# Patient Record
Sex: Male | Born: 1945 | Race: White | Hispanic: No | Marital: Married | Smoking: Current every day smoker
Health system: Southern US, Community
[De-identification: ages and names within clinical notes are randomized; demographics above are authoritative.]

## PROBLEM LIST (undated history)

## (undated) DIAGNOSIS — I10 Essential (primary) hypertension: Secondary | ICD-10-CM

## (undated) DIAGNOSIS — E1142 Type 2 diabetes mellitus with diabetic polyneuropathy: Secondary | ICD-10-CM

## (undated) DIAGNOSIS — N529 Male erectile dysfunction, unspecified: Secondary | ICD-10-CM

## (undated) DIAGNOSIS — I509 Heart failure, unspecified: Secondary | ICD-10-CM

## (undated) DIAGNOSIS — I517 Cardiomegaly: Secondary | ICD-10-CM

## (undated) DIAGNOSIS — J189 Pneumonia, unspecified organism: Secondary | ICD-10-CM

## (undated) DIAGNOSIS — J310 Chronic rhinitis: Secondary | ICD-10-CM

## (undated) DIAGNOSIS — E119 Type 2 diabetes mellitus without complications: Secondary | ICD-10-CM

## (undated) DIAGNOSIS — R7989 Other specified abnormal findings of blood chemistry: Secondary | ICD-10-CM

## (undated) DIAGNOSIS — E785 Hyperlipidemia, unspecified: Secondary | ICD-10-CM

## (undated) DIAGNOSIS — I420 Dilated cardiomyopathy: Secondary | ICD-10-CM

## (undated) DIAGNOSIS — R002 Palpitations: Secondary | ICD-10-CM

## (undated) DIAGNOSIS — K5900 Constipation, unspecified: Secondary | ICD-10-CM

## (undated) DIAGNOSIS — I2510 Atherosclerotic heart disease of native coronary artery without angina pectoris: Secondary | ICD-10-CM

## (undated) HISTORY — DX: Heart failure, unspecified: I50.9

## (undated) HISTORY — DX: Hyperlipidemia, unspecified: E78.5

## (undated) HISTORY — DX: Type 2 diabetes mellitus without complications: E11.9

## (undated) HISTORY — DX: Essential (primary) hypertension: I10

## (undated) HISTORY — PX: PILONIDAL CYST EXCISION: SHX744

## (undated) HISTORY — PX: APPENDECTOMY: SHX54

## (undated) HISTORY — DX: Chronic rhinitis: J31.0

## (undated) HISTORY — DX: Constipation, unspecified: K59.00

## (undated) HISTORY — PX: TONSILLECTOMY: SUR1361

---

## 1993-10-12 HISTORY — PX: URETHRA SURGERY: SHX824

## 1996-10-12 HISTORY — PX: HERNIA REPAIR: SHX51

## 2008-08-17 ENCOUNTER — Ambulatory Visit: Payer: Self-pay

## 2011-01-20 ENCOUNTER — Ambulatory Visit: Payer: Self-pay | Admitting: Internal Medicine

## 2011-02-10 ENCOUNTER — Ambulatory Visit: Payer: Self-pay | Admitting: Internal Medicine

## 2014-06-08 DIAGNOSIS — I10 Essential (primary) hypertension: Secondary | ICD-10-CM | POA: Insufficient documentation

## 2014-06-08 DIAGNOSIS — E119 Type 2 diabetes mellitus without complications: Secondary | ICD-10-CM | POA: Insufficient documentation

## 2014-06-08 DIAGNOSIS — I152 Hypertension secondary to endocrine disorders: Secondary | ICD-10-CM | POA: Insufficient documentation

## 2014-06-08 DIAGNOSIS — E785 Hyperlipidemia, unspecified: Secondary | ICD-10-CM | POA: Insufficient documentation

## 2014-06-08 DIAGNOSIS — E669 Obesity, unspecified: Secondary | ICD-10-CM | POA: Insufficient documentation

## 2014-06-08 DIAGNOSIS — E1169 Type 2 diabetes mellitus with other specified complication: Secondary | ICD-10-CM

## 2014-06-08 DIAGNOSIS — E1159 Type 2 diabetes mellitus with other circulatory complications: Secondary | ICD-10-CM

## 2014-06-08 HISTORY — DX: Essential (primary) hypertension: I10

## 2015-07-10 ENCOUNTER — Ambulatory Visit (INDEPENDENT_AMBULATORY_CARE_PROVIDER_SITE_OTHER): Payer: Medicare Other | Admitting: Urology

## 2015-07-10 ENCOUNTER — Encounter: Payer: Self-pay | Admitting: Urology

## 2015-07-10 VITALS — BP 159/98 | HR 92 | Ht 71.0 in | Wt 232.6 lb

## 2015-07-10 DIAGNOSIS — R3129 Other microscopic hematuria: Secondary | ICD-10-CM | POA: Insufficient documentation

## 2015-07-10 DIAGNOSIS — R972 Elevated prostate specific antigen [PSA]: Secondary | ICD-10-CM | POA: Insufficient documentation

## 2015-07-10 DIAGNOSIS — R312 Other microscopic hematuria: Secondary | ICD-10-CM

## 2015-07-10 NOTE — Progress Notes (Signed)
07/10/2015 4:19 PM   Casey Peters Apr 05, 1946 [id number]  Referring provider: No referring provider defined for this encounter.  Chief Complaint  Patient presents with  . Elevated PSA    referred by Yates Decamp MD    HPI: Patient is a 69 year old white male who is referred by his PCP, Dr. Dan Humphreys for a rising PSA.  Patient is also hypogonadal and is currently on Testim.  His PSA was 1.06 ng/mL was 06/08/2014 and now it has risen to 3.17 ng/mL on 06/27/2015.   Patient has filled out and I PSS score sheet and has scored a 28/4. When I asked him how long he's been having these urinary symptoms, he stated that ever since his last urethral reconstruction surgery 22 years ago in [place].    He complains of frequent urination, urgency nocturia, leakage, hesitancy and a weak urinary stream.   He has not had any gross hematuria, but he states he's been told he's had blood in his urine under the microscope. When I reviewed the PCP's records, he did have 4-10 RBC's per high-power field on his visit on 07/02/2015.  He is a former smoker. He does not have a history of kidney stones, UTI's or GU malignancies.  He denies any family history of prostate cancer, bladder cancer or kidney cancer.  He is currently not experiencing dysuria or suprapubic pain. He also denies any fevers, chills, nausea or vomiting. He has not had any bone pain, weight loss or appetite loss.      IPSS      07/10/15 1400       International Prostate Symptom Score   How often have you had the sensation of not emptying your bladder? Almost always     How often have you had to urinate less than every two hours? Almost always     How often have you found you stopped and started again several times when you urinated? Almost always     How often have you found it difficult to postpone urination? About half the time     How often have you had a weak urinary stream? Almost always     How often have you had to strain to  start urination? Not at All     How many times did you typically get up at night to urinate? 5 Times     Total IPSS Score 28     Quality of Life due to urinary symptoms   If you were to spend the rest of your life with your urinary condition just the way it is now how would you feel about that? Mostly Disatisfied        Score:  1-7 Mild 8-19 Moderate 20-35 Severe  PMH: Past Medical History  Diagnosis Date  . Diabetes   . HLD (hyperlipidemia)   . HTN (hypertension)   . Constipation   . Rhinitis     Surgical History: Past Surgical History  Procedure Laterality Date  . Urethra surgery  1995    Home Medications:    Medication List       This list is accurate as of: 07/10/15  4:19 PM.  Always use your most recent med list.               amLODipine 5 MG tablet  Commonly known as:  NORVASC  Take by mouth.     benazepril-hydrochlorthiazide 20-12.5 MG tablet  Commonly known as:  LOTENSIN HCT  Take 2 tablets  by mouth daily.     ciprofloxacin 250 MG tablet  Commonly known as:  CIPRO  Take by mouth.     CRESTOR 5 MG tablet  Generic drug:  rosuvastatin  TAKE 1 TABLET BY MOUTH ONCE A DAY     doxycycline 100 MG capsule  Commonly known as:  VIBRAMYCIN  TAKE 1 CAPSULE (100 MG TOTAL) BY MOUTH 2 (TWO) TIMES DAILY.     fenofibrate 160 MG tablet  TAKE 1 TABLET BY MOUTH ONCE A DAY     metFORMIN 500 MG tablet  Commonly known as:  GLUCOPHAGE  TAKE 1 TABLET BY MOUTH TWICE A DAY     metoprolol succinate 50 MG 24 hr tablet  Commonly known as:  TOPROL-XL  1 tab po daily for tremor     testosterone 50 MG/5GM (1%) Gel  Commonly known as:  ANDROGEL  Apply topically.        Allergies: No Known Allergies  Family History: Family History  Problem Relation Age of Onset  . Hematuria Father   . Kidney disease Neg Hx   . Prostate cancer Neg Hx     Social History:  reports that he has quit smoking. He does not have any smokeless tobacco history on file. He reports that  he does not drink alcohol or use illicit drugs.  ROS: UROLOGY Frequent Urination?: Yes Hard to postpone urination?: Yes Burning/pain with urination?: No Get up at night to urinate?: Yes Leakage of urine?: Yes Urine stream starts and stops?: Yes Trouble starting stream?: No Do you have to strain to urinate?: No Blood in urine?: No Urinary tract infection?: No Sexually transmitted disease?: No Injury to kidneys or bladder?: No Painful intercourse?: No Weak stream?: Yes Erection problems?: No Penile pain?: No  Gastrointestinal Nausea?: No Vomiting?: No Indigestion/heartburn?: No Diarrhea?: No Constipation?: Yes  Constitutional Fever: No Night sweats?: No Weight loss?: No Fatigue?: No  Skin Skin rash/lesions?: No Itching?: No  Eyes Blurred vision?: No Double vision?: No  Ears/Nose/Throat Sore throat?: No Sinus problems?: Yes  Hematologic/Lymphatic Swollen glands?: No Easy bruising?: No  Cardiovascular Leg swelling?: No Chest pain?: No  Respiratory Cough?: No Shortness of breath?: No  Endocrine Excessive thirst?: No  Musculoskeletal Back pain?: No Joint pain?: No  Neurological Headaches?: No Dizziness?: No  Psychologic Depression?: No Anxiety?: No  Physical Exam: BP 159/98 mmHg  Pulse 92  Ht  (1.803 m)  Wt 232 lb 9.6 oz (105.507 kg)  BMI 32.46 kg/m2  Constitutional:  Alert and oriented, No acute distress. HEENT: [place] AT, moist mucus membranes.  Trachea midline, no masses. Cardiovascular: No clubbing, cyanosis, or edema. Respiratory: Normal respiratory effort, no increased work of breathing. GI: Abdomen is soft, nontender, nondistended, no abdominal masses GU: No CVA tenderness. Patient with circumcised phallus.   Patient has had multiple urethral reconstructions.  No penile discharge. No penile lesions or rashes. Scrotum without lesions, cysts, rashes and/or edema.  Testicles are located scrotally bilaterally. No masses are  appreciated in the testicles. Left and right epididymis are normal. Rectal: Patient with  normal sphincter tone. Perineum without scarring or rashes. No rectal masses are appreciated. Prostate is approximately 45 grams, no nodules are appreciated. Seminal vesicles are normal. Skin: No rashes, bruises or suspicious lesions. Lymph: No cervical or inguinal adenopathy. Neurologic: Grossly intact, no focal deficits, moving all 4 extremities. Psychiatric: Normal mood and affect.  Laboratory Data:  Serum creatinine:   1.0 on 07/02/2015   Assessment & Plan:    1. Rising  PSA:   Patient's PSA has risen from 1.06 ng/mL on 06/08/2014 to 3.17 ng/mL on 06/27/2015.  We will repeat the PSA today.  If it remains elevated, we will pursue a biopsy.  This is a precipitous rise in a short period of time.  Patient is also on testosterone therapy and I have asked him to discontinue the Testim for now.  I did discuss the risks involved with a prostate biopsy, such as blood in urine, blood in stool, blood in semen, infection, urinary retention, and on rare occasions sepsis and death.    - PSA  2. Microscopic hematuria:   Patient had 4-10 RBC's/hpf at his PCP's office on 07/02/2015.  Explained to patient the causes of blood in the urine are as follows: stones, BPH, UTI's, damage to the urinary tract and/or cancer.   It is explained to the patient that they will be scheduled for a CT Urogram with contrast material and that in rare instances, an allergic reaction can be serious and even life threatening with the injection of contrast material.   The patient denies any allergies to contrast, iodine and/or seafood and is taking metformin.  Return for pending labs.  Michiel Cowboy, PA-C  [facility] [address] [place], [place] [address] [phone]

## 2015-07-11 ENCOUNTER — Telehealth: Payer: Self-pay

## 2015-07-11 LAB — PSA: Prostate Specific Ag, Serum: 2.1 ng/mL (ref 0.0–4.0)

## 2015-07-11 NOTE — Telephone Encounter (Signed)
-----   Message from Harle Battiest, PA-C sent at 07/11/2015  8:36 AM EDT ----- Patient's PSA has decreased, but it is still greater than a .75 rise within a year.  He can either have a biopsy at this time, especially if he wants to continue testosterone therapy, or repeat the PSA in three months and not apply the testosterone cream during that time.  He still needs the CT Urogram for the hematuria.

## 2015-07-11 NOTE — Telephone Encounter (Signed)
LMOM

## 2015-07-12 NOTE — Telephone Encounter (Signed)
-----   Message from Shannon A McGowan, PA-C sent at 07/11/2015  8:36 AM EDT ----- Patient's PSA has decreased, but it is still greater than a .75 rise within a year.  He can either have a biopsy at this time, especially if he wants to continue testosterone therapy, or repeat the PSA in three months and not apply the testosterone cream during that time.  He still needs the CT Urogram for the hematuria. 

## 2015-07-12 NOTE — Telephone Encounter (Signed)
LMOM

## 2015-07-15 ENCOUNTER — Telehealth: Payer: Self-pay | Admitting: *Deleted

## 2015-07-15 NOTE — Telephone Encounter (Signed)
Patient notified of results and has agreed to go ahead with prostate biopsy. He is going on a cruise and will be scheduled when he gets back. Patient was instructed not to restart testosterone at this time and would need to await biopsy results. He was given prostate biopsy instructions and verbalized understanding.  He will keep his CTscan apt for Friday

## 2015-07-15 NOTE — Telephone Encounter (Signed)
-----   Message from Shannon A McGowan, PA-C sent at 07/11/2015  8:36 AM EDT ----- Patient's PSA has decreased, but it is still greater than a .75 rise within a year.  He can either have a biopsy at this time, especially if he wants to continue testosterone therapy, or repeat the PSA in three months and not apply the testosterone cream during that time.  He still needs the CT Urogram for the hematuria. 

## 2015-07-15 NOTE — Telephone Encounter (Signed)
Pt returned Chelsea's call re: PSA results. Please return his call at [phone].

## 2015-07-19 ENCOUNTER — Ambulatory Visit
Admission: RE | Admit: 2015-07-19 | Discharge: 2015-07-19 | Disposition: A | Discharge status: Home / Self Care | Payer: Medicare Other | Source: Ambulatory Visit | Attending: Urology | Admitting: Urology

## 2015-07-19 DIAGNOSIS — R3129 Other microscopic hematuria: Secondary | ICD-10-CM | POA: Diagnosis present

## 2015-07-19 DIAGNOSIS — K760 Fatty (change of) liver, not elsewhere classified: Secondary | ICD-10-CM | POA: Insufficient documentation

## 2015-07-19 MED ORDER — IOHEXOL 300 MG/ML  SOLN
150.0000 mL | Freq: Once | INTRAMUSCULAR | Status: AC | PRN
  Administered 2015-07-19: 125 mL via INTRAVENOUS

## 2015-07-31 ENCOUNTER — Encounter: Payer: Self-pay | Admitting: Urology

## 2015-07-31 ENCOUNTER — Ambulatory Visit: Payer: Medicare Other | Admitting: Urology

## 2015-08-06 ENCOUNTER — Telehealth: Payer: Self-pay

## 2015-08-06 NOTE — Telephone Encounter (Signed)
Spoke with pt in reference to CT urogram and prostate bx. Pt stated he had his CT urogram a couple of weeks ago and then he has his prostate bx scheduled for Nov 1. -- Pt requested you give him a call.

## 2015-08-06 NOTE — Telephone Encounter (Signed)
-----   Message from Harle BattiestShannon A McGowan, PA-C sent at 08/06/2015 10:14 AM EDT ----- Patient has not had his CT urogram report nor has he scheduled his prostate biopsy.  Please make sure he does so.

## 2015-08-13 ENCOUNTER — Encounter: Payer: Self-pay | Admitting: Urology

## 2015-08-13 ENCOUNTER — Ambulatory Visit (INDEPENDENT_AMBULATORY_CARE_PROVIDER_SITE_OTHER): Payer: Medicare Other | Admitting: Urology

## 2015-08-13 ENCOUNTER — Other Ambulatory Visit: Payer: Self-pay | Admitting: Urology

## 2015-08-13 VITALS — BP 147/93 | HR 98 | Ht 71.0 in | Wt 229.9 lb

## 2015-08-13 DIAGNOSIS — R972 Elevated prostate specific antigen [PSA]: Secondary | ICD-10-CM | POA: Diagnosis not present

## 2015-08-13 MED ORDER — LEVOFLOXACIN 500 MG PO TABS
500.0000 mg | ORAL_TABLET | Freq: Once | ORAL | Status: AC
  Administered 2015-08-13: 500 mg via ORAL

## 2015-08-13 MED ORDER — GENTAMICIN SULFATE 40 MG/ML IJ SOLN
80.0000 mg | Freq: Once | INTRAMUSCULAR | Status: AC
  Administered 2015-08-13: 80 mg via INTRAMUSCULAR

## 2015-08-13 NOTE — Progress Notes (Signed)
Prostate Biopsy Procedure   Informed consent was obtained after discussing risks/benefits of the procedure.  A time out was performed to ensure correct patient identity. She's Pre-Procedure: - Last PSA Level: 2.1  - Gentamicin given prophylactically - Levaquin 500 mg administered PO -Transrectal Ultrasound performed revealing a 42 gm prostate -No significant hypoechoic or median lobe noted  Procedure: - Prostate block performed using 10 cc 1% lidocaine and biopsies taken from sextant areas, a total of 12 under ultrasound guidance.  Post-Procedure: - Patient tolerated the procedure well - He was 2 to seek immediate medical attention if experiences any severe pain, significant bleeding, or fevers - Return in one week to discuss biopsy results

## 2015-08-19 LAB — PATHOLOGY REPORT

## 2015-08-20 NOTE — Telephone Encounter (Signed)
-----   Message from Harle BattiestShannon A McGowan, PA-C sent at 08/20/2015 11:51 AM EST ----- Patient has not yet received his CT urogram report and he still needs to have cystoscopy to complete his hematuria workup.  He is scheduled to come in on the 10 th for his biopsy result.

## 2015-08-21 ENCOUNTER — Ambulatory Visit: Payer: Medicare Other

## 2015-08-22 ENCOUNTER — Ambulatory Visit (INDEPENDENT_AMBULATORY_CARE_PROVIDER_SITE_OTHER): Payer: Medicare Other | Admitting: Urology

## 2015-08-22 ENCOUNTER — Other Ambulatory Visit: Payer: Self-pay | Admitting: Urology

## 2015-08-22 ENCOUNTER — Encounter: Payer: Self-pay | Admitting: Urology

## 2015-08-22 VITALS — BP 137/83 | HR 91 | Temp 97.7°F | Ht 71.0 in | Wt 230.2 lb

## 2015-08-22 DIAGNOSIS — R3129 Other microscopic hematuria: Secondary | ICD-10-CM | POA: Diagnosis not present

## 2015-08-22 DIAGNOSIS — R3915 Urgency of urination: Secondary | ICD-10-CM | POA: Diagnosis not present

## 2015-08-22 LAB — URINALYSIS, COMPLETE
Bilirubin, UA: NEGATIVE
Glucose, UA: NEGATIVE
Ketones, UA: NEGATIVE
Nitrite, UA: NEGATIVE
Specific Gravity, UA: 1.02 (ref 1.005–1.030)
Urobilinogen, Ur: 0.2 mg/dL (ref 0.2–1.0)
pH, UA: 7 (ref 5.0–7.5)

## 2015-08-22 LAB — MICROSCOPIC EXAMINATION
RBC, UA: NONE SEEN /hpf (ref 0–?)
WBC, UA: >30 /hpf — ABNORMAL HIGH (ref 0–?)

## 2015-08-22 LAB — BLADDER SCAN AMB NON-IMAGING: Scan Result: 56

## 2015-08-22 MED ORDER — LIDOCAINE HCL 2 % EX GEL: 1.0000 "application " | Freq: Once | CUTANEOUS | Status: DC

## 2015-08-22 MED ORDER — CIPROFLOXACIN HCL 500 MG PO TABS: 500.0000 mg | ORAL_TABLET | Freq: Two times a day (BID) | ORAL | Status: DC

## 2015-08-22 MED ORDER — CIPROFLOXACIN HCL 500 MG PO TABS: 500.0000 mg | ORAL_TABLET | Freq: Once | ORAL | Status: DC

## 2015-08-22 NOTE — Progress Notes (Signed)
08/22/2015 12:03 PM   Casey Peters [birth year] [id number]  Referring provider: Rafael Bihari, MD [phone] [facility] [address] [facility] [place], [place] [address]  Chief Complaint  Patient presents with  . Cysto    microscopic hematuria, discuss biopsy results    HPI: Patient is a 69 year old white male who is referred by his PCP, Dr. Dan Humphreys for a rising PSA. Patient is also hypogonadal and is currently on Testim. His PSA was 1.06 ng/mL was 06/08/2014 and now it has risen to 3.17 ng/mL on 06/27/2015. Patient has filled out and I PSS score sheet and has scored a 28/4. When I asked him how long he's been having these urinary symptoms, he stated that ever since his last urethral reconstruction surgery 22 years ago in [place].   He complains of frequent urination, urgency nocturia, leakage, hesitancy and a weak urinary stream. He has not had any gross hematuria, but he states he's been told he's had blood in his urine under the microscope. When I reviewed the PCP's records, he did have 4-10 RBC's per high-power field on his visit on 07/02/2015. He is a former smoker. He does not have a history of kidney stones, UTI's or GU malignancies.  He denies any family history of prostate cancer, bladder cancer or kidney cancer. He is currently not experiencing dysuria or suprapubic pain. He also denies any fevers, chills, nausea or vomiting. He has not had any bone pain, weight loss or appetite loss.  Interval history: Patient presents today for review of his prostate biopsy results which were normal. He had a CT urogram which was unremarkable. He presents today for a cystoscopy to finish his microhematuria workup. However, he complains of new onset dysuria and frequency. His urinalysis has multiple bacteria. The patient states that he gets urinary tract infections every month. Also on further questioning of his previous urethral reconstruction, it sounds like the patient had  distal urethral reconstruction decades ago. 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    PMH: Past Medical History  Diagnosis Date  . Diabetes (HCC)   . HLD (hyperlipidemia)   . HTN (hypertension)   . Constipation   . Rhinitis   . BP (high blood pressure) 06/08/2014    Surgical History: Past Surgical History  Procedure Laterality Date  . Urethra surgery  1995    Home Medications:    Medication List       This list is accurate as of: 08/22/15 12:03 PM.  Always use your most recent med list.               amLODipine 5 MG tablet  Commonly known as:  NORVASC  Take by mouth.     benazepril-hydrochlorthiazide 20-12.5 MG tablet  Commonly known as:  LOTENSIN HCT  Take 2 tablets by mouth daily.     CRESTOR 5 MG tablet  Generic drug:  rosuvastatin  TAKE 1 TABLET BY MOUTH ONCE A DAY     doxycycline 100 MG capsule  Commonly known as:  VIBRAMYCIN  TAKE 1 CAPSULE (100 MG TOTAL) BY MOUTH 2 (TWO) TIMES DAILY.     fenofibrate 160 MG tablet  TAKE 1 TABLET BY MOUTH ONCE A DAY     metFORMIN 500 MG tablet  Commonly known as:  GLUCOPHAGE  TAKE 1 TABLET BY MOUTH TWICE A DAY     metoprolol succinate 50 MG 24 hr tablet  Commonly known  as:  TOPROL-XL  1 tab po daily for tremor     testosterone 50 MG/5GM (1%) Gel  Commonly known as:  ANDROGEL  Apply topically.        Allergies: No Known Allergies  Family History: Family History  Problem Relation Age of Onset  . Hematuria Father   . Kidney disease Neg Hx   . Prostate cancer Neg Hx     Social History:  reports that he has been smoking Cigars.  He does not have any smokeless tobacco history on file. He reports that he does not drink alcohol or use illicit drugs.  ROS: UROLOGY Frequent Urination?: Yes Hard to postpone urination?: Yes Burning/pain with urination?: Yes Get up at night to urinate?: Yes Leakage of urine?: No Urine stream starts and stops?:  Yes Trouble starting stream?: No Do you have to strain to urinate?: No Blood in urine?: No Urinary tract infection?: Yes Sexually transmitted disease?: No Injury to kidneys or bladder?: No Painful intercourse?: No Weak stream?: Yes Erection problems?: No Penile pain?: No  Gastrointestinal Nausea?: No Vomiting?: No Indigestion/heartburn?: No Diarrhea?: No Constipation?: Yes  Constitutional Fever: No Night sweats?: No Weight loss?: No Fatigue?: No  Skin Skin rash/lesions?: No Itching?: No  Eyes Blurred vision?: No Double vision?: No  Ears/Nose/Throat Sore throat?: No Sinus problems?: No  Hematologic/Lymphatic Swollen glands?: No Easy bruising?: No  Cardiovascular Leg swelling?: No Chest pain?: No  Respiratory Cough?: No Shortness of breath?: No  Endocrine Excessive thirst?: No  Musculoskeletal Back pain?: No Joint pain?: No  Neurological Headaches?: No Dizziness?: No  Psychologic Depression?: No Anxiety?: No  Physical Exam: BP 137/83 mmHg  Pulse 91  Temp(Src) 97.7 F (36.5 C) (Oral)  Ht  (1.803 m)  Wt 230 lb 3.2 oz (104.418 kg)  BMI 32.12 kg/m2  Constitutional:  Alert and oriented, No acute distress. HEENT: [place] AT, moist mucus membranes.  Trachea midline, no masses. Cardiovascular: No clubbing, cyanosis, or edema. Respiratory: Normal respiratory effort, no increased work of breathing. GI: Abdomen is soft, nontender, nondistended, no abdominal masses GU: No CVA tenderness.  Skin: No rashes, bruises or suspicious lesions. Lymph: No cervical or inguinal adenopathy. Neurologic: Grossly intact, no focal deficits, moving all 4 extremities. Psychiatric: Normal mood and affect.  Laboratory Data: No results found for: WBC, HGB, HCT, MCV, PLT  No results found for: CREATININE  Lab Results  Component Value Date   PSA 2.1 07/10/2015    No results found for: TESTOSTERONE  No results found for: HGBA1C  Urinalysis No results found  for: COLORURINE, APPEARANCEUR, LABSPEC, PHURINE, GLUCOSEU, HGBUR, BILIRUBINUR, KETONESUR, PROTEINUR, UROBILINOGEN, NITRITE, LEUKOCYTESUR  Pertinent Imaging: CLINICAL DATA: Microscopic hematuria intermittently for 1 year.  EXAM: CT ABDOMEN AND PELVIS WITHOUT AND WITH CONTRAST  TECHNIQUE: Multidetector CT imaging of the abdomen and pelvis was performed following the standard protocol before and following the bolus administration of intravenous contrast.  CONTRAST: OMNIPAQUE IOHEXOL 300 MG/ML SOLN  COMPARISON: None.  FINDINGS: Lower chest: The lung bases are clear of acute process. There is left basilar scarring and possible subpleural atelectasis. No worrisome pulmonary lesions or pleural effusion. The heart is normal in size. No pericardial effusion. The distal esophagus is grossly normal.  Hepatobiliary: Mild diffuse fatty infiltration of the liver but no focal hepatic lesions or intrahepatic biliary dilatation. The gallbladder is normal. No common bile duct dilatation.  Pancreas: No mass, inflammation or ductal dilatation.  Spleen: Normal size. No focal lesions.  Adrenals/Urinary Tract: The adrenal glands are normal.  No renal, ureteral or bladder calculi. Both kidneys demonstrate normal enhancement/ perfusion. No worrisome renal lesions. The delayed images do not demonstrate any significant collecting system abnormalities. Both ureters are normal. The bladder is normal. No bladder mass or wall thickening.  Scattered prostate calcifications. Mild median lobe hypertrophy impressing on the base of the bladder. The seminal vesicles are normal.  Stomach/Bowel: The stomach, duodenum, small bowel and colon are unremarkable. No inflammatory changes, mass lesions or obstructive findings.  Vascular/Lymphatic: No mesenteric or retroperitoneal mass or adenopathy. The aorta and branch vessels are patent. The major venous structures are patent.  Other:  No pelvic mass or adenopathy. Small scattered pelvic lymph nodes are noted. No free pelvic fluid collections. No inguinal mass or adenopathy.  Musculoskeletal: No significant bony findings. A few small scattered sclerotic lesions are likely benign bone islands.  IMPRESSION: 1. No CT findings to account for the patient's microhematuria. No renal, ureteral or bladder calculi or mass. 2. Mild median lobe hypertrophy of the prostate gland. 3. No acute abdominal/pelvic findings, mass lesions or lymphadenopathy. 4. Mild diffuse fatty infiltration of the liver.  PVR: 56 cc  Assessment & Plan:    1. Elevated PSA (rising whille on Testim) The patient had a normal biopsy of his prostate. He will follow-up in 6 months for PSA.  2. Microscopic hematuria  The patient has a negative CT urogram. However, today he is complaining of dysuria and has a large amount of bacteria in his urine. We will send a urine culture and have him follow-up for cystoscopy after confirming negative urine culture.  3.  Recurrent urinary tract infection We'll prophylactically start the patient on ciprofloxacin 500 mg twice a day for 1 week. If his culture grows no bacteria or gross bacteria resistant to Cipro, we will instruct the patient on changing or stopping the medication based on urine culture results  4. Hypogonadism The patient continue his treatment with Testim via his primary care provider.   Return in about 2 weeks (around 09/05/2015) for cystoscopy.  Hildred LaserBrian James Analie Katzman, MD  [facility] [address], [address] [place], [place] [id number] [phone]

## 2015-08-22 NOTE — Progress Notes (Signed)
Bladder Scan Patient void: 56 ml Performed By: Theotis BurrowK.Russell,CMA

## 2015-08-25 LAB — CULTURE, URINE COMPREHENSIVE

## 2015-08-28 ENCOUNTER — Other Ambulatory Visit: Payer: Medicare Other | Admitting: Urology

## 2015-08-29 ENCOUNTER — Telehealth: Payer: Self-pay

## 2015-08-29 DIAGNOSIS — N390 Urinary tract infection, site not specified: Secondary | ICD-10-CM

## 2015-08-29 MED ORDER — SULFAMETHOXAZOLE-TRIMETHOPRIM 800-160 MG PO TABS: 1.0000 | ORAL_TABLET | Freq: Two times a day (BID) | ORAL | Status: AC

## 2015-08-29 NOTE — Telephone Encounter (Signed)
Please start Casey Peters on Bactrim DS BID for a week and let him know his culture was resistant to cipro. Bb   LMOM- medication sent pharmacy

## 2015-08-29 NOTE — Telephone Encounter (Signed)
Pt notified to stop taking Cipro & start Bactrim. Prescription sent to pharmacy. Pt voices understanding.

## 2015-09-11 ENCOUNTER — Other Ambulatory Visit: Payer: Medicare Other

## 2015-09-11 ENCOUNTER — Ambulatory Visit (INDEPENDENT_AMBULATORY_CARE_PROVIDER_SITE_OTHER): Payer: Medicare Other | Admitting: Urology

## 2015-09-11 VITALS — BP 158/95 | HR 99 | Ht 71.0 in | Wt 232.0 lb

## 2015-09-11 DIAGNOSIS — R972 Elevated prostate specific antigen [PSA]: Secondary | ICD-10-CM

## 2015-09-11 DIAGNOSIS — R3129 Other microscopic hematuria: Secondary | ICD-10-CM | POA: Diagnosis not present

## 2015-09-11 DIAGNOSIS — N390 Urinary tract infection, site not specified: Secondary | ICD-10-CM | POA: Diagnosis not present

## 2015-09-11 LAB — URINALYSIS, COMPLETE
Bilirubin, UA: NEGATIVE
Ketones, UA: NEGATIVE
Nitrite, UA: NEGATIVE
Protein, UA: NEGATIVE
Specific Gravity, UA: 1.015 (ref 1.005–1.030)
Urobilinogen, Ur: 1 mg/dL (ref 0.2–1.0)
pH, UA: 7 (ref 5.0–7.5)

## 2015-09-11 LAB — MICROSCOPIC EXAMINATION

## 2015-09-11 MED ORDER — CIPROFLOXACIN HCL 500 MG PO TABS
500.0000 mg | ORAL_TABLET | Freq: Once | ORAL | Status: AC
  Administered 2015-09-11: 500 mg via ORAL

## 2015-09-11 MED ORDER — LIDOCAINE HCL 2 % EX GEL
1.0000 "application " | Freq: Once | CUTANEOUS | Status: AC
  Administered 2015-09-11: 1 via URETHRAL

## 2015-09-11 NOTE — Progress Notes (Signed)
09/11/2015 2:59 PM   Casey Peters 02/27/1946 [id number]  Referring provider: Rafael Bihari, MD [phone] [facility] [address] [facility] [place], [place] [address]  Chief Complaint  Patient presents with  . Cysto    HPI: Patient is a 69 year old white male who is referred by his PCP, Dr. Dan Humphreys for a rising PSA. Patient is also hypogonadal and is currently on Testim. His PSA was 1.06 ng/mL was 06/08/2014 and now it has risen to 3.17 ng/mL on 06/27/2015. Patient has filled out and I PSS score sheet and has scored a 28/4. When I asked him how long he's been having these urinary symptoms, he stated that ever since his last urethral reconstruction surgery 22 years ago in [place].   He complains of frequent urination, urgency nocturia, leakage, hesitancy and a weak urinary stream. He has not had any gross hematuria, but he states he's been told he's had blood in his urine under the microscope. When I reviewed the PCP's records, he did have 4-10 RBC's per high-power field on his visit on 07/02/2015. He is a former smoker. He does not have a history of kidney stones, UTI's or GU malignancies.  He denies any family history of prostate cancer, bladder cancer or kidney cancer. He is currently not experiencing dysuria or suprapubic pain. He also denies any fevers, chills, nausea or vomiting. He has not had any bone pain, weight loss or appetite loss.  Interval history: Patient presents today for completion of his microhematuria work up. He had a CT urogram which was unremarkable.   He recently had an Escherichia coli UTI resistant to Cipro was properly treated with ciprofloxacin.  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   PMH: Past Medical History  Diagnosis Date  . Diabetes (HCC)   . HLD (hyperlipidemia)   . HTN (hypertension)   . Constipation   . Rhinitis   . BP (high blood pressure) 06/08/2014    Surgical History: Past  Surgical History  Procedure Laterality Date  . Urethra surgery  1995    Home Medications:    Medication List       This list is accurate as of: 09/11/15  2:59 PM.  Always use your most recent med list.               amLODipine 5 MG tablet  Commonly known as:  NORVASC  Take by mouth.     benazepril-hydrochlorthiazide 20-12.5 MG tablet  Commonly known as:  LOTENSIN HCT  Take 2 tablets by mouth daily.     CRESTOR 5 MG tablet  Generic drug:  rosuvastatin  TAKE 1 TABLET BY MOUTH ONCE A DAY     doxycycline 100 MG capsule  Commonly known as:  VIBRAMYCIN  TAKE 1 CAPSULE (100 MG TOTAL) BY MOUTH 2 (TWO) TIMES DAILY.     fenofibrate 160 MG tablet  TAKE 1 TABLET BY MOUTH ONCE A DAY     metFORMIN 500 MG tablet  Commonly known as:  GLUCOPHAGE  TAKE 1 TABLET BY MOUTH TWICE A DAY     metoprolol succinate 50 MG 24 hr tablet  Commonly known as:  TOPROL-XL  1 tab po daily for tremor     testosterone 50 MG/5GM (1%) Gel  Commonly known as:  ANDROGEL  Apply topically.        Allergies: No Known Allergies  Family History: Family History  Problem Relation Age of Onset  .  Hematuria Father   . Kidney disease Neg Hx   . Prostate cancer Neg Hx     Social History:  reports that he has been smoking Cigars.  He does not have any smokeless tobacco history on file. He reports that he does not drink alcohol or use illicit drugs.  ROS:                                        Physical Exam: BP 158/95 mmHg  Pulse 99  Ht  (1.803 m)  Wt 232 lb (105.235 kg)  BMI 32.37 kg/m2  Constitutional:  Alert and oriented, No acute distress. HEENT: [place] AT, moist mucus membranes.  Trachea midline, no masses. Cardiovascular: No clubbing, cyanosis, or edema. Respiratory: Normal respiratory effort, no increased work of breathing. GI: Abdomen is soft, nontender, nondistended, no abdominal masses GU: No CVA tenderness.  Skin: No rashes, bruises or suspicious  lesions. Lymph: No cervical or inguinal adenopathy. Neurologic: Grossly intact, no focal deficits, moving all 4 extremities. Psychiatric: Normal mood and affect.  Laboratory Data: No results found for: WBC, HGB, HCT, MCV, PLT  No results found for: CREATININE  Lab Results  Component Value Date   PSA 2.1 07/10/2015    No results found for: TESTOSTERONE  No results found for: HGBA1C  Urinalysis    Component Value Date/Time   GLUCOSEU Negative 08/22/2015 1144   BILIRUBINUR Negative 08/22/2015 1144   NITRITE Negative 08/22/2015 1144   LEUKOCYTESUR 3+* 08/22/2015 1144    Pertinent Imaging: IMPRESSION: 1. No CT findings to account for the patient's microhematuria. No renal, ureteral or bladder calculi or mass. 2. Mild median lobe hypertrophy of the prostate gland. 3. No acute abdominal/pelvic findings, mass lesions or lymphadenopathy. 4. Mild diffuse fatty infiltration of the liver.     Cystoscopy Procedure Note  Patient identification was confirmed, informed consent was obtained, and patient was prepped using Betadine solution.  Lidocaine jelly was administered per urethral meatus.    Preoperative abx where received prior to procedure.     Pre-Procedure: - Inspection reveals a normal caliber ureteral meatus.  Procedure: The flexible cystoscope was introduced without difficulty - The patient has a sore urethra stricture that wide caliber that the cystoscope was able to be passed through. - Normal prostate  - Normal bladder neck - Bilateral ureteral orifices identified - Bladder mucosa  reveals no ulcers, tumors, or lesions - No bladder stones - No trabeculation  Retroflexion shows no intravesical lobe   Post-Procedure: - Patient tolerated the procedure well   Assessment & Plan:    1. Elevated PSA (rising whille on Testim) The patient had a normal biopsy of his prostate. He will follow-up in 6 months for PSA.  2. Microscopic hematuria The patient Had  a negative hematuria workup. He will need a repeat urinalysis in one year.  3. Recurrent urinary tract infection The patient was instructed to contact the office if he has another symptomatically UTI. He continues symptomatic UTIs, we may need to consider antibiotic prophylaxis.  4. Hypogonadism The patient continue his treatment with Testim via his primary care provider.  5. 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  6.  History of BXO The patient does not have symptoms or signs of active disease at this time. We will continue to monitor  his symptoms.   Return in about 6 months (around 03/10/2016) for with PSA prior.  Hildred LaserBrian James Aritzel Krusemark, MD  [facility] [address], [address] [place], [place] [address] [phone]

## 2016-03-03 ENCOUNTER — Other Ambulatory Visit: Payer: Medicare Other

## 2016-03-03 DIAGNOSIS — R972 Elevated prostate specific antigen [PSA]: Secondary | ICD-10-CM

## 2016-03-04 LAB — PSA: Prostate Specific Ag, Serum: 1.4 ng/mL (ref 0.0–4.0)

## 2016-03-10 ENCOUNTER — Ambulatory Visit (INDEPENDENT_AMBULATORY_CARE_PROVIDER_SITE_OTHER): Payer: Medicare Other | Admitting: Urology

## 2016-03-10 VITALS — BP 134/84 | HR 78 | Ht 72.0 in | Wt 226.7 lb

## 2016-03-10 DIAGNOSIS — R3129 Other microscopic hematuria: Secondary | ICD-10-CM | POA: Diagnosis not present

## 2016-03-10 DIAGNOSIS — R972 Elevated prostate specific antigen [PSA]: Secondary | ICD-10-CM

## 2016-03-10 DIAGNOSIS — N390 Urinary tract infection, site not specified: Secondary | ICD-10-CM | POA: Diagnosis not present

## 2016-03-10 LAB — URINALYSIS, COMPLETE
Bilirubin, UA: NEGATIVE
Glucose, UA: NEGATIVE
Ketones, UA: NEGATIVE
Nitrite, UA: NEGATIVE
Protein, UA: NEGATIVE
Specific Gravity, UA: 1.02 (ref 1.005–1.030)
Urobilinogen, Ur: 1 mg/dL (ref 0.2–1.0)
pH, UA: 6.5 (ref 5.0–7.5)

## 2016-03-10 LAB — MICROSCOPIC EXAMINATION: Bacteria, UA: NONE SEEN

## 2016-03-10 NOTE — Progress Notes (Signed)
03/10/2016 9:37 AM   Casey Peters Nov 18, 1945 [id number]  Referring provider: Madelyn Brunner, MD [place] [facility] [place], [place] [address]  Chief Complaint  Patient presents with  . Follow-up    elevated PSA, recurrent UTI    HPI: Casey Peters is a 70yo seen in followup for elevated PSA and microhematuria.  He had gross hematuria after the cystoscopy but his LUTS have improved. He has moderate LUTS including nocturia 2-3x  PSA 1.4 today. 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  UA today shows 3-10 RBCs/hpf     PMH: Past Medical History  Diagnosis Date  . Diabetes ([place])   . HLD (hyperlipidemia)   . HTN (hypertension)   . Constipation   . Rhinitis   . BP (high blood pressure) 06/08/2014    Surgical History: Past Surgical History  Procedure Laterality Date  . Urethra surgery  1995    Home Medications:    Medication List       This list is accurate as of: 03/10/16  9:37 AM.  Always use your most recent med list.               amLODipine 5 MG tablet  Commonly known as:  NORVASC  Take by mouth.     benazepril-hydrochlorthiazide 20-12.5 MG tablet  Commonly known as:  LOTENSIN HCT  Take 2 tablets by mouth daily.     CRESTOR 5 MG tablet  Generic drug:  rosuvastatin  TAKE 1 TABLET BY MOUTH ONCE A DAY     fenofibrate 160 MG tablet  TAKE 1 TABLET BY MOUTH ONCE A DAY     FIFTY50 GLUCOSE METER 2.0 w/Device Kit     metFORMIN 500 MG tablet  Commonly known as:  GLUCOPHAGE  TAKE 1 TABLET BY MOUTH TWICE A DAY     metoprolol succinate 50 MG 24 hr tablet  Commonly known as:  TOPROL-XL  1 tab po daily for tremor     testosterone 50 MG/5GM (1%) Gel  Commonly known as:  ANDROGEL  Apply topically.        Allergies: No Known Allergies  Family History: Family History  Problem Relation Age of Onset  . Hematuria Father   . Kidney disease Neg Hx   . Prostate cancer Neg Hx     Social History:  reports that he has been smoking  Cigars.  He does not have any smokeless tobacco history on file. He reports that he does not drink alcohol or use illicit drugs.  ROS: UROLOGY Frequent Urination?: Yes Hard to postpone urination?: Yes Burning/pain with urination?: No Get up at night to urinate?: Yes Leakage of urine?: No Urine stream starts and stops?: Yes Trouble starting stream?: No Do you have to strain to urinate?: No Blood in urine?: No Urinary tract infection?: No Sexually transmitted disease?: No Injury to kidneys or bladder?: No Painful intercourse?: No Weak stream?: No Erection problems?: No Penile pain?: No  Gastrointestinal Nausea?: No Vomiting?: No Indigestion/heartburn?: No Diarrhea?: No Constipation?: No  Constitutional Fever: No Night sweats?: No Weight loss?: No Fatigue?: No  Skin Skin rash/lesions?: No Itching?: No  Eyes Blurred vision?: No Double vision?: No  Ears/Nose/Throat Sore throat?: No Sinus problems?: No  Hematologic/Lymphatic Swollen glands?: No Easy bruising?: No  Cardiovascular Leg swelling?: No Chest pain?: No  Respiratory Cough?: No Shortness of breath?: No  Endocrine Excessive thirst?: No  Musculoskeletal Back pain?: Yes Joint pain?: No  Neurological Headaches?: No Dizziness?: No  Psychologic Depression?:  No Anxiety?: No  Physical Exam: BP 134/84 mmHg  Pulse 78  Ht 6' (1.829 m)  Wt 102.83 kg (226 lb 11.2 oz)  BMI 30.74 kg/m2  Constitutional:  Alert and oriented, No acute distress. HEENT: [place] AT, moist mucus membranes.  Trachea midline, no masses. Cardiovascular: No clubbing, cyanosis, or edema. Respiratory: Normal respiratory effort, no increased work of breathing. GI: Abdomen is soft, nontender, nondistended, no abdominal masses GU: No CVA tenderness. Prostate 40g smooth, no induration, no nodules. Normal rectal tone Skin: No rashes, bruises or suspicious lesions. Lymph: No cervical or inguinal adenopathy. Neurologic: Grossly  intact, no focal deficits, moving all 4 extremities. Psychiatric: Normal mood and affect.  Laboratory Data: No results found for: WBC, HGB, HCT, MCV, PLT  No results found for: CREATININE  No results found for: PSA  No results found for: TESTOSTERONE  No results found for: HGBA1C  Urinalysis    Component Value Date/Time   APPEARANCEUR Clear 09/11/2015 1428   GLUCOSEU 1+* 09/11/2015 1428   BILIRUBINUR Negative 09/11/2015 1428   PROTEINUR Negative 09/11/2015 1428   NITRITE Negative 09/11/2015 1428   LEUKOCYTESUR 1+* 09/11/2015 1428    Pertinent Imaging: none  Assessment & Plan:   1. Rising PSA level -recheck PSA in 6 months  2. Microscopic hematuria -recheck UA in 6 months - Urinalysis, Complete     No Follow-up on file.  Nicolette Bang, MD  [facility] [address] [place], [place] [address] [phone]

## 2016-07-03 ENCOUNTER — Other Ambulatory Visit: Payer: Self-pay

## 2016-09-07 ENCOUNTER — Other Ambulatory Visit: Payer: Medicare Other

## 2016-09-10 ENCOUNTER — Ambulatory Visit: Payer: Medicare Other

## 2016-10-09 ENCOUNTER — Ambulatory Visit: Payer: Medicare Other

## 2016-10-28 ENCOUNTER — Ambulatory Visit: Payer: Medicare Other

## 2016-11-17 ENCOUNTER — Encounter: Payer: Self-pay | Admitting: Urology

## 2016-11-17 ENCOUNTER — Ambulatory Visit (INDEPENDENT_AMBULATORY_CARE_PROVIDER_SITE_OTHER): Payer: Medicare Other | Admitting: Urology

## 2016-11-17 VITALS — BP 148/84 | HR 83 | Ht 72.0 in | Wt 226.1 lb

## 2016-11-17 DIAGNOSIS — R350 Frequency of micturition: Secondary | ICD-10-CM

## 2016-11-17 DIAGNOSIS — R3129 Other microscopic hematuria: Secondary | ICD-10-CM | POA: Diagnosis not present

## 2016-11-17 DIAGNOSIS — R972 Elevated prostate specific antigen [PSA]: Secondary | ICD-10-CM

## 2016-11-17 LAB — URINALYSIS, COMPLETE
Bilirubin, UA: NEGATIVE
Glucose, UA: NEGATIVE
Ketones, UA: NEGATIVE
Nitrite, UA: NEGATIVE
Specific Gravity, UA: 1.02 (ref 1.005–1.030)
Urobilinogen, Ur: 0.2 mg/dL (ref 0.2–1.0)
pH, UA: 7 (ref 5.0–7.5)

## 2016-11-17 LAB — MICROSCOPIC EXAMINATION: Bacteria, UA: NONE SEEN

## 2016-11-17 NOTE — Progress Notes (Signed)
11/17/2016 9:00 AM   Casey Peters 1946/07/04 [id number]  Referring provider: Madelyn Brunner, MD [facility] [facility] [place], [place] [address]  No chief complaint on file.   HPI: Casey Peters is a 71yo seen in followup for elevated PSA and microhematuria.  He had gross hematuria after the cystoscopy but his LUTS have improved done for UTI/MH work-up. CT Oct 2016 and cysto Nov 2016.   H/o BPH with moderate LUTS including nocturia 2-3x.   His PSA rose to 3.14 Jun 2015 and there was concern. His PSA was 1.13 Feb 2016 with a normal DRE and 2.1 in 2016. At PCP/[facility] his PSA was 2.12 Jun 2016. No FH of PCa.   He has hypogonadism on testim with Dr. Gilford Rile.   He's well today. No bothersome LUTS. No gross hematuria. UA today shows 3-10 rbc's. No flank pain.     PMH: Past Medical History:  Diagnosis Date  . BP (high blood pressure) 06/08/2014  . Constipation   . Diabetes ([place])   . HLD (hyperlipidemia)   . HTN (hypertension)   . Rhinitis     Surgical History: Past Surgical History:  Procedure Laterality Date  . URETHRA SURGERY  1995    Home Medications:  Allergies as of 11/17/2016   No Known Allergies     Medication List       Accurate as of 11/17/16  9:00 AM. Always use your most recent med list.          amLODipine 5 MG tablet Commonly known as:  NORVASC Take by mouth.   benazepril-hydrochlorthiazide 20-12.5 MG tablet Commonly known as:  LOTENSIN HCT Take 2 tablets by mouth daily.   CRESTOR 5 MG tablet Generic drug:  rosuvastatin TAKE 1 TABLET BY MOUTH ONCE A DAY   fenofibrate 160 MG tablet TAKE 1 TABLET BY MOUTH ONCE A DAY   FIFTY50 GLUCOSE METER 2.0 w/Device Kit   metFORMIN 500 MG tablet Commonly known as:  GLUCOPHAGE TAKE 1 TABLET BY MOUTH TWICE A DAY   metoprolol succinate 50 MG 24 hr tablet Commonly known as:  TOPROL-XL 1 tab po daily for tremor   testosterone 50 MG/5GM (1%) Gel Commonly known as:  ANDROGEL Apply  topically.       Allergies: No Known Allergies  Family History: Family History  Problem Relation Age of Onset  . Hematuria Father   . Kidney disease Neg Hx   . Prostate cancer Neg Hx     Social History:  reports that he has been smoking Cigars.  He does not have any smokeless tobacco history on file. He reports that he does not drink alcohol or use drugs.  ROS:                                        Physical Exam: There were no vitals taken for this visit.  Constitutional:  Alert and oriented, No acute distress. HEENT: [place] AT, moist mucus membranes.  Trachea midline, no masses. Cardiovascular: No clubbing, cyanosis, or edema. Respiratory: Normal respiratory effort, no increased work of breathing. GI: Abdomen is soft, nontender, nondistended, no abdominal masses Skin: No rashes, bruises or suspicious lesions. Neurologic: Grossly intact, no focal deficits, moving all 4 extremities. Psychiatric: Normal mood and affect.  Laboratory Data: No results found for: WBC, HGB, HCT, MCV, PLT  No results found for: CREATININE  No results found  for: PSA  No results found for: TESTOSTERONE  No results found for: HGBA1C  Urinalysis    Component Value Date/Time   APPEARANCEUR Clear 03/10/2016 0925   GLUCOSEU Negative 03/10/2016 0925   BILIRUBINUR Negative 03/10/2016 0925   PROTEINUR Negative 03/10/2016 0925   NITRITE Negative 03/10/2016 0925   LEUKOCYTESUR 1+ (A) 03/10/2016 0925     Assessment & Plan:    1) MH - negative evaluation. No flank pain or gross hematuria - will follow.   2) BPH/PCa screening - PSa stable. DRE was normal earlier this year.   See in 1 year for U/A, DRE and review PSA's.   There are no diagnoses linked to this encounter.  No Follow-up on file.  Festus Aloe, [facility] [address], [place], [place] [address] [phone]

## 2017-03-17 ENCOUNTER — Encounter: Payer: Self-pay | Admitting: Urology

## 2017-03-17 ENCOUNTER — Ambulatory Visit (INDEPENDENT_AMBULATORY_CARE_PROVIDER_SITE_OTHER): Payer: Medicare Other | Admitting: Urology

## 2017-03-17 VITALS — BP 140/86 | HR 94 | Ht 72.0 in | Wt 225.8 lb

## 2017-03-17 DIAGNOSIS — R300 Dysuria: Secondary | ICD-10-CM | POA: Diagnosis not present

## 2017-03-17 DIAGNOSIS — N401 Enlarged prostate with lower urinary tract symptoms: Secondary | ICD-10-CM | POA: Diagnosis not present

## 2017-03-17 DIAGNOSIS — R351 Nocturia: Secondary | ICD-10-CM | POA: Diagnosis not present

## 2017-03-17 DIAGNOSIS — N138 Other obstructive and reflux uropathy: Secondary | ICD-10-CM | POA: Diagnosis not present

## 2017-03-17 LAB — URINALYSIS, COMPLETE
Bilirubin, UA: NEGATIVE
Glucose, UA: NEGATIVE
Ketones, UA: NEGATIVE
Nitrite, UA: NEGATIVE
Specific Gravity, UA: 1.025 (ref 1.005–1.030)
Urobilinogen, Ur: 0.2 mg/dL (ref 0.2–1.0)
pH, UA: 6 (ref 5.0–7.5)

## 2017-03-17 LAB — MICROSCOPIC EXAMINATION

## 2017-03-17 LAB — BLADDER SCAN AMB NON-IMAGING: Scan Result: 0

## 2017-03-17 MED ORDER — TAMSULOSIN HCL 0.4 MG PO CAPS: 0.4000 mg | ORAL_CAPSULE | Freq: Every day | ORAL | 11 refills | Status: DC

## 2017-03-17 MED ORDER — CIPROFLOXACIN HCL 500 MG PO TABS: 500.0000 mg | ORAL_TABLET | Freq: Two times a day (BID) | ORAL | 0 refills | Status: AC

## 2017-03-17 NOTE — Progress Notes (Signed)
03/17/2017 10:28 AM   Casey Peters 10/05/1946 [id number]  Referring provider: Rafael Bihari, MD [phone] [facility] [address] [facility] [place], [place] [address]  Chief Complaint  Patient presents with  . Dysuria    x2 weeks    HPI:  Patient with h/o microhematuria, LUTS and low T. He had gross hematuria after the cystoscopy Nov 2016 but his LUTS improved. 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 CT was done Oct 2016. Prostate was about 65 grams. He has moderate LUTS including nocturia 2-3x  PSA 1.13 Feb 2016 (was up to 3.1). Prostate biopsy Nov 2016 benign with 42 g prostate.  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  UA today shows 3-10 RBCs/hpf     Today, patient follows up today with worsening urinary symptoms. He complains of urinary frequency, urgency, dish area, nocturia, intermittent flow and a weak stream. He feels like he as a "UTI". Nocturia chronic and bothersome.   UA today shows few bacteria, LE 2+, N -.  PVR = 0. No rbc's.   PMH: Past Medical History:  Diagnosis Date  . BP (high blood pressure) 06/08/2014  . Constipation   . Diabetes (HCC)   . HLD (hyperlipidemia)   . HTN (hypertension)   . Rhinitis     Surgical History: Past Surgical History:  Procedure Laterality Date  . URETHRA SURGERY  1995    Home Medications:  Allergies as of 03/17/2017   No Known Allergies     Medication List       Accurate as of 03/17/17 10:28 AM. Always use your most recent med list.          amLODipine 5 MG tablet Commonly known as:  NORVASC Take by mouth.   aspirin EC 81 MG tablet Take 81 mg by mouth daily.   benazepril-hydrochlorthiazide 20-12.5 MG tablet Commonly known as:  LOTENSIN HCT Take 2 tablets by mouth daily.   metFORMIN 1000 MG tablet Commonly known as:  GLUCOPHAGE Take 1,000 mg by mouth 2 (two) times daily with a meal.   metoprolol succinate 50 MG 24 hr tablet Commonly known as:   TOPROL-XL 1 tab po daily for tremor   rosuvastatin 20 MG tablet Commonly known as:  CRESTOR Take 20 mg by mouth daily.   testosterone 50 MG/5GM (1%) Gel Commonly known as:  ANDROGEL Apply topically.       Allergies: No Known Allergies  Family History: Family History  Problem Relation Age of Onset  . Hematuria Father   . Kidney disease Neg Hx   . Prostate cancer Neg Hx     Social History:  reports that he has been smoking Cigars.  He has never used smokeless tobacco. He reports that he does not drink alcohol or use drugs.  ROS: UROLOGY Frequent Urination?: Yes Hard to postpone urination?: Yes Burning/pain with urination?: Yes Get up at night to urinate?: Yes Leakage of urine?: Yes Urine stream starts and stops?: No Trouble starting stream?: Yes Do you have to strain to urinate?: No Blood in urine?: No Urinary tract infection?: No Sexually transmitted disease?: No Injury to kidneys or bladder?: No Painful intercourse?: No Weak stream?: Yes Erection problems?: No Penile pain?: No  Gastrointestinal Nausea?: No Vomiting?: No Indigestion/heartburn?: No Diarrhea?: No Constipation?: No  Constitutional Fever: No Night sweats?: No Weight loss?: No Fatigue?: No  Skin Skin rash/lesions?: No Itching?: No  Eyes Blurred vision?: No Double vision?: No  Ears/Nose/Throat Sore throat?: No Sinus problems?: Yes  Hematologic/Lymphatic Swollen glands?: No Easy bruising?: No  Cardiovascular Leg swelling?: No Chest pain?: No  Respiratory Cough?: No Shortness of breath?: No  Endocrine Excessive thirst?: No  Musculoskeletal Back pain?: No Joint pain?: No  Neurological Headaches?: No Dizziness?: No  Psychologic Depression?: No Anxiety?: No  Physical Exam: BP 140/86 (BP Location: Left Arm, Patient Position: Sitting, Cuff Size: Large)   Pulse 94   Ht 6' (1.829 m)   Wt 102.4 kg (225 lb 12.8 oz)   BMI 30.62 kg/m   Constitutional:  Alert and  oriented, No acute distress. HEENT: [place] AT, moist mucus membranes.  Trachea midline, no masses. Cardiovascular: No clubbing, cyanosis, or edema. Respiratory: Normal respiratory effort, no increased work of breathing. GI: Abdomen is soft, nontender, nondistended, no abdominal masses GU: No CVA tenderness. Penis - nl meatus. Testicles descended and palpably normal.  DRE: prostate about 30 grams. Subtle nodule left prostate.  Skin: No rashes, bruises or suspicious lesions. Lymph: No cervical or inguinal adenopathy. Neurologic: Grossly intact, no focal deficits, moving all 4 extremities. Psychiatric: Normal mood and affect.  Laboratory Data: No results found for: WBC, HGB, HCT, MCV, PLT  No results found for: CREATININE  No results found for: PSA  No results found for: TESTOSTERONE  No results found for: HGBA1C  Urinalysis    Component Value Date/Time   APPEARANCEUR Hazy (A) 11/17/2016 0905   GLUCOSEU Negative 11/17/2016 0905   BILIRUBINUR Negative 11/17/2016 0905   PROTEINUR 1+ (A) 11/17/2016 0905   NITRITE Negative 11/17/2016 0905   LEUKOCYTESUR Trace (A) 11/17/2016 0905    Pertinent Imaging:  Assessment & Plan:    1. Dysuria Urine sent for cx. Start cehpalexin, but pt requests Cipro. We discussed recent FDA warnings and again he elects Cipro.  - Urinalysis, Complete - Bladder Scan (Post Void Residual) in office  2. BPH, LUTS  - discussed nature, r/b/a to tamsulosin and he elected to start.   3. Prostate nodule - subtle. Prior bx negative. F/u 3 months with PSA prior for repeat DRE.    No Follow-up on file.  Jerilee FieldESKRIDGE, Toshiye Kever, MD  [facility] [address], [address] [place], [place] [address] [phone]

## 2017-03-23 LAB — CULTURE, URINE COMPREHENSIVE

## 2017-04-29 ENCOUNTER — Other Ambulatory Visit: Payer: Medicare Other

## 2017-04-29 DIAGNOSIS — N401 Enlarged prostate with lower urinary tract symptoms: Secondary | ICD-10-CM

## 2017-04-29 DIAGNOSIS — N138 Other obstructive and reflux uropathy: Secondary | ICD-10-CM

## 2017-04-30 LAB — PSA TOTAL (REFLEX TO FREE): Prostate Specific Ag, Serum: 1 ng/mL (ref 0.0–4.0)

## 2017-05-04 ENCOUNTER — Telehealth: Payer: Self-pay | Admitting: Family Medicine

## 2017-05-04 NOTE — Telephone Encounter (Signed)
Patient notified

## 2017-05-04 NOTE — Telephone Encounter (Signed)
-----   Message from Jerilee FieldMatthew Eskridge, MD sent at 05/03/2017  9:04 PM EDT ----- PSA remains low and stable/normal - f/u for repeat exam   ----- Message ----- From: Mervin KungWilliams, Ramona K, CMA Sent: 04/30/2017   3:54 PM To: Jerilee FieldMatthew Eskridge, MD    ----- Message ----- From: Interface, [facility] Lab Results In Sent: 04/30/2017   2:34 PM To: Jennette KettleBua Clinical

## 2017-06-22 ENCOUNTER — Ambulatory Visit (INDEPENDENT_AMBULATORY_CARE_PROVIDER_SITE_OTHER): Payer: Medicare Other | Admitting: Urology

## 2017-06-22 ENCOUNTER — Encounter: Payer: Self-pay | Admitting: Urology

## 2017-06-22 VITALS — BP 124/70 | HR 80 | Ht 72.0 in | Wt 225.0 lb

## 2017-06-22 DIAGNOSIS — R300 Dysuria: Secondary | ICD-10-CM

## 2017-06-22 DIAGNOSIS — N402 Nodular prostate without lower urinary tract symptoms: Secondary | ICD-10-CM | POA: Diagnosis not present

## 2017-06-22 LAB — URINALYSIS, COMPLETE
Bilirubin, UA: NEGATIVE
Glucose, UA: NEGATIVE
Ketones, UA: NEGATIVE
Nitrite, UA: POSITIVE — AB
Specific Gravity, UA: >1.03 — ABNORMAL HIGH (ref 1.005–1.030)
Urobilinogen, Ur: 0.2 mg/dL (ref 0.2–1.0)
pH, UA: 6 (ref 5.0–7.5)

## 2017-06-22 LAB — MICROSCOPIC EXAMINATION

## 2017-06-22 MED ORDER — CIPROFLOXACIN HCL 500 MG PO TABS: 500.0000 mg | ORAL_TABLET | Freq: Two times a day (BID) | ORAL | 3 refills | Status: AC

## 2017-06-22 MED ORDER — DIAZEPAM 10 MG PO TABS: 10.0000 mg | ORAL_TABLET | Freq: Once | ORAL | 0 refills | Status: AC

## 2017-06-22 NOTE — Progress Notes (Signed)
06/22/2017 10:50 AM   Casey CorningSamuel A Peters 02-14-1946 [id number]  Referring provider: Rafael BihariWalker, John B III, MD [phone] [facility] [place], [place] [address]  No chief complaint on file.   HPI:   Patient with h/o microhematuria, LUTS and low T. He had gross hematuria after the cystoscopy Nov 2016 but his LUTS improved. 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 CT was done Oct 2016. Prostate was about 65 grams. He has moderate LUTS including nocturia 2-3x  PSA 1.13 Feb 2016 (was up to 3.1). Prostate biopsy Nov 2016 benign with 42 g prostate.  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  UA showed 3-10 RBCs/hpf    He was seen Jun 2018 with worsening LUTS and started tamsulosin. PVR was 0 ml. Urine cx mixed. He also had a subtle prostate nodule on left. His July 2018 PSA was 1.   He returns for repeat exam and to review PSA and UA. He tried tamsulosin but noted no change in voiding. He has a weak stream. He has frequency and urgency. He has several cruises coming up this winter and asked for a rx of Cipro to have on hand if he develops dysuria.    PMH: Past Medical History:  Diagnosis Date  . BP (high blood pressure) 06/08/2014  . Constipation   . Diabetes (HCC)   . HLD (hyperlipidemia)   . HTN (hypertension)   . Rhinitis     Surgical History: Past Surgical History:  Procedure Laterality Date  . URETHRA SURGERY  1995    Home Medications:  Allergies as of 06/22/2017   No Known Allergies     Medication List       Accurate as of 06/22/17 10:50 AM. Always use your most recent med list.          amLODipine 5 MG tablet Commonly known as:  NORVASC Take by mouth.   aspirin EC 81 MG tablet Take 81 mg by mouth daily.   benazepril-hydrochlorthiazide 20-12.5 MG tablet Commonly known as:  LOTENSIN HCT Take 2 tablets by mouth daily.   metFORMIN 1000 MG tablet Commonly known as:  GLUCOPHAGE Take 1,000  mg by mouth 2 (two) times daily with a meal.   metoprolol succinate 50 MG 24 hr tablet Commonly known as:  TOPROL-XL 1 tab po daily for tremor   rosuvastatin 20 MG tablet Commonly known as:  CRESTOR Take 20 mg by mouth daily.   tamsulosin 0.4 MG Caps capsule Commonly known as:  FLOMAX Take 1 capsule (0.4 mg total) by mouth daily after supper.   testosterone 50 MG/5GM (1%) Gel Commonly known as:  ANDROGEL Apply topically.       Allergies: No Known Allergies  Family History: Family History  Problem Relation Age of Onset  . Hematuria Father   . Kidney disease Neg Hx   . Prostate cancer Neg Hx     Social History:  reports that he has been smoking Cigars.  He has never used smokeless tobacco. He reports that he does not drink alcohol or use drugs.  ROS:                                        Physical Exam: There were no vitals taken for this visit.  Constitutional:  Alert and oriented, No acute distress. HEENT: [place] AT, moist  mucus membranes.  Trachea midline, no masses. Cardiovascular: No clubbing, cyanosis, or edema. Respiratory: Normal respiratory effort, no increased work of breathing. GI: Abdomen is soft, nontender, nondistended, no abdominal masses GU: No CVA tenderness. DRE: small nodule, left prostate, normal landmarks  Skin: No rashes, bruises or suspicious lesions. Lymph: No cervical or inguinal adenopathy. Neurologic: Grossly intact, no focal deficits, moving all 4 extremities. Psychiatric: Normal mood and affect.  Laboratory Data: No results found for: WBC, HGB, HCT, MCV, PLT  No results found for: CREATININE  No results found for: PSA  No results found for: TESTOSTERONE  No results found for: HGBA1C  Urinalysis    Component Value Date/Time   APPEARANCEUR Clear 03/17/2017 1030   GLUCOSEU Negative 03/17/2017 1030   BILIRUBINUR Negative 03/17/2017 1030   PROTEINUR 1+ (A) 03/17/2017 1030   NITRITE Negative 03/17/2017 1030     LEUKOCYTESUR 2+ (A) 03/17/2017 1030    Pertinent Imaging:   Assessment & Plan:    1) prostate nodule - discussed concern with more prominent nodule today on exam even with low PSA. I recommended an MRI of the postate. Discussed concern for HG PCa.   2) LUTS - stable. Discussed FDA warnings on Cipro and sent Rx for his travels.   We'll plan to see him in 6 months unless MRI concerning.   There are no diagnoses linked to this encounter.  No Follow-up on file.  Jerilee Field, MD  [facility] [address], [address] [place], [place] [address] [phone]

## 2017-06-25 LAB — CULTURE, URINE COMPREHENSIVE

## 2017-06-28 ENCOUNTER — Telehealth: Payer: Self-pay

## 2017-06-28 DIAGNOSIS — N390 Urinary tract infection, site not specified: Secondary | ICD-10-CM

## 2017-06-28 MED ORDER — CEPHALEXIN 500 MG PO CAPS: 500.0000 mg | ORAL_CAPSULE | Freq: Two times a day (BID) | ORAL | 0 refills | Status: DC

## 2017-06-28 NOTE — Telephone Encounter (Signed)
Jerilee Field, MD  Skeet Latch, LPN        Notify patient his urine culture was positive-send to his pharmacy cephalexin 500 mg, one by mouth twice a day, #14, no refills. Thank you.    Spoke with pt in reference to +ucx. Made aware abx were sent to pharmacy. Pt voiced understanding.

## 2017-07-26 ENCOUNTER — Telehealth: Payer: Self-pay

## 2017-07-26 NOTE — Telephone Encounter (Signed)
Pt called stating he was seen on 9/11 and was supposed to have a prostate MRI. Pt stated he has not heard from anyone to have the MRI scheduled. I see where the order was placed and a note made about needing MRI now. Pt has a f/u appt 12/2017. Can you help me?

## 2017-07-26 NOTE — Telephone Encounter (Signed)
Casey Peters called the patient and left a message for him to cb to schedule. The patient needs to call scheduling at [phone].  Casey Peters

## 2017-08-06 ENCOUNTER — Ambulatory Visit (HOSPITAL_COMMUNITY)
Admission: RE | Admit: 2017-08-06 | Discharge: 2017-08-06 | Disposition: A | Discharge status: Home / Self Care | Payer: Medicare Other | Source: Ambulatory Visit | Attending: Urology | Admitting: Urology

## 2017-08-06 DIAGNOSIS — N138 Other obstructive and reflux uropathy: Secondary | ICD-10-CM | POA: Diagnosis not present

## 2017-08-06 DIAGNOSIS — N402 Nodular prostate without lower urinary tract symptoms: Secondary | ICD-10-CM

## 2017-08-06 DIAGNOSIS — N401 Enlarged prostate with lower urinary tract symptoms: Secondary | ICD-10-CM | POA: Diagnosis not present

## 2017-08-06 LAB — POCT I-STAT CREATININE: Creatinine, Ser: 0.7 mg/dL (ref 0.61–1.24)

## 2017-08-06 MED ORDER — LIDOCAINE HCL 2 % EX GEL: 1.0000 "application " | Freq: Once | CUTANEOUS | Status: DC

## 2017-08-06 MED ORDER — GADOBENATE DIMEGLUMINE 529 MG/ML IV SOLN
20.0000 mL | Freq: Once | INTRAVENOUS | Status: AC | PRN
  Administered 2017-08-06: 20 mL via INTRAVENOUS

## 2017-08-09 ENCOUNTER — Other Ambulatory Visit: Payer: Self-pay

## 2017-08-09 ENCOUNTER — Telehealth: Payer: Self-pay | Admitting: Urology

## 2017-08-09 NOTE — Telephone Encounter (Signed)
Please advise 

## 2017-08-09 NOTE — Telephone Encounter (Signed)
Pt called office asking for his Prostate Bx Results.  Pt states [facility]. His follow up appt is not until next March 2019. Please advise. Thanks

## 2017-08-09 NOTE — Telephone Encounter (Signed)
Jerilee FieldEskridge, Matthew, MD  Skeet LatchWatkins, Chelsea C, LPN        Notify patient his prostate MRI was normal (no sign of any prostate cancer), so we'll see him back in 6 months as planned.    Spoke with pt in reference to MRI results. Pt stated that he does not want to wait the 49mo. Pt requested to RTC in 26mo. Appt was made.

## 2017-09-17 ENCOUNTER — Ambulatory Visit: Payer: Medicare Other

## 2017-12-03 ENCOUNTER — Ambulatory Visit (INDEPENDENT_AMBULATORY_CARE_PROVIDER_SITE_OTHER): Payer: Medicare Other | Admitting: Urology

## 2017-12-03 VITALS — BP 149/86 | HR 98 | Ht 71.0 in | Wt 211.0 lb

## 2017-12-03 DIAGNOSIS — G8929 Other chronic pain: Secondary | ICD-10-CM | POA: Diagnosis not present

## 2017-12-03 DIAGNOSIS — N390 Urinary tract infection, site not specified: Secondary | ICD-10-CM | POA: Diagnosis not present

## 2017-12-03 DIAGNOSIS — R8281 Pyuria: Secondary | ICD-10-CM

## 2017-12-03 DIAGNOSIS — M546 Pain in thoracic spine: Secondary | ICD-10-CM

## 2017-12-03 LAB — URINALYSIS, COMPLETE
Bilirubin, UA: NEGATIVE
Glucose, UA: NEGATIVE
Ketones, UA: NEGATIVE
Nitrite, UA: POSITIVE — AB
RBC, UA: NEGATIVE
Specific Gravity, UA: 1.02 (ref 1.005–1.030)
Urobilinogen, Ur: 0.2 mg/dL (ref 0.2–1.0)
pH, UA: 7 (ref 5.0–7.5)

## 2017-12-03 LAB — MICROSCOPIC EXAMINATION

## 2017-12-03 NOTE — Progress Notes (Signed)
12/03/2017 8:55 AM   Casey Peters 17-Feb-1946 [id number]  Referring provider: No referring provider defined for this encounter.  Chief Complaint  Patient presents with  . Follow-up    HPI:  F/u -  1) urethral stricture -  He had gross hematuria after the cystoscopy Nov 2016but his LUTS improved. 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 CT was done Oct 2016.   2) BPH, LUTS - Prostate was about 65 grams on CT. He has moderate LUTS including nocturia 2-3x. Tried tamsulosin Jun 2018 for weak stream, frequency and urgency without much change.   3) prostate nodule - PSA 1.13 Feb 2016. Prostate biopsy Nov 2016 benign with 42 g prostate (for rising PSA up to 3.1).  His July 2018 PSA was 1. A small nodule was noted on the left prostate Sep 2018.   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   Patient returns in continued management of the above. He had low back pain and he took abx. He wanted to "get his kidneys checked out". No h/o stones. He had no gross hematuria or dysuria. F/u prostate MRI 08/06/2017 was done was benign. Prostate measured 61 g. I reviewed the images. He feels like he's voiding with a good stream. No dysuria.     PMH: Past Medical History:  Diagnosis Date  . BP (high blood pressure) 06/08/2014  . Constipation   . Diabetes (HCC)   . HLD (hyperlipidemia)   . HTN (hypertension)   . Rhinitis     Surgical History: Past Surgical History:  Procedure Laterality Date  . URETHRA SURGERY  1995    Home Medications:  Allergies as of 12/03/2017   No Known Allergies     Medication List        Accurate as of 12/03/17  8:55 AM. Always use your most recent med list.          amLODipine 5 MG tablet Commonly known as:  NORVASC Take by mouth.   aspirin EC 81 MG tablet Take 81 mg by mouth daily.   benazepril-hydrochlorthiazide 20-12.5 MG tablet Commonly known as:  LOTENSIN HCT Take 2 tablets by mouth  daily.   cephALEXin 500 MG capsule Commonly known as:  KEFLEX Take 1 capsule (500 mg total) by mouth 2 (two) times daily.   metFORMIN 1000 MG tablet Commonly known as:  GLUCOPHAGE Take 1,000 mg by mouth 2 (two) times daily with a meal.   metoprolol succinate 50 MG 24 hr tablet Commonly known as:  TOPROL-XL 1 tab po daily for tremor   rosuvastatin 20 MG tablet Commonly known as:  CRESTOR Take 20 mg by mouth daily.   tamsulosin 0.4 MG Caps capsule Commonly known as:  FLOMAX Take 1 capsule (0.4 mg total) by mouth daily after supper.   testosterone 50 MG/5GM (1%) Gel Commonly known as:  ANDROGEL Apply topically.       Allergies: No Known Allergies  Family History: Family History  Problem Relation Age of Onset  . Hematuria Father   . Kidney disease Neg Hx   . Prostate cancer Neg Hx     Social History:  reports that he has been smoking cigars.  he has never used smokeless tobacco. He reports that he does not drink alcohol or use drugs.  ROS: UROLOGY Frequent Urination?: Yes Hard to postpone urination?: No Burning/pain with urination?: No Get up at night to urinate?: Yes Leakage of urine?: No Urine stream  starts and stops?: No Trouble starting stream?: No Do you have to strain to urinate?: No Blood in urine?: No Urinary tract infection?: No Sexually transmitted disease?: No Injury to kidneys or bladder?: No Painful intercourse?: No Weak stream?: Yes Erection problems?: No Penile pain?: No  Gastrointestinal Nausea?: No Vomiting?: No Indigestion/heartburn?: No Diarrhea?: No Constipation?: No  Constitutional Fever: No Night sweats?: No Weight loss?: No Fatigue?: No  Skin Skin rash/lesions?: No Itching?: No  Eyes Double vision?: No  Ears/Nose/Throat Sore throat?: No Sinus problems?: No  Hematologic/Lymphatic Swollen glands?: No Easy bruising?: No  Cardiovascular Leg swelling?: No Chest pain?: No  Respiratory Cough?: No Shortness of  breath?: No  Endocrine Excessive thirst?: No  Musculoskeletal Back pain?: Yes Joint pain?: No  Neurological Headaches?: No Dizziness?: No  Psychologic Depression?: No Anxiety?: No  Physical Exam: BP (!) 149/86   Pulse 98   Ht 5\' 11"  (1.803 m)   Wt 95.7 kg (211 lb)   BMI 29.43 kg/m   Constitutional:  Alert and oriented, No acute distress. HEENT: [place] AT, moist mucus membranes.  Trachea midline, no masses. Cardiovascular: No clubbing, cyanosis, or edema. Respiratory: Normal respiratory effort, no increased work of breathing. GI: Abdomen is soft, nontender, nondistended, no abdominal masses GU: No CVA tenderness.  Skin: No rashes, bruises or suspicious lesions. Neurologic: Grossly intact, no focal deficits, moving all 4 extremities. Psychiatric: Normal mood and affect.  Laboratory Data: No results found for: WBC, HGB, HCT, MCV, PLT  Lab Results  Component Value Date   CREATININE 0.70 08/06/2017    Lab Results  Component Value Date   PSA1 1.0 04/29/2017   PSA1 1.4 03/03/2016   PSA1 2.1 07/10/2015    No results found for: TESTOSTERONE  No results found for: HGBA1C  Urinalysis Lab Results  Component Value Date   SPECGRAV >1.030 (H) 06/22/2017   PHUR 6.0 06/22/2017   COLORU Yellow 06/22/2017   APPEARANCEUR Cloudy (A) 06/22/2017   LEUKOCYTESUR Trace (A) 06/22/2017   PROTEINUR 1+ (A) 06/22/2017   GLUCOSEU Negative 06/22/2017   KETONESU Negative 06/22/2017   RBCU 1+ (A) 06/22/2017   BILIRUBINUR Negative 06/22/2017   UUROB 0.2 06/22/2017   NITRITE Positive (A) 06/22/2017    Lab Results  Component Value Date   LABMICR See below: 06/22/2017   WBCUA 0-5 06/22/2017   RBCUA 11-30 (A) 06/22/2017   LABEPIT 0-10 06/22/2017   MUCUS Present (A) 03/17/2017   BACTERIA Many (A) 06/22/2017    Pertinent Imaging: MRI No results found for this or any previous visit. No results found for this or any previous visit. No results found for this or any previous  visit. No results found for this or any previous visit. No results found for this or any previous visit. No results found for this or any previous visit. No results found for this or any previous visit. No results found for this or any previous visit.  Assessment & Plan:    1. Back pain, pyuria - screen kidneys with renal/ bladder US. UA today with a few wbc's. Many bacteria. It was sent for Cx.   - Urinalysis, Complete   No Follow-up on file.  Jerilee FieldMatthew Arnetra Terris, MD  [facility] [address], [address] [place], [place] [address] [phone]

## 2017-12-07 LAB — CULTURE, URINE COMPREHENSIVE

## 2017-12-08 ENCOUNTER — Telehealth: Payer: Self-pay

## 2017-12-08 DIAGNOSIS — R8281 Pyuria: Secondary | ICD-10-CM

## 2017-12-08 MED ORDER — CEFPODOXIME PROXETIL 100 MG PO TABS: 100.0000 mg | ORAL_TABLET | Freq: Two times a day (BID) | ORAL | 0 refills | Status: DC

## 2017-12-08 NOTE — Telephone Encounter (Signed)
Casey Peters, Matthew, MD  Skeet LatchWatkins, Carlita Whitcomb C, LPN        Notify patient his urine culture was positive and an abx has been sent to her pharmacy -- please send in cefpodoxime 100 mg, one po bid, #14, no refills -- thanks    Spoke with pt in reference to +ucx and abx. Abx sent to pharmacy. Pt voiced understanding.

## 2017-12-20 ENCOUNTER — Ambulatory Visit: Payer: Medicare Other

## 2017-12-22 ENCOUNTER — Ambulatory Visit
Admission: RE | Admit: 2017-12-22 | Discharge: 2017-12-22 | Disposition: A | Discharge status: Home / Self Care | Payer: Medicare Other | Source: Ambulatory Visit | Attending: Urology | Admitting: Urology

## 2017-12-22 DIAGNOSIS — N390 Urinary tract infection, site not specified: Secondary | ICD-10-CM | POA: Insufficient documentation

## 2017-12-22 DIAGNOSIS — N281 Cyst of kidney, acquired: Secondary | ICD-10-CM | POA: Diagnosis not present

## 2017-12-22 DIAGNOSIS — R8281 Pyuria: Secondary | ICD-10-CM

## 2017-12-27 ENCOUNTER — Telehealth: Payer: Self-pay

## 2017-12-27 NOTE — Telephone Encounter (Signed)
Casey Peters, Matthew, MD  Skeet LatchWatkins, Chelsea C, LPN        Notify patient -- his kidneys and bladder appeared normal on the US.   Thanks.    Letter sent.

## 2018-01-13 ENCOUNTER — Ambulatory Visit
Admission: RE | Admit: 2018-01-13 | Discharge: 2018-01-13 | Disposition: A | Discharge status: Home / Self Care | Payer: Medicare Other | Source: Ambulatory Visit | Attending: Physician Assistant | Admitting: Physician Assistant

## 2018-01-13 ENCOUNTER — Other Ambulatory Visit: Payer: Self-pay | Admitting: Physician Assistant

## 2018-01-13 ENCOUNTER — Other Ambulatory Visit
Admission: RE | Admit: 2018-01-13 | Discharge: 2018-01-13 | Disposition: A | Discharge status: Home / Self Care | Payer: Medicare Other | Source: Ambulatory Visit | Attending: Physician Assistant | Admitting: Physician Assistant

## 2018-01-13 DIAGNOSIS — J90 Pleural effusion, not elsewhere classified: Secondary | ICD-10-CM | POA: Insufficient documentation

## 2018-01-13 DIAGNOSIS — R0602 Shortness of breath: Secondary | ICD-10-CM | POA: Diagnosis not present

## 2018-01-13 DIAGNOSIS — J9811 Atelectasis: Secondary | ICD-10-CM | POA: Insufficient documentation

## 2018-01-13 DIAGNOSIS — I499 Cardiac arrhythmia, unspecified: Secondary | ICD-10-CM | POA: Insufficient documentation

## 2018-01-13 DIAGNOSIS — R918 Other nonspecific abnormal finding of lung field: Secondary | ICD-10-CM | POA: Insufficient documentation

## 2018-01-13 LAB — FIBRIN DERIVATIVES D-DIMER (ARMC ONLY): Fibrin derivatives D-dimer (ARMC): 563.22 ng/mL (FEU) — ABNORMAL HIGH (ref 0.00–499.00)

## 2018-01-13 LAB — POCT I-STAT CREATININE: Creatinine, Ser: 1.3 mg/dL — ABNORMAL HIGH (ref 0.61–1.24)

## 2018-01-13 MED ORDER — IOHEXOL 350 MG/ML SOLN
75.0000 mL | Freq: Once | INTRAVENOUS | Status: AC | PRN
  Administered 2018-01-13: 75 mL via INTRAVENOUS

## 2018-02-08 ENCOUNTER — Other Ambulatory Visit: Payer: Self-pay | Admitting: Cardiology

## 2018-02-08 DIAGNOSIS — I420 Dilated cardiomyopathy: Secondary | ICD-10-CM

## 2018-02-16 ENCOUNTER — Ambulatory Visit (HOSPITAL_COMMUNITY)
Admission: RE | Admit: 2018-02-16 | Discharge: 2018-02-16 | Disposition: A | Discharge status: Home / Self Care | Payer: Medicare Other | Source: Ambulatory Visit | Attending: Cardiology | Admitting: Cardiology

## 2018-02-16 DIAGNOSIS — I420 Dilated cardiomyopathy: Secondary | ICD-10-CM | POA: Diagnosis present

## 2018-02-16 DIAGNOSIS — I429 Cardiomyopathy, unspecified: Secondary | ICD-10-CM | POA: Diagnosis not present

## 2018-02-16 DIAGNOSIS — I340 Nonrheumatic mitral (valve) insufficiency: Secondary | ICD-10-CM | POA: Insufficient documentation

## 2018-02-16 MED ORDER — GADOBENATE DIMEGLUMINE 529 MG/ML IV SOLN
30.0000 mL | Freq: Once | INTRAVENOUS | Status: AC
  Administered 2018-02-16: 30 mL via INTRAVENOUS

## 2018-03-03 ENCOUNTER — Ambulatory Visit: Payer: Medicare Other

## 2018-10-18 ENCOUNTER — Ambulatory Visit: Payer: Medicare Other | Admitting: Family

## 2018-10-25 ENCOUNTER — Ambulatory Visit: Payer: Medicare Other | Attending: Family | Admitting: Family

## 2018-10-25 ENCOUNTER — Encounter: Payer: Self-pay | Admitting: Family

## 2018-10-25 VITALS — BP 109/50 | HR 94 | Resp 18 | Ht 72.0 in | Wt 208.5 lb

## 2018-10-25 DIAGNOSIS — I110 Hypertensive heart disease with heart failure: Secondary | ICD-10-CM | POA: Diagnosis present

## 2018-10-25 DIAGNOSIS — E785 Hyperlipidemia, unspecified: Secondary | ICD-10-CM | POA: Diagnosis not present

## 2018-10-25 DIAGNOSIS — E119 Type 2 diabetes mellitus without complications: Secondary | ICD-10-CM | POA: Diagnosis not present

## 2018-10-25 DIAGNOSIS — I5022 Chronic systolic (congestive) heart failure: Secondary | ICD-10-CM

## 2018-10-25 DIAGNOSIS — I10 Essential (primary) hypertension: Secondary | ICD-10-CM

## 2018-10-25 DIAGNOSIS — Z7984 Long term (current) use of oral hypoglycemic drugs: Secondary | ICD-10-CM | POA: Insufficient documentation

## 2018-10-25 DIAGNOSIS — Z7982 Long term (current) use of aspirin: Secondary | ICD-10-CM | POA: Diagnosis not present

## 2018-10-25 DIAGNOSIS — Z79899 Other long term (current) drug therapy: Secondary | ICD-10-CM | POA: Diagnosis not present

## 2018-10-25 DIAGNOSIS — F17290 Nicotine dependence, other tobacco product, uncomplicated: Secondary | ICD-10-CM | POA: Diagnosis not present

## 2018-10-25 DIAGNOSIS — Z720 Tobacco use: Secondary | ICD-10-CM

## 2018-10-25 NOTE — Progress Notes (Signed)
Patient ID: Casey Peters, male    DOB: 1946/07/12, 73 y.o.   MRN: [id number]  HPI  [name] is a 73 y/o male with a history of DM, hyperlipidemia, HTN, current tobacco use and chronic HF.   Echo report from 08/27/18 reviewed and showed an EF of 25% along with mild MR and moderate TR. Echo report from 02/01/18 reviewed and showed an EF of 20% along with mild MR and moderate TR.   Has not been admitted or been in the ED in the last 6 months.  He presents today for his initial visit with a chief complaint of minimal fatigue upon moderate exertion. He describes this as chronic in nature having been present for several months. He has associated rhinorrhea along with this. He denies any difficulty sleeping, abdominal distention, palpitations, pedal edema, chest pain, shortness of breath, dizziness or weight gain. He can walk 1 1/3 times around his block before he gets overly tired. He thought today's visit was about cardiac rehab.   Past Medical History:  Diagnosis Date  . BP (high blood pressure) 06/08/2014  . CHF (congestive heart failure) (HCC)   . Constipation   . Diabetes (HCC)   . HLD (hyperlipidemia)   . HTN (hypertension)   . Rhinitis    Past Surgical History:  Procedure Laterality Date  . HERNIA REPAIR  1998  . PILONIDAL CYST EXCISION    . TONSILLECTOMY    . URETHRA SURGERY  1995   Family History  Problem Relation Age of Onset  . Hematuria Father   . Kidney disease Neg Hx   . Prostate cancer Neg Hx    Social History   Tobacco Use  . Smoking status: Current Every Day Smoker    Types: Cigars  . Smokeless tobacco: Never Used  . Tobacco comment: quit 6 months  Substance Use Topics  . Alcohol use: No    Alcohol/week: 0.0 standard drinks   No Known Allergies Prior to Admission medications   Medication Sig Start Date End Date Taking? Authorizing Provider  aspirin EC 81 MG tablet Take 81 mg by mouth daily.   Yes [provider]  carvedilol (COREG)  3.125 MG tablet Take 3.125 mg by mouth 2 (two) times daily. 05/19/18  Yes [provider]  furosemide (LASIX) 40 MG tablet Take 40 mg by mouth 2 (two) times daily. 06/07/18  Yes [provider]  metFORMIN (GLUCOPHAGE) 1000 MG tablet Take 500 mg by mouth daily with breakfast.    Yes [provider]  spironolactone (ALDACTONE) 25 MG tablet Take 25 mg by mouth daily. 02/21/18 02/21/19 Yes [provider]  testosterone (ANDROGEL) 50 MG/5GM (1%) GEL Apply 5 g topically daily. 04/26/18  Yes [provider]     Review of Systems  Constitutional: Positive for fatigue. Negative for appetite change.  HENT: Positive for rhinorrhea. Negative for congestion and sore throat.   Eyes: Negative.   Respiratory: Negative for chest tightness and shortness of breath.   Cardiovascular: Negative for chest pain, palpitations and leg swelling.  Gastrointestinal: Negative for abdominal distention and abdominal pain.  Endocrine: Negative.   Genitourinary: Negative.   Musculoskeletal: Negative for back pain and neck pain.  Skin: Negative.   Allergic/Immunologic: Negative.   Neurological: Negative for dizziness and light-headedness.  Hematological: Negative for adenopathy. Does not bruise/bleed easily.  Psychiatric/Behavioral: Negative for dysphoric mood and sleep disturbance (sleeping on 1 pillow). The patient is not nervous/anxious.    Vitals:   10/25/18 1217  BP: (!) 109/50  Pulse: 94  Resp: 18  SpO2: 98%  Weight: 208 lb 8 oz (94.6 kg)  Height: 6' (1.829 m)   Wt Readings from Last 3 Encounters:  10/25/18 208 lb 8 oz (94.6 kg)  12/03/17 211 lb (95.7 kg)  06/22/17 225 lb (102.1 kg)   Lab Results  Component Value Date   CREATININE 1.30 (H) 01/13/2018   CREATININE 0.70 08/06/2017    Physical Exam Vitals signs and nursing note reviewed.  Constitutional:      Appearance: Normal appearance.  HENT:     Head: Normocephalic and atraumatic.  Neck:      Musculoskeletal: Normal range of motion and neck supple.  Cardiovascular:     Rate and Rhythm: Normal rate and regular rhythm.  Pulmonary:     Effort: Pulmonary effort is normal.     Breath sounds: No wheezing or rales.  Abdominal:     Palpations: Abdomen is soft.     Tenderness: There is no abdominal tenderness.  Musculoskeletal:        General: No swelling or tenderness.     Right lower leg: No edema.     Left lower leg: No edema.  Skin:    General: Skin is warm and dry.  Neurological:     General: No focal deficit present.     Mental Status: He is alert and oriented to person, place, and time.  Psychiatric:        Mood and Affect: Mood normal.        Behavior: Behavior normal.    Assessment & Plan:  1:Chronic heart failure with reduced ejection fraction- - NYHA class II - euvolemic today - weighing daily and he was reminded to call for an overnight weight gain of >2 pounds or a weekly weight gain of >5 pounds - not adding salt and is currently following a keto diet. Has been reading food labels to keep daily sodium intake to 2000mg  / day - walks 1 1/3 times around the block without difficulty; more than that causes fatigue the following day - saw cardiology (Fath) 08/16/18 - BP limits initiation of entresto or possible carvedilol titration; wife says that he was on benazepril in the past and it caused hypotension - does not take the flu vaccine; good handwashing encouraged - referral placed for cardiac rehab; explained that the order would go to Dr. Lady GaryFath for his approval  2: HTN- - BP looks good today although on the low side - saw PCP Letitia Libra([place]) 05/23/18 - BMP from 09/19/18 reviewed and showed sodium 143, potassium 4.0, creatinine 1.1 and GFR 66  3: DM- - glucose at home was 131 - A1c 09/19/18 was 6.4%  4: Tobacco use- - smokes cigars daily  Patient did not bring his medications nor a list. Each medication was verbally reviewed with the patient and he was encouraged  to bring the bottles to every visit to confirm accuracy of list.  Patient opts to not make a return appointment at this time. Advised him that he could call back at anytime to make another appointment.

## 2018-10-25 NOTE — Patient Instructions (Signed)
Continue weighing daily and call for an overnight weight gain of > 2 pounds or a weekly weight gain of >5 pounds. 

## 2018-11-10 ENCOUNTER — Encounter: Payer: Medicare Other | Attending: Cardiology

## 2018-11-10 VITALS — Ht 71.0 in | Wt 209.0 lb

## 2018-11-10 DIAGNOSIS — I5022 Chronic systolic (congestive) heart failure: Secondary | ICD-10-CM | POA: Diagnosis not present

## 2018-11-10 NOTE — Progress Notes (Signed)
Cardiac Individual Treatment Plan  Patient Details  Name: Casey Peters MRN: [id number] Date of Birth: 02-Jun-1946 Referring Provider:     Cardiac Rehab from 11/10/2018 in [facility] Cardiac and Pulmonary Rehab  Referring Provider  Fath      Initial Encounter Date:    Cardiac Rehab from 11/10/2018 in [facility] Cardiac and Pulmonary Rehab  Date  11/10/18      Visit Diagnosis: Heart failure, chronic systolic ([place])  Patient's Home Medications on Admission:  Current Outpatient Medications:  .  aspirin EC 81 MG tablet, Take 81 mg by mouth daily., Disp: , Rfl:  .  carvedilol (COREG) 3.125 MG tablet, Take 3.125 mg by mouth 2 (two) times daily., Disp: , Rfl:  .  furosemide (LASIX) 40 MG tablet, Take 40 mg by mouth 2 (two) times daily., Disp: , Rfl:  .  metFORMIN (GLUCOPHAGE) 1000 MG tablet, Take 500 mg by mouth daily with breakfast. , Disp: , Rfl:  .  spironolactone (ALDACTONE) 25 MG tablet, Take 25 mg by mouth daily., Disp: , Rfl:  .  testosterone (ANDROGEL) 50 MG/5GM (1%) GEL, Apply 5 g topically daily., Disp: , Rfl:   Past Medical History: Past Medical History:  Diagnosis Date  . BP (high blood pressure) 06/08/2014  . CHF (congestive heart failure) ([place])   . Constipation   . Diabetes ([place])   . HLD (hyperlipidemia)   . HTN (hypertension)   . Rhinitis     Tobacco Use: Social History   Tobacco Use  Smoking Status Current Every Day Smoker  . Types: Cigars  Smokeless Tobacco Never Used  Tobacco Comment   2-3 cigars per day    Labs: Recent Review Flowsheet Data    There is no flowsheet data to display.       Exercise Target Goals: Exercise Program Goal: Individual exercise prescription set using results from initial 6 min walk test and THRR while considering  patient's activity barriers and safety.   Exercise Prescription Goal: Initial exercise prescription builds to 30-45 minutes a day of aerobic activity, 2-3 days per week.  Home exercise guidelines will be given to  patient during program as part of exercise prescription that the participant will acknowledge.  Activity Barriers & Risk Stratification: Activity Barriers & Cardiac Risk Stratification - 11/10/18 1506      Activity Barriers & Cardiac Risk Stratification   Activity Barriers  None    Cardiac Risk Stratification  High       6 Minute Walk: 6 Minute Walk    Row Name 11/10/18 1457         6 Minute Walk   Phase  Initial     Distance  1270 feet     Walk Time  6 minutes     # of Rest Breaks  0     MPH  2.4     METS  2.7     RPE  9     VO2 Peak  9.5     Symptoms  No     Resting HR  96 bpm     Resting BP  98/68     Resting Oxygen Saturation   96 %     Exercise Oxygen Saturation  during 6 min walk  98 %     Max Ex. HR  111 bpm     Max Ex. BP  114/68     2 Minute Post BP  124/64        Oxygen Initial Assessment:   Oxygen  Re-Evaluation:   Oxygen Discharge (Final Oxygen Re-Evaluation):   Initial Exercise Prescription: Initial Exercise Prescription - 11/10/18 1500      Date of Initial Exercise RX and Referring Provider   Date  11/10/18    Referring Provider  Fath      Treadmill   MPH  2    Grade  0.5    Minutes  15    METs  2.5      Recumbant Bike   Level  3    RPM  60    Watts  22    Minutes  15    METs  2.5      NuStep   Level  2    SPM  80    Minutes  15    METs  2.5      Prescription Details   Frequency (times per week)  3    Duration  Progress to 45 minutes of aerobic exercise without signs/symptoms of physical distress      Intensity   THRR 40-80% of Max Heartrate  117-138    Ratings of Perceived Exertion  11-15    Perceived Dyspnea  0-4      Resistance Training   Training Prescription  Yes    Weight  3 lb    Reps  10-15       Perform Capillary Blood Glucose checks as needed.  Exercise Prescription Changes: Exercise Prescription Changes    Row Name 11/10/18 1500             Response to Exercise   Blood Pressure (Admit)  98/68        Blood Pressure (Exercise)  114/68       Blood Pressure (Exit)  124/64       Heart Rate (Admit)  101 bpm       Heart Rate (Exercise)  111 bpm       Heart Rate (Exit)  100 bpm       Oxygen Saturation (Admit)  96 %       Oxygen Saturation (Exit)  98 %       Rating of Perceived Exertion (Exercise)  9          Exercise Comments:   Exercise Goals and Review: Exercise Goals    Row Name 11/10/18 1457             Exercise Goals   Increase Physical Activity  Yes       Intervention  Provide advice, education, support and counseling about physical activity/exercise needs.;Develop an individualized exercise prescription for aerobic and resistive training based on initial evaluation findings, risk stratification, comorbidities and participant's personal goals.       Expected Outcomes  Short Term: Attend rehab on a regular basis to increase amount of physical activity.;Long Term: Add in home exercise to make exercise part of routine and to increase amount of physical activity.;Long Term: Exercising regularly at least 3-5 days a week.       Increase Strength and Stamina  Yes       Intervention  Provide advice, education, support and counseling about physical activity/exercise needs.;Develop an individualized exercise prescription for aerobic and resistive training based on initial evaluation findings, risk stratification, comorbidities and participant's personal goals.       Expected Outcomes  Short Term: Increase workloads from initial exercise prescription for resistance, speed, and METs.;Short Term: Perform resistance training exercises routinely during rehab and add in resistance training at home;Long Term: Improve cardiorespiratory fitness,  muscular endurance and strength as measured by increased METs and functional capacity (6MWT)       Able to understand and use rate of perceived exertion (RPE) scale  Yes       Intervention  Provide education and explanation on how to use RPE scale        Expected Outcomes  Short Term: Able to use RPE daily in rehab to express subjective intensity level;Long Term:  Able to use RPE to guide intensity level when exercising independently       Able to understand and use Dyspnea scale  Yes       Intervention  Provide education and explanation on how to use Dyspnea scale       Expected Outcomes  Short Term: Able to use Dyspnea scale daily in rehab to express subjective sense of shortness of breath during exertion;Long Term: Able to use Dyspnea scale to guide intensity level when exercising independently       Knowledge and understanding of Target Heart Rate Range (THRR)  Yes       Intervention  Provide education and explanation of THRR including how the numbers were predicted and where they are located for reference       Expected Outcomes  Short Term: Able to state/look up THRR;Short Term: Able to use daily as guideline for intensity in rehab;Long Term: Able to use THRR to govern intensity when exercising independently       Able to check pulse independently  Yes       Intervention  Provide education and demonstration on how to check pulse in carotid and radial arteries.;Review the importance of being able to check your own pulse for safety during independent exercise       Expected Outcomes  Short Term: Able to explain why pulse checking is important during independent exercise;Long Term: Able to check pulse independently and accurately       Understanding of Exercise Prescription  Yes       Intervention  Provide education, explanation, and written materials on patient's individual exercise prescription       Expected Outcomes  Short Term: Able to explain program exercise prescription;Long Term: Able to explain home exercise prescription to exercise independently          Exercise Goals Re-Evaluation :   Discharge Exercise Prescription (Final Exercise Prescription Changes): Exercise Prescription Changes - 11/10/18 1500      Response to Exercise    Blood Pressure (Admit)  98/68    Blood Pressure (Exercise)  114/68    Blood Pressure (Exit)  124/64    Heart Rate (Admit)  101 bpm    Heart Rate (Exercise)  111 bpm    Heart Rate (Exit)  100 bpm    Oxygen Saturation (Admit)  96 %    Oxygen Saturation (Exit)  98 %    Rating of Perceived Exertion (Exercise)  9       Nutrition:  Target Goals: Understanding of nutrition guidelines, daily intake of sodium <1574m, cholesterol <2066m calories 30% from fat and 7% or less from saturated fats, daily to have 5 or more servings of fruits and vegetables.  Biometrics: Pre Biometrics - 11/10/18 1456      Pre Biometrics   Height  '5\' 11"'  (1.803 m)    Weight  209 lb (94.8 kg)    Waist Circumference  42.25 inches    Hip Circumference  38 inches    Waist to Hip Ratio  1.11 %  BMI (Calculated)  29.16    Single Leg Stand  6.5 seconds        Nutrition Therapy Plan and Nutrition Goals: Nutrition Therapy & Goals - 11/10/18 1519      Nutrition Therapy   Diet  low sodium keto diet with fluid restriction      Intervention Plan   Intervention  Prescribe, educate and counsel regarding individualized specific dietary modifications aiming towards targeted core components such as weight, hypertension, lipid management, diabetes, heart failure and other comorbidities.    Expected Outcomes  Short Term Goal: Understand basic principles of dietary content, such as calories, fat, sodium, cholesterol and nutrients.;Short Term Goal: A plan has been developed with personal nutrition goals set during dietitian appointment.;Long Term Goal: Adherence to prescribed nutrition plan.       Nutrition Assessments: Nutrition Assessments - 11/10/18 1520      MEDFICTS Scores   Pre Score  42       Nutrition Goals Re-Evaluation:   Nutrition Goals Discharge (Final Nutrition Goals Re-Evaluation):   Psychosocial: Target Goals: Acknowledge presence or absence of significant depression and/or stress, maximize  coping skills, provide positive support system. Participant is able to verbalize types and ability to use techniques and skills needed for reducing stress and depression.   Initial Review & Psychosocial Screening: Initial Psych Review & Screening - 11/10/18 1511      Initial Review   Current issues with  None Identified      Family Dynamics   Good Support System?  Yes      Barriers   Psychosocial barriers to participate in program  The patient should benefit from training in stress management and relaxation.      Screening Interventions   Interventions  Encouraged to exercise;Program counselor consult;To provide support and resources with identified psychosocial needs;Provide feedback about the scores to participant    Expected Outcomes  Short Term goal: Utilizing psychosocial counselor, staff and physician to assist with identification of specific Stressors or current issues interfering with healing process. Setting desired goal for each stressor or current issue identified.;Long Term Goal: Stressors or current issues are controlled or eliminated.;Short Term goal: Identification and review with participant of any Quality of Life or Depression concerns found by scoring the questionnaire.;Long Term goal: The participant improves quality of Life and PHQ9 Scores as seen by post scores and/or verbalization of changes       Quality of Life Scores:  Quality of Life - 11/10/18 1511      Quality of Life   Select  Quality of Life      Quality of Life Scores   Health/Function Pre  18.83 %    Socioeconomic Pre  28 %    Psych/Spiritual Pre  23.79 %    Family Pre  25.5 %    GLOBAL Pre  22.56 %      Scores of 19 and below usually indicate a poorer quality of life in these areas.  A difference of  2-3 points is a clinically meaningful difference.  A difference of 2-3 points in the total score of the Quality of Life Index has been associated with significant improvement in overall quality of life,  self-image, physical symptoms, and general health in studies assessing change in quality of life.  PHQ-9: Recent Review Flowsheet Data    Depression screen [facility] 2/9 11/10/2018   Decreased Interest 0   Down, Depressed, Hopeless 0   PHQ - 2 Score 0   Altered sleeping 0  Tired, decreased energy 1   Change in appetite 0   Feeling bad or failure about yourself  0   Trouble concentrating 2   Moving slowly or fidgety/restless 0   Suicidal thoughts 0   PHQ-9 Score 3   Difficult doing work/chores Not difficult at all     Interpretation of Total Score  Total Score Depression Severity:  1-4 = Minimal depression, 5-9 = Mild depression, 10-14 = Moderate depression, 15-19 = Moderately severe depression, 20-27 = Severe depression   Psychosocial Evaluation and Intervention:   Psychosocial Re-Evaluation:   Psychosocial Discharge (Final Psychosocial Re-Evaluation):   Vocational Rehabilitation: Provide vocational rehab assistance to qualifying candidates.   Vocational Rehab Evaluation & Intervention: Vocational Rehab - 11/10/18 1525      Initial Vocational Rehab Evaluation & Intervention   Assessment shows need for Vocational Rehabilitation  No       Education: Education Goals: Education classes will be provided on a variety of topics geared toward better understanding of heart health and risk factor modification. Participant will state understanding/return demonstration of topics presented as noted by education test scores.  Learning Barriers/Preferences: Learning Barriers/Preferences - 11/10/18 1522      Learning Barriers/Preferences   Learning Barriers  None    Learning Preferences  Individual Instruction       Education Topics:  AED/CPR: - Group verbal and written instruction with the use of models to demonstrate the basic use of the AED with the basic ABC's of resuscitation.   General Nutrition Guidelines/Fats and Fiber: -Group instruction provided by verbal, written  material, models and posters to present the general guidelines for heart healthy nutrition. Gives an explanation and review of dietary fats and fiber.   Controlling Sodium/Reading Food Labels: -Group verbal and written material supporting the discussion of sodium use in heart healthy nutrition. Review and explanation with models, verbal and written materials for utilization of the food label.   Exercise Physiology & General Exercise Guidelines: - Group verbal and written instruction with models to review the exercise physiology of the cardiovascular system and associated critical values. Provides general exercise guidelines with specific guidelines to those with heart or lung disease.    Aerobic Exercise & Resistance Training: - Gives group verbal and written instruction on the various components of exercise. Focuses on aerobic and resistive training programs and the benefits of this training and how to safely progress through these programs..   Flexibility, Balance, Mind/Body Relaxation: Provides group verbal/written instruction on the benefits of flexibility and balance training, including mind/body exercise modes such as yoga, pilates and tai chi.  Demonstration and skill practice provided.   Stress and Anxiety: - Provides group verbal and written instruction about the health risks of elevated stress and causes of high stress.  Discuss the correlation between heart/lung disease and anxiety and treatment options. Review healthy ways to manage with stress and anxiety.   Depression: - Provides group verbal and written instruction on the correlation between heart/lung disease and depressed mood, treatment options, and the stigmas associated with seeking treatment.   Anatomy & Physiology of the Heart: - Group verbal and written instruction and models provide basic cardiac anatomy and physiology, with the coronary electrical and arterial systems. Review of Valvular disease and Heart  Failure   Cardiac Procedures: - Group verbal and written instruction to review commonly prescribed medications for heart disease. Reviews the medication, class of the drug, and side effects. Includes the steps to properly store meds and maintain the prescription regimen. (beta blockers  and nitrates)   Cardiac Medications I: - Group verbal and written instruction to review commonly prescribed medications for heart disease. Reviews the medication, class of the drug, and side effects. Includes the steps to properly store meds and maintain the prescription regimen.   Cardiac Medications II: -Group verbal and written instruction to review commonly prescribed medications for heart disease. Reviews the medication, class of the drug, and side effects. (all other drug classes)    Go Sex-Intimacy & Heart Disease, Get SMART - Goal Setting: - Group verbal and written instruction through game format to discuss heart disease and the return to sexual intimacy. Provides group verbal and written material to discuss and apply goal setting through the application of the S.M.A.R.T. Method.   Other Matters of the Heart: - Provides group verbal, written materials and models to describe Stable Angina and Peripheral Artery. Includes description of the disease process and treatment options available to the cardiac patient.   Exercise & Equipment Safety: - Individual verbal instruction and demonstration of equipment use and safety with use of the equipment.   Cardiac Rehab from 11/10/2018 in [facility] Cardiac and Pulmonary Rehab  Date  11/10/18  Educator  [facility]  Instruction Review Code  1- Verbalizes Understanding      Infection Prevention: - Provides verbal and written material to individual with discussion of infection control including proper hand washing and proper equipment cleaning during exercise session.   Cardiac Rehab from 11/10/2018 in [facility] Cardiac and Pulmonary Rehab  Date  11/10/18  Educator  [facility]   Instruction Review Code  1- Verbalizes Understanding      Falls Prevention: - Provides verbal and written material to individual with discussion of falls prevention and safety.   Cardiac Rehab from 11/10/2018 in [facility] Cardiac and Pulmonary Rehab  Date  11/10/18  Educator  [facility]  Instruction Review Code  1- Verbalizes Understanding      Diabetes: - Individual verbal and written instruction to review signs/symptoms of diabetes, desired ranges of glucose level fasting, after meals and with exercise. Acknowledge that pre and post exercise glucose checks will be done for 3 sessions at entry of program.   Cardiac Rehab from 11/10/2018 in [facility] Cardiac and Pulmonary Rehab  Date  11/10/18  Educator  [facility]  Instruction Review Code  1- Verbalizes Understanding      Know Your Numbers and Risk Factors: -Group verbal and written instruction about important numbers in your health.  Discussion of what are risk factors and how they play a role in the disease process.  Review of Cholesterol, Blood Pressure, Diabetes, and BMI and the role they play in your overall health.   Sleep Hygiene: -Provides group verbal and written instruction about how sleep can affect your health.  Define sleep hygiene, discuss sleep cycles and impact of sleep habits. Review good sleep hygiene tips.    Other: -Provides group and verbal instruction on various topics (see comments)   Knowledge Questionnaire Score: Knowledge Questionnaire Score - 11/10/18 1522      Knowledge Questionnaire Score   Pre Score  20/26   Correct answers reviewed with pt, focused on depression/stress, nutrition, and tabacco      Core Components/Risk Factors/Patient Goals at Admission: Personal Goals and Risk Factors at Admission - 11/10/18 1526      Core Components/Risk Factors/Patient Goals on Admission    Weight Management  Yes;Weight Loss;Weight Maintenance    Intervention  Weight Management: Develop a combined nutrition and exercise program  designed to reach desired caloric  intake, while maintaining appropriate intake of nutrient and fiber, sodium and fats, and appropriate energy expenditure required for the weight goal.;Weight Management: Provide education and appropriate resources to help participant work on and attain dietary goals.;Weight Management/Obesity: Establish reasonable short term and long term weight goals.    Admit Weight  209 lb (94.8 kg)    Goal Weight: Short Term  205 lb (93 kg)    Goal Weight: Long Term  200 lb (90.7 kg)    Expected Outcomes  Short Term: Continue to assess and modify interventions until short term weight is achieved;Long Term: Adherence to nutrition and physical activity/exercise program aimed toward attainment of established weight goal;Weight Loss: Understanding of general recommendations for a balanced deficit meal plan, which promotes 1-2 lb weight loss per week and includes a negative energy balance of [phone] kcal/d;Understanding of distribution of calorie intake throughout the day with the consumption of 4-5 meals/snacks    Tobacco Cessation  Yes    Intervention  Assist the participant in steps to quit. Provide individualized education and counseling about committing to Tobacco Cessation, relapse prevention, and pharmacological support that can be provided by physician.;Advice worker, assist with locating and accessing local/national Quit Smoking programs, and support quit date choice.    Expected Outcomes  Short Term: Will demonstrate readiness to quit, by selecting a quit date.;Short Term: Will quit all tobacco product use, adhering to prevention of relapse plan.;Long Term: Complete abstinence from all tobacco products for at least 12 months from quit date.    Diabetes  Yes    Intervention  Provide education about signs/symptoms and action to take for hypo/hyperglycemia.;Provide education about proper nutrition, including hydration, and aerobic/resistive exercise prescription along  with prescribed medications to achieve blood glucose in normal ranges: Fasting glucose 65-99 mg/dL    Expected Outcomes  Short Term: Participant verbalizes understanding of the signs/symptoms and immediate care of hyper/hypoglycemia, proper foot care and importance of medication, aerobic/resistive exercise and nutrition plan for blood glucose control.;Long Term: Attainment of HbA1C < 7%.    Heart Failure  Yes    Intervention  Provide a combined exercise and nutrition program that is supplemented with education, support and counseling about heart failure. Directed toward relieving symptoms such as shortness of breath, decreased exercise tolerance, and extremity edema.    Expected Outcomes  Improve functional capacity of life;Short term: Attendance in program 2-3 days a week with increased exercise capacity. Reported lower sodium intake. Reported increased fruit and vegetable intake. Reports medication compliance.;Short term: Daily weights obtained and reported for increase. Utilizing diuretic protocols set by physician.;Long term: Adoption of self-care skills and reduction of barriers for early signs and symptoms recognition and intervention leading to self-care maintenance.    Hypertension  Yes    Intervention  Provide education on lifestyle modifcations including regular physical activity/exercise, weight management, moderate sodium restriction and increased consumption of fresh fruit, vegetables, and low fat dairy, alcohol moderation, and smoking cessation.;Monitor prescription use compliance.    Expected Outcomes  Short Term: Continued assessment and intervention until BP is < 140/69m HG in hypertensive participants. < 130/826mHG in hypertensive participants with diabetes, heart failure or chronic kidney disease.;Long Term: Maintenance of blood pressure at goal levels.    Lipids  Yes    Intervention  Provide education and support for participant on nutrition & aerobic/resistive exercise along with  prescribed medications to achieve LDL <7071mHDL >18m23m  Expected Outcomes  Short Term: Participant states understanding of desired cholesterol values and is  compliant with medications prescribed. Participant is following exercise prescription and nutrition guidelines.;Long Term: Cholesterol controlled with medications as prescribed, with individualized exercise RX and with personalized nutrition plan. Value goals: LDL < 59m, HDL > 40 mg.       Core Components/Risk Factors/Patient Goals Review:    Core Components/Risk Factors/Patient Goals at Discharge (Final Review):    ITP Comments: ITP Comments    Row Name 11/10/18 1457           ITP Comments  Med Review completed. Initial ITP created, [facility]           Comments: Initial ITP

## 2018-11-10 NOTE — Progress Notes (Signed)
11/11/2018  10:53 AM   Casey Peters 09/07/1946 [id number]  Referring provider: Gracelyn Nurse, MD [address] [place], [place] [address]  Chief Complaint  Patient presents with  . Dysuria    HPI: Casey Peters is a 73 y.o. 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 Mena Goes for chronic bilateral thoracic back pain.  UA today (11/11/2018) found BLO 1+, LEU 2+, WBC 6-10, RBC 0-2, epithelial cells 0-10, and few bacteria.  Patient reports that he had been having an uncomfortable feeling and burning but that those symptoms ceased as of last night.  Denies chills, fever, nausea, gross hematuria, vomiting, and trouble urinating.  Patient denies having had stones in the past.  Patient reports that other urinary symptoms are his baseline.  PMH: Past Medical History:  Diagnosis Date  . BP (high blood pressure) 06/08/2014  . CHF (congestive heart failure) (HCC)   . Constipation   . Diabetes (HCC)   . HLD (hyperlipidemia)   . HTN (hypertension)   . Rhinitis     Surgical History: Past Surgical History:  Procedure Laterality Date  . HERNIA REPAIR  1998  . PILONIDAL CYST EXCISION    . TONSILLECTOMY    . URETHRA SURGERY  1995    Home Medications:  Allergies as of 11/11/2018   No Known Allergies     Medication List       Accurate as of November 11, 2018 10:53 AM. Always use your most recent med list.        aspirin EC 81 MG tablet Take 81 mg by mouth daily.   carvedilol 3.125 MG tablet Commonly known as:  COREG Take 3.125 mg by mouth 2 (two) times daily.   furosemide 40 MG tablet Commonly known as:  LASIX Take 40 mg by mouth 2 (two) times daily.   metFORMIN 1000 MG tablet Commonly known as:  GLUCOPHAGE Take 500 mg by mouth daily with breakfast.   ONE TOUCH ULTRA  TEST test strip Generic drug:  glucose blood USE 1 STRIP 3 TIMES A DAY TO CHECK BLOOD GLUCOSE LEVELS   spironolactone 25 MG tablet Commonly known as:  ALDACTONE Take 25 mg by mouth daily.   testosterone 50 MG/5GM (1%) Gel Commonly known as:  ANDROGEL Apply 5 g topically daily.       Allergies: No Known Allergies  Family History: Family History  Problem Relation Age of Onset  . Hematuria Father   . Kidney disease Neg Hx   . Prostate cancer Neg Hx     Social History:  reports that he has been smoking cigars. He has never used smokeless tobacco. He reports that he does not drink alcohol or use drugs.  ROS: UROLOGY Frequent Urination?: Yes Hard to postpone urination?: Yes Burning/pain with urination?: Yes Get up at night to urinate?: Yes Leakage of urine?: Yes Urine stream starts and stops?: Yes Trouble starting stream?: No Do you have to strain to urinate?: No Blood in urine?: No Urinary tract infection?: No Sexually transmitted disease?: No Injury to kidneys or bladder?: No Painful intercourse?: No Weak stream?: Yes Erection problems?: No Penile pain?: No  Gastrointestinal Nausea?: No Vomiting?: No Indigestion/heartburn?: No Diarrhea?: No Constipation?: No  Constitutional Fever: No Night sweats?: No Weight loss?: No Fatigue?: No  Skin Skin rash/lesions?: No Itching?: No  Eyes  Blurred vision?: No Double vision?: No  Ears/Nose/Throat Sore throat?: No Sinus problems?: Yes  Hematologic/Lymphatic Swollen glands?: No Easy bruising?: No  Cardiovascular Leg swelling?: No Chest pain?: No  Respiratory Cough?: No Shortness of breath?: No  Endocrine Excessive thirst?: Yes  Musculoskeletal Back pain?: No Joint pain?: Yes  Neurological Headaches?: No Dizziness?: No  Psychologic Depression?: No Anxiety?: No  Physical Exam: BP 102/67   Pulse 91   Ht 5\' 11"  (1.803 m)   Wt 208 lb (94.3 kg)   BMI 29.01 kg/m   Constitutional:  Well  nourished. Alert and oriented, No acute distress. Cardiovascular: No clubbing, cyanosis, or edema. Respiratory: Normal respiratory effort, no increased work of breathing. GU: No CVA tenderness.  No bladder fullness or masses.  Patient with circumcised phallus.  Urethral meatus had scarring.  No penile discharge. No penile lesions or rashes. Scrotum without lesions, cysts, rashes and/or edema.  Testicles are located scrotally bilaterally. No masses are appreciated in the testicles. Left and right epididymis are normal. Rectal: Patient with  normal sphincter tone. Anus and perineum without scarring or rashes. No rectal masses are appreciated. Prostate is approximately 45 grams, no nodules are appreciated. Seminal vesicles are normal. Skin: No rashes, bruises or suspicious lesions. Neurologic: Grossly intact, no focal deficits, moving all 4 extremities. Psychiatric: Normal mood and affect.  Laboratory Data:  PSA Trend 06/08/2014 1.06 06/27/2015 3.17 07/10/2015 2.1 03/03/2016 1.4 06/29/2016 2.13 04/29/2017 1.0 07/29/2017 8.62 09/19/2018 1.67  I have reviewed the labs.   Assessment & Plan:    1. Possible UTI - Symptoms appear to have resolved on their own - Send urine off to culture - Will call patient Monday with results  Return for pending urine culture .  Duanne MoronKatharine Samson  [facility] [address], [address] [place], [place] [address] [phone]  I, Duanne MoronKatharine Samson, am acting as a Neurosurgeonscribe for [employer] CorporationShannon Ahlivia Salahuddin, PA-C.   I have reviewed the above documentation for accuracy and completeness, and I agree with the above.    Michiel CowboyShannon Kaiyana Bedore, PA-C

## 2018-11-10 NOTE — Progress Notes (Signed)
Daily Session Note  Patient Details  Name: Casey Peters MRN: [id number] Date of Birth: 1946/04/27 Referring Provider:     Cardiac Rehab from 11/10/2018 in [facility] Cardiac and Pulmonary Rehab  Referring Provider  Fath      Encounter Date: 11/10/2018  Check In: Session Check In - 11/10/18 1452      Check-In   Supervising physician immediately available to respond to emergencies  See telemetry face sheet for immediately available ER MD    Location  [facility]-Cardiac & Pulmonary Rehab    Staff Present  Renita Papa, RN BSN;Glyn Gerads RN, Vickki Hearing, BA, ACSM CEP, Exercise Physiologist    Tobacco Cessation  No Change    Warm-up and Cool-down  Not performed (comment)   Med Review completed   Resistance Training Performed  Yes    VAD Patient?  No    PAD/SET Patient?  No      Pain Assessment   Currently in Pain?  No/denies        Exercise Prescription Changes - 11/10/18 1500      Response to Exercise   Blood Pressure (Admit)  98/68    Blood Pressure (Exercise)  114/68    Blood Pressure (Exit)  124/64    Heart Rate (Admit)  101 bpm    Heart Rate (Exercise)  111 bpm    Heart Rate (Exit)  100 bpm    Oxygen Saturation (Admit)  96 %    Oxygen Saturation (Exit)  98 %    Rating of Perceived Exertion (Exercise)  9       Social History   Tobacco Use  Smoking Status Current Every Day Smoker  . Types: Cigars  Smokeless Tobacco Never Used  Tobacco Comment   2-3 cigars per day    Goals Met:  Exercise tolerated well Personal goals reviewed No report of cardiac concerns or symptoms Strength training completed today  Goals Unmet:  Not Applicable  Comments: Med Review completed.   Dr. Emily Filbert is Medical Director for [place] and [facility].

## 2018-11-10 NOTE — Patient Instructions (Signed)
Patient Instructions  Patient Details  Name: Casey Peters MRN: [id number] Date of Birth: 1946/03/15 Referring Provider:  Dalia Heading, MD  Below are your personal goals for exercise, nutrition, and risk factors. Our goal is to help you stay on track towards obtaining and maintaining these goals. We will be discussing your progress on these goals with you throughout the program.  Initial Exercise Prescription: Initial Exercise Prescription - 11/10/18 1500      Date of Initial Exercise RX and Referring Provider   Date  11/10/18    Referring Provider  Fath      Treadmill   MPH  2    Grade  0.5    Minutes  15    METs  2.5      Recumbant Bike   Level  3    RPM  60    Watts  22    Minutes  15    METs  2.5      NuStep   Level  2    SPM  80    Minutes  15    METs  2.5      Prescription Details   Frequency (times per week)  3    Duration  Progress to 45 minutes of aerobic exercise without signs/symptoms of physical distress      Intensity   THRR 40-80% of Max Heartrate  117-138    Ratings of Perceived Exertion  11-15    Perceived Dyspnea  0-4      Resistance Training   Training Prescription  Yes    Weight  3 lb    Reps  10-15       Exercise Goals: Frequency: Be able to perform aerobic exercise two to three times per week in program working toward 2-5 days per week of home exercise.  Intensity: Work with a perceived exertion of 11 (fairly light) - 15 (hard) while following your exercise prescription.  We will make changes to your prescription with you as you progress through the program.   Duration: Be able to do 30 to 45 minutes of continuous aerobic exercise in addition to a 5 minute warm-up and a 5 minute cool-down routine.   Nutrition Goals: Your personal nutrition goals will be established when you do your nutrition analysis with the dietician.  The following are general nutrition guidelines to follow: Cholesterol < 200mg /day Sodium <  1500mg /day Fiber: Men over 50 yrs - 30 grams per day  Personal Goals: Personal Goals and Risk Factors at Admission - 11/10/18 1526      Core Components/Risk Factors/Patient Goals on Admission    Weight Management  Yes;Weight Loss;Weight Maintenance    Intervention  Weight Management: Develop a combined nutrition and exercise program designed to reach desired caloric intake, while maintaining appropriate intake of nutrient and fiber, sodium and fats, and appropriate energy expenditure required for the weight goal.;Weight Management: Provide education and appropriate resources to help participant work on and attain dietary goals.;Weight Management/Obesity: Establish reasonable short term and long term weight goals.    Admit Weight  209 lb (94.8 kg)    Goal Weight: Short Term  205 lb (93 kg)    Goal Weight: Long Term  200 lb (90.7 kg)    Expected Outcomes  Short Term: Continue to assess and modify interventions until short term weight is achieved;Long Term: Adherence to nutrition and physical activity/exercise program aimed toward attainment of established weight goal;Weight Loss: Understanding of general recommendations for a balanced deficit meal  plan, which promotes 1-2 lb weight loss per week and includes a negative energy balance of [phone] kcal/d;Understanding of distribution of calorie intake throughout the day with the consumption of 4-5 meals/snacks    Tobacco Cessation  Yes    Intervention  Assist the participant in steps to quit. Provide individualized education and counseling about committing to Tobacco Cessation, relapse prevention, and pharmacological support that can be provided by physician.;Education officer, environmentalffer self-teaching materials, assist with locating and accessing local/national Quit Smoking programs, and support quit date choice.    Expected Outcomes  Short Term: Will demonstrate readiness to quit, by selecting a quit date.;Short Term: Will quit all tobacco product use, adhering to prevention  of relapse plan.;Long Term: Complete abstinence from all tobacco products for at least 12 months from quit date.    Diabetes  Yes    Intervention  Provide education about signs/symptoms and action to take for hypo/hyperglycemia.;Provide education about proper nutrition, including hydration, and aerobic/resistive exercise prescription along with prescribed medications to achieve blood glucose in normal ranges: Fasting glucose 65-99 mg/dL    Expected Outcomes  Short Term: Participant verbalizes understanding of the signs/symptoms and immediate care of hyper/hypoglycemia, proper foot care and importance of medication, aerobic/resistive exercise and nutrition plan for blood glucose control.;Long Term: Attainment of HbA1C < 7%.    Heart Failure  Yes    Intervention  Provide a combined exercise and nutrition program that is supplemented with education, support and counseling about heart failure. Directed toward relieving symptoms such as shortness of breath, decreased exercise tolerance, and extremity edema.    Expected Outcomes  Improve functional capacity of life;Short term: Attendance in program 2-3 days a week with increased exercise capacity. Reported lower sodium intake. Reported increased fruit and vegetable intake. Reports medication compliance.;Short term: Daily weights obtained and reported for increase. Utilizing diuretic protocols set by physician.;Long term: Adoption of self-care skills and reduction of barriers for early signs and symptoms recognition and intervention leading to self-care maintenance.    Hypertension  Yes    Intervention  Provide education on lifestyle modifcations including regular physical activity/exercise, weight management, moderate sodium restriction and increased consumption of fresh fruit, vegetables, and low fat dairy, alcohol moderation, and smoking cessation.;Monitor prescription use compliance.    Expected Outcomes  Short Term: Continued assessment and intervention until  BP is < 140/7490mm HG in hypertensive participants. < 130/580mm HG in hypertensive participants with diabetes, heart failure or chronic kidney disease.;Long Term: Maintenance of blood pressure at goal levels.    Lipids  Yes    Intervention  Provide education and support for participant on nutrition & aerobic/resistive exercise along with prescribed medications to achieve LDL 70mg , HDL >40mg .    Expected Outcomes  Short Term: Participant states understanding of desired cholesterol values and is compliant with medications prescribed. Participant is following exercise prescription and nutrition guidelines.;Long Term: Cholesterol controlled with medications as prescribed, with individualized exercise RX and with personalized nutrition plan. Value goals: LDL < 70mg , HDL > 40 mg.       Tobacco Use Initial Evaluation: Social History   Tobacco Use  Smoking Status Current Every Day Smoker  . Types: Cigars  Smokeless Tobacco Never Used  Tobacco Comment   2-3 cigars per day    Exercise Goals and Review: Exercise Goals    Row Name 11/10/18 1457             Exercise Goals   Increase Physical Activity  Yes       Intervention  Provide advice,  education, support and counseling about physical activity/exercise needs.;Develop an individualized exercise prescription for aerobic and resistive training based on initial evaluation findings, risk stratification, comorbidities and participant's personal goals.       Expected Outcomes  Short Term: Attend rehab on a regular basis to increase amount of physical activity.;Long Term: Add in home exercise to make exercise part of routine and to increase amount of physical activity.;Long Term: Exercising regularly at least 3-5 days a week.       Increase Strength and Stamina  Yes       Intervention  Provide advice, education, support and counseling about physical activity/exercise needs.;Develop an individualized exercise prescription for aerobic and resistive  training based on initial evaluation findings, risk stratification, comorbidities and participant's personal goals.       Expected Outcomes  Short Term: Increase workloads from initial exercise prescription for resistance, speed, and METs.;Short Term: Perform resistance training exercises routinely during rehab and add in resistance training at home;Long Term: Improve cardiorespiratory fitness, muscular endurance and strength as measured by increased METs and functional capacity ( )       Able to understand and use rate of perceived exertion (RPE) scale  Yes       Intervention  Provide education and explanation on how to use RPE scale       Expected Outcomes  Short Term: Able to use RPE daily in rehab to express subjective intensity level;Long Term:  Able to use RPE to guide intensity level when exercising independently       Able to understand and use Dyspnea scale  Yes       Intervention  Provide education and explanation on how to use Dyspnea scale       Expected Outcomes  Short Term: Able to use Dyspnea scale daily in rehab to express subjective sense of shortness of breath during exertion;Long Term: Able to use Dyspnea scale to guide intensity level when exercising independently       Knowledge and understanding of Target Heart Rate Range (THRR)  Yes       Intervention  Provide education and explanation of THRR including how the numbers were predicted and where they are located for reference       Expected Outcomes  Short Term: Able to state/look up THRR;Short Term: Able to use daily as guideline for intensity in rehab;Long Term: Able to use THRR to govern intensity when exercising independently       Able to check pulse independently  Yes       Intervention  Provide education and demonstration on how to check pulse in carotid and radial arteries.;Review the importance of being able to check your own pulse for safety during independent exercise       Expected Outcomes  Short Term: Able to  explain why pulse checking is important during independent exercise;Long Term: Able to check pulse independently and accurately       Understanding of Exercise Prescription  Yes       Intervention  Provide education, explanation, and written materials on patient's individual exercise prescription       Expected Outcomes  Short Term: Able to explain program exercise prescription;Long Term: Able to explain home exercise prescription to exercise independently          Copy of goals given to participant.

## 2018-11-11 ENCOUNTER — Ambulatory Visit (INDEPENDENT_AMBULATORY_CARE_PROVIDER_SITE_OTHER): Payer: Medicare Other | Admitting: Urology

## 2018-11-11 ENCOUNTER — Encounter: Payer: Self-pay | Admitting: Urology

## 2018-11-11 VITALS — BP 102/67 | HR 91 | Ht 71.0 in | Wt 208.0 lb

## 2018-11-11 DIAGNOSIS — N390 Urinary tract infection, site not specified: Secondary | ICD-10-CM | POA: Diagnosis not present

## 2018-11-11 DIAGNOSIS — R300 Dysuria: Secondary | ICD-10-CM | POA: Diagnosis not present

## 2018-11-11 LAB — MICROSCOPIC EXAMINATION

## 2018-11-11 LAB — URINALYSIS, COMPLETE
Bilirubin, UA: NEGATIVE
Glucose, UA: NEGATIVE
Ketones, UA: NEGATIVE
Nitrite, UA: NEGATIVE
Protein, UA: NEGATIVE
Specific Gravity, UA: 1.02 (ref 1.005–1.030)
Urobilinogen, Ur: 0.2 mg/dL (ref 0.2–1.0)
pH, UA: 5.5 (ref 5.0–7.5)

## 2018-11-11 LAB — BLADDER SCAN AMB NON-IMAGING

## 2018-11-13 LAB — CULTURE, URINE COMPREHENSIVE

## 2018-11-14 ENCOUNTER — Telehealth: Payer: Self-pay

## 2018-11-14 DIAGNOSIS — A498 Other bacterial infections of unspecified site: Secondary | ICD-10-CM

## 2018-11-14 MED ORDER — SULFAMETHOXAZOLE-TRIMETHOPRIM 800-160 MG PO TABS: 1.0000 | ORAL_TABLET | Freq: Two times a day (BID) | ORAL | 0 refills | Status: DC

## 2018-11-14 NOTE — Telephone Encounter (Signed)
Called pt informed him of the information below. Pt gave verbal understanding. RX sent in.  °

## 2018-11-14 NOTE — Telephone Encounter (Signed)
-----   Message from Harle Battiest, PA-C sent at 11/14/2018  8:52 AM EST ----- Please let Casey Peters know that his urine culture was positive for infection.  We need him to start him on Septra DS, twice daily for seven days.

## 2018-11-16 ENCOUNTER — Encounter: Payer: Medicare Other | Attending: Cardiology

## 2018-11-16 DIAGNOSIS — I5022 Chronic systolic (congestive) heart failure: Secondary | ICD-10-CM | POA: Diagnosis not present

## 2018-11-16 LAB — GLUCOSE, CAPILLARY
Glucose-Capillary: 146 mg/dL — ABNORMAL HIGH (ref 70–99)
Glucose-Capillary: 128 mg/dL — ABNORMAL HIGH (ref 70–99)

## 2018-11-16 NOTE — Progress Notes (Signed)
Daily Session Note  Patient Details  Name: Casey Peters MRN: [id number] Date of Birth: 1945-11-14 Referring Provider:     Cardiac Rehab from 11/10/2018 in [facility] Cardiac and Pulmonary Rehab  Referring Provider  Fath      Encounter Date: 11/16/2018  Check In: Session Check In - 11/16/18 0824      Check-In   Supervising physician immediately available to respond to emergencies  See telemetry face sheet for immediately available ER MD    Location  [facility]-Cardiac & Pulmonary Rehab    Staff Present  Heath Lark, RN, BSN, CCRP;Jessica [place], MA, RCEP, CCRP, Exercise Physiologist;Derrell Milanes Tessie Fass RCP,RRT,BSRT    Medication changes reported      No    Fall or balance concerns reported     No    Warm-up and Cool-down  Performed as group-led instruction    Resistance Training Performed  Yes    VAD Patient?  No    PAD/SET Patient?  No      Pain Assessment   Currently in Pain?  No/denies    Multiple Pain Sites  No          Social History   Tobacco Use  Smoking Status Current Every Day Smoker  . Types: Cigars  Smokeless Tobacco Never Used  Tobacco Comment   2-3 cigars per day    Goals Met:  Exercise tolerated well Personal goals reviewed No report of cardiac concerns or symptoms Strength training completed today  Goals Unmet:  Not Applicable  Comments: First full day of exercise!  Patient was oriented to gym and equipment including functions, settings, policies, and procedures.  Patient's individual exercise prescription and treatment plan were reviewed.  All starting workloads were established based on the results of the 6 minute walk test done at initial orientation visit.  The plan for exercise progression was also introduced and progression will be customized based on patient's performance and goals.    Dr. Emily Filbert is Medical Director for [place] and [facility].

## 2018-11-18 ENCOUNTER — Encounter: Payer: Medicare Other | Admitting: *Deleted

## 2018-11-18 DIAGNOSIS — I5022 Chronic systolic (congestive) heart failure: Secondary | ICD-10-CM

## 2018-11-18 LAB — GLUCOSE, CAPILLARY
Glucose-Capillary: 136 mg/dL — ABNORMAL HIGH (ref 70–99)
Glucose-Capillary: 125 mg/dL — ABNORMAL HIGH (ref 70–99)

## 2018-11-18 NOTE — Progress Notes (Signed)
Daily Session Note  Patient Details  Name: Casey Peters MRN: [id number] Date of Birth: Mar 25, 1946 Referring Provider:     Cardiac Rehab from 11/10/2018 in [facility] Cardiac [facility] Rehab  Referring Provider  Fath      Encounter Date: 11/18/2018  Check In: Session Check In - 11/18/18 [place]      Check-In   Supervising physician immediately available to respond to emergencies  See telemetry face sheet for immediately available ER MD    Location  [facility]-Cardiac & Pulmonary Rehab    Staff Present  Renita Papa, RN BSN;Gearlene Godsil Luan Pulling, MA, RCEP, CCRP, Exercise Physiologist;Amanda Oletta Darter, [place], ACSM CEP, Exercise Physiologist    Medication changes reported      No    Fall or balance concerns reported     No    Warm-up and Cool-down  Performed as group-led instruction    Resistance Training Performed  Yes    VAD Patient?  No    PAD/SET Patient?  No      Pain Assessment   Currently in Pain?  No/denies          Social History   Tobacco Use  Smoking Status Current Every Day Smoker  . Types: Cigars  Smokeless Tobacco Never Used  Tobacco Comment   2-3 cigars per day    Goals Met:  Exercise tolerated well No report of cardiac concerns or symptoms Strength training completed today  Goals Unmet:  Not Applicable  Comments: Pt able to follow exercise prescription today without complaint.  Will continue to monitor for progression.    Dr. Emily Filbert is Medical Director for [place] and [facility].

## 2018-11-21 ENCOUNTER — Encounter: Payer: Medicare Other | Admitting: *Deleted

## 2018-11-21 DIAGNOSIS — I5022 Chronic systolic (congestive) heart failure: Secondary | ICD-10-CM | POA: Diagnosis not present

## 2018-11-21 LAB — GLUCOSE, CAPILLARY: Glucose-Capillary: 127 mg/dL — ABNORMAL HIGH (ref 70–99)

## 2018-11-21 NOTE — Progress Notes (Signed)
Daily Session Note  Patient Details  Name: Casey Peters MRN: [id number] Date of Birth: 09/22/1946 Referring Provider:     Cardiac Rehab from 11/10/2018 in [facility] Cardiac and Pulmonary Rehab  Referring Provider  Fath      Encounter Date: 11/21/2018  Check In: Session Check In - 11/21/18 0758      Check-In   Supervising physician immediately available to respond to emergencies  See telemetry face sheet for immediately available ER MD    Location  [facility]-Cardiac & Pulmonary Rehab    Staff Present  Earlean Shawl, BS, ACSM CEP, Exercise Physiologist;Susanne Bice, RN, BSN, CCRP;Jessica Bryant, MA, RCEP, CCRP, Exercise Physiologist    Medication changes reported      No    Fall or balance concerns reported     No    Tobacco Cessation  No Change    Warm-up and Cool-down  Performed as group-led instruction    Resistance Training Performed  Yes    VAD Patient?  No    PAD/SET Patient?  No      Pain Assessment   Currently in Pain?  No/denies    Multiple Pain Sites  No          Social History   Tobacco Use  Smoking Status Current Every Day Smoker  . Types: Cigars  Smokeless Tobacco Never Used  Tobacco Comment   2-3 cigars per day    Goals Met:  Independence with exercise equipment Exercise tolerated well No report of cardiac concerns or symptoms Strength training completed today  Goals Unmet:  Not Applicable  Comments: Pt able to follow exercise prescription today without complaint.  Will continue to monitor for progression.    Dr. Emily Filbert is Medical Director for [place] and [facility].

## 2018-11-23 ENCOUNTER — Encounter: Payer: Medicare Other | Admitting: *Deleted

## 2018-11-23 DIAGNOSIS — I5022 Chronic systolic (congestive) heart failure: Secondary | ICD-10-CM | POA: Diagnosis not present

## 2018-11-23 NOTE — Progress Notes (Signed)
Daily Session Note  Patient Details  Name: Casey Peters MRN: [id number] Date of Birth: July 06, 1946 Referring Provider:     Cardiac Rehab from 11/10/2018 in [facility] Cardiac and Pulmonary Rehab  Referring Provider  Fath      Encounter Date: 11/23/2018  Check In: Session Check In - 11/23/18 0735      Check-In   Supervising physician immediately available to respond to emergencies  See telemetry face sheet for immediately available ER MD    Location  [facility]-Cardiac & Pulmonary Rehab    Staff Present  Heath Lark, RN, BSN, CCRP;Joseph Darrin Nipper, [place], [place], [place], Exercise Physiologist    Medication changes reported      No    Fall or balance concerns reported     No    Warm-up and Cool-down  Performed as group-led instruction    Resistance Training Performed  Yes    VAD Patient?  No    PAD/SET Patient?  No      Pain Assessment   Currently in Pain?  No/denies          Social History   Tobacco Use  Smoking Status Current Every Day Smoker  . Types: Cigars  Smokeless Tobacco Never Used  Tobacco Comment   2-3 cigars per day    Goals Met:  Independence with exercise equipment Exercise tolerated well No report of cardiac concerns or symptoms Strength training completed today  Goals Unmet:  Not Applicable  Comments: Pt able to follow exercise prescription today without complaint.  Will continue to monitor for progression.    Dr. Emily Filbert is Medical Director for [place] and [facility].

## 2018-11-25 ENCOUNTER — Encounter: Payer: Medicare Other | Admitting: *Deleted

## 2018-11-25 DIAGNOSIS — I5022 Chronic systolic (congestive) heart failure: Secondary | ICD-10-CM

## 2018-11-25 NOTE — Progress Notes (Signed)
Daily Session Note  Patient Details  Name: Casey Peters MRN: [id number] Date of Birth: 1946-07-16 Referring Provider:     Cardiac Rehab from 11/10/2018 in [facility] Cardiac and Pulmonary Rehab  Referring Provider  Fath      Encounter Date: 11/25/2018  Check In: Session Check In - 11/25/18 0752      Check-In   Supervising physician immediately available to respond to emergencies  See telemetry face sheet for immediately available ER MD    Location  [facility]-Cardiac & Pulmonary Rehab    Staff Present  Renita Papa, RN BSN;Jeanna Durrell BS, Exercise Physiologist;Zyden Suman Luan Pulling, MA, RCEP, CCRP, Exercise Physiologist    Medication changes reported      No    Fall or balance concerns reported     No    Warm-up and Cool-down  Performed as group-led instruction    Resistance Training Performed  Yes    VAD Patient?  No    PAD/SET Patient?  No      Pain Assessment   Currently in Pain?  No/denies          Social History   Tobacco Use  Smoking Status Current Every Day Smoker  . Types: Cigars  Smokeless Tobacco Never Used  Tobacco Comment   2-3 cigars per day    Goals Met:  Independence with exercise equipment Exercise tolerated well Personal goals reviewed No report of cardiac concerns or symptoms Strength training completed today  Goals Unmet:  Not Applicable  Comments: Pt able to follow exercise prescription today without complaint.  Will continue to monitor for progression.  Reviewed home exercise with pt today.  Pt plans to walking at home for exercise.   He has a 0.6 mile loop that he walks.  We talked about increasing his time to 30 min.  Reviewed THR, pulse, RPE, sign and symptoms, NTG use, and when to call 911 or MD.  Also discussed weather considerations and indoor options.  Pt voiced understanding.   Dr. Emily Filbert is Medical Director for [place] and [facility].

## 2018-11-28 ENCOUNTER — Encounter: Payer: Medicare Other | Admitting: *Deleted

## 2018-11-28 DIAGNOSIS — I5022 Chronic systolic (congestive) heart failure: Secondary | ICD-10-CM

## 2018-11-28 NOTE — Progress Notes (Signed)
Daily Session Note  Patient Details  Name: Casey Peters MRN: [id number] Date of Birth: 1946-01-02 Referring Provider:     Cardiac Rehab from 11/10/2018 in [facility] Cardiac and Pulmonary Rehab  Referring Provider  Fath      Encounter Date: 11/28/2018  Check In: Session Check In - 11/28/18 0749      Check-In   Supervising physician immediately available to respond to emergencies  See telemetry face sheet for immediately available ER MD    Location  [facility]-Cardiac & Pulmonary Rehab    Staff Present  Earlean Shawl, BS, ACSM CEP, Exercise Physiologist;Susanne Bice, RN, BSN, CCRP;Jessica [place], MA, RCEP, CCRP, Exercise Physiologist    Medication changes reported      No    Fall or balance concerns reported     No    Tobacco Cessation  No Change    Warm-up and Cool-down  Performed as group-led instruction    Resistance Training Performed  Yes    VAD Patient?  No    PAD/SET Patient?  No      Pain Assessment   Currently in Pain?  No/denies    Multiple Pain Sites  No          Social History   Tobacco Use  Smoking Status Current Every Day Smoker  . Types: Cigars  Smokeless Tobacco Never Used  Tobacco Comment   2-3 cigars per day    Goals Met:  Independence with exercise equipment Exercise tolerated well No report of cardiac concerns or symptoms Strength training completed today  Goals Unmet:  Not Applicable  Comments: Pt able to follow exercise prescription today without complaint.  Will continue to monitor for progression.    Dr. Emily Filbert is Medical Director for [place] [facility].

## 2018-11-30 DIAGNOSIS — I5022 Chronic systolic (congestive) heart failure: Secondary | ICD-10-CM | POA: Diagnosis not present

## 2018-11-30 NOTE — Progress Notes (Signed)
Daily Session Note  Patient Details  Name: ENIO HORNBACK MRN: [id number] Date of Birth: [birth year] Referring Provider:     Cardiac Rehab from 11/10/2018 in [facility] Cardiac and Pulmonary Rehab  Referring Provider  Fath      Encounter Date: 11/30/2018  Check In: Session Check In - 11/30/18 0742      Check-In   Supervising physician immediately available to respond to emergencies  See telemetry face sheet for immediately available ER MD    Location  [facility]-Cardiac & Pulmonary Rehab    Staff Present  Justin Mend RCP,RRT,BSRT;Jessica Luan Pulling, MA, RCEP, CCRP, Exercise Physiologist;Susanne Bice, RN, BSN, CCRP    Medication changes reported      No    Fall or balance concerns reported     No    Warm-up and Cool-down  Performed as group-led instruction    Resistance Training Performed  Yes    VAD Patient?  No    PAD/SET Patient?  No      Pain Assessment   Currently in Pain?  No/denies          Social History   Tobacco Use  Smoking Status Current Every Day Smoker  . Types: Cigars  Smokeless Tobacco Never Used  Tobacco Comment   2-3 cigars per day    Goals Met:  Independence with exercise equipment Exercise tolerated well No report of cardiac concerns or symptoms Strength training completed today  Goals Unmet:  Not Applicable  Comments: Pt able to follow exercise prescription today without complaint.  Will continue to monitor for progression.    Dr. Emily Filbert is Medical Director for [place] and [facility].

## 2018-12-05 ENCOUNTER — Encounter: Payer: Medicare Other | Admitting: *Deleted

## 2018-12-05 DIAGNOSIS — I5022 Chronic systolic (congestive) heart failure: Secondary | ICD-10-CM | POA: Diagnosis not present

## 2018-12-05 NOTE — Progress Notes (Signed)
Daily Session Note  Patient Details  Name: KYAIRE GRUENEWALD MRN: [id number] Date of Birth: 24-Dec-1945 Referring Provider:     Cardiac Rehab from 11/10/2018 in [facility] Cardiac and Pulmonary Rehab  Referring Provider  Fath      Encounter Date: 12/05/2018  Check In: Session Check In - 12/05/18 0804      Check-In   Supervising physician immediately available to respond to emergencies  See telemetry face sheet for immediately available ER MD    Location  [facility]-Cardiac & Pulmonary Rehab    Staff Present  Earlean Shawl, BS, ACSM CEP, Exercise Physiologist;Jessica Luan Pulling, MA, RCEP, CCRP, Exercise Physiologist;Susanne Bice, RN, BSN, CCRP    Medication changes reported      No    Fall or balance concerns reported     No    Tobacco Cessation  No Change    Warm-up and Cool-down  Performed as group-led instruction    Resistance Training Performed  Yes    VAD Patient?  No    PAD/SET Patient?  No      Pain Assessment   Currently in Pain?  No/denies    Multiple Pain Sites  No          Social History   Tobacco Use  Smoking Status Current Every Day Smoker  . Types: Cigars  Smokeless Tobacco Never Used  Tobacco Comment   2-3 cigars per day    Goals Met:  Independence with exercise equipment Exercise tolerated well No report of cardiac concerns or symptoms Strength training completed today  Goals Unmet:  Not Applicable  Comments: Pt able to follow exercise prescription today without complaint.  Will continue to monitor for progression.    Dr. Emily Filbert is Medical Director for [place] and [facility].

## 2018-12-07 ENCOUNTER — Encounter: Payer: Self-pay | Admitting: *Deleted

## 2018-12-07 DIAGNOSIS — I5022 Chronic systolic (congestive) heart failure: Secondary | ICD-10-CM | POA: Diagnosis not present

## 2018-12-07 NOTE — Progress Notes (Signed)
Cardiac Individual Treatment Plan  Patient Details  Name: Casey Peters MRN: [id number] Date of Birth: [birth year] Referring Provider:     Cardiac Rehab from 11/10/2018 in [facility] Cardiac and Pulmonary Rehab  Referring Provider  Fath      Initial Encounter Date:    Cardiac Rehab from 11/10/2018 in [facility] Cardiac and Pulmonary Rehab  Date  11/10/18      Visit Diagnosis: Heart failure, chronic systolic ([place])  Patient's Home Medications on Admission:  Current Outpatient Medications:  .  aspirin EC 81 MG tablet, Take 81 mg by mouth daily., Disp: , Rfl:  .  carvedilol (COREG) 3.125 MG tablet, Take 3.125 mg by mouth 2 (two) times daily., Disp: , Rfl:  .  furosemide (LASIX) 40 MG tablet, Take 40 mg by mouth 2 (two) times daily., Disp: , Rfl:  .  metFORMIN (GLUCOPHAGE) 1000 MG tablet, Take 500 mg by mouth daily with breakfast. , Disp: , Rfl:  .  ONE TOUCH ULTRA TEST test strip, USE 1 STRIP 3 TIMES A DAY TO CHECK BLOOD GLUCOSE LEVELS, Disp: , Rfl:  .  spironolactone (ALDACTONE) 25 MG tablet, Take 25 mg by mouth daily., Disp: , Rfl:  .  sulfamethoxazole-trimethoprim (BACTRIM DS,SEPTRA DS) 800-160 MG tablet, Take 1 tablet by mouth 2 (two) times daily., Disp: 14 tablet, Rfl: 0 .  testosterone (ANDROGEL) 50 MG/5GM (1%) GEL, Apply 5 g topically daily., Disp: , Rfl:   Past Medical History: Past Medical History:  Diagnosis Date  . BP (high blood pressure) 06/08/2014  . CHF (congestive heart failure) ([place])   . Constipation   . Diabetes ([place])   . HLD (hyperlipidemia)   . HTN (hypertension)   . Rhinitis     Tobacco Use: Social History   Tobacco Use  Smoking Status Current Every Day Smoker  . Types: Cigars  Smokeless Tobacco Never Used  Tobacco Comment   2-3 cigars per day    Labs: Recent Review Flowsheet Data    There is no flowsheet data to display.       Exercise Target Goals: Exercise Program Goal: Individual exercise prescription set using results from initial 6 min walk  test and THRR while considering  patient's activity barriers and safety.   Exercise Prescription Goal: Initial exercise prescription builds to 30-45 minutes a day of aerobic activity, 2-3 days per week.  Home exercise guidelines will be given to patient during program as part of exercise prescription that the participant will acknowledge.  Activity Barriers & Risk Stratification: Activity Barriers & Cardiac Risk Stratification - 11/10/18 1506      Activity Barriers & Cardiac Risk Stratification   Activity Barriers  None    Cardiac Risk Stratification  High       6 Minute Walk: 6 Minute Walk    Row Name 11/10/18 1457         6 Minute Walk   Phase  Initial     Distance  1270 feet     Walk Time  6 minutes     # of Rest Breaks  0     MPH  2.4     METS  2.7     RPE  9     VO2 Peak  9.5     Symptoms  No     Resting HR  96 bpm     Resting BP  98/68     Resting Oxygen Saturation   96 %     Exercise Oxygen Saturation  during 6  min walk  98 %     Max Ex. HR  111 bpm     Max Ex. BP  114/68     2 Minute Post BP  124/64        Oxygen Initial Assessment:   Oxygen Re-Evaluation:   Oxygen Discharge (Final Oxygen Re-Evaluation):   Initial Exercise Prescription: Initial Exercise Prescription - 11/10/18 1500      Date of Initial Exercise RX and Referring Provider   Date  11/10/18    Referring Provider  Fath      Treadmill   MPH  2    Grade  0.5    Minutes  15    METs  2.5      Recumbant Bike   Level  3    RPM  60    Watts  22    Minutes  15    METs  2.5      NuStep   Level  2    SPM  80    Minutes  15    METs  2.5      Prescription Details   Frequency (times per week)  3    Duration  Progress to 45 minutes of aerobic exercise without signs/symptoms of physical distress      Intensity   THRR 40-80% of Max Heartrate  117-138    Ratings of Perceived Exertion  11-15    Perceived Dyspnea  0-4      Resistance Training   Training Prescription  Yes    Weight   3 lb    Reps  10-15       Perform Capillary Blood Glucose checks as needed.  Exercise Prescription Changes: Exercise Prescription Changes    Row Name 11/10/18 1500 12/01/18 0800           Response to Exercise   Blood Pressure (Admit)  98/68  132/74      Blood Pressure (Exercise)  114/68  124/74      Blood Pressure (Exit)  124/64  108/60      Heart Rate (Admit)  101 bpm  93 bpm      Heart Rate (Exercise)  111 bpm  115 bpm      Heart Rate (Exit)  100 bpm  100 bpm      Oxygen Saturation (Admit)  96 %  -      Oxygen Saturation (Exit)  98 %  -      Rating of Perceived Exertion (Exercise)  9  15      Symptoms  -  none      Duration  -  Continue with 45 min of aerobic exercise without signs/symptoms of physical distress.      Intensity  -  THRR unchanged        Progression   Progression  -  Continue to progress workloads to maintain intensity without signs/symptoms of physical distress.      Average METs  -  2.87        Resistance Training   Training Prescription  -  Yes      Weight  -  3 lb      Reps  -  10-15        Interval Training   Interval Training  -  No        Treadmill   MPH  -  1.5      Grade  -  1      Minutes  -  15      METs  -  2.36        Recumbant Bike   Level  -  3      Watts  -  34      Minutes  -  15      METs  -  3.16        NuStep   Level  -  4      Minutes  -  15      METs  -  3.1        Home Exercise Plan   Plans to continue exercise at  -  Home (comment) walking      Frequency  -  Add 2 additional days to program exercise sessions.      Initial Home Exercises Provided  -  11/25/18         Exercise Comments: Exercise Comments    Row Name 11/16/18 0825           Exercise Comments  First full day of exercise!  Patient was oriented to gym and equipment including functions, settings, policies, and procedures.  Patient's individual exercise prescription and treatment plan were reviewed.  All starting workloads were established based  on the results of the 6 minute walk test done at initial orientation visit.  The plan for exercise progression was also introduced and progression will be customized based on patient's performance and goals.          Exercise Goals and Review: Exercise Goals    Row Name 11/10/18 1457             Exercise Goals   Increase Physical Activity  Yes       Intervention  Provide advice, education, support and counseling about physical activity/exercise needs.;Develop an individualized exercise prescription for aerobic and resistive training based on initial evaluation findings, risk stratification, comorbidities and participant's personal goals.       Expected Outcomes  Short Term: Attend rehab on a regular basis to increase amount of physical activity.;Long Term: Add in home exercise to make exercise part of routine and to increase amount of physical activity.;Long Term: Exercising regularly at least 3-5 days a week.       Increase Strength and Stamina  Yes       Intervention  Provide advice, education, support and counseling about physical activity/exercise needs.;Develop an individualized exercise prescription for aerobic and resistive training based on initial evaluation findings, risk stratification, comorbidities and participant's personal goals.       Expected Outcomes  Short Term: Increase workloads from initial exercise prescription for resistance, speed, and METs.;Short Term: Perform resistance training exercises routinely during rehab and add in resistance training at home;Long Term: Improve cardiorespiratory fitness, muscular endurance and strength as measured by increased METs and functional capacity (6MWT)       Able to understand and use rate of perceived exertion (RPE) scale  Yes       Intervention  Provide education and explanation on how to use RPE scale       Expected Outcomes  Short Term: Able to use RPE daily in rehab to express subjective intensity level;Long Term:  Able to use RPE  to guide intensity level when exercising independently       Able to understand and use Dyspnea scale  Yes       Intervention  Provide education and explanation on how to use Dyspnea scale       Expected Outcomes  Short Term: Able to use Dyspnea scale daily in rehab to express subjective sense of shortness of breath during exertion;Long Term: Able to use Dyspnea scale to guide intensity level when exercising independently       Knowledge and understanding of Target Heart Rate Range (THRR)  Yes       Intervention  Provide education and explanation of THRR including how the numbers were predicted and where they are located for reference       Expected Outcomes  Short Term: Able to state/look up THRR;Short Term: Able to use daily as guideline for intensity in rehab;Long Term: Able to use THRR to govern intensity when exercising independently       Able to check pulse independently  Yes       Intervention  Provide education and demonstration on how to check pulse in carotid and radial arteries.;Review the importance of being able to check your own pulse for safety during independent exercise       Expected Outcomes  Short Term: Able to explain why pulse checking is important during independent exercise;Long Term: Able to check pulse independently and accurately       Understanding of Exercise Prescription  Yes       Intervention  Provide education, explanation, and written materials on patient's individual exercise prescription       Expected Outcomes  Short Term: Able to explain program exercise prescription;Long Term: Able to explain home exercise prescription to exercise independently          Exercise Goals Re-Evaluation : Exercise Goals Re-Evaluation    Row Name 11/16/18 0825 11/25/18 0809 12/01/18 0827         Exercise Goal Re-Evaluation   Exercise Goals Review  Able to understand and use rate of perceived exertion (RPE) scale;Knowledge and understanding of Target Heart Rate Range  (THRR);Understanding of Exercise Prescription  Increase Physical Activity;Increase Strength and Stamina;Able to understand and use rate of perceived exertion (RPE) scale;Knowledge and understanding of Target Heart Rate Range (THRR);Understanding of Exercise Prescription;Able to check pulse independently  Increase Physical Activity;Increase Strength and Stamina;Understanding of Exercise Prescription     Comments  Reviewed RPE scale, THR and program prescription with pt today.  Pt voiced understanding and was given a copy of goals to take home.   [name] is off to a good start in rehab.  He struggled his first couple of days and needed a nap after class.  This week he has been able to go and go without his nap.  Reviewed home exercise with pt today.  Pt plans to walking at home for exercise.   He has a 0.6 mile loop that he walks.  We talked about increasing his time to 30 min.  Reviewed THR, pulse, RPE, sign and symptoms, NTG use, and when to call 911 or MD.  Also discussed weather considerations and indoor options.  Pt voiced understanding.  [name] is doing well in rehab. He has already started to add in his home exercise and trying to increase to two laps at home without getting too tired.  He is up to 34 watts on the bike.  We will continue to monitor his progress.      Expected Outcomes  Short: Use RPE daily to regulate intensity. Long: Follow program prescription in THR.  Short: Start to increase walking time to 30 min.  Long: Continue to increase strength and stamina.   Short: Continue to increase workloads. Long: Continue to increase strength and stamina.  Discharge Exercise Prescription (Final Exercise Prescription Changes): Exercise Prescription Changes - 12/01/18 0800      Response to Exercise   Blood Pressure (Admit)  132/74    Blood Pressure (Exercise)  124/74    Blood Pressure (Exit)  108/60    Heart Rate (Admit)  93 bpm    Heart Rate (Exercise)  115 bpm    Heart Rate (Exit)  100 bpm     Rating of Perceived Exertion (Exercise)  15    Symptoms  none    Duration  Continue with 45 min of aerobic exercise without signs/symptoms of physical distress.    Intensity  THRR unchanged      Progression   Progression  Continue to progress workloads to maintain intensity without signs/symptoms of physical distress.    Average METs  2.87      Resistance Training   Training Prescription  Yes    Weight  3 lb    Reps  10-15      Interval Training   Interval Training  No      Treadmill   MPH  1.5    Grade  1    Minutes  15    METs  2.36      Recumbant Bike   Level  3    Watts  34    Minutes  15    METs  3.16      NuStep   Level  4    Minutes  15    METs  3.1      Home Exercise Plan   Plans to continue exercise at  Home (comment)   walking   Frequency  Add 2 additional days to program exercise sessions.    Initial Home Exercises Provided  11/25/18       Nutrition:  Target Goals: Understanding of nutrition guidelines, daily intake of sodium <1582m, cholesterol <2074m calories 30% from fat and 7% or less from saturated fats, daily to have 5 or more servings of fruits and vegetables.  Biometrics: Pre Biometrics - 11/10/18 1456      Pre Biometrics   Height  5' 11" (1.803 m)    Weight  209 lb (94.8 kg)    Waist Circumference  42.25 inches    Hip Circumference  38 inches    Waist to Hip Ratio  1.11 %    BMI (Calculated)  29.16    Single Leg Stand  6.5 seconds        Nutrition Therapy Plan and Nutrition Goals: Nutrition Therapy & Goals - 11/10/18 1519      Nutrition Therapy   Diet  low sodium keto diet with fluid restriction      Intervention Plan   Intervention  Prescribe, educate and counsel regarding individualized specific dietary modifications aiming towards targeted core components such as weight, hypertension, lipid management, diabetes, heart failure and other comorbidities.    Expected Outcomes  Short Term Goal: Understand basic principles of  dietary content, such as calories, fat, sodium, cholesterol and nutrients.;Short Term Goal: A plan has been developed with personal nutrition goals set during dietitian appointment.;Long Term Goal: Adherence to prescribed nutrition plan.       Nutrition Assessments: Nutrition Assessments - 11/10/18 1520      MEDFICTS Scores   Pre Score  42       Nutrition Goals Re-Evaluation: Nutrition Goals Re-Evaluation    [place] 11/25/18 0800             Goals  Nutrition Goal  Heart healthy, low salt, lean proteins       Comment  [name] is following the keto diet.  His wife does most of the shopping and she does check food labels.  They try to eat as much fresh food as possible.  He watches his salt intake closely as well his fluid intake.  They are trying to stick to leaner meats.  They eat a lot of hamburger steak.  We talked about making sure they are getting the lower fat version. He was set on following keto and eating fat despite his heart.        Expected Outcome  Short: Try for lower saturated fats and wath fluid intake.  Long: Continue to eat fresh foods.           Nutrition Goals Discharge (Final Nutrition Goals Re-Evaluation): Nutrition Goals Re-Evaluation - 11/25/18 0800      Goals   Nutrition Goal  Heart healthy, low salt, lean proteins    Comment  [name] is following the keto diet.  His wife does most of the shopping and she does check food labels.  They try to eat as much fresh food as possible.  He watches his salt intake closely as well his fluid intake.  They are trying to stick to leaner meats.  They eat a lot of hamburger steak.  We talked about making sure they are getting the lower fat version. He was set on following keto and eating fat despite his heart.     Expected Outcome  Short: Try for lower saturated fats and wath fluid intake.  Long: Continue to eat fresh foods.        Psychosocial: Target Goals: Acknowledge presence or absence of significant depression and/or  stress, maximize coping skills, provide positive support system. Participant is able to verbalize types and ability to use techniques and skills needed for reducing stress and depression.   Initial Review & Psychosocial Screening: Initial Psych Review & Screening - 11/10/18 1511      Initial Review   Current issues with  None Identified      Family Dynamics   Good Support System?  Yes      Barriers   Psychosocial barriers to participate in program  The patient should benefit from training in stress management and relaxation.      Screening Interventions   Interventions  Encouraged to exercise;Program counselor consult;To provide support and resources with identified psychosocial needs;Provide feedback about the scores to participant    Expected Outcomes  Short Term goal: Utilizing psychosocial counselor, staff and physician to assist with identification of specific Stressors or current issues interfering with healing process. Setting desired goal for each stressor or current issue identified.;Long Term Goal: Stressors or current issues are controlled or eliminated.;Short Term goal: Identification and review with participant of any Quality of Life or Depression concerns found by scoring the questionnaire.;Long Term goal: The participant improves quality of Life and PHQ9 Scores as seen by post scores and/or verbalization of changes       Quality of Life Scores:  Quality of Life - 11/10/18 1511      Quality of Life   Select  Quality of Life      Quality of Life Scores   Health/Function Pre  18.83 %    Socioeconomic Pre  28 %    Psych/Spiritual Pre  23.79 %    Family Pre  25.5 %    GLOBAL Pre  22.56 %  Scores of 19 and below usually indicate a poorer quality of life in these areas.  A difference of  2-3 points is a clinically meaningful difference.  A difference of 2-3 points in the total score of the Quality of Life Index has been associated with significant improvement in overall  quality of life, self-image, physical symptoms, and general health in studies assessing change in quality of life.  PHQ-9: Recent Review Flowsheet Data    Depression screen [facility] 2/9 11/10/2018   Decreased Interest 0   Down, Depressed, Hopeless 0   PHQ - 2 Score 0   Altered sleeping 0   Tired, decreased energy 1   Change in appetite 0   Feeling bad or failure about yourself  0   Trouble concentrating 2   Moving slowly or fidgety/restless 0   Suicidal thoughts 0   PHQ-9 Score 3   Difficult doing work/chores Not difficult at all     Interpretation of Total Score  Total Score Depression Severity:  1-4 = Minimal depression, 5-9 = Mild depression, 10-14 = Moderate depression, 15-19 = Moderately severe depression, 20-27 = Severe depression   Psychosocial Evaluation and Intervention: Psychosocial Evaluation - 11/21/18 1035      Psychosocial Evaluation & Interventions   Interventions  Encouraged to exercise with the program and follow exercise prescription    Comments  Counselor met with Mr. Ryser [facility]) today for initial psychosocial evaluation. He is a 73 year old who has a history of CHG.  He has a strong support system with a spouse of 19 years and (4) adult children.  Other than his heart condition, he struggles with Diabetes.  [name] reports sleeping well with approximately 8 hours per night; and a good appetite.  He denies a history of depression or anxiety or any current symptoms and is typically in a positive mood.  He states his health is his primary stressor.  [name] has goals to walk better and improve his energy level.  He would love to have fewer dietary restrictions with salt intake.  He will be followed by staff.       Expected Outcomes  Short:  [name] will exercise consistently to improve his overall strength and energy.  Long:  [name] will develop a healthy lifestyle routine for his health and mental health.     Continue Psychosocial Services   Follow up required by staff        Psychosocial Re-Evaluation:   Psychosocial Discharge (Final Psychosocial Re-Evaluation):   Vocational Rehabilitation: Provide vocational rehab assistance to qualifying candidates.   Vocational Rehab Evaluation & Intervention: Vocational Rehab - 11/10/18 1525      Initial Vocational Rehab Evaluation & Intervention   Assessment shows need for Vocational Rehabilitation  No       Education: Education Goals: Education classes will be provided on a variety of topics geared toward better understanding of heart health and risk factor modification. Participant will state understanding/return demonstration of topics presented as noted by education test scores.  Learning Barriers/Preferences: Learning Barriers/Preferences - 11/10/18 1522      Learning Barriers/Preferences   Learning Barriers  None    Learning Preferences  Individual Instruction       Education Topics:  AED/CPR: - Group verbal and written instruction with the use of models to demonstrate the basic use of the AED with the basic ABC's of resuscitation.   Cardiac Rehab from 12/05/2018 in [facility] Cardiac and Pulmonary Rehab  Date  12/05/18  Educator  SB  Instruction  Review Code  1- Verbalizes Understanding      General Nutrition Guidelines/Fats and Fiber: -Group instruction provided by verbal, written material, models and posters to present the general guidelines for heart healthy nutrition. Gives an explanation and review of dietary fats and fiber.   Controlling Sodium/Reading Food Labels: -Group verbal and written material supporting the discussion of sodium use in heart healthy nutrition. Review and explanation with models, verbal and written materials for utilization of the food label.   Exercise Physiology & General Exercise Guidelines: - Group verbal and written instruction with models to review the exercise physiology of the cardiovascular system and associated critical values. Provides general exercise  guidelines with specific guidelines to those with heart or lung disease.    Aerobic Exercise & Resistance Training: - Gives group verbal and written instruction on the various components of exercise. Focuses on aerobic and resistive training programs and the benefits of this training and how to safely progress through these programs..   Flexibility, Balance, Mind/Body Relaxation: Provides group verbal/written instruction on the benefits of flexibility and balance training, including mind/body exercise modes such as yoga, pilates and tai chi.  Demonstration and skill practice provided.   Stress and Anxiety: - Provides group verbal and written instruction about the health risks of elevated stress and causes of high stress.  Discuss the correlation between heart/lung disease and anxiety and treatment options. Review healthy ways to manage with stress and anxiety.   Cardiac Rehab from 12/05/2018 in [facility] Cardiac and Pulmonary Rehab  Date  11/30/18  Educator  [facility]  Instruction Review Code  1- Verbalizes Understanding      Depression: - Provides group verbal and written instruction on the correlation between heart/lung disease and depressed mood, treatment options, and the stigmas associated with seeking treatment.   Anatomy & Physiology of the Heart: - Group verbal and written instruction and models provide basic cardiac anatomy and physiology, with the coronary electrical and arterial systems. Review of Valvular disease and Heart Failure   Cardiac Procedures: - Group verbal and written instruction to review commonly prescribed medications for heart disease. Reviews the medication, class of the drug, and side effects. Includes the steps to properly store meds and maintain the prescription regimen. (beta blockers and nitrates)   Cardiac Rehab from 12/05/2018 in [facility] Cardiac and Pulmonary Rehab  Date  11/28/18  Educator  SB  Instruction Review Code  1- Verbalizes Understanding      Cardiac  Medications I: - Group verbal and written instruction to review commonly prescribed medications for heart disease. Reviews the medication, class of the drug, and side effects. Includes the steps to properly store meds and maintain the prescription regimen.   Cardiac Rehab from 12/05/2018 in [facility] Cardiac and Pulmonary Rehab  Date  11/21/18  Educator  SB  Instruction Review Code  1- Verbalizes Understanding      Cardiac Medications II: -Group verbal and written instruction to review commonly prescribed medications for heart disease. Reviews the medication, class of the drug, and side effects. (all other drug classes)    Go Sex-Intimacy & Heart Disease, Get SMART - Goal Setting: - Group verbal and written instruction through game format to discuss heart disease and the return to sexual intimacy. Provides group verbal and written material to discuss and apply goal setting through the application of the S.M.A.R.T. Method.   Cardiac Rehab from 12/05/2018 in [facility] Cardiac and Pulmonary Rehab  Date  11/28/18  Educator  SB  Instruction Review Code  1- [employer]  Understanding      Other Matters of the Heart: - Provides group verbal, written materials and models to describe Stable Angina and Peripheral Artery. Includes description of the disease process and treatment options available to the cardiac patient.   Exercise & Equipment Safety: - Individual verbal instruction and demonstration of equipment use and safety with use of the equipment.   Cardiac Rehab from 12/05/2018 in [facility] Cardiac and Pulmonary Rehab  Date  11/10/18  Educator  [facility]  Instruction Review Code  1- Verbalizes Understanding      Infection Prevention: - Provides verbal and written material to individual with discussion of infection control including proper hand washing and proper equipment cleaning during exercise session.   Cardiac Rehab from 12/05/2018 in [facility] Cardiac and Pulmonary Rehab  Date  11/10/18  Educator  [facility]   Instruction Review Code  1- Verbalizes Understanding      Falls Prevention: - Provides verbal and written material to individual with discussion of falls prevention and safety.   Cardiac Rehab from 12/05/2018 in [facility] Cardiac and Pulmonary Rehab  Date  11/10/18  Educator  [facility]  Instruction Review Code  1- Verbalizes Understanding      Diabetes: - Individual verbal and written instruction to review signs/symptoms of diabetes, desired ranges of glucose level fasting, after meals and with exercise. Acknowledge that pre and post exercise glucose checks will be done for 3 sessions at entry of program.   Cardiac Rehab from 12/05/2018 in [facility] Cardiac and Pulmonary Rehab  Date  11/10/18  Educator  [facility]  Instruction Review Code  1- Verbalizes Understanding      Know Your Numbers and Risk Factors: -Group verbal and written instruction about important numbers in your health.  Discussion of what are risk factors and how they play a role in the disease process.  Review of Cholesterol, Blood Pressure, Diabetes, and BMI and the role they play in your overall health.   Sleep Hygiene: -Provides group verbal and written instruction about how sleep can affect your health.  Define sleep hygiene, discuss sleep cycles and impact of sleep habits. Review good sleep hygiene tips.    Cardiac Rehab from 12/05/2018 in [facility] Cardiac and Pulmonary Rehab  Date  11/16/18  Educator  [facility]  Instruction Review Code  1- Verbalizes Understanding      Other: -Provides group and verbal instruction on various topics (see comments)   Knowledge Questionnaire Score: Knowledge Questionnaire Score - 11/10/18 1522      Knowledge Questionnaire Score   Pre Score  20/26   Correct answers reviewed with pt, focused on depression/stress, nutrition, and tabacco      Core Components/Risk Factors/Patient Goals at Admission: Personal Goals and Risk Factors at Admission - 11/10/18 1526      Core Components/Risk Factors/Patient  Goals on Admission    Weight Management  Yes;Weight Loss;Weight Maintenance    Intervention  Weight Management: Develop a combined nutrition and exercise program designed to reach desired caloric intake, while maintaining appropriate intake of nutrient and fiber, sodium and fats, and appropriate energy expenditure required for the weight goal.;Weight Management: Provide education and appropriate resources to help participant work on and attain dietary goals.;Weight Management/Obesity: Establish reasonable short term and long term weight goals.    Admit Weight  209 lb (94.8 kg)    Goal Weight: Short Term  205 lb (93 kg)    Goal Weight: Long Term  200 lb (90.7 kg)    Expected Outcomes  Short Term: Continue to assess  and modify interventions until short term weight is achieved;Long Term: Adherence to nutrition and physical activity/exercise program aimed toward attainment of established weight goal;Weight Loss: Understanding of general recommendations for a balanced deficit meal plan, which promotes 1-2 lb weight loss per week and includes a negative energy balance of [phone] kcal/d;Understanding of distribution of calorie intake throughout the day with the consumption of 4-5 meals/snacks    Tobacco Cessation  Yes    Intervention  Assist the participant in steps to quit. Provide individualized education and counseling about committing to Tobacco Cessation, relapse prevention, and pharmacological support that can be provided by physician.;Advice worker, assist with locating and accessing local/national Quit Smoking programs, and support quit date choice.    Expected Outcomes  Short Term: Will demonstrate readiness to quit, by selecting a quit date.;Short Term: Will quit all tobacco product use, adhering to prevention of relapse plan.;Long Term: Complete abstinence from all tobacco products for at least 12 months from quit date.    Diabetes  Yes    Intervention  Provide education about  signs/symptoms and action to take for hypo/hyperglycemia.;Provide education about proper nutrition, including hydration, and aerobic/resistive exercise prescription along with prescribed medications to achieve blood glucose in normal ranges: Fasting glucose 65-99 mg/dL    Expected Outcomes  Short Term: Participant verbalizes understanding of the signs/symptoms and immediate care of hyper/hypoglycemia, proper foot care and importance of medication, aerobic/resistive exercise and nutrition plan for blood glucose control.;Long Term: Attainment of HbA1C < 7%.    Heart Failure  Yes    Intervention  Provide a combined exercise and nutrition program that is supplemented with education, support and counseling about heart failure. Directed toward relieving symptoms such as shortness of breath, decreased exercise tolerance, and extremity edema.    Expected Outcomes  Improve functional capacity of life;Short term: Attendance in program 2-3 days a week with increased exercise capacity. Reported lower sodium intake. Reported increased fruit and vegetable intake. Reports medication compliance.;Short term: Daily weights obtained and reported for increase. Utilizing diuretic protocols set by physician.;Long term: Adoption of self-care skills and reduction of barriers for early signs and symptoms recognition and intervention leading to self-care maintenance.    Hypertension  Yes    Intervention  Provide education on lifestyle modifcations including regular physical activity/exercise, weight management, moderate sodium restriction and increased consumption of fresh fruit, vegetables, and low fat dairy, alcohol moderation, and smoking cessation.;Monitor prescription use compliance.    Expected Outcomes  Short Term: Continued assessment and intervention until BP is < 140/19m HG in hypertensive participants. < 130/858mHG in hypertensive participants with diabetes, heart failure or chronic kidney disease.;Long Term: Maintenance  of blood pressure at goal levels.    Lipids  Yes    Intervention  Provide education and support for participant on nutrition & aerobic/resistive exercise along with prescribed medications to achieve LDL <7039mHDL >21m7m  Expected Outcomes  Short Term: Participant states understanding of desired cholesterol values and is compliant with medications prescribed. Participant is following exercise prescription and nutrition guidelines.;Long Term: Cholesterol controlled with medications as prescribed, with individualized exercise RX and with personalized nutrition plan. Value goals: LDL < 70mg58mL > 40 mg.       Core Components/Risk Factors/Patient Goals Review:  Goals and Risk Factor Review    Row Name 11/25/18 0753             Core Components/Risk Factors/Patient Goals Review   Personal Goals Review  Weight Management/Obesity;Diabetes;Hypertension;Heart Failure;Lipids  Review  [name] is off to a good start in rehab. Today, he noted that his heart rate was a little higher today and his blood pressure was lower than normal. He noted that his weight is up a pound as well as he weighs daily.  He does not feeling dizzy or lightheaded but does not some swelling with the extra weight on board.  We talked about using his extra lasix today to help get back down.  He normally checks his pressures daily at home and they were running steady until today.  Overally, his blood sugars have been doing better and staying steadier.  His last A1c was 6.4.  Normally, his weight is pretty steady and he is still trying to lose some more.        Expected Outcomes  Short: Continue to keep eye on heart rate and blood pressures. Long: Continue to manage heart failure.           Core Components/Risk Factors/Patient Goals at Discharge (Final Review):  Goals and Risk Factor Review - 11/25/18 0753      Core Components/Risk Factors/Patient Goals Review   Personal Goals Review  Weight  Management/Obesity;Diabetes;Hypertension;Heart Failure;Lipids    Review  [name] is off to a good start in rehab. Today, he noted that his heart rate was a little higher today and his blood pressure was lower than normal. He noted that his weight is up a pound as well as he weighs daily.  He does not feeling dizzy or lightheaded but does not some swelling with the extra weight on board.  We talked about using his extra lasix today to help get back down.  He normally checks his pressures daily at home and they were running steady until today.  Overally, his blood sugars have been doing better and staying steadier.  His last A1c was 6.4.  Normally, his weight is pretty steady and he is still trying to lose some more.     Expected Outcomes  Short: Continue to keep eye on heart rate and blood pressures. Long: Continue to manage heart failure.        ITP Comments: ITP Comments    Row Name 11/10/18 1457 12/07/18 0559         ITP Comments  Med Review completed. Initial ITP created, diagnosis can be found in [facility] encounter 12/16.   30 day review. Continue with ITP unless directed changes by Medical Director chart review.         Comments:

## 2018-12-07 NOTE — Progress Notes (Signed)
Daily Session Note  Patient Details  Name: Casey Peters MRN: [id number] Date of Birth: 05-04-1946 Referring Provider:     Cardiac Rehab from 11/10/2018 in [facility] Cardiac and Pulmonary Rehab  Referring Provider  Fath      Encounter Date: 12/07/2018  Check In: Session Check In - 12/07/18 0744      Check-In   Supervising physician immediately available to respond to emergencies  See telemetry face sheet for immediately available ER MD    Location  [facility]-Cardiac & Pulmonary Rehab    Staff Present  Justin Mend RCP,RRT,BSRT;Jessica Luan Pulling, MA, RCEP, CCRP, Exercise Physiologist;Susanne Bice, RN, BSN, CCRP    Medication changes reported      No    Fall or balance concerns reported     No    Warm-up and Cool-down  Performed as group-led instruction    Resistance Training Performed  Yes    VAD Patient?  No    PAD/SET Patient?  No      Pain Assessment   Currently in Pain?  No/denies          Social History   Tobacco Use  Smoking Status Current Every Day Smoker  . Types: Cigars  Smokeless Tobacco Never Used  Tobacco Comment   2-3 cigars per day    Goals Met:  Independence with exercise equipment Exercise tolerated well No report of cardiac concerns or symptoms Strength training completed today  Goals Unmet:  Not Applicable  Comments: Pt able to follow exercise prescription today without complaint.  Will continue to monitor for progression.    Dr. Emily Filbert is Medical Director for [place] and [facility].

## 2018-12-09 ENCOUNTER — Encounter: Payer: Medicare Other | Admitting: *Deleted

## 2018-12-09 DIAGNOSIS — I5022 Chronic systolic (congestive) heart failure: Secondary | ICD-10-CM | POA: Diagnosis not present

## 2018-12-09 NOTE — Progress Notes (Signed)
Daily Session Note  Patient Details  Name: Casey Peters MRN: [id number] Date of Birth: May 17, 1946 Referring Provider:     Cardiac Rehab from 11/10/2018 in [facility] Cardiac and Pulmonary Rehab  Referring Provider  Fath      Encounter Date: 12/09/2018  Check In: Session Check In - 12/09/18 3552      Check-In   Supervising physician immediately available to respond to emergencies  See telemetry face sheet for immediately available ER MD    Location  [facility]-Cardiac & Pulmonary Rehab    Staff Present  Renita Papa, RN Geralyn Corwin, RN BSN;Jessica [place], MA, RCEP, CCRP, Exercise Physiologist    Medication changes reported      No    Fall or balance concerns reported     No    Tobacco Cessation  No Change    Warm-up and Cool-down  Performed as group-led instruction    Resistance Training Performed  Yes    VAD Patient?  No    PAD/SET Patient?  No      Pain Assessment   Currently in Pain?  No/denies    Multiple Pain Sites  No          Social History   Tobacco Use  Smoking Status Current Every Day Smoker  . Types: Cigars  Smokeless Tobacco Never Used  Tobacco Comment   2-3 cigars per day    Goals Met:  Independence with exercise equipment Exercise tolerated well No report of cardiac concerns or symptoms Strength training completed today  Goals Unmet:  Not Applicable  Comments: Pt able to follow exercise prescription today without complaint.  Will continue to monitor for progression.    Dr. Emily Filbert is Medical Director for [place] and [facility].

## 2018-12-12 ENCOUNTER — Encounter: Payer: Medicare Other | Attending: Cardiology | Admitting: *Deleted

## 2018-12-12 DIAGNOSIS — I5022 Chronic systolic (congestive) heart failure: Secondary | ICD-10-CM | POA: Insufficient documentation

## 2018-12-12 NOTE — Progress Notes (Signed)
Daily Session Note  Patient Details  Name: Casey Peters MRN: [id number] Date of Birth: August 21, 1946 Referring Provider:     Cardiac Rehab from 11/10/2018 in [facility] Cardiac and Pulmonary Rehab  Referring Provider  Fath      Encounter Date: 12/12/2018  Check In: Session Check In - 12/12/18 0804      Check-In   Supervising physician immediately available to respond to emergencies  See telemetry face sheet for immediately available ER MD    Location  [facility]-Cardiac & Pulmonary Rehab    Staff Present  Earlean Shawl, BS, ACSM CEP, Exercise Physiologist;Susanne Bice, RN, BSN, CCRP;Jessica [place], MA, RCEP, CCRP, Exercise Physiologist    Medication changes reported      No    Fall or balance concerns reported     No    Tobacco Cessation  No Change    Warm-up and Cool-down  Performed as group-led instruction    Resistance Training Performed  Yes    VAD Patient?  No    PAD/SET Patient?  No      Pain Assessment   Currently in Pain?  No/denies    Multiple Pain Sites  No          Social History   Tobacco Use  Smoking Status Current Every Day Smoker  . Types: Cigars  Smokeless Tobacco Never Used  Tobacco Comment   2-3 cigars per day    Goals Met:  Independence with exercise equipment Exercise tolerated well No report of cardiac concerns or symptoms Strength training completed today  Goals Unmet:  Not Applicable  Comments: Pt able to follow exercise prescription today without complaint.  Will continue to monitor for progression.    Dr. Emily Filbert is Medical Director for [place] and [facility].

## 2018-12-14 DIAGNOSIS — I5022 Chronic systolic (congestive) heart failure: Secondary | ICD-10-CM | POA: Diagnosis not present

## 2018-12-14 NOTE — Progress Notes (Signed)
Daily Session Note  Patient Details  Name: Casey Peters MRN: [id number] Date of Birth: 02/27/1946 Referring Provider:     Cardiac Rehab from 11/10/2018 in [facility] Cardiac and Pulmonary Rehab  Referring Provider  Fath      Encounter Date: 12/14/2018  Check In: Session Check In - 12/14/18 2202      Check-In   Supervising physician immediately available to respond to emergencies  See telemetry face sheet for immediately available ER MD    Location  [facility]-Cardiac & Pulmonary Rehab    Staff Present  Justin Mend RCP,RRT,BSRT;Jessica Luan Pulling, MA, RCEP, CCRP, Exercise Physiologist;Susanne Bice, RN, BSN, CCRP    Medication changes reported      No    Fall or balance concerns reported     No    Warm-up and Cool-down  Performed as group-led instruction    Resistance Training Performed  Yes    VAD Patient?  No    PAD/SET Patient?  No      Pain Assessment   Currently in Pain?  No/denies          Social History   Tobacco Use  Smoking Status Current Every Day Smoker  . Types: Cigars  Smokeless Tobacco Never Used  Tobacco Comment   2-3 cigars per day    Goals Met:  Independence with exercise equipment Exercise tolerated well No report of cardiac concerns or symptoms Strength training completed today  Goals Unmet:  Not Applicable  Comments: Pt able to follow exercise prescription today without complaint.  Will continue to monitor for progression.    Dr. Emily Filbert is Medical Director for [place] and [facility].

## 2018-12-16 ENCOUNTER — Encounter: Payer: Medicare Other | Admitting: *Deleted

## 2018-12-16 DIAGNOSIS — I5022 Chronic systolic (congestive) heart failure: Secondary | ICD-10-CM

## 2018-12-16 NOTE — Progress Notes (Signed)
Daily Session Note  Patient Details  Name: Casey Peters MRN: [id number] Date of Birth: [birth year] Referring Provider:     Cardiac Rehab from 11/10/2018 in [facility] Cardiac and Pulmonary Rehab  Referring Provider  Fath      Encounter Date: 12/16/2018  Check In: Session Check In - 12/16/18 0953      Check-In   Supervising physician immediately available to respond to emergencies  See telemetry face sheet for immediately available ER MD    Location  [facility]-Cardiac & Pulmonary Rehab    Staff Present  Renita Papa, RN BSN;Hailei Besser Luan Pulling, MA, RCEP, CCRP, Exercise Physiologist;Amanda Oletta Darter, [place], ACSM CEP, Exercise Physiologist    Medication changes reported      No    Fall or balance concerns reported     No    Warm-up and Cool-down  Performed as group-led instruction    Resistance Training Performed  Yes    VAD Patient?  No    PAD/SET Patient?  No      Pain Assessment   Currently in Pain?  No/denies          Social History   Tobacco Use  Smoking Status Current Every Day Smoker  . Types: Cigars  Smokeless Tobacco Never Used  Tobacco Comment   2-3 cigars per day    Goals Met:  Independence with exercise equipment Exercise tolerated well No report of cardiac concerns or symptoms Strength training completed today  Goals Unmet:  Not Applicable  Comments: Pt able to follow exercise prescription today without complaint.  Will continue to monitor for progression.    Dr. Emily Filbert is Medical Director for [place] and [facility].

## 2018-12-19 ENCOUNTER — Encounter: Payer: Medicare Other | Admitting: *Deleted

## 2018-12-19 DIAGNOSIS — I5022 Chronic systolic (congestive) heart failure: Secondary | ICD-10-CM

## 2018-12-19 NOTE — Progress Notes (Signed)
Daily Session Note  Patient Details  Name: Casey Peters MRN: [id number] Date of Birth: [birth year] Referring Provider:     Cardiac Rehab from 11/10/2018 in [facility] Cardiac and Pulmonary Rehab  Referring Provider  Fath      Encounter Date: 12/19/2018  Check In: Session Check In - 12/19/18 0752      Check-In   Supervising physician immediately available to respond to emergencies  See telemetry face sheet for immediately available ER MD    Location  [facility]-Cardiac & Pulmonary Rehab    Staff Present  Heath Lark, RN, BSN, CCRP;Jessica [place], MA, RCEP, CCRP, Exercise Physiologist;Kelly Amedeo Plenty, BS, ACSM CEP, Exercise Physiologist    Medication changes reported      No    Fall or balance concerns reported     No    Warm-up and Cool-down  Performed as group-led instruction    Resistance Training Performed  Yes    VAD Patient?  No    PAD/SET Patient?  No      Pain Assessment   Currently in Pain?  No/denies          Social History   Tobacco Use  Smoking Status Current Every Day Smoker  . Types: Cigars  Smokeless Tobacco Never Used  Tobacco Comment   2-3 cigars per day    Goals Met:  Independence with exercise equipment Exercise tolerated well No report of cardiac concerns or symptoms Strength training completed today  Goals Unmet:  Not Applicable  Comments: Pt able to follow exercise prescription today without complaint.  Will continue to monitor for progression.    Dr. Emily Filbert is Medical Director for [place] and [facility].

## 2018-12-21 ENCOUNTER — Other Ambulatory Visit: Payer: Self-pay

## 2018-12-21 DIAGNOSIS — I5022 Chronic systolic (congestive) heart failure: Secondary | ICD-10-CM | POA: Diagnosis not present

## 2018-12-21 NOTE — Progress Notes (Signed)
Daily Session Note  Patient Details  Name: Casey Peters MRN: [id number] Date of Birth: Dec 15, 1945 Referring Provider:     Cardiac Rehab from 11/10/2018 in [facility] Cardiac and Pulmonary Rehab  Referring Provider  Fath      Encounter Date: 12/21/2018  Check In: Session Check In - 12/21/18 0726      Check-In   Supervising physician immediately available to respond to emergencies  See telemetry face sheet for immediately available ER MD    Staff Present  Heath Lark, RN, BSN, CCRP;Jessica [place], MA, RCEP, CCRP, Exercise Physiologist;Sela Falk Tessie Fass RCP,RRT,BSRT    Medication changes reported      No    Fall or balance concerns reported     No    Warm-up and Cool-down  Performed as group-led instruction    Resistance Training Performed  Yes    VAD Patient?  No    PAD/SET Patient?  No      Pain Assessment   Currently in Pain?  No/denies          Social History   Tobacco Use  Smoking Status Current Every Day Smoker  . Types: Cigars  Smokeless Tobacco Never Used  Tobacco Comment   2-3 cigars per day    Goals Met:  Independence with exercise equipment Exercise tolerated well No report of cardiac concerns or symptoms Strength training completed today  Goals Unmet:  Not Applicable  Comments: Pt able to follow exercise prescription today without complaint.  Will continue to monitor for progression.    Dr. Emily Filbert is Medical Director for [place] and [facility].

## 2018-12-23 ENCOUNTER — Other Ambulatory Visit: Payer: Self-pay

## 2018-12-23 ENCOUNTER — Encounter: Payer: Medicare Other | Admitting: *Deleted

## 2018-12-23 DIAGNOSIS — I5022 Chronic systolic (congestive) heart failure: Secondary | ICD-10-CM | POA: Diagnosis not present

## 2018-12-23 NOTE — Progress Notes (Signed)
Daily Session Note  Patient Details  Name: Casey Peters MRN: [id number] Date of Birth: [birth year] Referring Provider:     Cardiac Rehab from 11/10/2018 in [facility] Cardiac and Pulmonary Rehab  Referring Provider  Casey Peters      Encounter Date: 12/23/2018  Check In: Session Check In - 12/23/18 0810      Check-In   Supervising physician immediately available to respond to emergencies  See telemetry face sheet for immediately available ER MD    Location  [facility]-Cardiac & Pulmonary Rehab    Staff Present  Darel Hong, RN BSN;Jessica Luan Pulling, MA, RCEP, CCRP, Exercise Physiologist;Amanda Oletta Darter, [place], ACSM CEP, Exercise Physiologist    Medication changes reported      No    Fall or balance concerns reported     No    Tobacco Cessation  No Change    Warm-up and Cool-down  Performed as group-led instruction    Resistance Training Performed  Yes    VAD Patient?  No    PAD/SET Patient?  No      Pain Assessment   Currently in Pain?  No/denies          Social History   Tobacco Use  Smoking Status Current Every Day Smoker  . Types: Cigars  Smokeless Tobacco Never Used  Tobacco Comment   2-3 cigars per day    Goals Met:  Independence with exercise equipment Exercise tolerated well No report of cardiac concerns or symptoms Strength training completed today  Goals Unmet:  Not Applicable  Comments: Pt able to follow exercise prescription today without complaint.  Will continue to monitor for progression.    Dr. Emily Filbert is Medical Director for [place] and [facility].

## 2019-01-04 ENCOUNTER — Encounter: Payer: Self-pay | Admitting: *Deleted

## 2019-01-04 DIAGNOSIS — I5022 Chronic systolic (congestive) heart failure: Secondary | ICD-10-CM

## 2019-01-04 NOTE — Progress Notes (Signed)
Cardiac Individual Treatment Plan  Patient Details  Name: Casey Peters MRN: [id number] Date of Birth: 1945/11/21 Referring Provider:     Cardiac Rehab from 11/10/2018 in [facility] Cardiac and Pulmonary Rehab  Referring Provider  Fath      Initial Encounter Date:    Cardiac Rehab from 11/10/2018 in [facility] Cardiac and Pulmonary Rehab  Date  11/10/18      Visit Diagnosis: Heart failure, chronic systolic ([place])  Patient's Home Medications on Admission:  Current Outpatient Medications:  .  aspirin EC 81 MG tablet, Take 81 mg by mouth daily., Disp: , Rfl:  .  carvedilol (COREG) 3.125 MG tablet, Take 3.125 mg by mouth 2 (two) times daily., Disp: , Rfl:  .  furosemide (LASIX) 40 MG tablet, Take 40 mg by mouth 2 (two) times daily., Disp: , Rfl:  .  metFORMIN (GLUCOPHAGE) 1000 MG tablet, Take 500 mg by mouth daily with breakfast. , Disp: , Rfl:  .  ONE TOUCH ULTRA TEST test strip, USE 1 STRIP 3 TIMES A DAY TO CHECK BLOOD GLUCOSE LEVELS, Disp: , Rfl:  .  spironolactone (ALDACTONE) 25 MG tablet, Take 25 mg by mouth daily., Disp: , Rfl:  .  sulfamethoxazole-trimethoprim (BACTRIM DS,SEPTRA DS) 800-160 MG tablet, Take 1 tablet by mouth 2 (two) times daily., Disp: 14 tablet, Rfl: 0 .  testosterone (ANDROGEL) 50 MG/5GM (1%) GEL, Apply 5 g topically daily., Disp: , Rfl:   Past Medical History: Past Medical History:  Diagnosis Date  . BP (high blood pressure) 06/08/2014  . CHF (congestive heart failure) ([place])   . Constipation   . Diabetes ([place])   . HLD (hyperlipidemia)   . HTN (hypertension)   . Rhinitis     Tobacco Use: Social History   Tobacco Use  Smoking Status Current Every Day Smoker  . Types: Cigars  Smokeless Tobacco Never Used  Tobacco Comment   2-3 cigars per day    Labs: Recent Review Flowsheet Data    There is no flowsheet data to display.       Exercise Target Goals: Exercise Program Goal: Individual exercise prescription set using results from initial 6 min walk  test and THRR while considering  patient's activity barriers and safety.   Exercise Prescription Goal: Initial exercise prescription builds to 30-45 minutes a day of aerobic activity, 2-3 days per week.  Home exercise guidelines will be given to patient during program as part of exercise prescription that the participant will acknowledge.  Activity Barriers & Risk Stratification: Activity Barriers & Cardiac Risk Stratification - 11/10/18 1506      Activity Barriers & Cardiac Risk Stratification   Activity Barriers  None    Cardiac Risk Stratification  High       6 Minute Walk: 6 Minute Walk    Row Name 11/10/18 1457         6 Minute Walk   Phase  Initial     Distance  1270 feet     Walk Time  6 minutes     # of Rest Breaks  0     MPH  2.4     METS  2.7     RPE  9     VO2 Peak  9.5     Symptoms  No     Resting HR  96 bpm     Resting BP  98/68     Resting Oxygen Saturation   96 %     Exercise Oxygen Saturation  during 6  min walk  98 %     Max Ex. HR  111 bpm     Max Ex. BP  114/68     2 Minute Post BP  124/64        Oxygen Initial Assessment:   Oxygen Re-Evaluation:   Oxygen Discharge (Final Oxygen Re-Evaluation):   Initial Exercise Prescription: Initial Exercise Prescription - 11/10/18 1500      Date of Initial Exercise RX and Referring Provider   Date  11/10/18    Referring Provider  Fath      Treadmill   MPH  2    Grade  0.5    Minutes  15    METs  2.5      Recumbant Bike   Level  3    RPM  60    Watts  22    Minutes  15    METs  2.5      NuStep   Level  2    SPM  80    Minutes  15    METs  2.5      Prescription Details   Frequency (times per week)  3    Duration  Progress to 45 minutes of aerobic exercise without signs/symptoms of physical distress      Intensity   THRR 40-80% of Max Heartrate  117-138    Ratings of Perceived Exertion  11-15    Perceived Dyspnea  0-4      Resistance Training   Training Prescription  Yes    Weight   3 lb    Reps  10-15       Perform Capillary Blood Glucose checks as needed.  Exercise Prescription Changes: Exercise Prescription Changes    Row Name 11/10/18 1500 12/01/18 0800 12/14/18 1100 12/27/18 1600       Response to Exercise   Blood Pressure (Admit)  98/68  132/74  104/62  124/56    Blood Pressure (Exercise)  114/68  124/74  142/64  128/70    Blood Pressure (Exit)  124/64  108/60  100/60  126/74    Heart Rate (Admit)  101 bpm  93 bpm  96 bpm  93 bpm    Heart Rate (Exercise)  111 bpm  115 bpm  119 bpm  113 bpm    Heart Rate (Exit)  100 bpm  100 bpm  97 bpm  98 bpm    Oxygen Saturation (Admit)  96 %  -  -  -    Oxygen Saturation (Exit)  98 %  -  -  -    Rating of Perceived Exertion (Exercise)  _0 Symptoms  -  none  none  none    Duration  -  Continue with 45 min of aerobic exercise without signs/symptoms of physical distress.  Continue with 45 min of aerobic exercise without signs/symptoms of physical distress.  Continue with 45 min of aerobic exercise without signs/symptoms of physical distress.    Intensity  -  THRR unchanged  THRR unchanged  THRR unchanged      Progression   Progression  -  Continue to progress workloads to maintain intensity without signs/symptoms of physical distress.  Continue to progress workloads to maintain intensity without signs/symptoms of physical distress.  Continue to progress workloads to maintain intensity without signs/symptoms of physical distress.    Average METs  -  2.87  3.7  3.41      Resistance Training  Training Prescription  -  Yes  Yes  Yes    Weight  -  3 lb  3 lbs  3 lbs    Reps  -  10-15  10-15  10-15      Interval Training   Interval Training  -  No  No  No      Treadmill   MPH  -  1.5  2.1  2.5    Grade  -  1  1.5  1.5    Minutes  -  _0 METs  -  2.36  3.04  3.34      Recumbant Bike   Level  -  _1 Watts  -  34  -  -    Minutes  -  _2 METs  -  3.16  3.16  2.74       NuStep   Level  -  _3 Minutes  -  _4 METs  -  3.1  4.9  4.1      Home Exercise Plan   Plans to continue exercise at  -  Home (comment) walking  Home (comment) walking  Home (comment) walking    Frequency  -  Add 2 additional days to program exercise sessions.  Add 2 additional days to program exercise sessions.  Add 2 additional days to program exercise sessions.    Initial Home Exercises Provided  -  11/25/18  11/25/18  11/25/18       Exercise Comments: Exercise Comments    Row Name 11/16/18 0825           Exercise Comments  First full day of exercise!  Patient was oriented to gym and equipment including functions, settings, policies, and procedures.  Patient's individual exercise prescription and treatment plan were reviewed.  All starting workloads were established based on the results of the 6 minute walk test done at initial orientation visit.  The plan for exercise progression was also introduced and progression will be customized based on patient's performance and goals.          Exercise Goals and Review: Exercise Goals    Row Name 11/10/18 1457             Exercise Goals   Increase Physical Activity  Yes       Intervention  Provide advice, education, support and counseling about physical activity/exercise needs.;Develop an individualized exercise prescription for aerobic and resistive training based on initial evaluation findings, risk stratification, comorbidities and participant's personal goals.       Expected Outcomes  Short Term: Attend rehab on a regular basis to increase amount of physical activity.;Long Term: Add in home exercise to make exercise part of routine and to increase amount of physical activity.;Long Term: Exercising regularly at least 3-5 days a week.       Increase Strength and Stamina  Yes       Intervention  Provide advice, education, support and counseling about physical activity/exercise needs.;Develop an individualized exercise  prescription for aerobic and resistive training based on initial evaluation findings, risk stratification, comorbidities and participant's personal goals.       Expected Outcomes  Short Term: Increase workloads from initial exercise prescription for resistance, speed, and METs.;Short Term: Perform resistance training exercises routinely during rehab and add in resistance  training at home;Long Term: Improve cardiorespiratory fitness, muscular endurance and strength as measured by increased METs and functional capacity (6MWT)       Able to understand and use rate of perceived exertion (RPE) scale  Yes       Intervention  Provide education and explanation on how to use RPE scale       Expected Outcomes  Short Term: Able to use RPE daily in rehab to express subjective intensity level;Long Term:  Able to use RPE to guide intensity level when exercising independently       Able to understand and use Dyspnea scale  Yes       Intervention  Provide education and explanation on how to use Dyspnea scale       Expected Outcomes  Short Term: Able to use Dyspnea scale daily in rehab to express subjective sense of shortness of breath during exertion;Long Term: Able to use Dyspnea scale to guide intensity level when exercising independently       Knowledge and understanding of Target Heart Rate Range (THRR)  Yes       Intervention  Provide education and explanation of THRR including how the numbers were predicted and where they are located for reference       Expected Outcomes  Short Term: Able to state/look up THRR;Short Term: Able to use daily as guideline for intensity in rehab;Long Term: Able to use THRR to govern intensity when exercising independently       Able to check pulse independently  Yes       Intervention  Provide education and demonstration on how to check pulse in carotid and radial arteries.;Review the importance of being able to check your own pulse for safety during independent exercise        Expected Outcomes  Short Term: Able to explain why pulse checking is important during independent exercise;Long Term: Able to check pulse independently and accurately       Understanding of Exercise Prescription  Yes       Intervention  Provide education, explanation, and written materials on patient's individual exercise prescription       Expected Outcomes  Short Term: Able to explain program exercise prescription;Long Term: Able to explain home exercise prescription to exercise independently          Exercise Goals Re-Evaluation : Exercise Goals Re-Evaluation    Row Name 11/16/18 0825 11/25/18 0809 12/01/18 0827 12/14/18 1130 12/27/18 1614     Exercise Goal Re-Evaluation   Exercise Goals Review  Able to understand and use rate of perceived exertion (RPE) scale;Knowledge and understanding of Target Heart Rate Range (THRR);Understanding of Exercise Prescription  Increase Physical Activity;Increase Strength and Stamina;Able to understand and use rate of perceived exertion (RPE) scale;Knowledge and understanding of Target Heart Rate Range (THRR);Understanding of Exercise Prescription;Able to check pulse independently  Increase Physical Activity;Increase Strength and Stamina;Understanding of Exercise Prescription  Increase Physical Activity;Increase Strength and Stamina;Understanding of Exercise Prescription  Increase Physical Activity;Increase Strength and Stamina;Understanding of Exercise Prescription   Comments  Reviewed RPE scale, THR and program prescription with pt today.  Pt voiced understanding and was given a copy of goals to take home.   [name] is off to a good start in rehab.  He struggled his first couple of days and needed a nap after class.  This week he has been able to go and go without his nap.  Reviewed home exercise with pt today.  Pt plans to walking at home for exercise.  He has a 0.6 mile loop that he walks.  We talked about increasing his time to 30 min.  Reviewed THR, pulse, RPE,  sign and symptoms, NTG use, and when to call 911 or MD.  Also discussed weather considerations and indoor options.  Pt voiced understanding.  [name] is doing well in rehab. He has already started to add in his home exercise and trying to increase to two laps at home without getting too tired.  He is up to 34 watts on the bike.  We will continue to monitor his progress.   [name] has been doing well in rehab.  He continues to make improvements and is now up to 2.1 mph and 1.5% on treadmill.  We will continue to monitor his progress.   [name] continues to do well in rehab.  He is walking at home.  We will continue to encourage him to keep moving at home and will continue to monitor his progress at home.    Expected Outcomes  Short: Use RPE daily to regulate intensity. Long: Follow program prescription in THR.  Short: Start to increase walking time to 30 min.  Long: Continue to increase strength and stamina.   Short: Continue to increase workloads. Long: Continue to increase strength and stamina.   Short: Continue to increase workloads.  Long: Continue to increase stamina.  Short: Continue to walk at home. Long: Continue to increase strength and stamina.    [place] Name 12/29/18 0946             Exercise Goal Re-Evaluation   Exercise Goals Review  Increase Physical Activity;Increase Strength and Stamina;Understanding of Exercise Prescription       Comments  [name] is doing well at home.  He is up to 2-3 laps on his track at home, which took about 40-45 min.  He is starting to get back his strength and stamina but he wishes it would go faster.  He was added to the email list serve today.        Expected Outcomes  Short: Continue to walk track at home and try videos.  Long: Continue to build strength and stamina.           Discharge Exercise Prescription (Final Exercise Prescription Changes): Exercise Prescription Changes - 12/27/18 1600      Response to Exercise   Blood Pressure (Admit)  124/56    Blood Pressure  (Exercise)  128/70    Blood Pressure (Exit)  126/74    Heart Rate (Admit)  93 bpm    Heart Rate (Exercise)  113 bpm    Heart Rate (Exit)  98 bpm    Rating of Perceived Exertion (Exercise)  14    Symptoms  none    Duration  Continue with 45 min of aerobic exercise without signs/symptoms of physical distress.    Intensity  THRR unchanged      Progression   Progression  Continue to progress workloads to maintain intensity without signs/symptoms of physical distress.    Average METs  3.41      Resistance Training   Training Prescription  Yes    Weight  3 lbs    Reps  10-15      Interval Training   Interval Training  No      Treadmill   MPH  2.5    Grade  1.5    Minutes  15    METs  3.34      Recumbant Bike   Level  3  Minutes  15    METs  2.74      NuStep   Level  5    Minutes  15    METs  4.1      Home Exercise Plan   Plans to continue exercise at  Home (comment)   walking   Frequency  Add 2 additional days to program exercise sessions.    Initial Home Exercises Provided  11/25/18       Nutrition:  Target Goals: Understanding of nutrition guidelines, daily intake of sodium <1546m, cholesterol <2070m calories 30% from fat and 7% or less from saturated fats, daily to have 5 or more servings of fruits and vegetables.  Biometrics: Pre Biometrics - 11/10/18 1456      Pre Biometrics   Height  5' 11" (1.803 m)    Weight  209 lb (94.8 kg)    Waist Circumference  42.25 inches    Hip Circumference  38 inches    Waist to Hip Ratio  1.11 %    BMI (Calculated)  29.16    Single Leg Stand  6.5 seconds        Nutrition Therapy Plan and Nutrition Goals: Nutrition Therapy & Goals - 12/29/18 0946      Nutrition Therapy   Diet  Diabetic, low sodium, heart healthy keto diet (pt request for diabetes control) with fluid restriction <1L per MD ~1800kcal (waist 42.2inches!; ref <40inches)(BMI 29)    Protein (specify units)  75-80g    Fiber  30 grams    Whole Grain Foods   1 servings   Keto diet, avoids grains, recommend to still get at least 1 serving a day   Saturated Fats  12 max. grams   <12-16g   Fruits and Vegetables  8 servings/day    Sodium  1.5 grams      Personal Nutrition Goals   Nutrition Goal  Continue with previous goals. ST: get back to the program LT: HH, low Na, lean proteins (but will still have hamburgers, reiterated limiting staurated fats)    Comments  Discussed the importance of quality healthy fats for heart health in addition to Na which he and his wife have been following; discussed what healthy fats were vs unhealthy fats and relayed recommendations such as saturated fat <7% of kcal or <12-16g/day. BG under control while at home due to keto diet. Pt not very willing to share much information, passed phone to wife for most of the conversation. Wife reports his Na is under 2g/day and his fluid is between 1-2L. Wife reports eating low sodium, low CHO, but cholesterol has been on the high side of normal.       Intervention Plan   Intervention  Prescribe, educate and counsel regarding individualized specific dietary modifications aiming towards targeted core components such as weight, hypertension, lipid management, diabetes, heart failure and other comorbidities.    Expected Outcomes  Short Term Goal: Understand basic principles of dietary content, such as calories, fat, sodium, cholesterol and nutrients.;Long Term Goal: Adherence to prescribed nutrition plan.       Nutrition Assessments: Nutrition Assessments - 11/10/18 1520      MEDFICTS Scores   Pre Score  42       Nutrition Goals Re-Evaluation: Nutrition Goals Re-Evaluation    Row Name 11/25/18 0800             Goals   Nutrition Goal  Heart healthy, low salt, lean proteins       Comment  [name]  is following the keto diet.  His wife does most of the shopping and she does check food labels.  They try to eat as much fresh food as possible.  He watches his salt intake closely as  well his fluid intake.  They are trying to stick to leaner meats.  They eat a lot of hamburger steak.  We talked about making sure they are getting the lower fat version. He was set on following keto and eating fat despite his heart.        Expected Outcome  Short: Try for lower saturated fats and wath fluid intake.  Long: Continue to eat fresh foods.           Nutrition Goals Discharge (Final Nutrition Goals Re-Evaluation): Nutrition Goals Re-Evaluation - 11/25/18 0800      Goals   Nutrition Goal  Heart healthy, low salt, lean proteins    Comment  [name] is following the keto diet.  His wife does most of the shopping and she does check food labels.  They try to eat as much fresh food as possible.  He watches his salt intake closely as well his fluid intake.  They are trying to stick to leaner meats.  They eat a lot of hamburger steak.  We talked about making sure they are getting the lower fat version. He was set on following keto and eating fat despite his heart.     Expected Outcome  Short: Try for lower saturated fats and wath fluid intake.  Long: Continue to eat fresh foods.        Psychosocial: Target Goals: Acknowledge presence or absence of significant depression and/or stress, maximize coping skills, provide positive support system. Participant is able to verbalize types and ability to use techniques and skills needed for reducing stress and depression.   Initial Review & Psychosocial Screening: Initial Psych Review & Screening - 11/10/18 1511      Initial Review   Current issues with  None Identified      Family Dynamics   Good Support System?  Yes      Barriers   Psychosocial barriers to participate in program  The patient should benefit from training in stress management and relaxation.      Screening Interventions   Interventions  Encouraged to exercise;Program counselor consult;To provide support and resources with identified psychosocial needs;Provide feedback about the  scores to participant    Expected Outcomes  Short Term goal: Utilizing psychosocial counselor, staff and physician to assist with identification of specific Stressors or current issues interfering with healing process. Setting desired goal for each stressor or current issue identified.;Long Term Goal: Stressors or current issues are controlled or eliminated.;Short Term goal: Identification and review with participant of any Quality of Life or Depression concerns found by scoring the questionnaire.;Long Term goal: The participant improves quality of Life and PHQ9 Scores as seen by post scores and/or verbalization of changes       Quality of Life Scores:  Quality of Life - 11/10/18 1511      Quality of Life   Select  Quality of Life      Quality of Life Scores   Health/Function Pre  18.83 %    Socioeconomic Pre  28 %    Psych/Spiritual Pre  23.79 %    Family Pre  25.5 %    GLOBAL Pre  22.56 %      Scores of 19 and below usually indicate a poorer quality of life in these  areas.  A difference of  2-3 points is a clinically meaningful difference.  A difference of 2-3 points in the total score of the Quality of Life Index has been associated with significant improvement in overall quality of life, self-image, physical symptoms, and general health in studies assessing change in quality of life.  PHQ-9: Recent Review Flowsheet Data    Depression screen [facility] 2/9 11/10/2018   Decreased Interest 0   Down, Depressed, Hopeless 0   PHQ - 2 Score 0   Altered sleeping 0   Tired, decreased energy 1   Change in appetite 0   Feeling bad or failure about yourself  0   Trouble concentrating 2   Moving slowly or fidgety/restless 0   Suicidal thoughts 0   PHQ-9 Score 3   Difficult doing work/chores Not difficult at all     Interpretation of Total Score  Total Score Depression Severity:  1-4 = Minimal depression, 5-9 = Mild depression, 10-14 = Moderate depression, 15-19 = Moderately severe depression,  20-27 = Severe depression   Psychosocial Evaluation and Intervention: Psychosocial Evaluation - 11/21/18 1035      Psychosocial Evaluation & Interventions   Interventions  Encouraged to exercise with the program and follow exercise prescription    Comments  Counselor met with Mr. Perko [facility]) today for initial psychosocial evaluation. He is a 73 year old who has a history of CHG.  He has a strong support system with a spouse of 35 years and (4) adult children.  Other than his heart condition, he struggles with Diabetes.  [name] reports sleeping well with approximately 8 hours per night; and a good appetite.  He denies a history of depression or anxiety or any current symptoms and is typically in a positive mood.  He states his health is his primary stressor.  [name] has goals to walk better and improve his energy level.  He would love to have fewer dietary restrictions with salt intake.  He will be followed by staff.       Expected Outcomes  Short:  [name] will exercise consistently to improve his overall strength and energy.  Long:  [name] will develop a healthy lifestyle routine for his health and mental health.     Continue Psychosocial Services   Follow up required by staff       Psychosocial Re-Evaluation: Psychosocial Re-Evaluation    [place] Name 12/29/18 1015             Psychosocial Re-Evaluation   Current issues with  None Identified       Comments  counselor Learta Codding documenting in [place] session: counselor reached out to patient.  He has no concerns to report; sleeping and eating well.  He intends to maintain goals set at programs beginning: walk better and increase energy.  Patient expressed that he misses salt in his diet.  Says he goes across the hallway to work out.       Expected Outcomes  Short: continue exercising; long: continues to seek options for exercising outside of program structure       Continue Psychosocial Services   Follow up required by staff           Psychosocial Discharge (Final Psychosocial Re-Evaluation): Psychosocial Re-Evaluation - 12/29/18 1015      Psychosocial Re-Evaluation   Current issues with  None Identified    Comments  counselor Learta Codding documenting in [place] session: counselor reached out to patient.  He has no concerns to  report; sleeping and eating well.  He intends to maintain goals set at programs beginning: walk better and increase energy.  Patient expressed that he misses salt in his diet.  Says he goes across the hallway to work out.    Expected Outcomes  Short: continue exercising; long: continues to seek options for exercising outside of program structure    Continue Psychosocial Services   Follow up required by staff       Vocational Rehabilitation: Provide vocational rehab assistance to qualifying candidates.   Vocational Rehab Evaluation & Intervention: Vocational Rehab - 11/10/18 1525      Initial Vocational Rehab Evaluation & Intervention   Assessment shows need for Vocational Rehabilitation  No       Education: Education Goals: Education classes will be provided on a variety of topics geared toward better understanding of heart health and risk factor modification. Participant will state understanding/return demonstration of topics presented as noted by education test scores.  Learning Barriers/Preferences: Learning Barriers/Preferences - 11/10/18 1522      Learning Barriers/Preferences   Learning Barriers  None    Learning Preferences  Individual Instruction       Education Topics:  AED/CPR: - Group verbal and written instruction with the use of models to demonstrate the basic use of the AED with the basic ABC's of resuscitation.   Cardiac Rehab from 12/21/2018 in [facility] Cardiac and Pulmonary Rehab  Date  12/05/18  Educator  SB  Instruction Review Code  1- Verbalizes Understanding      General Nutrition Guidelines/Fats and Fiber: -Group instruction provided by verbal, written  material, models and posters to present the general guidelines for heart healthy nutrition. Gives an explanation and review of dietary fats and fiber.   Cardiac Rehab from 12/21/2018 in [facility] Cardiac and Pulmonary Rehab  Date  12/19/18  Educator  [facility]  Instruction Review Code  1- Verbalizes Understanding      Controlling Sodium/Reading Food Labels: -Group verbal and written material supporting the discussion of sodium use in heart healthy nutrition. Review and explanation with models, verbal and written materials for utilization of the food label.   Cardiac Rehab from 12/21/2018 in [facility] Cardiac and Pulmonary Rehab  Date  12/21/18  Educator  [facility]  Instruction Review Code  1- Verbalizes Understanding      Exercise Physiology & General Exercise Guidelines: - Group verbal and written instruction with models to review the exercise physiology of the cardiovascular system and associated critical values. Provides general exercise guidelines with specific guidelines to those with heart or lung disease.    Aerobic Exercise & Resistance Training: - Gives group verbal and written instruction on the various components of exercise. Focuses on aerobic and resistive training programs and the benefits of this training and how to safely progress through these programs..   Flexibility, Balance, Mind/Body Relaxation: Provides group verbal/written instruction on the benefits of flexibility and balance training, including mind/body exercise modes such as yoga, pilates and tai chi.  Demonstration and skill practice provided.   Stress and Anxiety: - Provides group verbal and written instruction about the health risks of elevated stress and causes of high stress.  Discuss the correlation between heart/lung disease and anxiety and treatment options. Review healthy ways to manage with stress and anxiety.   Cardiac Rehab from 12/21/2018 in [facility] Cardiac and Pulmonary Rehab  Date  11/30/18  Educator  [facility]  Instruction  Review Code  1- Verbalizes Understanding      Depression: - Provides group verbal and  written instruction on the correlation between heart/lung disease and depressed mood, treatment options, and the stigmas associated with seeking treatment.   Cardiac Rehab from 12/21/2018 in [facility] Cardiac and Pulmonary Rehab  Date  12/14/18  Educator  [facility]  Instruction Review Code  1- Verbalizes Understanding      Anatomy & Physiology of the Heart: - Group verbal and written instruction and models provide basic cardiac anatomy and physiology, with the coronary electrical and arterial systems. Review of Valvular disease and Heart Failure   Cardiac Procedures: - Group verbal and written instruction to review commonly prescribed medications for heart disease. Reviews the medication, class of the drug, and side effects. Includes the steps to properly store meds and maintain the prescription regimen. (beta blockers and nitrates)   Cardiac Rehab from 12/21/2018 in [facility] Cardiac and Pulmonary Rehab  Date  11/28/18  Educator  SB  Instruction Review Code  1- Verbalizes Understanding      Cardiac Medications I: - Group verbal and written instruction to review commonly prescribed medications for heart disease. Reviews the medication, class of the drug, and side effects. Includes the steps to properly store meds and maintain the prescription regimen.   Cardiac Rehab from 12/21/2018 in [facility] Cardiac and Pulmonary Rehab  Date  11/21/18  Educator  SB  Instruction Review Code  1- Verbalizes Understanding      Cardiac Medications II: -Group verbal and written instruction to review commonly prescribed medications for heart disease. Reviews the medication, class of the drug, and side effects. (all other drug classes)   Cardiac Rehab from 12/21/2018 in [facility] Cardiac and Pulmonary Rehab  Date  12/12/18  Educator  SB  Instruction Review Code  1- Verbalizes Understanding       Go Sex-Intimacy & Heart Disease, Get SMART  - Goal Setting: - Group verbal and written instruction through game format to discuss heart disease and the return to sexual intimacy. Provides group verbal and written material to discuss and apply goal setting through the application of the S.M.A.R.T. Method.   Cardiac Rehab from 12/21/2018 in [facility] Cardiac and Pulmonary Rehab  Date  11/28/18  Educator  SB  Instruction Review Code  1- Verbalizes Understanding      Other Matters of the Heart: - Provides group verbal, written materials and models to describe Stable Angina and Peripheral Artery. Includes description of the disease process and treatment options available to the cardiac patient.   Exercise & Equipment Safety: - Individual verbal instruction and demonstration of equipment use and safety with use of the equipment.   Cardiac Rehab from 12/21/2018 in [facility] Cardiac and Pulmonary Rehab  Date  11/10/18  Educator  [facility]  Instruction Review Code  1- Verbalizes Understanding      Infection Prevention: - Provides verbal and written material to individual with discussion of infection control including proper hand washing and proper equipment cleaning during exercise session.   Cardiac Rehab from 12/21/2018 in [facility] Cardiac and Pulmonary Rehab  Date  11/10/18  Educator  [facility]  Instruction Review Code  1- Verbalizes Understanding      Falls Prevention: - Provides verbal and written material to individual with discussion of falls prevention and safety.   Cardiac Rehab from 12/21/2018 in [facility] Cardiac and Pulmonary Rehab  Date  11/10/18  Educator  [place]  Instruction Review Code  1- Verbalizes Understanding      Diabetes: - Individual verbal and written instruction to review signs/symptoms of diabetes, desired ranges of glucose level fasting, after meals  and with exercise. Acknowledge that pre and post exercise glucose checks will be done for 3 sessions at entry of program.   Cardiac Rehab from 12/21/2018 in [facility] Cardiac and Pulmonary Rehab   Date  11/10/18  Educator  [facility]  Instruction Review Code  1- Verbalizes Understanding      Know Your Numbers and Risk Factors: -Group verbal and written instruction about important numbers in your health.  Discussion of what are risk factors and how they play a role in the disease process.  Review of Cholesterol, Blood Pressure, Diabetes, and BMI and the role they play in your overall health.   Cardiac Rehab from 12/21/2018 in [facility] Cardiac and Pulmonary Rehab  Date  12/12/18  Educator  SB  Instruction Review Code  1- Verbalizes Understanding      Sleep Hygiene: -Provides group verbal and written instruction about how sleep can affect your health.  Define sleep hygiene, discuss sleep cycles and impact of sleep habits. Review good sleep hygiene tips.    Cardiac Rehab from 12/21/2018 in [facility] Cardiac and Pulmonary Rehab  Date  11/16/18  Educator  [facility]  Instruction Review Code  1- Verbalizes Understanding      Other: -Provides group and verbal instruction on various topics (see comments)   Knowledge Questionnaire Score: Knowledge Questionnaire Score - 11/10/18 1522      Knowledge Questionnaire Score   Pre Score  20/26   Correct answers reviewed with pt, focused on depression/stress, nutrition, and tabacco      Core Components/Risk Factors/Patient Goals at Admission: Personal Goals and Risk Factors at Admission - 11/10/18 1526      Core Components/Risk Factors/Patient Goals on Admission    Weight Management  Yes;Weight Loss;Weight Maintenance    Intervention  Weight Management: Develop a combined nutrition and exercise program designed to reach desired caloric intake, while maintaining appropriate intake of nutrient and fiber, sodium and fats, and appropriate energy expenditure required for the weight goal.;Weight Management: Provide education and appropriate resources to help participant work on and attain dietary goals.;Weight Management/Obesity: Establish reasonable short term and  long term weight goals.    Admit Weight  209 lb (94.8 kg)    Goal Weight: Short Term  205 lb (93 kg)    Goal Weight: Long Term  200 lb (90.7 kg)    Expected Outcomes  Short Term: Continue to assess and modify interventions until short term weight is achieved;Long Term: Adherence to nutrition and physical activity/exercise program aimed toward attainment of established weight goal;Weight Loss: Understanding of general recommendations for a balanced deficit meal plan, which promotes 1-2 lb weight loss per week and includes a negative energy balance of [phone] kcal/d;Understanding of distribution of calorie intake throughout the day with the consumption of 4-5 meals/snacks    Tobacco Cessation  Yes    Intervention  Assist the participant in steps to quit. Provide individualized education and counseling about committing to Tobacco Cessation, relapse prevention, and pharmacological support that can be provided by physician.;Advice worker, assist with locating and accessing local/national Quit Smoking programs, and support quit date choice.    Expected Outcomes  Short Term: Will demonstrate readiness to quit, by selecting a quit date.;Short Term: Will quit all tobacco product use, adhering to prevention of relapse plan.;Long Term: Complete abstinence from all tobacco products for at least 12 months from quit date.    Diabetes  Yes    Intervention  Provide education about signs/symptoms and action to take for hypo/hyperglycemia.;Provide education about proper nutrition,  including hydration, and aerobic/resistive exercise prescription along with prescribed medications to achieve blood glucose in normal ranges: Fasting glucose 65-99 mg/dL    Expected Outcomes  Short Term: Participant verbalizes understanding of the signs/symptoms and immediate care of hyper/hypoglycemia, proper foot care and importance of medication, aerobic/resistive exercise and nutrition plan for blood glucose control.;Long  Term: Attainment of HbA1C < 7%.    Heart Failure  Yes    Intervention  Provide a combined exercise and nutrition program that is supplemented with education, support and counseling about heart failure. Directed toward relieving symptoms such as shortness of breath, decreased exercise tolerance, and extremity edema.    Expected Outcomes  Improve functional capacity of life;Short term: Attendance in program 2-3 days a week with increased exercise capacity. Reported lower sodium intake. Reported increased fruit and vegetable intake. Reports medication compliance.;Short term: Daily weights obtained and reported for increase. Utilizing diuretic protocols set by physician.;Long term: Adoption of self-care skills and reduction of barriers for early signs and symptoms recognition and intervention leading to self-care maintenance.    Hypertension  Yes    Intervention  Provide education on lifestyle modifcations including regular physical activity/exercise, weight management, moderate sodium restriction and increased consumption of fresh fruit, vegetables, and low fat dairy, alcohol moderation, and smoking cessation.;Monitor prescription use compliance.    Expected Outcomes  Short Term: Continued assessment and intervention until BP is < 140/64m HG in hypertensive participants. < 130/862mHG in hypertensive participants with diabetes, heart failure or chronic kidney disease.;Long Term: Maintenance of blood pressure at goal levels.    Lipids  Yes    Intervention  Provide education and support for participant on nutrition & aerobic/resistive exercise along with prescribed medications to achieve LDL <7047mHDL >57m27m  Expected Outcomes  Short Term: Participant states understanding of desired cholesterol values and is compliant with medications prescribed. Participant is following exercise prescription and nutrition guidelines.;Long Term: Cholesterol controlled with medications as prescribed, with individualized  exercise RX and with personalized nutrition plan. Value goals: LDL < 70mg57mL > 40 mg.       Core Components/Risk Factors/Patient Goals Review:  Goals and Risk Factor Review    Row Name 11/25/18 0753 12/29/18 0948           Core Components/Risk Factors/Patient Goals Review   Personal Goals Review  Weight Management/Obesity;Diabetes;Hypertension;Heart Failure;Lipids  Weight Management/Obesity;Diabetes;Hypertension;Heart Failure;Lipids      Review  [name] is off to a good start in rehab. Today, he noted that his heart rate was a little higher today and his blood pressure was lower than normal. He noted that his weight is up a pound as well as he weighs daily.  He does not feeling dizzy or lightheaded but does not some swelling with the extra weight on board.  We talked about using his extra lasix today to help get back down.  He normally checks his pressures daily at home and they were running steady until today.  Overally, his blood sugars have been doing better and staying steadier.  His last A1c was 6.4.  Normally, his weight is pretty steady and he is still trying to lose some more.   [name] is doing well with his weight and staying down around 202 lbs.  His blood sugars have been doing well and were around 118 this morning.  His blood pressures are doing well and he continues to check them at home; this mornimg he was 118/92 and pulse was 91.  He denies any symptoms of  his heart failure and is watching his sodium.  However, he would like to get his salt shaker back.  He is doing well with his medications.       Expected Outcomes  Short: Continue to keep eye on heart rate and blood pressures. Long: Continue to manage heart failure.   Short: Continue to work on weight loss.  Long; Continue to manage his heart failure.          Core Components/Risk Factors/Patient Goals at Discharge (Final Review):  Goals and Risk Factor Review - 12/29/18 0948      Core Components/Risk Factors/Patient Goals Review    Personal Goals Review  Weight Management/Obesity;Diabetes;Hypertension;Heart Failure;Lipids    Review  [name] is doing well with his weight and staying down around 202 lbs.  His blood sugars have been doing well and were around 118 this morning.  His blood pressures are doing well and he continues to check them at home; this mornimg he was 118/92 and pulse was 91.  He denies any symptoms of his heart failure and is watching his sodium.  However, he would like to get his salt shaker back.  He is doing well with his medications.     Expected Outcomes  Short: Continue to work on weight loss.  Long; Continue to manage his heart failure.        ITP Comments: ITP Comments    Row Name 11/10/18 1457 12/07/18 0559 12/27/18 1614 01/04/19 1149     ITP Comments  Med Review completed. Initial ITP created, diagnosis can be found in [facility] encounter 12/16.   30 day review. Continue with ITP unless directed changes by Medical Director chart review.  Our program is currently closed due to COVID-19.  We are communicating with patient via phone calls and emails.    30 day review. Continue with ITP unless directed changes by Medical Director chart review.       Comments:

## 2019-01-10 ENCOUNTER — Encounter: Payer: Self-pay | Admitting: *Deleted

## 2019-01-10 DIAGNOSIS — I5022 Chronic systolic (congestive) heart failure: Secondary | ICD-10-CM

## 2019-01-27 ENCOUNTER — Encounter: Payer: Self-pay | Admitting: *Deleted

## 2019-01-27 DIAGNOSIS — I5022 Chronic systolic (congestive) heart failure: Secondary | ICD-10-CM

## 2019-02-21 ENCOUNTER — Encounter: Payer: Self-pay | Admitting: *Deleted

## 2019-02-21 DIAGNOSIS — I5022 Chronic systolic (congestive) heart failure: Secondary | ICD-10-CM

## 2019-03-15 ENCOUNTER — Encounter: Payer: Self-pay | Admitting: *Deleted

## 2019-03-15 DIAGNOSIS — I5022 Chronic systolic (congestive) heart failure: Secondary | ICD-10-CM

## 2019-03-22 DIAGNOSIS — I5022 Chronic systolic (congestive) heart failure: Secondary | ICD-10-CM

## 2019-03-30 ENCOUNTER — Telehealth: Payer: Self-pay | Admitting: Urology

## 2019-03-31 NOTE — Telephone Encounter (Signed)
ERROR

## 2019-04-04 ENCOUNTER — Telehealth (INDEPENDENT_AMBULATORY_CARE_PROVIDER_SITE_OTHER): Payer: Medicare Other | Admitting: Urology

## 2019-04-04 ENCOUNTER — Telehealth: Payer: Self-pay | Admitting: Urology

## 2019-04-04 ENCOUNTER — Other Ambulatory Visit: Payer: Self-pay

## 2019-04-04 DIAGNOSIS — R350 Frequency of micturition: Secondary | ICD-10-CM | POA: Diagnosis not present

## 2019-04-04 DIAGNOSIS — R1030 Lower abdominal pain, unspecified: Secondary | ICD-10-CM

## 2019-04-04 DIAGNOSIS — N401 Enlarged prostate with lower urinary tract symptoms: Secondary | ICD-10-CM

## 2019-04-04 DIAGNOSIS — N138 Other obstructive and reflux uropathy: Secondary | ICD-10-CM

## 2019-04-04 NOTE — Telephone Encounter (Signed)
Would you call Mr. Costin and get him on the nurse schedule for an UA, urine culture and PVR?

## 2019-04-04 NOTE — Telephone Encounter (Signed)
Added to Shannon's schedule per Margretta Ditty

## 2019-04-04 NOTE — Progress Notes (Signed)
Virtual Visit via Telephone Note  I connected with [name] on 04/04/19 at 1030 by telephone and verified that I am speaking with the correct person using two identifiers.  They are located at home.  I am located at my home.    This visit type was conducted due to national recommendations for restrictions regarding the COVID-19 Pandemic (e.g. social distancing).  This format is felt to be most appropriate for this patient at this time.  All issues noted in this document were discussed and addressed.  No physical exam was performed.   I discussed the limitations, risks, security and privacy concerns of performing an evaluation and management service by telephone and the availability of in person appointments. I also discussed with the patient that there may be a patient responsible charge related to this service. The patient expressed understanding and agreed to proceed.   History of Present Illness: Mr. 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   He has been having frequency of urination and lower abdominal pain for a few days.  He does not feel like he is emptying his bladder.  He states his stream is weak, but he states it has always been weak.  The lower abdominal pain is achy in nature.  He has not noted anything the helps his symptoms or makes them worse.  Patient denies any gross hematuria, dysuria or suprapubic/flank pain.  Patient denies any fevers, chills, nausea or vomiting.   He would also like a PSA checked after he recovers from his UTI as he likes to keep a check on it.     Observations/Objective: Mr. Lazenby does not sound distressed and is answering questions appropriately.    Assessment and Plan:  1. Frequency of urination Possible UTI and/or urinary retention Patient will come to the office tomorrow  for an UA, urine culture and PVR  2. Lower abdominal pain See above  3. BPH with LU TS He would like a PSA checked sometime in the future - last PSA was 1.67 on 09/19/2018  Follow Up Instructions:  Mr. Dansereau will come to the office tomorrow for an UA, urine culture and PVR.  He would also like a PSA checked once his symptoms have abated and his UA is clear.     I discussed the assessment and treatment plan with the patient. The patient was provided an opportunity to ask questions and all were answered. The patient agreed with the plan and demonstrated an understanding of the instructions.   The patient was advised to call back or seek an in-person evaluation if the symptoms worsen or if the condition fails to improve as anticipated.  I provided 6 minutes of non-face-to-face time during this encounter.   Bolden Hagerman, PA-C

## 2019-04-05 ENCOUNTER — Other Ambulatory Visit: Payer: Self-pay

## 2019-04-05 ENCOUNTER — Encounter: Payer: Self-pay | Admitting: Urology

## 2019-04-05 ENCOUNTER — Ambulatory Visit (INDEPENDENT_AMBULATORY_CARE_PROVIDER_SITE_OTHER): Payer: Medicare Other | Admitting: Urology

## 2019-04-05 VITALS — BP 113/78 | HR 98 | Ht 72.0 in | Wt 206.0 lb

## 2019-04-05 DIAGNOSIS — R1030 Lower abdominal pain, unspecified: Secondary | ICD-10-CM

## 2019-04-05 DIAGNOSIS — R350 Frequency of micturition: Secondary | ICD-10-CM | POA: Diagnosis not present

## 2019-04-05 DIAGNOSIS — N138 Other obstructive and reflux uropathy: Secondary | ICD-10-CM | POA: Diagnosis not present

## 2019-04-05 DIAGNOSIS — N401 Enlarged prostate with lower urinary tract symptoms: Secondary | ICD-10-CM | POA: Diagnosis not present

## 2019-04-05 LAB — URINALYSIS, COMPLETE
Bilirubin, UA: NEGATIVE
Glucose, UA: NEGATIVE
Ketones, UA: NEGATIVE
Nitrite, UA: NEGATIVE
Protein,UA: NEGATIVE
Specific Gravity, UA: 1.025 (ref 1.005–1.030)
Urobilinogen, Ur: 0.2 mg/dL (ref 0.2–1.0)
pH, UA: 5 (ref 5.0–7.5)

## 2019-04-05 LAB — BLADDER SCAN AMB NON-IMAGING

## 2019-04-05 LAB — MICROSCOPIC EXAMINATION: Bacteria, UA: NONE SEEN

## 2019-04-05 MED ORDER — SULFAMETHOXAZOLE-TRIMETHOPRIM 800-160 MG PO TABS: 1.0000 | ORAL_TABLET | Freq: Two times a day (BID) | ORAL | 0 refills | Status: AC

## 2019-04-05 NOTE — Progress Notes (Signed)
04/05/2019 2:24 PM   Casey CorningSamuel A Peters 17-Feb-1946 [id number]  Referring provider: Gracelyn NurseJohnston, John D, MD [address] [place],  [place] [id number]  Chief Complaint  Patient presents with  . Recurrent UTI    HPI: Mr. Casey Peters presents today for an UA, urine culture and PVR.  See noted dated 04/04/2019.    PMH: Past Medical History:  Diagnosis Date  . BP (high blood pressure) 06/08/2014  . CHF (congestive heart failure) (HCC)   . Constipation   . Diabetes (HCC)   . HLD (hyperlipidemia)   . HTN (hypertension)   . Rhinitis     Surgical History: Past Surgical History:  Procedure Laterality Date  . HERNIA REPAIR  1998  . PILONIDAL CYST EXCISION    . TONSILLECTOMY    . URETHRA SURGERY  1995    Home Medications:  Allergies as of 04/05/2019   No Known Allergies     Medication List       Accurate as of April 05, 2019  2:24 PM. If you have any questions, ask your nurse or doctor.        aspirin EC 81 MG tablet Take 81 mg by mouth daily.   carvedilol 3.125 MG tablet Commonly known as: COREG Take 3.125 mg by mouth 2 (two) times daily.   furosemide 40 MG tablet Commonly known as: LASIX Take 40 mg by mouth 2 (two) times daily.   metFORMIN 1000 MG tablet Commonly known as: GLUCOPHAGE Take 500 mg by mouth daily with breakfast.   ONE TOUCH ULTRA TEST test strip Generic drug: glucose blood USE 1 STRIP 3 TIMES A DAY TO CHECK BLOOD GLUCOSE LEVELS   spironolactone 25 MG tablet Commonly known as: ALDACTONE Take 25 mg by mouth daily.   sulfamethoxazole-trimethoprim 800-160 MG tablet Commonly known as: BACTRIM DS Take 1 tablet by mouth 2 (two) times daily. What changed: Another medication with the same name was added. Make sure you understand how and when to take each. Changed by: Michiel CowboySHANNON Deeksha Cotrell, PA-C   sulfamethoxazole-trimethoprim 800-160 MG tablet Commonly known as: BACTRIM DS Take 1 tablet by mouth 2 (two) times daily for 7 days. What changed:  You were already taking a medication with the same name, and this prescription was added. Make sure you understand how and when to take each. Changed by: Michiel CowboySHANNON Ottavio Norem, PA-C   testosterone 50 MG/5GM (1%) Gel Commonly known as: ANDROGEL Apply 5 g topically daily.       Allergies: No Known Allergies  Family History: Family History  Problem Relation Age of Onset  . Hematuria Father   . Kidney disease Neg Hx   . Prostate cancer Neg Hx     Social History:  reports that he has been smoking cigars. He has never used smokeless tobacco. He reports that he does not drink alcohol or use drugs.  ROS: UROLOGY Frequent Urination?: Yes Hard to postpone urination?: Yes Burning/pain with urination?: No Get up at night to urinate?: Yes Leakage of urine?: No Urine stream starts and stops?: Yes Trouble starting stream?: No Do you have to strain to urinate?: No Blood in urine?: No Urinary tract infection?: Yes Sexually transmitted disease?: No Injury to kidneys or bladder?: No Painful intercourse?: No Weak stream?: Yes Erection problems?: Yes Penile pain?: No  Gastrointestinal Nausea?: No Vomiting?: No Indigestion/heartburn?: No Diarrhea?: No Constipation?: No  Constitutional Fever: No Night sweats?: No Weight loss?: No Fatigue?: No  Skin Skin rash/lesions?: No Itching?: No  Eyes Blurred vision?: No Double  vision?: No  Ears/Nose/Throat Sore throat?: No Sinus problems?: No  Hematologic/Lymphatic Swollen glands?: No Easy bruising?: No  Cardiovascular Leg swelling?: No Chest pain?: No  Respiratory Cough?: No Shortness of breath?: No  Endocrine Excessive thirst?: No  Musculoskeletal Back pain?: No Joint pain?: No  Neurological Headaches?: No Dizziness?: No  Psychologic Depression?: No Anxiety?: No  Physical Exam: BP 113/78 (BP Location: Left Arm, Patient Position: Sitting)   Pulse 98   Ht 6' (1.829 m)   Wt 206 lb (93.4 kg)   BMI 27.94 kg/m     Laboratory Data: No results found for: WBC, HGB, HCT, MCV, PLT  Lab Results  Component Value Date   CREATININE 1.30 (H) 01/13/2018    No results found for: PSA  No results found for: TESTOSTERONE  No results found for: HGBA1C  No results found for: TSH  No results found for: CHOL, HDL, CHOLHDL, VLDL, LDLCALC  No results found for: AST No results found for: ALT No components found for: ALKALINEPHOPHATASE No components found for: BILIRUBINTOTAL  No results found for: ESTRADIOL  Urinalysis 11-30 WBC's.  3-10 RBC's. See Epic.  I have reviewed the labs.   Pertinent Imaging: Results for SILAS, MUFF (MRN [id number]) as of 04/05/2019 14:22  Ref. Range 04/05/2019 13:18  Scan Result Unknown 61ml     Assessment & Plan:    1. Urinary frequency - Urinalysis, Complete - suspicious for infection - will send in Septra DS and will adjust if necessary if appropriate once culture and sensitivities have returned  - CULTURE, URINE COMPREHENSIVE - BLADDER SCAN AMB NON-IMAGING   2. Lower abdominal pain - see above  3. BPH with LU TS Will obtain a PSA once infection has cleared   Return for pending urine culture .  These notes generated with voice recognition software. I apologize for typographical errors.  Zara Council, PA-C  [facility] [address]  [place],  [address] [phone]

## 2019-04-08 LAB — CULTURE, URINE COMPREHENSIVE

## 2019-04-10 ENCOUNTER — Telehealth: Payer: Self-pay

## 2019-04-10 NOTE — Telephone Encounter (Signed)
-----   Message from Nori Riis, PA-C sent at 04/10/2019 12:49 PM EDT ----- Please let Casey Peters know that his urine culture was positive for an infection and that the Septra DS that was prescribed is the appropriate antibiotic.  He should be feeling better now.  If he is feeling better, I would wait another two weeks before we draw a PSA.

## 2019-04-10 NOTE — Telephone Encounter (Signed)
Called and advised patient of urine culture results and 2 week PSA. Patient verbalized understanding.

## 2019-04-13 ENCOUNTER — Encounter: Payer: Medicare Other | Attending: Cardiology | Admitting: *Deleted

## 2019-04-13 ENCOUNTER — Other Ambulatory Visit: Payer: Self-pay

## 2019-04-13 DIAGNOSIS — I5022 Chronic systolic (congestive) heart failure: Secondary | ICD-10-CM | POA: Diagnosis present

## 2019-04-13 NOTE — Progress Notes (Signed)
Daily Session Note  Patient Details  Name: Casey Peters MRN: [id number] Date of Birth: 1946-07-01 Referring Provider:     Cardiac Rehab from 11/10/2018 in [facility]  Referring Provider  Fath      Encounter Date: 04/13/2019  Check In: Session Check In - 04/13/19 0911      Check-In   Supervising physician immediately available to respond to emergencies  See telemetry face sheet for immediately available ER MD    Location  [facility]-Cardiac & Pulmonary Rehab    Staff Present  Heath Lark, RN, BSN, CCRP;Melissa Caiola RDN, LDN;Jeanna Durrell BS, Exercise Physiologist;Kambre Messner Rye, MA, RCEP, CCRP, CCET    Virtual Visit  No    Medication changes reported      No    Fall or balance concerns reported     No    Tobacco Cessation  No Change    Warm-up and Cool-down  Performed on first and last piece of equipment    Resistance Training Performed  Yes    VAD Patient?  No    PAD/SET Patient?  No      Pain Assessment   Currently in Pain?  No/denies          Social History   Tobacco Use  Smoking Status Current Every Day Smoker  . Types: Cigars  Smokeless Tobacco Never Used  Tobacco Comment   2-3 cigars per day    Goals Met:  Independence with exercise equipment Exercise tolerated well No report of cardiac concerns or symptoms Strength training completed today  Goals Unmet:  Not Applicable  Comments: Pt able to follow exercise prescription today without complaint.  Will continue to monitor for progression.    Dr. Emily Filbert is Medical Director for [facility].

## 2019-04-13 NOTE — Progress Notes (Signed)
Cardiac Individual Treatment Plan  Patient Details  Name: Casey Peters MRN: [id number] Date of Birth: [birth year] Referring Provider:     Cardiac Rehab from 11/10/2018 in [facility] Cardiac and Pulmonary Rehab  Referring Provider  Fath      Initial Encounter Date:    Cardiac Rehab from 11/10/2018 in [facility] Cardiac and Pulmonary Rehab  Date  11/10/18      Visit Diagnosis: Heart failure, chronic systolic ([place])  Patient's Home Medications on Admission:  Current Outpatient Medications:  .  aspirin EC 81 MG tablet, Take 81 mg by mouth daily., Disp: , Rfl:  .  carvedilol (COREG) 3.125 MG tablet, Take 3.125 mg by mouth 2 (two) times daily., Disp: , Rfl:  .  furosemide (LASIX) 40 MG tablet, Take 40 mg by mouth 2 (two) times daily., Disp: , Rfl:  .  metFORMIN (GLUCOPHAGE) 1000 MG tablet, Take 500 mg by mouth daily with breakfast. , Disp: , Rfl:  .  ONE TOUCH ULTRA TEST test strip, USE 1 STRIP 3 TIMES A DAY TO CHECK BLOOD GLUCOSE LEVELS, Disp: , Rfl:  .  spironolactone (ALDACTONE) 25 MG tablet, Take 25 mg by mouth daily., Disp: , Rfl:  .  sulfamethoxazole-trimethoprim (BACTRIM DS,SEPTRA DS) 800-160 MG tablet, Take 1 tablet by mouth 2 (two) times daily. (Patient not taking: Reported on 04/05/2019), Disp: 14 tablet, Rfl: 0 .  testosterone (ANDROGEL) 50 MG/5GM (1%) GEL, Apply 5 g topically daily., Disp: , Rfl:   Past Medical History: Past Medical History:  Diagnosis Date  . BP (high blood pressure) 06/08/2014  . CHF (congestive heart failure) ([place])   . Constipation   . Diabetes ([place])   . HLD (hyperlipidemia)   . HTN (hypertension)   . Rhinitis     Tobacco Use: Social History   Tobacco Use  Smoking Status Current Every Day Smoker  . Types: Cigars  Smokeless Tobacco Never Used  Tobacco Comment   2-3 cigars per day    Labs: Recent Review Flowsheet Data    There is no flowsheet data to display.       Exercise Target Goals: Exercise Program Goal: Individual exercise  prescription set using results from initial 6 min walk test and THRR while considering  patient's activity barriers and safety.   Exercise Prescription Goal: Initial exercise prescription builds to 30-45 minutes a day of aerobic activity, 2-3 days per week.  Home exercise guidelines will be given to patient during program as part of exercise prescription that the participant will acknowledge.  Activity Barriers & Risk Stratification: Activity Barriers & Cardiac Risk Stratification - 11/10/18 1506      Activity Barriers & Cardiac Risk Stratification   Activity Barriers  None    Cardiac Risk Stratification  High       6 Minute Walk: 6 Minute Walk    Row Name 11/10/18 1457         6 Minute Walk   Phase  Initial     Distance  1270 feet     Walk Time  6 minutes     # of Rest Breaks  0     MPH  2.4     METS  2.7     RPE  9     VO2 Peak  9.5     Symptoms  No     Resting HR  96 bpm     Resting BP  98/68     Resting Oxygen Saturation   96 %  Exercise Oxygen Saturation  during 6 min walk  98 %     Max Ex. HR  111 bpm     Max Ex. BP  114/68     2 Minute Post BP  124/64        Oxygen Initial Assessment:   Oxygen Re-Evaluation:   Oxygen Discharge (Final Oxygen Re-Evaluation):   Initial Exercise Prescription: Initial Exercise Prescription - 11/10/18 1500      Date of Initial Exercise RX and Referring Provider   Date  11/10/18    Referring Provider  Fath      Treadmill   MPH  2    Grade  0.5    Minutes  15    METs  2.5      Recumbant Bike   Level  3    RPM  60    Watts  22    Minutes  15    METs  2.5      NuStep   Level  2    SPM  80    Minutes  15    METs  2.5      Prescription Details   Frequency (times per week)  3    Duration  Progress to 45 minutes of aerobic exercise without signs/symptoms of physical distress      Intensity   THRR 40-80% of Max Heartrate  117-138    Ratings of Perceived Exertion  11-15    Perceived Dyspnea  0-4       Resistance Training   Training Prescription  Yes    Weight  3 lb    Reps  10-15       Perform Capillary Blood Glucose checks as needed.  Exercise Prescription Changes: Exercise Prescription Changes    Row Name 11/10/18 1500 12/01/18 0800 12/14/18 1100 12/27/18 1600       Response to Exercise   Blood Pressure (Admit)  98/68  132/74  104/62  124/56    Blood Pressure (Exercise)  114/68  124/74  142/64  128/70    Blood Pressure (Exit)  124/64  108/60  100/60  126/74    Heart Rate (Admit)  101 bpm  93 bpm  96 bpm  93 bpm    Heart Rate (Exercise)  111 bpm  115 bpm  119 bpm  113 bpm    Heart Rate (Exit)  100 bpm  100 bpm  97 bpm  98 bpm    Oxygen Saturation (Admit)  96 %  -  -  -    Oxygen Saturation (Exit)  98 %  -  -  -    Rating of Perceived Exertion (Exercise)  '9  15  15  14    ' Symptoms  -  none  none  none    Duration  -  Continue with 45 min of aerobic exercise without signs/symptoms of physical distress.  Continue with 45 min of aerobic exercise without signs/symptoms of physical distress.  Continue with 45 min of aerobic exercise without signs/symptoms of physical distress.    Intensity  -  THRR unchanged  THRR unchanged  THRR unchanged      Progression   Progression  -  Continue to progress workloads to maintain intensity without signs/symptoms of physical distress.  Continue to progress workloads to maintain intensity without signs/symptoms of physical distress.  Continue to progress workloads to maintain intensity without signs/symptoms of physical distress.    Average METs  -  2.87  3.7  3.41  Resistance Training   Training Prescription  -  Yes  Yes  Yes    Weight  -  3 lb  3 lbs  3 lbs    Reps  -  10-15  10-15  10-15      Interval Training   Interval Training  -  No  No  No      Treadmill   MPH  -  1.5  2.1  2.5    Grade  -  1  1.5  1.5    Minutes  -  '15  15  15    ' METs  -  2.36  3.04  3.34      Recumbant Bike   Level  -  '3  3  3    ' Watts  -  34  -  -     Minutes  -  '15  15  15    ' METs  -  3.16  3.16  2.74      NuStep   Level  -  '4  5  5    ' Minutes  -  '15  15  15    ' METs  -  3.1  4.9  4.1      Home Exercise Plan   Plans to continue exercise at  -  Home (comment) walking  Home (comment) walking  Home (comment) walking    Frequency  -  Add 2 additional days to program exercise sessions.  Add 2 additional days to program exercise sessions.  Add 2 additional days to program exercise sessions.    Initial Home Exercises Provided  -  11/25/18  11/25/18  11/25/18       Exercise Comments: Exercise Comments    Row Name 11/16/18 0825           Exercise Comments  First full day of exercise!  Patient was oriented to gym and equipment including functions, settings, policies, and procedures.  Patient's individual exercise prescription and treatment plan were reviewed.  All starting workloads were established based on the results of the 6 minute walk test done at initial orientation visit.  The plan for exercise progression was also introduced and progression will be customized based on patient's performance and goals.          Exercise Goals and Review: Exercise Goals    Row Name 11/10/18 1457             Exercise Goals   Increase Physical Activity  Yes       Intervention  Provide advice, education, support and counseling about physical activity/exercise needs.;Develop an individualized exercise prescription for aerobic and resistive training based on initial evaluation findings, risk stratification, comorbidities and participant's personal goals.       Expected Outcomes  Short Term: Attend rehab on a regular basis to increase amount of physical activity.;Long Term: Add in home exercise to make exercise part of routine and to increase amount of physical activity.;Long Term: Exercising regularly at least 3-5 days a week.       Increase Strength and Stamina  Yes       Intervention  Provide advice, education, support and counseling about physical  activity/exercise needs.;Develop an individualized exercise prescription for aerobic and resistive training based on initial evaluation findings, risk stratification, comorbidities and participant's personal goals.       Expected Outcomes  Short Term: Increase workloads from initial exercise prescription for resistance, speed, and METs.;Short Term: Perform resistance training exercises routinely during rehab  and add in resistance training at home;Long Term: Improve cardiorespiratory fitness, muscular endurance and strength as measured by increased METs and functional capacity (6MWT)       Able to understand and use rate of perceived exertion (RPE) scale  Yes       Intervention  Provide education and explanation on how to use RPE scale       Expected Outcomes  Short Term: Able to use RPE daily in rehab to express subjective intensity level;Long Term:  Able to use RPE to guide intensity level when exercising independently       Able to understand and use Dyspnea scale  Yes       Intervention  Provide education and explanation on how to use Dyspnea scale       Expected Outcomes  Short Term: Able to use Dyspnea scale daily in rehab to express subjective sense of shortness of breath during exertion;Long Term: Able to use Dyspnea scale to guide intensity level when exercising independently       Knowledge and understanding of Target Heart Rate Range (THRR)  Yes       Intervention  Provide education and explanation of THRR including how the numbers were predicted and where they are located for reference       Expected Outcomes  Short Term: Able to state/look up THRR;Short Term: Able to use daily as guideline for intensity in rehab;Long Term: Able to use THRR to govern intensity when exercising independently       Able to check pulse independently  Yes       Intervention  Provide education and demonstration on how to check pulse in carotid and radial arteries.;Review the importance of being able to check your  own pulse for safety during independent exercise       Expected Outcomes  Short Term: Able to explain why pulse checking is important during independent exercise;Long Term: Able to check pulse independently and accurately       Understanding of Exercise Prescription  Yes       Intervention  Provide education, explanation, and written materials on patient's individual exercise prescription       Expected Outcomes  Short Term: Able to explain program exercise prescription;Long Term: Able to explain home exercise prescription to exercise independently          Exercise Goals Re-Evaluation : Exercise Goals Re-Evaluation    Row Name 11/16/18 0825 11/25/18 0809 12/01/18 0827 12/14/18 1130 12/27/18 1614     Exercise Goal Re-Evaluation   Exercise Goals Review  Able to understand and use rate of perceived exertion (RPE) scale;Knowledge and understanding of Target Heart Rate Range (THRR);Understanding of Exercise Prescription  Increase Physical Activity;Increase Strength and Stamina;Able to understand and use rate of perceived exertion (RPE) scale;Knowledge and understanding of Target Heart Rate Range (THRR);Understanding of Exercise Prescription;Able to check pulse independently  Increase Physical Activity;Increase Strength and Stamina;Understanding of Exercise Prescription  Increase Physical Activity;Increase Strength and Stamina;Understanding of Exercise Prescription  Increase Physical Activity;Increase Strength and Stamina;Understanding of Exercise Prescription   Comments  Reviewed RPE scale, THR and program prescription with pt today.  Pt voiced understanding and was given a copy of goals to take home.   [name] is off to a good start in rehab.  He struggled his first couple of days and needed a nap after class.  This week he has been able to go and go without his nap.  Reviewed home exercise with pt today.  Pt plans to walking  at home for exercise.   He has a 0.6 mile loop that he walks.  We talked about  increasing his time to 30 min.  Reviewed THR, pulse, RPE, sign and symptoms, NTG use, and when to call 911 or MD.  Also discussed weather considerations and indoor options.  Pt voiced understanding.  [name] is doing well in rehab. He has already started to add in his home exercise and trying to increase to two laps at home without getting too tired.  He is up to 34 watts on the bike.  We will continue to monitor his progress.   [name] has been doing well in rehab.  He continues to make improvements and is now up to 2.1 mph and 1.5% on treadmill.  We will continue to monitor his progress.   [name] continues to do well in rehab.  He is walking at home.  We will continue to encourage him to keep moving at home and will continue to monitor his progress at home.    Expected Outcomes  Short: Use RPE daily to regulate intensity. Long: Follow program prescription in THR.  Short: Start to increase walking time to 30 min.  Long: Continue to increase strength and stamina.   Short: Continue to increase workloads. Long: Continue to increase strength and stamina.   Short: Continue to increase workloads.  Long: Continue to increase stamina.  Short: Continue to walk at home. Long: Continue to increase strength and stamina.    [place] Name 12/29/18 1595 01/10/19 1132 01/27/19 1124 02/21/19 1136 03/15/19 1156     Exercise Goal Re-Evaluation   Exercise Goals Review  Increase Physical Activity;Increase Strength and Stamina;Understanding of Exercise Prescription  Increase Physical Activity;Increase Strength and Stamina;Understanding of Exercise Prescription  Increase Physical Activity;Increase Strength and Stamina;Understanding of Exercise Prescription  Increase Physical Activity;Increase Strength and Stamina;Understanding of Exercise Prescription  Increase Physical Activity;Increase Strength and Stamina;Understanding of Exercise Prescription   Comments  [name] is doing well at home.  He is up to 2-3 laps on his track at home, which took about  40-45 min.  He is starting to get back his strength and stamina but he wishes it would go faster.  He was added to the email list serve today.   [name] continues to walk at home. He now up to 3 laps consistently every other day.  It is now taking him between 41-43 minutes each day.  He is getting faster each week!  He has not been able to get our email yet.  Verified his email address was right, so I encouraged them to check their junk box.    [name] has been walking at home.  Some days he is able to do a little more than his three laps.  Last weekend, he got close to 4 laps, but then felt like he over did it that day.  I encouraged him to continue to build slowly and continue to work on his stamina.   [name] continues to walk at home.  He and his wife are doing 2.5 miles every other day.  He is doing well with it, and no longer feeling exhausted afterwards.  He said that he feels that his stamina has improved.    [name] has been doing well.  The last two week, he hasn't been able to get out to walk as much due to the rain. He is hoping to get back into his routine again this week.  He continues to have some weak feelings.  He  wishes his recovery would be faster.  At his last check, his EF had improved but he still feels weak.  I encouraged him to call his cardiologist if these feelings of weakness continue.    Expected Outcomes  Short: Continue to walk track at home and try videos.  Long: Continue to build strength and stamina.   Short: Continue to walk at least three to four times a week. Long: Continue to maintain physcial activity.   Short: Continue to walk.  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   Short: Continue to walk. Long: Continue to increase stamina.   Short: Call doctor if weakness does not go away.  Long: Continue to work stamina.       Discharge Exercise Prescription (Final Exercise Prescription Changes): Exercise Prescription Changes - 12/27/18 1600      Response to Exercise   Blood Pressure  (Admit)  124/56    Blood Pressure (Exercise)  128/70    Blood Pressure (Exit)  126/74    Heart Rate (Admit)  93 bpm    Heart Rate (Exercise)  113 bpm    Heart Rate (Exit)  98 bpm    Rating of Perceived Exertion (Exercise)  14    Symptoms  none    Duration  Continue with 45 min of aerobic exercise without signs/symptoms of physical distress.    Intensity  THRR unchanged      Progression   Progression  Continue to progress workloads to maintain intensity without signs/symptoms of physical distress.    Average METs  3.41      Resistance Training   Training Prescription  Yes    Weight  3 lbs    Reps  10-15      Interval Training   Interval Training  No      Treadmill   MPH  2.5    Grade  1.5    Minutes  15    METs  3.34      Recumbant Bike   Level  3    Minutes  15    METs  2.74      NuStep   Level  5    Minutes  15    METs  4.1      Home Exercise Plan   Plans to continue exercise at  Home (comment)   walking   Frequency  Add 2 additional days to program exercise sessions.    Initial Home Exercises Provided  11/25/18       Nutrition:  Target Goals: Understanding of nutrition guidelines, daily intake of sodium <1562m, cholesterol <2041m calories 30% from fat and 7% or less from saturated fats, daily to have 5 or more servings of fruits and vegetables.  Biometrics: Pre Biometrics - 11/10/18 1456      Pre Biometrics   Height  '5\' 11"'  (1.803 m)    Weight  209 lb (94.8 kg)    Waist Circumference  42.25 inches    Hip Circumference  38 inches    Waist to Hip Ratio  1.11 %    BMI (Calculated)  29.16    Single Leg Stand  6.5 seconds        Nutrition Therapy Plan and Nutrition Goals: Nutrition Therapy & Goals - 12/29/18 0946      Nutrition Therapy   Diet  Diabetic, low sodium, heart healthy keto diet (pt request for diabetes control) with fluid restriction <1L per MD ~1800kcal (waist 42.2inches!; ref <40inches)(BMI 29)    Protein (  specify units)  75-80g     Fiber  30 grams    Whole Grain Foods  1 servings   Keto diet, avoids grains, recommend to still get at least 1 serving a day   Saturated Fats  12 max. grams   <12-16g   Fruits and Vegetables  8 servings/day    Sodium  1.5 grams      Personal Nutrition Goals   Nutrition Goal  Continue with previous goals. ST: get back to the program LT: HH, low Na, lean proteins (but will still have hamburgers, reiterated limiting staurated fats)    Comments  Discussed the importance of quality healthy fats for heart health in addition to Na which he and his wife have been following; discussed what healthy fats were vs unhealthy fats and relayed recommendations such as saturated fat <7% of kcal or <12-16g/day. BG under control while at home due to keto diet. Pt not very willing to share much information, passed phone to wife for most of the conversation. Wife reports his Na is under 2g/day and his fluid is between 1-2L. Wife reports eating low sodium, low CHO, but cholesterol has been on the high side of normal.       Intervention Plan   Intervention  Prescribe, educate and counsel regarding individualized specific dietary modifications aiming towards targeted core components such as weight, hypertension, lipid management, diabetes, heart failure and other comorbidities.    Expected Outcomes  Short Term Goal: Understand basic principles of dietary content, such as calories, fat, sodium, cholesterol and nutrients.;Long Term Goal: Adherence to prescribed nutrition plan.       Nutrition Assessments: Nutrition Assessments - 11/10/18 1520      MEDFICTS Scores   Pre Score  42       Nutrition Goals Re-Evaluation: Nutrition Goals Re-Evaluation    [place] Name 11/25/18 0800 03/22/19 1312           Goals   Current Weight  -  206 lb (93.4 kg)      Nutrition Goal  Heart healthy, low salt, lean proteins  ST:/LT: get back to the program       Comment  [name] is following the keto diet.  His wife does most of the  shopping and she does check food labels.  They try to eat as much fresh food as possible.  He watches his salt intake closely as well his fluid intake.  They are trying to stick to leaner meats.  They eat a lot of hamburger steak.  We talked about making sure they are getting the lower fat version. He was set on following keto and eating fat despite his heart.   A1c 6.5, pt mentioned he had gained some weight. Did not want to make goals or talk about nutrition, pt was frustrated that the cardiac rehab is still closed. I offered [facility] again to him to see if he was interested, pt declined. Pt expressed frustration on his progress and wants to go to the in-person rehab.       Expected Outcome  Short: Try for lower saturated fats and wath fluid intake.  Long: Continue to eat fresh foods.   Try for lower saturated fats and wath fluid intake. Continue to eat fresh foods.          Nutrition Goals Discharge (Final Nutrition Goals Re-Evaluation): Nutrition Goals Re-Evaluation - 03/22/19 1312      Goals   Current Weight  206 lb (93.4 kg)    Nutrition  Goal  ST:/LT: get back to the program     Comment  A1c 6.5, pt mentioned he had gained some weight. Did not want to make goals or talk about nutrition, pt was frustrated that the cardiac rehab is still closed. I offered [facility] again to him to see if he was interested, pt declined. Pt expressed frustration on his progress and wants to go to the in-person rehab.     Expected Outcome  Try for lower saturated fats and wath fluid intake. Continue to eat fresh foods.        Psychosocial: Target Goals: Acknowledge presence or absence of significant depression and/or stress, maximize coping skills, provide positive support system. Participant is able to verbalize types and ability to use techniques and skills needed for reducing stress and depression.   Initial Review & Psychosocial Screening: Initial Psych Review & Screening - 11/10/18 1511       Initial Review   Current issues with  None Identified      Family Dynamics   Good Support System?  Yes      Barriers   Psychosocial barriers to participate in program  The patient should benefit from training in stress management and relaxation.      Screening Interventions   Interventions  Encouraged to exercise;Program counselor consult;To provide support and resources with identified psychosocial needs;Provide feedback about the scores to participant    Expected Outcomes  Short Term goal: Utilizing psychosocial counselor, staff and physician to assist with identification of specific Stressors or current issues interfering with healing process. Setting desired goal for each stressor or current issue identified.;Long Term Goal: Stressors or current issues are controlled or eliminated.;Short Term goal: Identification and review with participant of any Quality of Life or Depression concerns found by scoring the questionnaire.;Long Term goal: The participant improves quality of Life and PHQ9 Scores as seen by post scores and/or verbalization of changes       Quality of Life Scores:  Quality of Life - 11/10/18 1511      Quality of Life   Select  Quality of Life      Quality of Life Scores   Health/Function Pre  18.83 %    Socioeconomic Pre  28 %    Psych/Spiritual Pre  23.79 %    Family Pre  25.5 %    GLOBAL Pre  22.56 %      Scores of 19 and below usually indicate a poorer quality of life in these areas.  A difference of  2-3 points is a clinically meaningful difference.  A difference of 2-3 points in the total score of the Quality of Life Index has been associated with significant improvement in overall quality of life, self-image, physical symptoms, and general health in studies assessing change in quality of life.  PHQ-9: Recent Review Flowsheet Data    Depression screen [facility] 2/9 11/10/2018   Decreased Interest 0   Down, Depressed, Hopeless 0   PHQ - 2 Score 0   Altered sleeping 0    Tired, decreased energy 1   Change in appetite 0   Feeling bad or failure about yourself  0   Trouble concentrating 2   Moving slowly or fidgety/restless 0   Suicidal thoughts 0   PHQ-9 Score 3   Difficult doing work/chores Not difficult at all     Interpretation of Total Score  Total Score Depression Severity:  1-4 = Minimal depression, 5-9 = Mild depression, 10-14 = Moderate depression, 15-19 =  Moderately severe depression, 20-27 = Severe depression   Psychosocial Evaluation and Intervention: Psychosocial Evaluation - 11/21/18 1035      Psychosocial Evaluation & Interventions   Interventions  Encouraged to exercise with the program and follow exercise prescription    Comments  Counselor met with Mr. Packman [facility]) today for initial psychosocial evaluation. He is a 73 year old who has a history of CHG.  He has a strong support system with a spouse of 75 years and (4) adult children.  Other than his heart condition, he struggles with Diabetes.  [name] reports sleeping well with approximately 8 hours per night; and a good appetite.  He denies a history of depression or anxiety or any current symptoms and is typically in a positive mood.  He states his health is his primary stressor.  [name] has goals to walk better and improve his energy level.  He would love to have fewer dietary restrictions with salt intake.  He will be followed by staff.       Expected Outcomes  Short:  [name] will exercise consistently to improve his overall strength and energy.  Long:  [name] will develop a healthy lifestyle routine for his health and mental health.     Continue Psychosocial Services   Follow up required by staff       Psychosocial Re-Evaluation: Psychosocial Re-Evaluation    Row Name 12/29/18 1015 01/27/19 1126 02/21/19 1139 03/15/19 1158       Psychosocial Re-Evaluation   Current issues with  None Identified  None Identified  Current Stress Concerns  Current Stress Concerns    Comments  counselor Learta Codding documenting in [place] session: counselor reached out to patient.  He has no concerns to report; sleeping and eating well.  He intends to maintain goals set at programs beginning: walk better and increase energy.  Patient expressed that he misses salt in his diet.  Says he goes across the hallway to work out.  [name] is doing well at home.  He is walking and continues to improve.  He has tried to push himself and had a few days where he felt like he had overdone it.  He was encouraged to continue to walk and build stamina.  He continues to sleep well and cope with being home.  No major concens  [name] is doing well.  He has been walking and feeling better.  He had an echo last week and was discouraged by that he was not further improved.  He was up to 30% now versus the 20% a year ago.  He has also started [facility] and seems to be doing well with it.  We talked about how he has improved and if he keeps working, he may continue to see improvement.   [name] is doing okay.  He is discouraged that he is still feeling week.  He did not tolerate the Entresto well and has stopped taking it.  I encouraged him to talk to his doctor if he does not start feeling better.     Expected Outcomes  Short: continue exercising; long: continues to seek options for exercising outside of program structure  Short: Continue to exercise to build stamina.  Long: Continue to stay postivie.   Short: Continue to exercise for stamina.  Long: Stay positive.   Short: Continue to exercise for stamina.  Long: Stay positive.     Interventions  -  Encouraged to attend Cardiac Rehabilitation for the exercise  Encouraged to attend Cardiac  Rehabilitation for the exercise  Encouraged to attend Cardiac Rehabilitation for the exercise    Continue Psychosocial Services   Follow up required by staff  Follow up required by staff  Follow up required by staff  Follow up required by staff       Psychosocial Discharge (Final Psychosocial  Re-Evaluation): Psychosocial Re-Evaluation - 03/15/19 1158      Psychosocial Re-Evaluation   Current issues with  Current Stress Concerns    Comments  [name] is doing okay.  He is discouraged that he is still feeling week.  He did not tolerate the Entresto well and has stopped taking it.  I encouraged him to talk to his doctor if he does not start feeling better.     Expected Outcomes  Short: Continue to exercise for stamina.  Long: Stay positive.     Interventions  Encouraged to attend Cardiac Rehabilitation for the exercise    Continue Psychosocial Services   Follow up required by staff       Vocational Rehabilitation: Provide vocational rehab assistance to qualifying candidates.   Vocational Rehab Evaluation & Intervention: Vocational Rehab - 11/10/18 1525      Initial Vocational Rehab Evaluation & Intervention   Assessment shows need for Vocational Rehabilitation  No       Education: Education Goals: Education classes will be provided on a variety of topics geared toward better understanding of heart health and risk factor modification. Participant will state understanding/return demonstration of topics presented as noted by education test scores.  Learning Barriers/Preferences: Learning Barriers/Preferences - 11/10/18 1522      Learning Barriers/Preferences   Learning Barriers  None    Learning Preferences  Individual Instruction       Education Topics:  AED/CPR: - Group verbal and written instruction with the use of models to demonstrate the basic use of the AED with the basic ABC's of resuscitation.   Cardiac Rehab from 12/21/2018 in [facility] Cardiac and Pulmonary Rehab  Date  12/05/18  Educator  SB  Instruction Review Code  1- Verbalizes Understanding      General Nutrition Guidelines/Fats and Fiber: -Group instruction provided by verbal, written material, models and posters to present the general guidelines for heart healthy nutrition. Gives an explanation and review  of dietary fats and fiber.   Cardiac Rehab from 12/21/2018 in [facility] Cardiac and Pulmonary Rehab  Date  12/19/18  Educator  [facility]  Instruction Review Code  1- Verbalizes Understanding      Controlling Sodium/Reading Food Labels: -Group verbal and written material supporting the discussion of sodium use in heart healthy nutrition. Review and explanation with models, verbal and written materials for utilization of the food label.   Cardiac Rehab from 12/21/2018 in [facility] Cardiac and Pulmonary Rehab  Date  12/21/18  Educator  [facility]  Instruction Review Code  1- Verbalizes Understanding      Exercise Physiology & General Exercise Guidelines: - Group verbal and written instruction with models to review the exercise physiology of the cardiovascular system and associated critical values. Provides general exercise guidelines with specific guidelines to those with heart or lung disease.    Aerobic Exercise & Resistance Training: - Gives group verbal and written instruction on the various components of exercise. Focuses on aerobic and resistive training programs and the benefits of this training and how to safely progress through these programs..   Flexibility, Balance, Mind/Body Relaxation: Provides group verbal/written instruction on the benefits of flexibility and balance training, including mind/body exercise modes such as  yoga, pilates and tai chi.  Demonstration and skill practice provided.   Stress and Anxiety: - Provides group verbal and written instruction about the health risks of elevated stress and causes of high stress.  Discuss the correlation between heart/lung disease and anxiety and treatment options. Review healthy ways to manage with stress and anxiety.   Cardiac Rehab from 12/21/2018 in [facility] Cardiac and Pulmonary Rehab  Date  11/30/18  Educator  [facility]  Instruction Review Code  1- Verbalizes Understanding      Depression: - Provides group verbal and written instruction on the  correlation between heart/lung disease and depressed mood, treatment options, and the stigmas associated with seeking treatment.   Cardiac Rehab from 12/21/2018 in [facility] Cardiac and Pulmonary Rehab  Date  12/14/18  Educator  KG  Instruction Review Code  1- Verbalizes Understanding      Anatomy & Physiology of the Heart: - Group verbal and written instruction and models provide basic cardiac anatomy and physiology, with the coronary electrical and arterial systems. Review of Valvular disease and Heart Failure   Cardiac Procedures: - Group verbal and written instruction to review commonly prescribed medications for heart disease. Reviews the medication, class of the drug, and side effects. Includes the steps to properly store meds and maintain the prescription regimen. (beta blockers and nitrates)   Cardiac Rehab from 12/21/2018 in [facility] Cardiac and Pulmonary Rehab  Date  11/28/18  Educator  SB  Instruction Review Code  1- Verbalizes Understanding      Cardiac Medications I: - Group verbal and written instruction to review commonly prescribed medications for heart disease. Reviews the medication, class of the drug, and side effects. Includes the steps to properly store meds and maintain the prescription regimen.   Cardiac Rehab from 12/21/2018 in [facility] Cardiac and Pulmonary Rehab  Date  11/21/18  Educator  SB  Instruction Review Code  1- Verbalizes Understanding      Cardiac Medications II: -Group verbal and written instruction to review commonly prescribed medications for heart disease. Reviews the medication, class of the drug, and side effects. (all other drug classes)   Cardiac Rehab from 12/21/2018 in [facility] Cardiac and Pulmonary Rehab  Date  12/12/18  Educator  SB  Instruction Review Code  1- Verbalizes Understanding       Go Sex-Intimacy & Heart Disease, Get SMART - Goal Setting: - Group verbal and written instruction through game format to discuss heart disease and the return to  sexual intimacy. Provides group verbal and written material to discuss and apply goal setting through the application of the S.M.A.R.T. Method.   Cardiac Rehab from 12/21/2018 in [facility] Cardiac and Pulmonary Rehab  Date  11/28/18  Educator  SB  Instruction Review Code  1- Verbalizes Understanding      Other Matters of the Heart: - Provides group verbal, written materials and models to describe Stable Angina and Peripheral Artery. Includes description of the disease process and treatment options available to the cardiac patient.   Exercise & Equipment Safety: - Individual verbal instruction and demonstration of equipment use and safety with use of the equipment.   Cardiac Rehab from 12/21/2018 in [facility] Cardiac and Pulmonary Rehab  Date  11/10/18  Educator  [facility]  Instruction Review Code  1- Verbalizes Understanding      Infection Prevention: - Provides verbal and written material to individual with discussion of infection control including proper hand washing and proper equipment cleaning during exercise session.   Cardiac Rehab from 12/21/2018 in  [place] Cardiac and Pulmonary Rehab  Date  11/10/18  Educator  [place]  Instruction Review Code  1- Verbalizes Understanding      Falls Prevention: - Provides verbal and written material to individual with discussion of falls prevention and safety.   Cardiac Rehab from 12/21/2018 in [facility] Cardiac and Pulmonary Rehab  Date  11/10/18  Educator  [place]  Instruction Review Code  1- Verbalizes Understanding      Diabetes: - Individual verbal and written instruction to review signs/symptoms of diabetes, desired ranges of glucose level fasting, after meals and with exercise. Acknowledge that pre and post exercise glucose checks will be done for 3 sessions at entry of program.   Cardiac Rehab from 12/21/2018 in [facility] Cardiac and Pulmonary Rehab  Date  11/10/18  Educator  [facility]  Instruction Review Code  1- Verbalizes Understanding      Know Your Numbers and Risk  Factors: -Group verbal and written instruction about important numbers in your health.  Discussion of what are risk factors and how they play a role in the disease process.  Review of Cholesterol, Blood Pressure, Diabetes, and BMI and the role they play in your overall health.   Cardiac Rehab from 12/21/2018 in [facility] Cardiac and Pulmonary Rehab  Date  12/12/18  Educator  SB  Instruction Review Code  1- Verbalizes Understanding      Sleep Hygiene: -Provides group verbal and written instruction about how sleep can affect your health.  Define sleep hygiene, discuss sleep cycles and impact of sleep habits. Review good sleep hygiene tips.    Cardiac Rehab from 12/21/2018 in [facility] Cardiac and Pulmonary Rehab  Date  11/16/18  Educator  [facility]  Instruction Review Code  1- Verbalizes Understanding      Other: -Provides group and verbal instruction on various topics (see comments)   Knowledge Questionnaire Score: Knowledge Questionnaire Score - 11/10/18 1522      Knowledge Questionnaire Score   Pre Score  20/26   Correct answers reviewed with pt, focused on depression/stress, nutrition, and tabacco      Core Components/Risk Factors/Patient Goals at Admission: Personal Goals and Risk Factors at Admission - 11/10/18 1526      Core Components/Risk Factors/Patient Goals on Admission    Weight Management  Yes;Weight Loss;Weight Maintenance    Intervention  Weight Management: Develop a combined nutrition and exercise program designed to reach desired caloric intake, while maintaining appropriate intake of nutrient and fiber, sodium and fats, and appropriate energy expenditure required for the weight goal.;Weight Management: Provide education and appropriate resources to help participant work on and attain dietary goals.;Weight Management/Obesity: Establish reasonable short term and long term weight goals.    Admit Weight  209 lb (94.8 kg)    Goal Weight: Short Term  205 lb (93 kg)    Goal Weight: Long  Term  200 lb (90.7 kg)    Expected Outcomes  Short Term: Continue to assess and modify interventions until short term weight is achieved;Long Term: Adherence to nutrition and physical activity/exercise program aimed toward attainment of established weight goal;Weight Loss: Understanding of general recommendations for a balanced deficit meal plan, which promotes 1-2 lb weight loss per week and includes a negative energy balance of [phone] kcal/d;Understanding of distribution of calorie intake throughout the day with the consumption of 4-5 meals/snacks    Tobacco Cessation  Yes    Intervention  Assist the participant in steps to quit. Provide individualized education and counseling about committing to Tobacco Cessation, relapse prevention,  and pharmacological support that can be provided by physician.;Advice worker, assist with locating and accessing local/national Quit Smoking programs, and support quit date choice.    Expected Outcomes  Short Term: Will demonstrate readiness to quit, by selecting a quit date.;Short Term: Will quit all tobacco product use, adhering to prevention of relapse plan.;Long Term: Complete abstinence from all tobacco products for at least 12 months from quit date.    Diabetes  Yes    Intervention  Provide education about signs/symptoms and action to take for hypo/hyperglycemia.;Provide education about proper nutrition, including hydration, and aerobic/resistive exercise prescription along with prescribed medications to achieve blood glucose in normal ranges: Fasting glucose 65-99 mg/dL    Expected Outcomes  Short Term: Participant verbalizes understanding of the signs/symptoms and immediate care of hyper/hypoglycemia, proper foot care and importance of medication, aerobic/resistive exercise and nutrition plan for blood glucose control.;Long Term: Attainment of HbA1C < 7%.    Heart Failure  Yes    Intervention  Provide a combined exercise and nutrition program that  is supplemented with education, support and counseling about heart failure. Directed toward relieving symptoms such as shortness of breath, decreased exercise tolerance, and extremity edema.    Expected Outcomes  Improve functional capacity of life;Short term: Attendance in program 2-3 days a week with increased exercise capacity. Reported lower sodium intake. Reported increased fruit and vegetable intake. Reports medication compliance.;Short term: Daily weights obtained and reported for increase. Utilizing diuretic protocols set by physician.;Long term: Adoption of self-care skills and reduction of barriers for early signs and symptoms recognition and intervention leading to self-care maintenance.    Hypertension  Yes    Intervention  Provide education on lifestyle modifcations including regular physical activity/exercise, weight management, moderate sodium restriction and increased consumption of fresh fruit, vegetables, and low fat dairy, alcohol moderation, and smoking cessation.;Monitor prescription use compliance.    Expected Outcomes  Short Term: Continued assessment and intervention until BP is < 140/63m HG in hypertensive participants. < 130/892mHG in hypertensive participants with diabetes, heart failure or chronic kidney disease.;Long Term: Maintenance of blood pressure at goal levels.    Lipids  Yes    Intervention  Provide education and support for participant on nutrition & aerobic/resistive exercise along with prescribed medications to achieve LDL <7015mHDL >42m34m  Expected Outcomes  Short Term: Participant states understanding of desired cholesterol values and is compliant with medications prescribed. Participant is following exercise prescription and nutrition guidelines.;Long Term: Cholesterol controlled with medications as prescribed, with individualized exercise RX and with personalized nutrition plan. Value goals: LDL < 70mg29mL > 40 mg.       Core Components/Risk  Factors/Patient Goals Review:  Goals and Risk Factor Review    Row Name 11/25/18 0753 12/29/18 0948 01/10/19 1140 01/27/19 1129 02/21/19 1142     Core Components/Risk Factors/Patient Goals Review   Personal Goals Review  Weight Management/Obesity;Diabetes;Hypertension;Heart Failure;Lipids  Weight Management/Obesity;Diabetes;Hypertension;Heart Failure;Lipids  Weight Management/Obesity;Hypertension;Heart Failure;Diabetes  Weight Management/Obesity;Hypertension;Heart Failure;Diabetes  Weight Management/Obesity;Hypertension;Heart Failure;Diabetes   Review  [name] is off to a good start in rehab. Today, he noted that his heart rate was a little higher today and his blood pressure was lower than normal. He noted that his weight is up a pound as well as he weighs daily.  He does not feeling dizzy or lightheaded but does not some swelling with the extra weight on board.  We talked about using his extra lasix today to help get back down.  He normally checks  his pressures daily at home and they were running steady until today.  Overally, his blood sugars have been doing better and staying steadier.  His last A1c was 6.4.  Normally, his weight is pretty steady and he is still trying to lose some more.   [name] is doing well with his weight and staying down around 202 lbs.  His blood sugars have been doing well and were around 118 this morning.  His blood pressures are doing well and he continues to check them at home; this mornimg he was 118/92 and pulse was 91.  He denies any symptoms of his heart failure and is watching his sodium.  However, he would like to get his salt shaker back.  He is doing well with his medications.   [name] continues to keep an eye on his weight and blood pressures.  He is doing his best to stay on top of his heart failure.  He noted that his heart rate is starting to come back down again as well.  He is eating better and has found this to help all of his numbers as well.  [name] has been doing well at  home.  His weight has been steady and he checks it daily.  He is also doing well with his blood pressures.  He did not note any heart failure symptoms.    [name] is doing well.  His weight is up about 5 lbs since being home.  He has started [facility] now for his heart failure and his EF is now up to 30%!  His pressures have been doing well and he feels pretty good overall!!.    Expected Outcomes  Short: Continue to keep eye on heart rate and blood pressures. Long: Continue to manage heart failure.   Short: Continue to work on weight loss.  Long; Continue to manage his heart failure.   Short: Continue to work on weight loss through diet and exercise.  Long; Continue to manage his heart failure.   Short: Continue to monitor weight.  Long: Continue to manage heart failure symptoms.   Short: Continue to monitor symptoms closely since starting Entresto.  Long: Continue to work on weight loss.    [place] Name 03/15/19 1159             Core Components/Risk Factors/Patient Goals Review   Personal Goals Review  Weight Management/Obesity;Hypertension;Heart Failure;Diabetes       Review  [name] is doing okay.  Weight continues to be stable.  He did not tolerate the Entresto well as it was dropping his blood pressure; he stopped taking it.  He has continued to have feelings of general weakness.  I encouraged him to contact his doctor if he does not start feeling better.        Expected Outcomes  Short: Call doctor if not improved.  Long: Continue to work on weight loss.           Core Components/Risk Factors/Patient Goals at Discharge (Final Review):  Goals and Risk Factor Review - 03/15/19 1159      Core Components/Risk Factors/Patient Goals Review   Personal Goals Review  Weight Management/Obesity;Hypertension;Heart Failure;Diabetes    Review  [name] is doing okay.  Weight continues to be stable.  He did not tolerate the Entresto well as it was dropping his blood pressure; he stopped taking it.  He has continued to have  feelings of general weakness.  I encouraged him to contact his doctor if he does not start feeling  better.     Expected Outcomes  Short: Call doctor if not improved.  Long: Continue to work on weight loss.        ITP Comments: ITP Comments    Row Name 11/10/18 1457 12/07/18 0559 12/27/18 1614 01/04/19 1149     ITP Comments  Med Review completed. Initial ITP created, diagnosis can be found in [facility] encounter 12/16.   30 day review. Continue with ITP unless directed changes by Medical Director chart review.  Our program is currently closed due to COVID-19.  We are communicating with patient via phone calls and emails.    30 day review. Continue with ITP unless directed changes by Medical Director chart review.       Comments: Return to rehab

## 2019-04-17 ENCOUNTER — Encounter: Payer: Medicare Other | Admitting: *Deleted

## 2019-04-17 ENCOUNTER — Other Ambulatory Visit: Payer: Self-pay

## 2019-04-17 DIAGNOSIS — I5022 Chronic systolic (congestive) heart failure: Secondary | ICD-10-CM

## 2019-04-17 NOTE — Progress Notes (Signed)
Daily Session Note  Patient Details  Name: Casey Peters MRN: [id number] Date of Birth: 20-Jul-1946 Referring Provider:     Cardiac Rehab from 11/10/2018 in [facility] Cardiac and Pulmonary Rehab  Referring Provider  F[place]      Encounter Date: 04/17/2019  Check In: Session Check In - 04/17/19 0907      Check-In   Supervising physician immediately available to respond to emergencies  See telemetry face sheet for immediately available ER MD    Location  [facility]-Cardiac & Pulmonary Rehab    Staff Present  Earlean Shawl, BS, ACSM CEP, Exercise Physiologist;Susanne Bice, RN, BSN, CCRP;Tonnia Bardin [place], MA, RCEP, CCRP, CCET;[employer] Visit  No    Medication changes reported      Yes    Comments  Changed Crestor to 10 mg daily    Warm-up and Cool-down  Performed on first and last piece of equipment    Resistance Training Performed  Yes    VAD Patient?  No    PAD/SET Patient?  No      Pain Assessment   Currently in Pain?  No/denies          Social History   Tobacco Use  Smoking Status Current Every Day Smoker  . Types: Cigars  Smokeless Tobacco Never Used  Tobacco Comment   2-3 cigars per day    Goals Met:  Independence with exercise equipment Exercise tolerated well Personal goals reviewed No report of cardiac concerns or symptoms Strength training completed today  Goals Unmet:  Not Applicable  Comments: Pt able to follow exercise prescription today without complaint.  Will continue to monitor for progression.    Dr. Emily Filbert is Medical Director for [facility] and [facility].

## 2019-04-19 ENCOUNTER — Other Ambulatory Visit: Payer: Self-pay

## 2019-04-19 ENCOUNTER — Other Ambulatory Visit: Payer: Self-pay | Admitting: Urology

## 2019-04-19 ENCOUNTER — Encounter: Payer: Medicare Other | Admitting: *Deleted

## 2019-04-19 ENCOUNTER — Other Ambulatory Visit: Payer: Medicare Other

## 2019-04-19 DIAGNOSIS — I5022 Chronic systolic (congestive) heart failure: Secondary | ICD-10-CM

## 2019-04-19 DIAGNOSIS — N401 Enlarged prostate with lower urinary tract symptoms: Secondary | ICD-10-CM

## 2019-04-19 DIAGNOSIS — N138 Other obstructive and reflux uropathy: Secondary | ICD-10-CM

## 2019-04-19 NOTE — Progress Notes (Signed)
Daily Session Note  Patient Details  Name: Casey Peters MRN: [id number] Date of Birth: [birth year] Referring Provider:     Cardiac Rehab from 11/10/2018 in [facility] Cardiac and Pulmonary Rehab  Referring Provider  Fath      Encounter Date: 04/19/2019  Check In: Session Check In - 04/19/19 1129      Check-In   Supervising physician immediately available to respond to emergencies  See telemetry face sheet for immediately available ER MD    Location  [facility]-Cardiac & Pulmonary Rehab    Staff Present  Heath Lark, RN, BSN, CCRP;Joseph Hood RCP,RRT,BSRT;Melissa [place] RDN, LDN;Jeanna Durrell BS, Exercise Physiologist    Virtual Visit  No    Medication changes reported      Yes    Fall or balance concerns reported     No    Tobacco Cessation  No Change    Warm-up and Cool-down  Performed on first and last piece of equipment    Resistance Training Performed  Yes    VAD Patient?  No    PAD/SET Patient?  No      Pain Assessment   Currently in Pain?  No/denies          Social History   Tobacco Use  Smoking Status Current Every Day Smoker  . Types: Cigars  Smokeless Tobacco Never Used  Tobacco Comment   2-3 cigars per day    Goals Met:  Independence with exercise equipment Exercise tolerated well No report of cardiac concerns or symptoms Strength training completed today  Goals Unmet:  Not Applicable  Comments: Pt able to follow exercise prescription today without complaint.  Will continue to monitor for progression.    Dr. Emily Filbert is Medical Director for [facility] and [facility].

## 2019-04-20 ENCOUNTER — Telehealth: Payer: Self-pay

## 2019-04-20 DIAGNOSIS — I5022 Chronic systolic (congestive) heart failure: Secondary | ICD-10-CM | POA: Diagnosis not present

## 2019-04-20 LAB — PSA: Prostate Specific Ag, Serum: 1.6 ng/mL (ref 0.0–4.0)

## 2019-04-20 NOTE — Progress Notes (Signed)
Daily Session Note  Patient Details  Name: Casey Peters MRN: [id number] Date of Birth: Jun 08, 1946 Referring Provider:     Cardiac Rehab from 11/10/2018 in [facility] Cardiac and Pulmonary Rehab  Referring Provider  Fath      Encounter Date: 04/20/2019  Check In: Session Check In - 04/20/19 0853      Check-In   Supervising physician immediately available to respond to emergencies  See telemetry face sheet for immediately available ER MD    Location  [facility]-Cardiac & Pulmonary Rehab    Staff Present  Jasper Loser BS, Exercise Physiologist;Susanne Bice, RN, BSN, CCRP;Amanpreet [place]mont RCP,RRT,BSRT    Virtual Visit  No    Medication changes reported      No    Fall or balance concerns reported     No    Warm-up and Cool-down  Performed on first and last piece of equipment    Resistance Training Performed  Yes    VAD Patient?  No    PAD/SET Patient?  No      Pain Assessment   Currently in Pain?  No/denies    Multiple Pain Sites  No          Social History   Tobacco Use  Smoking Status Current Every Day Smoker  . Types: Cigars  Smokeless Tobacco Never Used  Tobacco Comment   2-3 cigars per day    Goals Met:  Independence with exercise equipment Exercise tolerated well No report of cardiac concerns or symptoms Strength training completed today  Goals Unmet:  Not Applicable  Comments: Pt able to follow exercise prescription today without complaint.  Will continue to monitor for progression.    Dr. Emily Filbert is Medical Director for [facility] and [facility].

## 2019-04-20 NOTE — Telephone Encounter (Signed)
-----   Message from Nori Riis, PA-C sent at 04/20/2019  7:38 AM EDT ----- Please let Mr. Severe know that his PSA is 1.6.

## 2019-04-20 NOTE — Telephone Encounter (Signed)
Left message for patient to return call for results.

## 2019-04-24 ENCOUNTER — Other Ambulatory Visit: Payer: Self-pay

## 2019-04-24 ENCOUNTER — Encounter: Payer: Medicare Other | Admitting: *Deleted

## 2019-04-24 VITALS — Ht 71.0 in | Wt 211.0 lb

## 2019-04-24 DIAGNOSIS — I5022 Chronic systolic (congestive) heart failure: Secondary | ICD-10-CM | POA: Diagnosis not present

## 2019-04-24 NOTE — Progress Notes (Signed)
Daily Session Note  Patient Details  Name: Casey Peters MRN: [id number] Date of Birth: 08/21/1946 Referring Provider:     Cardiac Rehab from 11/10/2018 in [facility] Cardiac and Pulmonary Rehab  Referring Provider  Fath      Encounter Date: 04/24/2019  Check In: Session Check In - 04/24/19 1056      Check-In   Supervising physician immediately available to respond to emergencies  See telemetry face sheet for immediately available ER MD    Location  [facility]-Cardiac & Pulmonary Rehab    Staff Present  Kelly Hayes, BS, ACSM CEP, Exercise Physiologist;Susanne Bice, RN, BSN, CCRP;Joseph Hood RCP,RRT,BSRT    Virtual Visit  No    Medication changes reported      No    Fall or balance concerns reported     No    Warm-up and Cool-down  Performed on first and last piece of equipment    Resistance Training Performed  Yes    VAD Patient?  No    PAD/SET Patient?  No      Pain Assessment   Currently in Pain?  No/denies    Multiple Pain Sites  No          Social History   Tobacco Use  Smoking Status Current Every Day Smoker  . Types: Cigars  Smokeless Tobacco Never Used  Tobacco Comment   2-3 cigars per day    Goals Met:  Independence with exercise equipment Exercise tolerated well No report of cardiac concerns or symptoms Strength training completed today  Goals Unmet:  Not Applicable  Comments: Pt able to follow exercise prescription today without complaint.  Will continue to monitor for progression. 6 Minute Walk    Row Name 11/10/18 1457 04/24/19 0926       6 Minute Walk   Phase  Initial  Discharge    Distance  1270 feet  1450 feet    Distance % Change  -  14.17 %    Distance Feet Change  -  1270 ft    Walk Time  6 minutes  6 minutes    # of Rest Breaks  0  0    MPH  2.4  2.74    METS  2.7  2.72    RPE  9  9    VO2 Peak  9.5  9.54    Symptoms  No  No    Resting HR  96 bpm  86 bpm    Resting BP  98/68  104/62    Resting Oxygen Saturation   96 %  -    Exercise Oxygen Saturation  during 6 min walk  98 %  -    Max Ex. HR  111 bpm  99 bpm    Max Ex. BP  114/68  98/62    2 Minute Post BP  124/64  -         Dr. Mark Miller is Medical Director for [facility] and [facility]. 

## 2019-04-25 NOTE — Telephone Encounter (Signed)
Patient notified and voiced understanding.

## 2019-04-26 ENCOUNTER — Encounter: Payer: Self-pay | Admitting: *Deleted

## 2019-04-26 ENCOUNTER — Encounter: Payer: Medicare Other | Admitting: *Deleted

## 2019-04-26 ENCOUNTER — Other Ambulatory Visit: Payer: Self-pay

## 2019-04-26 DIAGNOSIS — I5022 Chronic systolic (congestive) heart failure: Secondary | ICD-10-CM

## 2019-04-26 NOTE — Progress Notes (Signed)
Cardiac Individual Treatment Plan  Patient Details  Name: Casey Peters MRN: [id number] Date of Birth: 10-May-1946 Referring Provider:     Cardiac Rehab from 11/10/2018 in [facility]  Referring Provider  Fath      Initial Encounter Date:    Cardiac Rehab from 11/10/2018 in [facility]  Date  11/10/18      Visit Diagnosis: Heart failure, chronic systolic ([place])  Patient's Home Medications on Admission:  Current Outpatient Medications:  .  aspirin EC 81 MG tablet, Take 81 mg by mouth daily., Disp: , Rfl:  .  carvedilol (COREG) 3.125 MG tablet, Take 3.125 mg by mouth 2 (two) times daily., Disp: , Rfl:  .  furosemide (LASIX) 40 MG tablet, Take 40 mg by mouth 2 (two) times daily., Disp: , Rfl:  .  metFORMIN (GLUCOPHAGE) 1000 MG tablet, Take 500 mg by mouth daily with breakfast. , Disp: , Rfl:  .  ONE TOUCH ULTRA TEST test strip, USE 1 STRIP 3 TIMES A DAY TO CHECK BLOOD GLUCOSE LEVELS, Disp: , Rfl:  .  spironolactone (ALDACTONE) 25 MG tablet, Take 25 mg by mouth daily., Disp: , Rfl:  .  sulfamethoxazole-trimethoprim (BACTRIM DS,SEPTRA DS) 800-160 MG tablet, Take 1 tablet by mouth 2 (two) times daily. (Patient not taking: Reported on 04/05/2019), Disp: 14 tablet, Rfl: 0 .  testosterone (ANDROGEL) 50 MG/5GM (1%) GEL, Apply 5 g topically daily., Disp: , Rfl:   Past Medical History: Past Medical History:  Diagnosis Date  . BP (high blood pressure) 06/08/2014  . CHF (congestive heart failure) ([place])   . Constipation   . Diabetes ([place])   . HLD (hyperlipidemia)   . HTN (hypertension)   . Rhinitis     Tobacco Use: Social History   Tobacco Use  Smoking Status Current Every Day Smoker  . Types: Cigars  Smokeless Tobacco Never Used  Tobacco Comment   2-3 cigars per day    Labs: Recent Review Flowsheet Data    There is no flowsheet data to display.       Exercise Target Goals: Exercise Program Goal: Individual exercise  prescription set using results from initial 6 min walk test and THRR while considering  patient's activity barriers and safety.   Exercise Prescription Goal: Initial exercise prescription builds to 30-45 minutes a day of aerobic activity, 2-3 days per week.  Home exercise guidelines will be given to patient during program as part of exercise prescription that the participant will acknowledge.  Activity Barriers & Risk Stratification: Activity Barriers & Cardiac Risk Stratification - 11/10/18 1506      Activity Barriers & Cardiac Risk Stratification   Activity Barriers  None    Cardiac Risk Stratification  High       6 Minute Walk: 6 Minute Walk    Row Name 11/10/18 1457 04/24/19 0926       6 Minute Walk   Phase  Initial  Discharge    Distance  1270 feet  1450 feet    Distance % Change  -  14.17 %    Distance Feet Change  -  1270 ft    Walk Time  6 minutes  6 minutes    # of Rest Breaks  0  0    MPH  2.4  2.74    METS  2.7  2.72    RPE  9  9    VO2 Peak  9.5  9.54    Symptoms  No  No    Resting HR  96 bpm  86 bpm    Resting BP  98/68  104/62    Resting Oxygen Saturation   96 %  -    Exercise Oxygen Saturation  during 6 min walk  98 %  -    Max Ex. HR  111 bpm  99 bpm    Max Ex. BP  114/68  98/62    2 Minute Post BP  124/64  -       Oxygen Initial Assessment:   Oxygen Re-Evaluation:   Oxygen Discharge (Final Oxygen Re-Evaluation):   Initial Exercise Prescription: Initial Exercise Prescription - 11/10/18 1500      Date of Initial Exercise RX and Referring Provider   Date  11/10/18    Referring Provider  Fath      Treadmill   MPH  2    Grade  0.5    Minutes  15    METs  2.5      Recumbant Bike   Level  3    RPM  60    Watts  22    Minutes  15    METs  2.5      NuStep   Level  2    SPM  80    Minutes  15    METs  2.5      Prescription Details   Frequency (times per week)  3    Duration  Progress to 45 minutes of aerobic exercise without  signs/symptoms of physical distress      Intensity   THRR 40-80% of Max Heartrate  117-138    Ratings of Perceived Exertion  11-15    Perceived Dyspnea  0-4      Resistance Training   Training Prescription  Yes    Weight  3 lb    Reps  10-15       Perform Capillary Blood Glucose checks as needed.  Exercise Prescription Changes: Exercise Prescription Changes    Row Name 11/10/18 1500 12/01/18 0800 12/14/18 1100 12/27/18 1600       Response to Exercise   Blood Pressure (Admit)  98/68  132/74  104/62  124/56    Blood Pressure (Exercise)  114/68  124/74  142/64  128/70    Blood Pressure (Exit)  124/64  108/60  100/60  126/74    Heart Rate (Admit)  101 bpm  93 bpm  96 bpm  93 bpm    Heart Rate (Exercise)  111 bpm  115 bpm  119 bpm  113 bpm    Heart Rate (Exit)  100 bpm  100 bpm  97 bpm  98 bpm    Oxygen Saturation (Admit)  96 %  -  -  -    Oxygen Saturation (Exit)  98 %  -  -  -    Rating of Perceived Exertion (Exercise)  _0 Symptoms  -  none  none  none    Duration  -  Continue with 45 min of aerobic exercise without signs/symptoms of physical distress.  Continue with 45 min of aerobic exercise without signs/symptoms of physical distress.  Continue with 45 min of aerobic exercise without signs/symptoms of physical distress.    Intensity  -  THRR unchanged  THRR unchanged  THRR unchanged      Progression   Progression  -  Continue to progress workloads to maintain intensity without signs/symptoms of physical  distress.  Continue to progress workloads to maintain intensity without signs/symptoms of physical distress.  Continue to progress workloads to maintain intensity without signs/symptoms of physical distress.    Average METs  -  2.87  3.7  3.41      Resistance Training   Training Prescription  -  Yes  Yes  Yes    Weight  -  3 lb  3 lbs  3 lbs    Reps  -  10-15  10-15  10-15      Interval Training   Interval Training  -  No  No  No      Treadmill   MPH  -   1.5  2.1  2.5    Grade  -  1  1.5  1.5    Minutes  -  _0 METs  -  2.36  3.04  3.34      Recumbant Bike   Level  -  _1 Watts  -  34  -  -    Minutes  -  _2 METs  -  3.16  3.16  2.74      NuStep   Level  -  _3 Minutes  -  _4 METs  -  3.1  4.9  4.1      Home Exercise Plan   Plans to continue exercise at  -  Home (comment) walking  Home (comment) walking  Home (comment) walking    Frequency  -  Add 2 additional days to program exercise sessions.  Add 2 additional days to program exercise sessions.  Add 2 additional days to program exercise sessions.    Initial Home Exercises Provided  -  11/25/18  11/25/18  11/25/18       Exercise Comments: Exercise Comments    Row Name 11/16/18 0825           Exercise Comments  First full day of exercise!  Patient was oriented to gym and equipment including functions, settings, policies, and procedures.  Patient's individual exercise prescription and treatment plan were reviewed.  All starting workloads were established based on the results of the 6 minute walk test done at initial orientation visit.  The plan for exercise progression was also introduced and progression will be customized based on patient's performance and goals.          Exercise Goals and Review: Exercise Goals    Row Name 11/10/18 1457             Exercise Goals   Increase Physical Activity  Yes       Intervention  Provide advice, education, support and counseling about physical activity/exercise needs.;Develop an individualized exercise prescription for aerobic and resistive training based on initial evaluation findings, risk stratification, comorbidities and participant's personal goals.       Expected Outcomes  Short Term: Attend rehab on a regular basis to increase amount of physical activity.;Long Term: Add in home exercise to make exercise part of routine and to increase amount of physical activity.;Long Term:  Exercising regularly at least 3-5 days a week.       Increase Strength and Stamina  Yes       Intervention  Provide advice, education, support and counseling about physical activity/exercise needs.;Develop an individualized exercise prescription for aerobic  and resistive training based on initial evaluation findings, risk stratification, comorbidities and participant's personal goals.       Expected Outcomes  Short Term: Increase workloads from initial exercise prescription for resistance, speed, and METs.;Short Term: Perform resistance training exercises routinely during rehab and add in resistance training at home;Long Term: Improve cardiorespiratory fitness, muscular endurance and strength as measured by increased METs and functional capacity (6MWT)       Able to understand and use rate of perceived exertion (RPE) scale  Yes       Intervention  Provide education and explanation on how to use RPE scale       Expected Outcomes  Short Term: Able to use RPE daily in rehab to express subjective intensity level;Long Term:  Able to use RPE to guide intensity level when exercising independently       Able to understand and use Dyspnea scale  Yes       Intervention  Provide education and explanation on how to use Dyspnea scale       Expected Outcomes  Short Term: Able to use Dyspnea scale daily in rehab to express subjective sense of shortness of breath during exertion;Long Term: Able to use Dyspnea scale to guide intensity level when exercising independently       Knowledge and understanding of Target Heart Rate Range (THRR)  Yes       Intervention  Provide education and explanation of THRR including how the numbers were predicted and where they are located for reference       Expected Outcomes  Short Term: Able to state/look up THRR;Short Term: Able to use daily as guideline for intensity in rehab;Long Term: Able to use THRR to govern intensity when exercising independently       Able to check pulse  independently  Yes       Intervention  Provide education and demonstration on how to check pulse in carotid and radial arteries.;Review the importance of being able to check your own pulse for safety during independent exercise       Expected Outcomes  Short Term: Able to explain why pulse checking is important during independent exercise;Long Term: Able to check pulse independently and accurately       Understanding of Exercise Prescription  Yes       Intervention  Provide education, explanation, and written materials on patient's individual exercise prescription       Expected Outcomes  Short Term: Able to explain program exercise prescription;Long Term: Able to explain home exercise prescription to exercise independently          Exercise Goals Re-Evaluation : Exercise Goals Re-Evaluation    Row Name 11/16/18 0825 11/25/18 0809 12/01/18 0827 12/14/18 1130 12/27/18 1614     Exercise Goal Re-Evaluation   Exercise Goals Review  Able to understand and use rate of perceived exertion (RPE) scale;Knowledge and understanding of Target Heart Rate Range (THRR);Understanding of Exercise Prescription  Increase Physical Activity;Increase Strength and Stamina;Able to understand and use rate of perceived exertion (RPE) scale;Knowledge and understanding of Target Heart Rate Range (THRR);Understanding of Exercise Prescription;Able to check pulse independently  Increase Physical Activity;Increase Strength and Stamina;Understanding of Exercise Prescription  Increase Physical Activity;Increase Strength and Stamina;Understanding of Exercise Prescription  Increase Physical Activity;Increase Strength and Stamina;Understanding of Exercise Prescription   Comments  Reviewed RPE scale, THR and program prescription with pt today.  Pt voiced understanding and was given a copy of goals to take home.   [name] is off to  a good start in rehab.  He struggled his first couple of days and needed a nap after class.  This week he has been  able to go and go without his nap.  Reviewed home exercise with pt today.  Pt plans to walking at home for exercise.   He has a 0.6 mile loop that he walks.  We talked about increasing his time to 30 min.  Reviewed THR, pulse, RPE, sign and symptoms, NTG use, and when to call 911 or MD.  Also discussed weather considerations and indoor options.  Pt voiced understanding.  [name] is doing well in rehab. He has already started to add in his home exercise and trying to increase to two laps at home without getting too tired.  He is up to 34 watts on the bike.  We will continue to monitor his progress.   [name] has been doing well in rehab.  He continues to make improvements and is now up to 2.1 mph and 1.5% on treadmill.  We will continue to monitor his progress.   [name] continues to do well in rehab.  He is walking at home.  We will continue to encourage him to keep moving at home and will continue to monitor his progress at home.    Expected Outcomes  Short: Use RPE daily to regulate intensity. Long: Follow program prescription in THR.  Short: Start to increase walking time to 30 min.  Long: Continue to increase strength and stamina.   Short: Continue to increase workloads. Long: Continue to increase strength and stamina.   Short: Continue to increase workloads.  Long: Continue to increase stamina.  Short: Continue to walk at home. Long: Continue to increase strength and stamina.    [place] Name 12/29/18 5320 01/10/19 1132 01/27/19 1124 02/21/19 1136 03/15/19 1156     Exercise Goal Re-Evaluation   Exercise Goals Review  Increase Physical Activity;Increase Strength and Stamina;Understanding of Exercise Prescription  Increase Physical Activity;Increase Strength and Stamina;Understanding of Exercise Prescription  Increase Physical Activity;Increase Strength and Stamina;Understanding of Exercise Prescription  Increase Physical Activity;Increase Strength and Stamina;Understanding of Exercise Prescription  Increase Physical  Activity;Increase Strength and Stamina;Understanding of Exercise Prescription   Comments  [name] is doing well at home.  He is up to 2-3 laps on his track at home, which took about 40-45 min.  He is starting to get back his strength and stamina but he wishes it would go faster.  He was added to the email list serve today.   [name] continues to walk at home. He now up to 3 laps consistently every other day.  It is now taking him between 41-43 minutes each day.  He is getting faster each week!  He has not been able to get our email yet.  Verified his email address was right, so I encouraged them to check their junk box.    [name] has been walking at home.  Some days he is able to do a little more than his three laps.  Last weekend, he got close to 4 laps, but then felt like he over did it that day.  I encouraged him to continue to build slowly and continue to work on his stamina.   [name] continues to walk at home.  He and his wife are doing 2.5 miles every other day.  He is doing well with it, and no longer feeling exhausted afterwards.  He said that he feels that his stamina has improved.    [name] has  been doing well.  The last two week, he hasn't been able to get out to walk as much due to the rain. He is hoping to get back into his routine again this week.  He continues to have some weak feelings.  He wishes his recovery would be faster.  At his last check, his EF had improved but he still feels weak.  I encouraged him to call his cardiologist if these feelings of weakness continue.    Expected Outcomes  Short: Continue to walk track at home and try videos.  Long: Continue to build strength and stamina.   Short: Continue to walk at least three to four times a week. Long: Continue to maintain physcial activity.   Short: Continue to walk.  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   Short: Continue to walk. Long: Continue to increase stamina.   Short: Call doctor if weakness does not go away.  Long: Continue to work  stamina.    [place] Name 04/17/19 0915             Exercise Goal Re-Evaluation   Exercise Goals Review  Increase Physical Activity;Increase Strength and Stamina;Understanding of Exercise Prescription       Comments  [name] is happy to be back in rehab.  He will be doing his post 6MWT soon and we expect him to improve.  He had a spell while out and had to lay down in shop.  He went to see his doctor and they could not find anything.  He is starting to feel better now.  He is planning to continue to walk after graduation.       Expected Outcomes  Short: Improve post 6MWT.  Long: Continue to walk independently.          Discharge Exercise Prescription (Final Exercise Prescription Changes): Exercise Prescription Changes - 12/27/18 1600      Response to Exercise   Blood Pressure (Admit)  124/56    Blood Pressure (Exercise)  128/70    Blood Pressure (Exit)  126/74    Heart Rate (Admit)  93 bpm    Heart Rate (Exercise)  113 bpm    Heart Rate (Exit)  98 bpm    Rating of Perceived Exertion (Exercise)  14    Symptoms  none    Duration  Continue with 45 min of aerobic exercise without signs/symptoms of physical distress.    Intensity  THRR unchanged      Progression   Progression  Continue to progress workloads to maintain intensity without signs/symptoms of physical distress.    Average METs  3.41      Resistance Training   Training Prescription  Yes    Weight  3 lbs    Reps  10-15      Interval Training   Interval Training  No      Treadmill   MPH  2.5    Grade  1.5    Minutes  15    METs  3.34      Recumbant Bike   Level  3    Minutes  15    METs  2.74      NuStep   Level  5    Minutes  15    METs  4.1      Home Exercise Plan   Plans to continue exercise at  Home (comment)   walking   Frequency  Add 2 additional days to program exercise sessions.    Initial  Home Exercises Provided  11/25/18       Nutrition:  Target Goals: Understanding of nutrition guidelines, daily  intake of sodium '1500mg'$ , cholesterol '200mg'$ , calories 30% from fat and 7% or less from saturated fats, daily to have 5 or more servings of fruits and vegetables.  Biometrics: Pre Biometrics - 04/24/19 0949      Pre Biometrics   Height  '5\' 11"'$  (1.803 m)    Weight  211 lb (95.7 kg)    BMI (Calculated)  29.44    Single Leg Stand  4.47 seconds        Nutrition Therapy Plan and Nutrition Goals: Nutrition Therapy & Goals - 12/29/18 0946      Nutrition Therapy   Diet  Diabetic, low sodium, heart healthy keto diet (pt request for diabetes control) with fluid restriction <1L per MD ~1800kcal (waist 42.2inches!; ref <40inches)(BMI 29)    Protein (specify units)  75-80g    Fiber  30 grams    Whole Grain Foods  1 servings   Keto diet, avoids grains, recommend to still get at least 1 serving a day   Saturated Fats  12 max. grams   <12-16g   Fruits and Vegetables  8 servings/day    Sodium  1.5 grams      Personal Nutrition Goals   Nutrition Goal  Continue with previous goals. ST: get back to the program LT: HH, low Na, lean proteins (but will still have hamburgers, reiterated limiting staurated fats)    Comments  Discussed the importance of quality healthy fats for heart health in addition to Na which he and his wife have been following; discussed what healthy fats were vs unhealthy fats and relayed recommendations such as saturated fat <7% of kcal or <12-16g/day. BG under control while at home due to keto diet. Pt not very willing to share much information, passed phone to wife for most of the conversation. Wife reports his Na is under 2g/day and his fluid is between 1-2L. Wife reports eating low sodium, low CHO, but cholesterol has been on the high side of normal.       Intervention Plan   Intervention  Prescribe, educate and counsel regarding individualized specific dietary modifications aiming towards targeted core components such as weight, hypertension, lipid management, diabetes, heart  failure and other comorbidities.    Expected Outcomes  Short Term Goal: Understand basic principles of dietary content, such as calories, fat, sodium, cholesterol and nutrients.;Long Term Goal: Adherence to prescribed nutrition plan.       Nutrition Assessments: Nutrition Assessments - 11/10/18 1520      MEDFICTS Scores   Pre Score  42       Nutrition Goals Re-Evaluation: Nutrition Goals Re-Evaluation    [place] Name 11/25/18 0800 03/22/19 1312 04/17/19 1028         Goals   Current Weight  -  206 lb (93.4 kg)  -     Nutrition Goal  Heart healthy, low salt, lean proteins  ST:/LT: get back to the program   -     Comment  [name] is following the keto diet.  His wife does most of the shopping and she does check food labels.  They try to eat as much fresh food as possible.  He watches his salt intake closely as well his fluid intake.  They are trying to stick to leaner meats.  They eat a lot of hamburger steak.  We talked about making sure they are getting the lower fat  version. He was set on following keto and eating fat despite his heart.   A1c 6.5, pt mentioned he had gained some weight. Did not want to make goals or talk about nutrition, pt was frustrated that the cardiac rehab is still closed. I offered [facility] again to him to see if he was interested, pt declined. Pt expressed frustration on his progress and wants to go to the in-person rehab.   Pt deferred nutrition (does not want to make any changes)     Expected Outcome  Short: Try for lower saturated fats and wath fluid intake.  Long: Continue to eat fresh foods.   Try for lower saturated fats and wath fluid intake. Continue to eat fresh foods.   -        Nutrition Goals Discharge (Final Nutrition Goals Re-Evaluation): Nutrition Goals Re-Evaluation - 04/17/19 1028      Goals   Comment  Pt deferred nutrition (does not want to make any changes)       Psychosocial: Target Goals: Acknowledge presence or absence of significant  depression and/or stress, maximize coping skills, provide positive support system. Participant is able to verbalize types and ability to use techniques and skills needed for reducing stress and depression.   Initial Review & Psychosocial Screening: Initial Psych Review & Screening - 11/10/18 1511      Initial Review   Current issues with  None Identified      Family Dynamics   Good Support System?  Yes      Barriers   Psychosocial barriers to participate in program  The patient should benefit from training in stress management and relaxation.      Screening Interventions   Interventions  Encouraged to exercise;Program counselor consult;To provide support and resources with identified psychosocial needs;Provide feedback about the scores to participant    Expected Outcomes  Short Term goal: Utilizing psychosocial counselor, staff and physician to assist with identification of specific Stressors or current issues interfering with healing process. Setting desired goal for each stressor or current issue identified.;Long Term Goal: Stressors or current issues are controlled or eliminated.;Short Term goal: Identification and review with participant of any Quality of Life or Depression concerns found by scoring the questionnaire.;Long Term goal: The participant improves quality of Life and PHQ9 Scores as seen by post scores and/or verbalization of changes       Quality of Life Scores:  Quality of Life - 04/13/19 1123      Quality of Life Scores   Health/Function Pre  18.83 %    Health/Function Post  21.73 %    Health/Function % Change  15.4 %    Socioeconomic Pre  28 %    Socioeconomic Post  25.33 %    Socioeconomic % Change   -9.54 %    Psych/Spiritual Pre  23.79 %    Psych/Spiritual Post  28.57 %    Psych/Spiritual % Change  20.09 %    Family Pre  25.5 %    Family Post  22.8 %    Family % Change  -10.59 %    GLOBAL Pre  22.56 %    GLOBAL Post  24 %    GLOBAL % Change  6.38 %       Scores of 19 and below usually indicate a poorer quality of life in these areas.  A difference of  2-3 points is a clinically meaningful difference.  A difference of 2-3 points in the total score of the Quality of  Life Index has been associated with significant improvement in overall quality of life, self-image, physical symptoms, and general health in studies assessing change in quality of life.  PHQ-9: Recent Review Flowsheet Data    Depression screen [facility] 2/9 11/10/2018   Decreased Interest 0   Down, Depressed, Hopeless 0   PHQ - 2 Score 0   Altered sleeping 0   Tired, decreased energy 1   Change in appetite 0   Feeling bad or failure about yourself  0   Trouble concentrating 2   Moving slowly or fidgety/restless 0   Suicidal thoughts 0   PHQ-9 Score 3   Difficult doing work/chores Not difficult at all     Interpretation of Total Score  Total Score Depression Severity:  1-4 = Minimal depression, 5-9 = Mild depression, 10-14 = Moderate depression, 15-19 = Moderately severe depression, 20-27 = Severe depression   Psychosocial Evaluation and Intervention: Psychosocial Evaluation - 11/21/18 1035      Psychosocial Evaluation & Interventions   Interventions  Encouraged to exercise with the program and follow exercise prescription    Comments  Counselor met with Mr. Hartje [facility]) today for initial psychosocial evaluation. He is a 73 year old who has a history of CHG.  He has a strong support system with a spouse of 39 years and (4) adult children.  Other than his heart condition, he struggles with Diabetes.  [name] reports sleeping well with approximately 8 hours per night; and a good appetite.  He denies a history of depression or anxiety or any current symptoms and is typically in a positive mood.  He states his health is his primary stressor.  [name] has goals to walk better and improve his energy level.  He would love to have fewer dietary restrictions with salt intake.  He will be  followed by staff.       Expected Outcomes  Short:  [name] will exercise consistently to improve his overall strength and energy.  Long:  [name] will develop a healthy lifestyle routine for his health and mental health.     Continue Psychosocial Services   Follow up required by staff       Psychosocial Re-Evaluation: Psychosocial Re-Evaluation    Row Name 12/29/18 1015 01/27/19 1126 02/21/19 1139 03/15/19 1158 04/17/19 0917     Psychosocial Re-Evaluation   Current issues with  None Identified  None Identified  Current Stress Concerns  Current Stress Concerns  Current Stress Concerns   Comments  counselor Learta Codding documenting in [place] session: counselor reached out to patient.  He has no concerns to report; sleeping and eating well.  He intends to maintain goals set at programs beginning: walk better and increase energy.  Patient expressed that he misses salt in his diet.  Says he goes across the hallway to work out.  [name] is doing well at home.  He is walking and continues to improve.  He has tried to push himself and had a few days where he felt like he had overdone it.  He was encouraged to continue to walk and build stamina.  He continues to sleep well and cope with being home.  No major concens  [name] is doing well.  He has been walking and feeling better.  He had an echo last week and was discouraged by that he was not further improved.  He was up to 30% now versus the 20% a year ago.  He has also started [facility] and seems to be  doing well with it.  We talked about how he has improved and if he keeps working, he may continue to see improvement.   [name] is doing okay.  He is discouraged that he is still feeling week.  He did not tolerate the Entresto well and has stopped taking it.  I encouraged him to talk to his doctor if he does not start feeling better.   [name] is starting to feel better now after his spell a few months ago.  He is glad to be back in class and will be graduating soon.  Sleep is staying  the same. Overall, he is doing well.He would like to get back to going on crusing again but knows that it will be next year.   Expected Outcomes  Short: continue exercising; long: continues to seek options for exercising outside of program structure  Short: Continue to exercise to build stamina.  Long: Continue to stay postivie.   Short: Continue to exercise for stamina.  Long: Stay positive.   Short: Continue to exercise for stamina.  Long: Stay positive.   Short: Continue to exercise for stamina.  Long: Stay positive.    Interventions  -  Encouraged to attend Cardiac Rehabilitation for the exercise  Encouraged to attend Cardiac Rehabilitation for the exercise  Encouraged to attend Cardiac Rehabilitation for the exercise  -   Continue Psychosocial Services   Follow up required by staff  Follow up required by staff  Follow up required by staff  Follow up required by staff  -      Psychosocial Discharge (Final Psychosocial Re-Evaluation): Psychosocial Re-Evaluation - 04/17/19 0917      Psychosocial Re-Evaluation   Current issues with  Current Stress Concerns    Comments  [name] is starting to feel better now after his spell a few months ago.  He is glad to be back in class and will be graduating soon.  Sleep is staying the same. Overall, he is doing well.He would like to get back to going on crusing again but knows that it will be next year.    Expected Outcomes  Short: Continue to exercise for stamina.  Long: Stay positive.        Vocational Rehabilitation: Provide vocational rehab assistance to qualifying candidates.   Vocational Rehab Evaluation & Intervention: Vocational Rehab - 11/10/18 1525      Initial Vocational Rehab Evaluation & Intervention   Assessment shows need for Vocational Rehabilitation  No       Education: Education Goals: Education classes will be provided on a variety of topics geared toward better understanding of heart health and risk factor modification. Participant  will state understanding/return demonstration of topics presented as noted by education test scores.  Learning Barriers/Preferences: Learning Barriers/Preferences - 11/10/18 1522      Learning Barriers/Preferences   Learning Barriers  None    Learning Preferences  Individual Instruction       Education Topics:  AED/CPR: - Group verbal and written instruction with the use of models to demonstrate the basic use of the AED with the basic ABC's of resuscitation.   Cardiac Rehab from 12/21/2018 in [facility] Cardiac and Pulmonary Rehab  Date  12/05/18  Educator  SB  Instruction Review Code  1- Verbalizes Understanding      General Nutrition Guidelines/Fats and Fiber: -Group instruction provided by verbal, written material, models and posters to present the general guidelines for heart healthy nutrition. Gives an explanation and review of dietary fats and fiber.  Cardiac Rehab from 12/21/2018 in [facility] Cardiac and Pulmonary Rehab  Date  12/19/18  Educator  [facility]  Instruction Review Code  1- Verbalizes Understanding      Controlling Sodium/Reading Food Labels: -Group verbal and written material supporting the discussion of sodium use in heart healthy nutrition. Review and explanation with models, verbal and written materials for utilization of the food label.   Cardiac Rehab from 12/21/2018 in [facility] Cardiac and Pulmonary Rehab  Date  12/21/18  Educator  [facility]  Instruction Review Code  1- Verbalizes Understanding      Exercise Physiology & General Exercise Guidelines: - Group verbal and written instruction with models to review the exercise physiology of the cardiovascular system and associated critical values. Provides general exercise guidelines with specific guidelines to those with heart or lung disease.    Aerobic Exercise & Resistance Training: - Gives group verbal and written instruction on the various components of exercise. Focuses on aerobic and resistive training programs and the  benefits of this training and how to safely progress through these programs..   Flexibility, Balance, Mind/Body Relaxation: Provides group verbal/written instruction on the benefits of flexibility and balance training, including mind/body exercise modes such as yoga, pilates and tai chi.  Demonstration and skill practice provided.   Stress and Anxiety: - Provides group verbal and written instruction about the health risks of elevated stress and causes of high stress.  Discuss the correlation between heart/lung disease and anxiety and treatment options. Review healthy ways to manage with stress and anxiety.   Cardiac Rehab from 12/21/2018 in [facility] Cardiac and Pulmonary Rehab  Date  11/30/18  Educator  [facility]  Instruction Review Code  1- Verbalizes Understanding      Depression: - Provides group verbal and written instruction on the correlation between heart/lung disease and depressed mood, treatment options, and the stigmas associated with seeking treatment.   Cardiac Rehab from 12/21/2018 in [facility] Cardiac and Pulmonary Rehab  Date  12/14/18  Educator  KG  Instruction Review Code  1- Verbalizes Understanding      Anatomy & Physiology of the Heart: - Group verbal and written instruction and models provide basic cardiac anatomy and physiology, with the coronary electrical and arterial systems. Review of Valvular disease and Heart Failure   Cardiac Procedures: - Group verbal and written instruction to review commonly prescribed medications for heart disease. Reviews the medication, class of the drug, and side effects. Includes the steps to properly store meds and maintain the prescription regimen. (beta blockers and nitrates)   Cardiac Rehab from 12/21/2018 in [facility] Cardiac and Pulmonary Rehab  Date  11/28/18  Educator  SB  Instruction Review Code  1- Verbalizes Understanding      Cardiac Medications I: - Group verbal and written instruction to review commonly prescribed medications for  heart disease. Reviews the medication, class of the drug, and side effects. Includes the steps to properly store meds and maintain the prescription regimen.   Cardiac Rehab from 12/21/2018 in [facility] Cardiac and Pulmonary Rehab  Date  11/21/18  Educator  SB  Instruction Review Code  1- Verbalizes Understanding      Cardiac Medications II: -Group verbal and written instruction to review commonly prescribed medications for heart disease. Reviews the medication, class of the drug, and side effects. (all other drug classes)   Cardiac Rehab from 12/21/2018 in [facility] Cardiac and Pulmonary Rehab  Date  12/12/18  Educator  SB  Instruction Review Code  1- Verbalizes Understanding  Go Sex-Intimacy & Heart Disease, Get SMART - Goal Setting: - Group verbal and written instruction through game format to discuss heart disease and the return to sexual intimacy. Provides group verbal and written material to discuss and apply goal setting through the application of the S.M.A.R.T. Method.   Cardiac Rehab from 12/21/2018 in [facility] Cardiac and Pulmonary Rehab  Date  11/28/18  Educator  SB  Instruction Review Code  1- Verbalizes Understanding      Other Matters of the Heart: - Provides group verbal, written materials and models to describe Stable Angina and Peripheral Artery. Includes description of the disease process and treatment options available to the cardiac patient.   Exercise & Equipment Safety: - Individual verbal instruction and demonstration of equipment use and safety with use of the equipment.   Cardiac Rehab from 12/21/2018 in [facility] Cardiac and Pulmonary Rehab  Date  11/10/18  Educator  [facility]  Instruction Review Code  1- Verbalizes Understanding      Infection Prevention: - Provides verbal and written material to individual with discussion of infection control including proper hand washing and proper equipment cleaning during exercise session.   Cardiac Rehab from 12/21/2018 in [facility] Cardiac  and Pulmonary Rehab  Date  11/10/18  Educator  [facility]  Instruction Review Code  1- Verbalizes Understanding      Falls Prevention: - Provides verbal and written material to individual with discussion of falls prevention and safety.   Cardiac Rehab from 12/21/2018 in [facility] Cardiac and Pulmonary Rehab  Date  11/10/18  Educator  [place]  Instruction Review Code  1- Verbalizes Understanding      Diabetes: - Individual verbal and written instruction to review signs/symptoms of diabetes, desired ranges of glucose level fasting, after meals and with exercise. Acknowledge that pre and post exercise glucose checks will be done for 3 sessions at entry of program.   Cardiac Rehab from 12/21/2018 in [facility] Cardiac and Pulmonary Rehab  Date  11/10/18  Educator  [facility]  Instruction Review Code  1- Verbalizes Understanding      Know Your Numbers and Risk Factors: -Group verbal and written instruction about important numbers in your health.  Discussion of what are risk factors and how they play a role in the disease process.  Review of Cholesterol, Blood Pressure, Diabetes, and BMI and the role they play in your overall health.   Cardiac Rehab from 12/21/2018 in [facility] Cardiac and Pulmonary Rehab  Date  12/12/18  Educator  SB  Instruction Review Code  1- Verbalizes Understanding      Sleep Hygiene: -Provides group verbal and written instruction about how sleep can affect your health.  Define sleep hygiene, discuss sleep cycles and impact of sleep habits. Review good sleep hygiene tips.    Cardiac Rehab from 12/21/2018 in [facility] Cardiac and Pulmonary Rehab  Date  11/16/18  Educator  [facility]  Instruction Review Code  1- Verbalizes Understanding      Other: -Provides group and verbal instruction on various topics (see comments)   Knowledge Questionnaire Score: Knowledge Questionnaire Score - 04/13/19 1123      Knowledge Questionnaire Score   Pre Score  20/26    Post Score  25/26       Core Components/Risk  Factors/Patient Goals at Admission: Personal Goals and Risk Factors at Admission - 11/10/18 1526      Core Components/Risk Factors/Patient Goals on Admission    Weight Management  Yes;Weight Loss;Weight Maintenance    Intervention  Weight Management: Develop a  combined nutrition and exercise program designed to reach desired caloric intake, while maintaining appropriate intake of nutrient and fiber, sodium and fats, and appropriate energy expenditure required for the weight goal.;Weight Management: Provide education and appropriate resources to help participant work on and attain dietary goals.;Weight Management/Obesity: Establish reasonable short term and long term weight goals.    Admit Weight  209 lb (94.8 kg)    Goal Weight: Short Term  205 lb (93 kg)    Goal Weight: Long Term  200 lb (90.7 kg)    Expected Outcomes  Short Term: Continue to assess and modify interventions until short term weight is achieved;Long Term: Adherence to nutrition and physical activity/exercise program aimed toward attainment of established weight goal;Weight Loss: Understanding of general recommendations for a balanced deficit meal plan, which promotes 1-2 lb weight loss per week and includes a negative energy balance of [phone] kcal/d;Understanding of distribution of calorie intake throughout the day with the consumption of 4-5 meals/snacks    Tobacco Cessation  Yes    Intervention  Assist the participant in steps to quit. Provide individualized education and counseling about committing to Tobacco Cessation, relapse prevention, and pharmacological support that can be provided by physician.;Advice worker, assist with locating and accessing local/national Quit Smoking programs, and support quit date choice.    Expected Outcomes  Short Term: Will demonstrate readiness to quit, by selecting a quit date.;Short Term: Will quit all tobacco product use, adhering to prevention of relapse plan.;Long Term: Complete  abstinence from all tobacco products for at least 12 months from quit date.    Diabetes  Yes    Intervention  Provide education about signs/symptoms and action to take for hypo/hyperglycemia.;Provide education about proper nutrition, including hydration, and aerobic/resistive exercise prescription along with prescribed medications to achieve blood glucose in normal ranges: Fasting glucose 65-99 mg/dL    Expected Outcomes  Short Term: Participant verbalizes understanding of the signs/symptoms and immediate care of hyper/hypoglycemia, proper foot care and importance of medication, aerobic/resistive exercise and nutrition plan for blood glucose control.;Long Term: Attainment of HbA1C < 7%.    Heart Failure  Yes    Intervention  Provide a combined exercise and nutrition program that is supplemented with education, support and counseling about heart failure. Directed toward relieving symptoms such as shortness of breath, decreased exercise tolerance, and extremity edema.    Expected Outcomes  Improve functional capacity of life;Short term: Attendance in program 2-3 days a week with increased exercise capacity. Reported lower sodium intake. Reported increased fruit and vegetable intake. Reports medication compliance.;Short term: Daily weights obtained and reported for increase. Utilizing diuretic protocols set by physician.;Long term: Adoption of self-care skills and reduction of barriers for early signs and symptoms recognition and intervention leading to self-care maintenance.    Hypertension  Yes    Intervention  Provide education on lifestyle modifcations including regular physical activity/exercise, weight management, moderate sodium restriction and increased consumption of fresh fruit, vegetables, and low fat dairy, alcohol moderation, and smoking cessation.;Monitor prescription use compliance.    Expected Outcomes  Short Term: Continued assessment and intervention until BP is < 140/22m HG in hypertensive  participants. < 130/841mHG in hypertensive participants with diabetes, heart failure or chronic kidney disease.;Long Term: Maintenance of blood pressure at goal levels.    Lipids  Yes    Intervention  Provide education and support for participant on nutrition & aerobic/resistive exercise along with prescribed medications to achieve LDL '70mg'$ , HDL >'40mg'$ .    Expected Outcomes  Short  Term: Participant states understanding of desired cholesterol values and is compliant with medications prescribed. Participant is following exercise prescription and nutrition guidelines.;Long Term: Cholesterol controlled with medications as prescribed, with individualized exercise RX and with personalized nutrition plan. Value goals: LDL < 88m, HDL > 40 mg.       Core Components/Risk Factors/Patient Goals Review:  Goals and Risk Factor Review    Row Name 11/25/18 0753 12/29/18 0948 01/10/19 1140 01/27/19 1129 02/21/19 1142     Core Components/Risk Factors/Patient Goals Review   Personal Goals Review  Weight Management/Obesity;Diabetes;Hypertension;Heart Failure;Lipids  Weight Management/Obesity;Diabetes;Hypertension;Heart Failure;Lipids  Weight Management/Obesity;Hypertension;Heart Failure;Diabetes  Weight Management/Obesity;Hypertension;Heart Failure;Diabetes  Weight Management/Obesity;Hypertension;Heart Failure;Diabetes   Review  [name] is off to a good start in rehab. Today, he noted that his heart rate was a little higher today and his blood pressure was lower than normal. He noted that his weight is up a pound as well as he weighs daily.  He does not feeling dizzy or lightheaded but does not some swelling with the extra weight on board.  We talked about using his extra lasix today to help get back down.  He normally checks his pressures daily at home and they were running steady until today.  Overally, his blood sugars have been doing better and staying steadier.  His last A1c was 6.4.  Normally, his weight is pretty  steady and he is still trying to lose some more.   [name] is doing well with his weight and staying down around 202 lbs.  His blood sugars have been doing well and were around 118 this morning.  His blood pressures are doing well and he continues to check them at home; this mornimg he was 118/92 and pulse was 91.  He denies any symptoms of his heart failure and is watching his sodium.  However, he would like to get his salt shaker back.  He is doing well with his medications.   [name] continues to keep an eye on his weight and blood pressures.  He is doing his best to stay on top of his heart failure.  He noted that his heart rate is starting to come back down again as well.  He is eating better and has found this to help all of his numbers as well.  [name] has been doing well at home.  His weight has been steady and he checks it daily.  He is also doing well with his blood pressures.  He did not note any heart failure symptoms.    [name] is doing well.  His weight is up about 5 lbs since being home.  [facility] and his EF is now up to 30%!  His pressures have been doing well and he feels pretty good overall!!.    Expected Outcomes  Short: Continue to keep eye on heart rate and blood pressures. Long: Continue to manage heart failure.   Short: Continue to work on weight loss.  Long; Continue to manage his heart failure.   Short: Continue to work on weight loss through diet and exercise.  Long; Continue to manage his heart failure.   Short: Continue to monitor weight.  Long: Continue to manage heart failure symptoms.   Short: Continue to monitor symptoms closely since starting Entresto.  Long: Continue to work on weight loss.    RRoyName 03/15/19 1159 04/17/19 0918           Core Components/Risk Factors/Patient Goals Review  Personal Goals Review  Weight Management/Obesity;Hypertension;Heart Failure;Diabetes  Weight Management/Obesity;Hypertension;Heart Failure;Diabetes      Review   [name] is doing okay.  Weight continues to be stable.  He did not tolerate the Entresto well as it was dropping his blood pressure; he stopped taking it.  He has continued to have feelings of general weakness.  I encouraged him to contact his doctor if he does not start feeling better.   [name]'s weight was 210 lbs.  which is close to wear he started the program.  His pressures have been doing good as have his sugars.  He did not tolerate the Entresto well and had to come off.  He denies any further symptoms of heart failure.  He is at EF of 30%.      Expected Outcomes  Short: Call doctor if not improved.  Long: Continue to work on weight loss.   Short: Continue to lose weight again.  Long: Continue to manage heart failure.         Core Components/Risk Factors/Patient Goals at Discharge (Final Review):  Goals and Risk Factor Review - 04/17/19 0918      Core Components/Risk Factors/Patient Goals Review   Personal Goals Review  Weight Management/Obesity;Hypertension;Heart Failure;Diabetes    Review  [name]'s weight was 210 lbs.  which is close to wear he started the program.  His pressures have been doing good as have his sugars.  He did not tolerate the Entresto well and had to come off.  He denies any further symptoms of heart failure.  He is at EF of 30%.    Expected Outcomes  Short: Continue to lose weight again.  Long: Continue to manage heart failure.       ITP Comments: ITP Comments    Row Name 11/10/18 1457 12/07/18 0559 12/27/18 1614 01/04/19 1149 04/13/19 0912   ITP Comments  Med Review completed. Initial ITP created, diagnosis can be found in [facility] encounter 12/16.   30 day review. Continue with ITP unless directed changes by Medical Director chart review.  Our program is currently closed due to COVID-19.  We are communicating with patient via phone calls and emails.    30 day review. Continue with ITP unless directed changes by Medical Director chart review.  Returned to rehab on 04/13/2019   Row  Name 04/26/19 1426           ITP Comments  30 day review cycle restarting  after being closed since March 16 because of  Covid 19 pandemic. Program opened to patients on July 6. Not all have returned. ITP updated and sent to Medical Director for review,changes as needed and signature          Comments:

## 2019-04-26 NOTE — Progress Notes (Signed)
Daily Session Note  Patient Details  Name: Casey Peters MRN: [id number] Date of Birth: September 26, 1946 Referring Provider:     Cardiac Rehab from 11/10/2018 in [facility]iac and Pul[facility] Rehab  Referring Provider  Fath      Encounter Date: 04/26/2019  Check In: Session Check In - 04/26/19 0913      Check-In   Supervising physician immediately available to respond to emergencies  See telemetry face sheet for immediately available ER MD    Location  [facility]-Cardiac & Pulmonary Rehab    Staff Present  Alberteen [name], MA, RCEP, CCRP, CCET;Joseph [place];Heath Lark, RN, BSN, CCRP    Virtual Visit  No    Medication changes reported      No    Fall or balance concerns reported     No    Warm-up and Cool-down  Performed on first and last piece of equipment    Resistance Training Performed  Yes    VAD Patient?  No    PAD/SET Patient?  No      Pain Assessment   Currently in Pain?  No/denies          Social History   Tobacco Use  Smoking Status Current Every Day Smoker  . Types: Cigars  Smokeless Tobacco Never Used  Tobacco Comment   2-3 cigars per day    Goals Met:  Independence with exercise equipment Exercise tolerated well No report of cardiac concerns or symptoms Strength training completed today  Goals Unmet:  Not Applicable  Comments: Pt able to follow exercise prescription today without complaint.  Will continue to monitor for progression.    Dr. Emily Filbert is Medical Director for [place] and [facility].

## 2019-04-27 ENCOUNTER — Encounter: Payer: Medicare Other | Admitting: *Deleted

## 2019-04-27 DIAGNOSIS — I5022 Chronic systolic (congestive) heart failure: Secondary | ICD-10-CM | POA: Diagnosis not present

## 2019-04-27 NOTE — Patient Instructions (Signed)
Discharge Patient Instructions  Patient Details  Name: Casey Peters MRN: [id number] Date of Birth: 1946-09-10 Referring Provider:  Baxter Hire, MD   Number of Visits: 36  Reason for Discharge:  Patient reached a stable level of exercise. Patient independent in their exercise. Patient has met program and personal goals.  Smoking History:  Social History   Tobacco Use  Smoking Status Current Every Day Smoker  . Types: Cigars  Smokeless Tobacco Never Used  Tobacco Comment   2-3 cigars per day    Diagnosis:  Heart failure, chronic systolic (HCC)  Initial Exercise Prescription: Initial Exercise Prescription - 11/10/18 1500      Date of Initial Exercise RX and Referring Provider   Date  11/10/18    Referring Provider  Fath      Treadmill   MPH  2    Grade  0.5    Minutes  15    METs  2.5      Recumbant Bike   Level  3    RPM  60    Watts  22    Minutes  15    METs  2.5      NuStep   Level  2    SPM  80    Minutes  15    METs  2.5      Prescription Details   Frequency (times per week)  3    Duration  Progress to 45 minutes of aerobic exercise without signs/symptoms of physical distress      Intensity   THRR 40-80% of Max Heartrate  117-138    Ratings of Perceived Exertion  11-15    Perceived Dyspnea  0-4      Resistance Training   Training Prescription  Yes    Weight  3 lb    Reps  10-15       Discharge Exercise Prescription (Final Exercise Prescription Changes): Exercise Prescription Changes - 12/27/18 1600      Response to Exercise   Blood Pressure (Admit)  124/56    Blood Pressure (Exercise)  128/70    Blood Pressure (Exit)  126/74    Heart Rate (Admit)  93 bpm    Heart Rate (Exercise)  113 bpm    Heart Rate (Exit)  98 bpm    Rating of Perceived Exertion (Exercise)  14    Symptoms  none    Duration  Continue with 45 min of aerobic exercise without signs/symptoms of physical distress.    Intensity  THRR unchanged       Progression   Progression  Continue to progress workloads to maintain intensity without signs/symptoms of physical distress.    Average METs  3.41      Resistance Training   Training Prescription  Yes    Weight  3 lbs    Reps  10-15      Interval Training   Interval Training  No      Treadmill   MPH  2.5    Grade  1.5    Minutes  15    METs  3.34      Recumbant Bike   Level  3    Minutes  15    METs  2.74      NuStep   Level  5    Minutes  15    METs  4.1      Home Exercise Plan   Plans to continue exercise at  Home (comment)   walking  Frequency  Add 2 additional days to program exercise sessions.    Initial Home Exercises Provided  11/25/18       Functional Capacity: 6 Minute Walk    Row Name 11/10/18 1457 04/24/19 0926       6 Minute Walk   Phase  Initial  Discharge    Distance  1270 feet  1450 feet    Distance % Change  -  14.17 %    Distance Feet Change  -  1270 ft    Walk Time  6 minutes  6 minutes    # of Rest Breaks  0  0    MPH  2.4  2.74    METS  2.7  2.72    RPE  9  9    VO2 Peak  9.5  9.54    Symptoms  No  No    Resting HR  96 bpm  86 bpm    Resting BP  98/68  104/62    Resting Oxygen Saturation   96 %  -    Exercise Oxygen Saturation  during 6 min walk  98 %  -    Max Ex. HR  111 bpm  99 bpm    Max Ex. BP  114/68  98/62    2 Minute Post BP  124/64  -       Quality of Life: Quality of Life - 04/13/19 1123      Quality of Life Scores   Health/Function Pre  18.83 %    Health/Function Post  21.73 %    Health/Function % Change  15.4 %    Socioeconomic Pre  28 %    Socioeconomic Post  25.33 %    Socioeconomic % Change   -9.54 %    Psych/Spiritual Pre  23.79 %    Psych/Spiritual Post  28.57 %    Psych/Spiritual % Change  20.09 %    Family Pre  25.5 %    Family Post  22.8 %    Family % Change  -10.59 %    GLOBAL Pre  22.56 %    GLOBAL Post  24 %    GLOBAL % Change  6.38 %       Personal Goals: Goals established at orientation  with interventions provided to work toward goal. Personal Goals and Risk Factors at Admission - 11/10/18 1526      Core Components/Risk Factors/Patient Goals on Admission    Weight Management  Yes;Weight Loss;Weight Maintenance    Intervention  Weight Management: Develop a combined nutrition and exercise program designed to reach desired caloric intake, while maintaining appropriate intake of nutrient and fiber, sodium and fats, and appropriate energy expenditure required for the weight goal.;Weight Management: Provide education and appropriate resources to help participant work on and attain dietary goals.;Weight Management/Obesity: Establish reasonable short term and long term weight goals.    Admit Weight  209 lb (94.8 kg)    Goal Weight: Short Term  205 lb (93 kg)    Goal Weight: Long Term  200 lb (90.7 kg)    Expected Outcomes  Short Term: Continue to assess and modify interventions until short term weight is achieved;Long Term: Adherence to nutrition and physical activity/exercise program aimed toward attainment of established weight goal;Weight Loss: Understanding of general recommendations for a balanced deficit meal plan, which promotes 1-2 lb weight loss per week and includes a negative energy balance of [phone] kcal/d;Understanding of distribution of calorie intake throughout the day  with the consumption of 4-5 meals/snacks    Tobacco Cessation  Yes    Intervention  Assist the participant in steps to quit. Provide individualized education and counseling about committing to Tobacco Cessation, relapse prevention, and pharmacological support that can be provided by physician.;Advice worker, assist with locating and accessing local/national Quit Smoking programs, and support quit date choice.    Expected Outcomes  Short Term: Will demonstrate readiness to quit, by selecting a quit date.;Short Term: Will quit all tobacco product use, adhering to prevention of relapse plan.;Long  Term: Complete abstinence from all tobacco products for at least 12 months from quit date.    Diabetes  Yes    Intervention  Provide education about signs/symptoms and action to take for hypo/hyperglycemia.;Provide education about proper nutrition, including hydration, and aerobic/resistive exercise prescription along with prescribed medications to achieve blood glucose in normal ranges: Fasting glucose 65-99 mg/dL    Expected Outcomes  Short Term: Participant verbalizes understanding of the signs/symptoms and immediate care of hyper/hypoglycemia, proper foot care and importance of medication, aerobic/resistive exercise and nutrition plan for blood glucose control.;Long Term: Attainment of HbA1C < 7%.    Heart Failure  Yes    Intervention  Provide a combined exercise and nutrition program that is supplemented with education, support and counseling about heart failure. Directed toward relieving symptoms such as shortness of breath, decreased exercise tolerance, and extremity edema.    Expected Outcomes  Improve functional capacity of life;Short term: Attendance in program 2-3 days a week with increased exercise capacity. Reported lower sodium intake. Reported increased fruit and vegetable intake. Reports medication compliance.;Short term: Daily weights obtained and reported for increase. Utilizing diuretic protocols set by physician.;Long term: Adoption of self-care skills and reduction of barriers for early signs and symptoms recognition and intervention leading to self-care maintenance.    Hypertension  Yes    Intervention  Provide education on lifestyle modifcations including regular physical activity/exercise, weight management, moderate sodium restriction and increased consumption of fresh fruit, vegetables, and low fat dairy, alcohol moderation, and smoking cessation.;Monitor prescription use compliance.    Expected Outcomes  Short Term: Continued assessment and intervention until BP is < 140/32m HG  in hypertensive participants. < 130/83mHG in hypertensive participants with diabetes, heart failure or chronic kidney disease.;Long Term: Maintenance of blood pressure at goal levels.    Lipids  Yes    Intervention  Provide education and support for participant on nutrition & aerobic/resistive exercise along with prescribed medications to achieve LDL '70mg'$ , HDL >'40mg'$ .    Expected Outcomes  Short Term: Participant states understanding of desired cholesterol values and is compliant with medications prescribed. Participant is following exercise prescription and nutrition guidelines.;Long Term: Cholesterol controlled with medications as prescribed, with individualized exercise RX and with personalized nutrition plan. Value goals: LDL < '70mg'$ , HDL > 40 mg.        Personal Goals Discharge: Goals and Risk Factor Review - 04/17/19 0918      Core Components/Risk Factors/Patient Goals Review   Personal Goals Review  Weight Management/Obesity;Hypertension;Heart Failure;Diabetes    Review  Casey Peters's weight was 210 lbs.  which is close to wear he started the program.  His pressures have been doing good as have his sugars.  He did not tolerate the Entresto well and had to come off.  He denies any further symptoms of heart failure.  He is at EF of 30%.    Expected Outcomes  Short: Continue to lose weight again.  Long: Continue to manage  heart failure.       Exercise Goals and Review: Exercise Goals    Row Name 11/10/18 1457             Exercise Goals   Increase Physical Activity  Yes       Intervention  Provide advice, education, support and counseling about physical activity/exercise needs.;Develop an individualized exercise prescription for aerobic and resistive training based on initial evaluation findings, risk stratification, comorbidities and participant's personal goals.       Expected Outcomes  Short Term: Attend rehab on a regular basis to increase amount of physical activity.;Long Term: Add in  home exercise to make exercise part of routine and to increase amount of physical activity.;Long Term: Exercising regularly at least 3-5 days a week.       Increase Strength and Stamina  Yes       Intervention  Provide advice, education, support and counseling about physical activity/exercise needs.;Develop an individualized exercise prescription for aerobic and resistive training based on initial evaluation findings, risk stratification, comorbidities and participant's personal goals.       Expected Outcomes  Short Term: Increase workloads from initial exercise prescription for resistance, speed, and METs.;Short Term: Perform resistance training exercises routinely during rehab and add in resistance training at home;Long Term: Improve cardiorespiratory fitness, muscular endurance and strength as measured by increased METs and functional capacity (6MWT)       Able to understand and use rate of perceived exertion (RPE) scale  Yes       Intervention  Provide education and explanation on how to use RPE scale       Expected Outcomes  Short Term: Able to use RPE daily in rehab to express subjective intensity level;Long Term:  Able to use RPE to guide intensity level when exercising independently       Able to understand and use Dyspnea scale  Yes       Intervention  Provide education and explanation on how to use Dyspnea scale       Expected Outcomes  Short Term: Able to use Dyspnea scale daily in rehab to express subjective sense of shortness of breath during exertion;Long Term: Able to use Dyspnea scale to guide intensity level when exercising independently       Knowledge and understanding of Target Heart Rate Range (THRR)  Yes       Intervention  Provide education and explanation of THRR including how the numbers were predicted and where they are located for reference       Expected Outcomes  Short Term: Able to state/look up THRR;Short Term: Able to use daily as guideline for intensity in rehab;Long  Term: Able to use THRR to govern intensity when exercising independently       Able to check pulse independently  Yes       Intervention  Provide education and demonstration on how to check pulse in carotid and radial arteries.;Review the importance of being able to check your own pulse for safety during independent exercise       Expected Outcomes  Short Term: Able to explain why pulse checking is important during independent exercise;Long Term: Able to check pulse independently and accurately       Understanding of Exercise Prescription  Yes       Intervention  Provide education, explanation, and written materials on patient's individual exercise prescription       Expected Outcomes  Short Term: Able to explain program exercise prescription;Long Term: Able to explain  home exercise prescription to exercise independently          Exercise Goals Re-Evaluation: Exercise Goals Re-Evaluation    Row Name 11/16/18 0825 11/25/18 0809 12/01/18 0827 12/14/18 1130 12/27/18 1614     Exercise Goal Re-Evaluation   Exercise Goals Review  Able to understand and use rate of perceived exertion (RPE) scale;Knowledge and understanding of Target Heart Rate Range (THRR);Understanding of Exercise Prescription  Increase Physical Activity;Increase Strength and Stamina;Able to understand and use rate of perceived exertion (RPE) scale;Knowledge and understanding of Target Heart Rate Range (THRR);Understanding of Exercise Prescription;Able to check pulse independently  Increase Physical Activity;Increase Strength and Stamina;Understanding of Exercise Prescription  Increase Physical Activity;Increase Strength and Stamina;Understanding of Exercise Prescription  Increase Physical Activity;Increase Strength and Stamina;Understanding of Exercise Prescription   Comments  Reviewed RPE scale, THR and program prescription with pt today.  Pt voiced understanding and was given a copy of goals to take home.   Casey Peters is off to a good start in  rehab.  He struggled his first couple of days and needed a nap after class.  This week he has been able to go and go without his nap.  Reviewed home exercise with pt today.  Pt plans to walking at home for exercise.   He has a 0.6 mile loop that he walks.  We talked about increasing his time to 30 min.  Reviewed THR, pulse, RPE, sign and symptoms, NTG use, and when to call 911 or MD.  Also discussed weather considerations and indoor options.  Pt voiced understanding.  Casey Peters is doing well in rehab. He has already started to add in his home exercise and trying to increase to two laps at home without getting too tired.  He is up to 34 watts on the bike.  We will continue to monitor his progress.   Casey Peters has been doing well in rehab.  He continues to make improvements and is now up to 2.1 mph and 1.5% on treadmill.  We will continue to monitor his progress.   Casey Peters continues to do well in rehab.  He is walking at home.  We will continue to encourage him to keep moving at home and will continue to monitor his progress at home.    Expected Outcomes  Short: Use RPE daily to regulate intensity. Long: Follow program prescription in THR.  Short: Start to increase walking time to 30 min.  Long: Continue to increase strength and stamina.   Short: Continue to increase workloads. Long: Continue to increase strength and stamina.   Short: Continue to increase workloads.  Long: Continue to increase stamina.  Short: Continue to walk at home. Long: Continue to increase strength and stamina.    [place] Name 12/29/18 1696 01/10/19 1132 01/27/19 1124 02/21/19 1136 03/15/19 1156     Exercise Goal Re-Evaluation   Exercise Goals Review  Increase Physical Activity;Increase Strength and Stamina;Understanding of Exercise Prescription  Increase Physical Activity;Increase Strength and Stamina;Understanding of Exercise Prescription  Increase Physical Activity;Increase Strength and Stamina;Understanding of Exercise Prescription  Increase Physical  Activity;Increase Strength and Stamina;Understanding of Exercise Prescription  Increase Physical Activity;Increase Strength and Stamina;Understanding of Exercise Prescription   Comments  Casey Peters is doing well at home.  He is up to 2-3 laps on his track at home, which took about 40-45 min.  He is starting to get back his strength and stamina but he wishes it would go faster.  He was added to the email list serve today.   Casey Peters continues  to walk at home. He now up to 3 laps consistently every other day.  It is now taking him between 41-43 minutes each day.  He is getting faster each week!  He has not been able to get our email yet.  Verified his email address was right, so I encouraged them to check their junk box.    Casey Peters has been walking at home.  Some days he is able to do a little more than his three laps.  Last weekend, he got close to 4 laps, but then felt like he over did it that day.  I encouraged him to continue to build slowly and continue to work on his stamina.   Casey Peters continues to walk at home.  He and his wife are doing 2.5 miles every other day.  He is doing well with it, and no longer feeling exhausted afterwards.  He said that he feels that his stamina has improved.    Casey Peters has been doing well.  The last two week, he hasn't been able to get out to walk as much due to the rain. He is hoping to get back into his routine again this week.  He continues to have some weak feelings.  He wishes his recovery would be faster.  At his last check, his EF had improved but he still feels weak.  I encouraged him to call his cardiologist if these feelings of weakness continue.    Expected Outcomes  Short: Continue to walk track at home and try videos.  Long: Continue to build strength and stamina.   Short: Continue to walk at least three to four times a week. Long: Continue to maintain physcial activity.   Short: Continue to walk.  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   Short: Continue to walk. Long: Continue to  increase stamina.   Short: Call doctor if weakness does not go away.  Long: Continue to work stamina.    [place] Name 04/17/19 0915             Exercise Goal Re-Evaluation   Exercise Goals Review  Increase Physical Activity;Increase Strength and Stamina;Understanding of Exercise Prescription       Comments  Casey Peters is happy to be back in rehab.  He will be doing his post 6MWT soon and we expect him to improve.  He had a spell while out and had to lay down in shop.  He went to see his doctor and they could not find anything.  He is starting to feel better now.  He is planning to continue to walk after graduation.       Expected Outcomes  Short: Improve post 6MWT.  Long: Continue to walk independently.          Nutrition & Weight - Outcomes: Pre Biometrics - 04/24/19 0949      Pre Biometrics   Height  _0  (1.803 m)    Weight  211 lb (95.7 kg)    BMI (Calculated)  29.44    Single Leg Stand  4.47 seconds        Nutrition: Nutrition Therapy & Goals - 12/29/18 0946      Nutrition Therapy   Diet  Diabetic, low sodium, heart healthy keto diet (pt request for diabetes control) with fluid restriction <1L per MD ~1800kcal (waist 42.2inches!; ref <40inches)(BMI 29)    Protein (specify units)  75-80g    Fiber  30 grams    Whole Grain Foods  1 servings  Keto diet, avoids grains, recommend to still get at least 1 serving a day   Saturated Fats  12 max. grams   <12-16g   Fruits and Vegetables  8 servings/day    Sodium  1.5 grams      Personal Nutrition Goals   Nutrition Goal  Continue with previous goals. ST: get back to the program LT: HH, low Na, lean proteins (but will still have hamburgers, reiterated limiting staurated fats)    Comments  Discussed the importance of quality healthy fats for heart health in addition to Na which he and his wife have been following; discussed what healthy fats were vs unhealthy fats and relayed recommendations such as saturated fat <7% of kcal or <12-16g/day.  BG under control while at home due to keto diet. Pt not very willing to share much information, passed phone to wife for most of the conversation. Wife reports his Na is under 2g/day and his fluid is between 1-2L. Wife reports eating low sodium, low CHO, but cholesterol has been on the high side of normal.       Intervention Plan   Intervention  Prescribe, educate and counsel regarding individualized specific dietary modifications aiming towards targeted core components such as weight, hypertension, lipid management, diabetes, heart failure and other comorbidities.    Expected Outcomes  Short Term Goal: Understand basic principles of dietary content, such as calories, fat, sodium, cholesterol and nutrients.;Long Term Goal: Adherence to prescribed nutrition plan.       Nutrition Discharge: Nutrition Assessments - 11/10/18 1520      MEDFICTS Scores   Pre Score  42       Education Questionnaire Score: Knowledge Questionnaire Score - 04/13/19 1123      Knowledge Questionnaire Score   Pre Score  20/26    Post Score  25/26       Goals reviewed with patient; copy given to patient.

## 2019-04-27 NOTE — Progress Notes (Signed)
Daily Session Note  Patient Details  Name: Casey Peters MRN: [id number] Date of Birth: 1945/11/25 Referring Provider:     Cardiac Rehab from 11/10/2018 in [facility]iac [facility] Re[facility]  Referring Provider  Casey Peters      Encounter Date: 04/27/2019  Check In: Session Check In - 04/27/19 0840      Check-In   Supervising physician immediately available to respond to emergencies  See telemetry face sheet for immediately available ER MD    Location  [facility]-Cardiac & Pulmonary Rehab    Staff Present  Casey Peters BS, Exercise Physiologist;Casey Peters Casey Pulling, MA, RCEP, CCRP, CCET;Casey Bice, RN, BSN, CCRP    Virtual Visit  No    Medication changes reported      No    Fall or balance concerns reported     No    Warm-up and Cool-down  Performed on first and last piece of equipment    Resistance Training Performed  Yes    VAD Patient?  No    PAD/SET Patient?  No      Pain Assessment   Currently in Pain?  No/denies          Social History   Tobacco Use  Smoking Status Current Every Day Smoker  . Types: Cigars  Smokeless Tobacco Never Used  Tobacco Comment   2-3 cigars per day    Goals Met:  Independence with exercise equipment Exercise tolerated well No report of cardiac concerns or symptoms Strength training completed today  Goals Unmet:  Not Applicable  Comments: Pt able to follow exercise prescription today without complaint.  Will continue to monitor for progression.  Casey Peters had some increased ectopy today.  Will send to MD.    Dr. Emily Peters is Medical Director for [place] and [facility].

## 2019-05-01 ENCOUNTER — Encounter: Payer: Medicare Other | Admitting: *Deleted

## 2019-05-01 ENCOUNTER — Other Ambulatory Visit: Payer: Self-pay

## 2019-05-01 DIAGNOSIS — I5022 Chronic systolic (congestive) heart failure: Secondary | ICD-10-CM | POA: Diagnosis not present

## 2019-05-01 NOTE — Progress Notes (Signed)
Daily Session Note  Patient Details  Name: Casey Peters MRN: [id number] Date of Birth: 12-19-1945 Referring Provider:     Cardiac Rehab from 11/10/2018 in [facility]  Referring Provider  Fath      Encounter Date: 05/01/2019  Check In: Session Check In - 05/01/19 0902      Check-In   Supervising physician immediately available to respond to emergencies  See telemetry face sheet for immediately available ER MD    Location  [facility]-Cardiac & Pulmonary Rehab    Staff Present  Earlean Shawl, BS, ACSM CEP, Exercise Physiologist;Ameya Vowell Luan Pulling, MA, RCEP, CCRP, CCET;Susanne Bice, RN, BSN, CCRP    Virtual Visit  No    Medication changes reported      No    Fall or balance concerns reported     No    Warm-up and Cool-down  Performed on first and last piece of equipment    Resistance Training Performed  Yes    VAD Patient?  No    PAD/SET Patient?  No      Pain Assessment   Currently in Pain?  No/denies          Social History   Tobacco Use  Smoking Status Current Every Day Smoker  . Types: Cigars  Smokeless Tobacco Never Used  Tobacco Comment   2-3 cigars per day    Goals Met:  Independence with exercise equipment Exercise tolerated well Personal goals reviewed No report of cardiac concerns or symptoms Strength training completed today  Goals Unmet:  Not Applicable  Comments:  Casey Peters graduated today from  rehab with 36 sessions completed.  Details of the patient's exercise prescription and what He needs to do in order to continue the prescription and progress were discussed with patient.  Patient was given a copy of prescription and goals.  Patient verbalized understanding.  Casey Peters plans to continue to exercise by walking at home.    Dr. Emily Filbert is Medical Director for [facility].

## 2019-05-01 NOTE — Progress Notes (Signed)
Cardiac Individual Treatment Plan  Patient Details  Name: Casey Peters MRN: [id number] Date of Birth: January 06, 1946 Referring Provider:     Cardiac Rehab from 11/10/2018 in [facility] Cardiac [facility] Pulmonary Rehab  Referring Provider  Fath      Initial Encounter Date:    Cardiac Rehab from 11/10/2018 in [facility] Cardiac and Pulmonary Rehab  Date  11/10/18      Visit Diagnosis: No diagnosis found.  Patient's Home Medications on Admission:  Current Outpatient Medications:  .  aspirin EC 81 MG tablet, Take 81 mg by mouth daily., Disp: , Rfl:  .  carvedilol (COREG) 3.125 MG tablet, Take 3.125 mg by mouth 2 (two) times daily., Disp: , Rfl:  .  furosemide (LASIX) 40 MG tablet, Take 40 mg by mouth 2 (two) times daily., Disp: , Rfl:  .  metFORMIN (GLUCOPHAGE) 1000 MG tablet, Take 500 mg by mouth daily with breakfast. , Disp: , Rfl:  .  ONE TOUCH ULTRA TEST test strip, USE 1 STRIP 3 TIMES A DAY TO CHECK BLOOD GLUCOSE LEVELS, Disp: , Rfl:  .  spironolactone (ALDACTONE) 25 MG tablet, Take 25 mg by mouth daily., Disp: , Rfl:  .  sulfamethoxazole-trimethoprim (BACTRIM DS,SEPTRA DS) 800-160 MG tablet, Take 1 tablet by mouth 2 (two) times daily. (Patient not taking: Reported on 04/05/2019), Disp: 14 tablet, Rfl: 0 .  testosterone (ANDROGEL) 50 MG/5GM (1%) GEL, Apply 5 g topically daily., Disp: , Rfl:   Past Medical History: Past Medical History:  Diagnosis Date  . BP (high blood pressure) 06/08/2014  . CHF (congestive heart failure) ([place])   . Constipation   . Diabetes ([place])   . HLD (hyperlipidemia)   . HTN (hypertension)   . Rhinitis     Tobacco Use: Social History   Tobacco Use  Smoking Status Current Every Day Smoker  . Types: Cigars  Smokeless Tobacco Never Used  Tobacco Comment   2-3 cigars per day    Labs: Recent Review Flowsheet Data    There is no flowsheet data to display.       Exercise Target Goals: Exercise Program Goal: Individual exercise prescription set using  results from initial 6 min walk test and THRR while considering  patient's activity barriers and safety.   Exercise Prescription Goal: Initial exercise prescription builds to 30-45 minutes a day of aerobic activity, 2-3 days per week.  Home exercise guidelines will be given to patient during program as part of exercise prescription that the participant will acknowledge.  Activity Barriers & Risk Stratification: Activity Barriers & Cardiac Risk Stratification - 11/10/18 1506      Activity Barriers & Cardiac Risk Stratification   Activity Barriers  None    Cardiac Risk Stratification  High       6 Minute Walk: 6 Minute Walk    Row Name 11/10/18 1457 04/24/19 0926       6 Minute Walk   Phase  Initial  Discharge    Distance  1270 feet  1450 feet    Distance % Change  -  14.17 %    Distance Feet Change  -  1270 ft    Walk Time  6 minutes  6 minutes    # of Rest Breaks  0  0    MPH  2.4  2.74    METS  2.7  2.72    RPE  9  9    VO2 Peak  9.5  9.54    Symptoms  No  No  Resting HR  96 bpm  86 bpm    Resting BP  98/68  104/62    Resting Oxygen Saturation   96 %  -    Exercise Oxygen Saturation  during 6 min walk  98 %  -    Max Ex. HR  111 bpm  99 bpm    Max Ex. BP  114/68  98/62    2 Minute Post BP  124/64  -       Oxygen Initial Assessment:   Oxygen Re-Evaluation:   Oxygen Discharge (Final Oxygen Re-Evaluation):   Initial Exercise Prescription: Initial Exercise Prescription - 11/10/18 1500      Date of Initial Exercise RX and Referring Provider   Date  11/10/18    Referring Provider  Fath      Treadmill   MPH  2    Grade  0.5    Minutes  15    METs  2.5      Recumbant Bike   Level  3    RPM  60    Watts  22    Minutes  15    METs  2.5      NuStep   Level  2    SPM  80    Minutes  15    METs  2.5      Prescription Details   Frequency (times per week)  3    Duration  Progress to 45 minutes of aerobic exercise without signs/symptoms of physical  distress      Intensity   THRR 40-80% of Max Heartrate  117-138    Ratings of Perceived Exertion  11-15    Perceived Dyspnea  0-4      Resistance Training   Training Prescription  Yes    Weight  3 lb    Reps  10-15       Perform Capillary Blood Glucose checks as needed.  Exercise Prescription Changes: Exercise Prescription Changes    Row Name 11/10/18 1500 12/01/18 0800 12/14/18 1100 12/27/18 1600       Response to Exercise   Blood Pressure (Admit)  98/68  132/74  104/62  124/56    Blood Pressure (Exercise)  114/68  124/74  142/64  128/70    Blood Pressure (Exit)  124/64  108/60  100/60  126/74    Heart Rate (Admit)  101 bpm  93 bpm  96 bpm  93 bpm    Heart Rate (Exercise)  111 bpm  115 bpm  119 bpm  113 bpm    Heart Rate (Exit)  100 bpm  100 bpm  97 bpm  98 bpm    Oxygen Saturation (Admit)  96 %  -  -  -    Oxygen Saturation (Exit)  98 %  -  -  -    Rating of Perceived Exertion (Exercise)  _0 Symptoms  -  none  none  none    Duration  -  Continue with 45 min of aerobic exercise without signs/symptoms of physical distress.  Continue with 45 min of aerobic exercise without signs/symptoms of physical distress.  Continue with 45 min of aerobic exercise without signs/symptoms of physical distress.    Intensity  -  THRR unchanged  THRR unchanged  THRR unchanged      Progression   Progression  -  Continue to progress workloads to maintain intensity without signs/symptoms of physical distress.  Continue to  progress workloads to maintain intensity without signs/symptoms of physical distress.  Continue to progress workloads to maintain intensity without signs/symptoms of physical distress.    Average METs  -  2.87  3.7  3.41      Resistance Training   Training Prescription  -  Yes  Yes  Yes    Weight  -  3 lb  3 lbs  3 lbs    Reps  -  10-15  10-15  10-15      Interval Training   Interval Training  -  No  No  No      Treadmill   MPH  -  1.5  2.1  2.5    Grade  -   1  1.5  1.5    Minutes  -  _0 METs  -  2.36  3.04  3.34      Recumbant Bike   Level  -  _1 Watts  -  34  -  -    Minutes  -  _2 METs  -  3.16  3.16  2.74      NuStep   Level  -  _3 Minutes  -  _4 METs  -  3.1  4.9  4.1      Home Exercise Plan   Plans to continue exercise at  -  Home (comment) walking  Home (comment) walking  Home (comment) walking    Frequency  -  Add 2 additional days to program exercise sessions.  Add 2 additional days to program exercise sessions.  Add 2 additional days to program exercise sessions.    Initial Home Exercises Provided  -  11/25/18  11/25/18  11/25/18       Exercise Comments: Exercise Comments    Row Name 11/16/18 0825 05/01/19 6010         Exercise Comments  First full day of exercise!  Patient was oriented to gym and equipment including functions, settings, policies, and procedures.  Patient's individual exercise prescription and treatment plan were reviewed.  All starting workloads were established based on the results of the 6 minute walk test done at initial orientation visit.  The plan for exercise progression was also introduced and progression will be customized based on patient's performance and goals.  Quasean graduated today from  rehab with 36 sessions completed.  Details of the patient's exercise prescription and what He needs to do in order to continue the prescription and progress were discussed with patient.  Patient was given a copy of prescription and goals.  Patient verbalized understanding.  Lekeith plans to continue to exercise by walking at home.         Exercise Goals and Review: Exercise Goals    Row Name 11/10/18 1457             Exercise Goals   Increase Physical Activity  Yes       Intervention  Provide advice, education, support and counseling about physical activity/exercise needs.;Develop an individualized exercise prescription for aerobic and resistive training  based on initial evaluation findings, risk stratification, comorbidities and participant's personal goals.       Expected Outcomes  Short Term: Attend rehab on a regular basis to increase amount of physical activity.;Long Term: Add in home exercise to make  exercise part of routine and to increase amount of physical activity.;Long Term: Exercising regularly at least 3-5 days a week.       Increase Strength and Stamina  Yes       Intervention  Provide advice, education, support and counseling about physical activity/exercise needs.;Develop an individualized exercise prescription for aerobic and resistive training based on initial evaluation findings, risk stratification, comorbidities and participant's personal goals.       Expected Outcomes  Short Term: Increase workloads from initial exercise prescription for resistance, speed, and METs.;Short Term: Perform resistance training exercises routinely during rehab and add in resistance training at home;Long Term: Improve cardiorespiratory fitness, muscular endurance and strength as measured by increased METs and functional capacity (6MWT)       Able to understand and use rate of perceived exertion (RPE) scale  Yes       Intervention  Provide education and explanation on how to use RPE scale       Expected Outcomes  Short Term: Able to use RPE daily in rehab to express subjective intensity level;Long Term:  Able to use RPE to guide intensity level when exercising independently       Able to understand and use Dyspnea scale  Yes       Intervention  Provide education and explanation on how to use Dyspnea scale       Expected Outcomes  Short Term: Able to use Dyspnea scale daily in rehab to express subjective sense of shortness of breath during exertion;Long Term: Able to use Dyspnea scale to guide intensity level when exercising independently       Knowledge and understanding of Target Heart Rate Range (THRR)  Yes       Intervention  Provide education and  explanation of THRR including how the numbers were predicted and where they are located for reference       Expected Outcomes  Short Term: Able to state/look up THRR;Short Term: Able to use daily as guideline for intensity in rehab;Long Term: Able to use THRR to govern intensity when exercising independently       Able to check pulse independently  Yes       Intervention  Provide education and demonstration on how to check pulse in carotid and radial arteries.;Review the importance of being able to check your own pulse for safety during independent exercise       Expected Outcomes  Short Term: Able to explain why pulse checking is important during independent exercise;Long Term: Able to check pulse independently and accurately       Understanding of Exercise Prescription  Yes       Intervention  Provide education, explanation, and written materials on patient's individual exercise prescription       Expected Outcomes  Short Term: Able to explain program exercise prescription;Long Term: Able to explain home exercise prescription to exercise independently          Exercise Goals Re-Evaluation : Exercise Goals Re-Evaluation    Row Name 11/16/18 0825 11/25/18 0809 12/01/18 0827 12/14/18 1130 12/27/18 1614     Exercise Goal Re-Evaluation   Exercise Goals Review  Able to understand and use rate of perceived exertion (RPE) scale;Knowledge and understanding of Target Heart Rate Range (THRR);Understanding of Exercise Prescription  Increase Physical Activity;Increase Strength and Stamina;Able to understand and use rate of perceived exertion (RPE) scale;Knowledge and understanding of Target Heart Rate Range (THRR);Understanding of Exercise Prescription;Able to check pulse independently  Increase Physical Activity;Increase Strength and Stamina;Understanding of  Exercise Prescription  Increase Physical Activity;Increase Strength and Stamina;Understanding of Exercise Prescription  Increase Physical  Activity;Increase Strength and Stamina;Understanding of Exercise Prescription   Comments  Reviewed RPE scale, THR and program prescription with pt today.  Pt voiced understanding and was given a copy of goals to take home.   Casey Peters is off to a good start in rehab.  He struggled his first couple of days and needed a nap after class.  This week he has been able to go and go without his nap.  Reviewed home exercise with pt today.  Pt plans to walking at home for exercise.   He has a 0.6 mile loop that he walks.  We talked about increasing his time to 30 min.  Reviewed THR, pulse, RPE, sign and symptoms, NTG use, and when to call 911 or MD.  Also discussed weather considerations and indoor options.  Pt voiced understanding.  Casey Peters is doing well in rehab. He has already started to add in his home exercise and trying to increase to two laps at home without getting too tired.  He is up to 34 watts on the bike.  We will continue to monitor his progress.   Casey Peters has been doing well in rehab.  He continues to make improvements and is now up to 2.1 mph and 1.5% on treadmill.  We will continue to monitor his progress.   Casey Peters continues to do well in rehab.  He is walking at home.  We will continue to encourage him to keep moving at home and will continue to monitor his progress at home.    Expected Outcomes  Short: Use RPE daily to regulate intensity. Long: Follow program prescription in THR.  Short: Start to increase walking time to 30 min.  Long: Continue to increase strength and stamina.   Short: Continue to increase workloads. Long: Continue to increase strength and stamina.   Short: Continue to increase workloads.  Long: Continue to increase stamina.  Short: Continue to walk at home. Long: Continue to increase strength and stamina.    [place] Name 12/29/18 1638 01/10/19 1132 01/27/19 1124 02/21/19 1136 03/15/19 1156     Exercise Goal Re-Evaluation   Exercise Goals Review  Increase Physical Activity;Increase Strength and  Stamina;Understanding of Exercise Prescription  Increase Physical Activity;Increase Strength and Stamina;Understanding of Exercise Prescription  Increase Physical Activity;Increase Strength and Stamina;Understanding of Exercise Prescription  Increase Physical Activity;Increase Strength and Stamina;Understanding of Exercise Prescription  Increase Physical Activity;Increase Strength and Stamina;Understanding of Exercise Prescription   Comments  Casey Peters is doing well at home.  He is up to 2-3 laps on his track at home, which took about 40-45 min.  He is starting to get back his strength and stamina but he wishes it would go faster.  He was added to the email list serve today.   Casey Peters continues to walk at home. He now up to 3 laps consistently every other day.  It is now taking him between 41-43 minutes each day.  He is getting faster each week!  He has not been able to get our email yet.  Verified his email address was right, so I encouraged them to check their junk box.    Casey Peters has been walking at home.  Some days he is able to do a little more than his three laps.  Last weekend, he got close to 4 laps, but then felt like he over did it that day.  I encouraged him to continue to build slowly  and continue to work on his stamina.   Casey Peters continues to walk at home.  He and his wife are doing 2.5 miles every other day.  He is doing well with it, and no longer feeling exhausted afterwards.  He said that he feels that his stamina has improved.    Casey Peters has been doing well.  The last two week, he hasn't been able to get out to walk as much due to the rain. He is hoping to get back into his routine again this week.  He continues to have some weak feelings.  He wishes his recovery would be faster.  At his last check, his EF had improved but he still feels weak.  I encouraged him to call his cardiologist if these feelings of weakness continue.    Expected Outcomes  Short: Continue to walk track at home and try videos.  Long: Continue to  build strength and stamina.   Short: Continue to walk at least three to four times a week. Long: Continue to maintain physcial activity.   Short: Continue to walk.  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   Short: Continue to walk. Long: Continue to increase stamina.   Short: Call doctor if weakness does not go away.  Long: Continue to work stamina.    [place] Name 04/17/19 0915             Exercise Goal Re-Evaluation   Exercise Goals Review  Increase Physical Activity;Increase Strength and Stamina;Understanding of Exercise Prescription       Comments  Casey Peters is happy to be back in rehab.  He will be doing his post 6MWT soon and we expect him to improve.  He had a spell while out and had to lay down in shop.  He went to see his doctor and they could not find anything.  He is starting to feel better now.  He is planning to continue to walk after graduation.       Expected Outcomes  Short: Improve post 6MWT.  Long: Continue to walk independently.          Discharge Exercise Prescription (Final Exercise Prescription Changes): Exercise Prescription Changes - 12/27/18 1600      Response to Exercise   Blood Pressure (Admit)  124/56    Blood Pressure (Exercise)  128/70    Blood Pressure (Exit)  126/74    Heart Rate (Admit)  93 bpm    Heart Rate (Exercise)  113 bpm    Heart Rate (Exit)  98 bpm    Rating of Perceived Exertion (Exercise)  14    Symptoms  none    Duration  Continue with 45 min of aerobic exercise without signs/symptoms of physical distress.    Intensity  THRR unchanged      Progression   Progression  Continue to progress workloads to maintain intensity without signs/symptoms of physical distress.    Average METs  3.41      Resistance Training   Training Prescription  Yes    Weight  3 lbs    Reps  10-15      Interval Training   Interval Training  No      Treadmill   MPH  2.5    Grade  1.5    Minutes  15    METs  3.34      Recumbant Bike   Level  3    Minutes  15     METs  2.74  NuStep   Level  5    Minutes  15    METs  4.1      Home Exercise Plan   Plans to continue exercise at  Home (comment)   walking   Frequency  Add 2 additional days to program exercise sessions.    Initial Home Exercises Provided  11/25/18       Nutrition:  Target Goals: Understanding of nutrition guidelines, daily intake of sodium <1556m, cholesterol <204m calories 30% from fat and 7% or less from saturated fats, daily to have 5 or more servings of fruits and vegetables.  Biometrics: Pre Biometrics - 04/24/19 0949      Pre Biometrics   Height  _0  (1.803 m)    Weight  211 lb (95.7 kg)    BMI (Calculated)  29.44    Single Leg Stand  4.47 seconds        Nutrition Therapy Plan and Nutrition Goals: Nutrition Therapy & Goals - 12/29/18 0946      Nutrition Therapy   Diet  Diabetic, low sodium, heart healthy keto diet (pt request for diabetes control) with fluid restriction <1L per MD ~1800kcal (waist 42.2inches!; ref <40inches)(BMI 29)    Protein (specify units)  75-80g    Fiber  30 grams    Whole Grain Foods  1 servings   Keto diet, avoids grains, recommend to still get at least 1 serving a day   Saturated Fats  12 max. grams   <12-16g   Fruits and Vegetables  8 servings/day    Sodium  1.5 grams      Personal Nutrition Goals   Nutrition Goal  Continue with previous goals. ST: get back to the program LT: HH, low Na, lean proteins (but will still have hamburgers, reiterated limiting staurated fats)    Comments  Discussed the importance of quality healthy fats for heart health in addition to Na which he and his wife have been following; discussed what healthy fats were vs unhealthy fats and relayed recommendations such as saturated fat <7% of kcal or <12-16g/day. BG under control while at home due to keto diet. Pt not very willing to share much information, passed phone to wife for most of the conversation. Wife reports his Na is under 2g/day and his  fluid is between 1-2L. Wife reports eating low sodium, low CHO, but cholesterol has been on the high side of normal.       Intervention Plan   Intervention  Prescribe, educate and counsel regarding individualized specific dietary modifications aiming towards targeted core components such as weight, hypertension, lipid management, diabetes, heart failure and other comorbidities.    Expected Outcomes  Short Term Goal: Understand basic principles of dietary content, such as calories, fat, sodium, cholesterol and nutrients.;Long Term Goal: Adherence to prescribed nutrition plan.       Nutrition Assessments: Nutrition Assessments - 11/10/18 1520      MEDFICTS Scores   Pre Score  42       Nutrition Goals Re-Evaluation: Nutrition Goals Re-Evaluation    [place] 11/25/18 0800 03/22/19 1312 04/17/19 1028         Goals   Current Weight  -  206 lb (93.4 kg)  -     Nutrition Goal  Heart healthy, low salt, lean proteins  ST:/LT: get back to the program   -     Comment  Casey Peters is following the keto diet.  His wife does most of the shopping and she does check  food labels.  They try to eat as much fresh food as possible.  He watches his salt intake closely as well his fluid intake.  They are trying to stick to leaner meats.  They eat a lot of hamburger steak.  We talked about making sure they are getting the lower fat version. He was set on following keto and eating fat despite his heart.   A1c 6.5, pt mentioned he had gained some weight. Did not want to make goals or talk about nutrition, pt was frustrated that the cardiac rehab is still closed. I offered [facility] again to him to see if he was interested, pt declined. Pt expressed frustration on his progress and wants to go to the in-person rehab.   Pt deferred nutrition (does not want to make any changes)     Expected Outcome  Short: Try for lower saturated fats and wath fluid intake.  Long: Continue to eat fresh foods.   Try for lower saturated fats  and wath fluid intake. Continue to eat fresh foods.   -        Nutrition Goals Discharge (Final Nutrition Goals Re-Evaluation): Nutrition Goals Re-Evaluation - 04/17/19 1028      Goals   Comment  Pt deferred nutrition (does not want to make any changes)       Psychosocial: Target Goals: Acknowledge presence or absence of significant depression and/or stress, maximize coping skills, provide positive support system. Participant is able to verbalize types and ability to use techniques and skills needed for reducing stress and depression.   Initial Review & Psychosocial Screening: Initial Psych Review & Screening - 11/10/18 1511      Initial Review   Current issues with  None Identified      Family Dynamics   Good Support System?  Yes      Barriers   Psychosocial barriers to participate in program  The patient should benefit from training in stress management and relaxation.      Screening Interventions   Interventions  Encouraged to exercise;Program counselor consult;To provide support and resources with identified psychosocial needs;Provide feedback about the scores to participant    Expected Outcomes  Short Term goal: Utilizing psychosocial counselor, staff and physician to assist with identification of specific Stressors or current issues interfering with healing process. Setting desired goal for each stressor or current issue identified.;Long Term Goal: Stressors or current issues are controlled or eliminated.;Short Term goal: Identification and review with participant of any Quality of Life or Depression concerns found by scoring the questionnaire.;Long Term goal: The participant improves quality of Life and PHQ9 Scores as seen by post scores and/or verbalization of changes       Quality of Life Scores:  Quality of Life - 04/13/19 1123      Quality of Life Scores   Health/Function Pre  18.83 %    Health/Function Post  21.73 %    Health/Function % Change  15.4 %     Socioeconomic Pre  28 %    Socioeconomic Post  25.33 %    Socioeconomic % Change   -9.54 %    Psych/Spiritual Pre  23.79 %    Psych/Spiritual Post  28.57 %    Psych/Spiritual % Change  20.09 %    Family Pre  25.5 %    Family Post  22.8 %    Family % Change  -10.59 %    GLOBAL Pre  22.56 %    GLOBAL Post  24 %  GLOBAL % Change  6.38 %      Scores of 19 and below usually indicate a poorer quality of life in these areas.  A difference of  2-3 points is a clinically meaningful difference.  A difference of 2-3 points in the total score of the Quality of Life Index has been associated with significant improvement in overall quality of life, self-image, physical symptoms, and general health in studies assessing change in quality of life.  PHQ-9: Recent Review Flowsheet Data    Depression screen [facility] 2/9 11/10/2018   Decreased Interest 0   Down, Depressed, Hopeless 0   PHQ - 2 Score 0   Altered sleeping 0   Tired, decreased energy 1   Change in appetite 0   Feeling bad or failure about yourself  0   Trouble concentrating 2   Moving slowly or fidgety/restless 0   Suicidal thoughts 0   PHQ-9 Score 3   Difficult doing work/chores Not difficult at all     Interpretation of Total Score  Total Score Depression Severity:  1-4 = Minimal depression, 5-9 = Mild depression, 10-14 = Moderate depression, 15-19 = Moderately severe depression, 20-27 = Severe depression   Psychosocial Evaluation and Intervention: Psychosocial Evaluation - 11/21/18 1035      Psychosocial Evaluation & Interventions   Interventions  Encouraged to exercise with the program and follow exercise prescription    Comments  Counselor met with Mr. [facility]) today for initial psychosocial evaluation. He is a 73 year old who has a history of CHG.  He has a strong support system with a spouse of 42 years and (4) adult children.  Other than his heart condition, he struggles with Diabetes.  Casey Peters reports sleeping well with  approximately 8 hours per night; and a good appetite.  He denies a history of depression or anxiety or any current symptoms and is typically in a positive mood.  He states his health is his primary stressor.  Casey Peters has goals to walk better and improve his energy level.  He would love to have fewer dietary restrictions with salt intake.  He will be followed by staff.       Expected Outcomes  Short:  Casey Peters will exercise consistently to improve his overall strength and energy.  Long:  Casey Peters will develop a healthy lifestyle routine for his health and mental health.     Continue Psychosocial Services   Follow up required by staff       Psychosocial Re-Evaluation: Psychosocial Re-Evaluation    Row Name 12/29/18 1015 01/27/19 1126 02/21/19 1139 03/15/19 1158 04/17/19 0917     Psychosocial Re-Evaluation   Current issues with  None Identified  None Identified  Current Stress Concerns  Current Stress Concerns  Current Stress Concerns   Comments  counselor Learta Codding documenting in [place] session: counselor reached out to patient.  He has no concerns to report; sleeping and eating well.  He intends to maintain goals set at programs beginning: walk better and increase energy.  Patient expressed that he misses salt in his diet.  Says he goes across the hallway to work out.  Casey Peters is doing well at home.  He is walking and continues to improve.  He has tried to push himself and had a few days where he felt like he had overdone it.  He was encouraged to continue to walk and build stamina.  He continues to sleep well and cope with being home.  No major concens  Casey Peters is doing well.  He has been walking and feeling better.  He had an echo last week and was discouraged by that he was not further improved.  He was up to 30% now versus the 20% a year ago.  He has also started [facility] and seems to be doing well with it.  We talked about how he has improved and if he keeps working, he may continue to see improvement.   Casey Peters is doing  okay.  He is discouraged that he is still feeling week.  He did not tolerate the Entresto well and has stopped taking it.  I encouraged him to talk to his doctor if he does not start feeling better.   Casey Peters is starting to feel better now after his spell a few months ago.  He is glad to be back in class and will be graduating soon.  Sleep is staying the same. Overall, he is doing well.He would like to get back to going on crusing again but knows that it will be next year.   Expected Outcomes  Short: continue exercising; long: continues to seek options for exercising outside of program structure  Short: Continue to exercise to build stamina.  Long: Continue to stay postivie.   Short: Continue to exercise for stamina.  Long: Stay positive.   Short: Continue to exercise for stamina.  Long: Stay positive.   Short: Continue to exercise for stamina.  Long: Stay positive.    Interventions  -  Encouraged to attend Cardiac Rehabilitation for the exercise  Encouraged to attend Cardiac Rehabilitation for the exercise  Encouraged to attend Cardiac Rehabilitation for the exercise  -   Continue Psychosocial Services   Follow up required by staff  Follow up required by staff  Follow up required by staff  Follow up required by staff  -      Psychosocial Discharge (Final Psychosocial Re-Evaluation): Psychosocial Re-Evaluation - 04/17/19 0917      Psychosocial Re-Evaluation   Current issues with  Current Stress Concerns    Comments  Casey Peters is starting to feel better now after his spell a few months ago.  He is glad to be back in class and will be graduating soon.  Sleep is staying the same. Overall, he is doing well.He would like to get back to going on crusing again but knows that it will be next year.    Expected Outcomes  Short: Continue to exercise for stamina.  Long: Stay positive.        Vocational Rehabilitation: Provide vocational rehab assistance to qualifying candidates.   Vocational Rehab Evaluation &  Intervention: Vocational Rehab - 11/10/18 1525      Initial Vocational Rehab Evaluation & Intervention   Assessment shows need for Vocational Rehabilitation  No       Education: Education Goals: Education classes will be provided on a variety of topics geared toward better understanding of heart health and risk factor modification. Participant will state understanding/return demonstration of topics presented as noted by education test scores.  Learning Barriers/Preferences: Learning Barriers/Preferences - 11/10/18 1522      Learning Barriers/Preferences   Learning Barriers  None    Learning Preferences  Individual Instruction       Education Topics:  AED/CPR: - Group verbal and written instruction with the use of models to demonstrate the basic use of the AED with the basic ABC's of resuscitation.   Cardiac Rehab from 12/21/2018 in [facility] Cardiac and Pulmonary Rehab  Date  12/05/18  Educator  SB  Instruction Review Code  1- Verbalizes Understanding      General Nutrition Guidelines/Fats and Fiber: -Group instruction provided by verbal, written material, models and posters to present the general guidelines for heart healthy nutrition. Gives an explanation and review of dietary fats and fiber.   Cardiac Rehab from 12/21/2018 in [facility] Cardiac and Pulmonary Rehab  Date  12/19/18  Educator  [facility]  Instruction Review Code  1- Verbalizes Understanding      Controlling Sodium/Reading Food Labels: -Group verbal and written material supporting the discussion of sodium use in heart healthy nutrition. Review and explanation with models, verbal and written materials for utilization of the food label.   Cardiac Rehab from 12/21/2018 in [facility] Cardiac and Pulmonary Rehab  Date  12/21/18  Educator  [facility]  Instruction Review Code  1- Verbalizes Understanding      Exercise Physiology & General Exercise Guidelines: - Group verbal and written instruction with models to review the exercise physiology  of the cardiovascular system and associated critical values. Provides general exercise guidelines with specific guidelines to those with heart or lung disease.    Aerobic Exercise & Resistance Training: - Gives group verbal and written instruction on the various components of exercise. Focuses on aerobic and resistive training programs and the benefits of this training and how to safely progress through these programs..   Flexibility, Balance, Mind/Body Relaxation: Provides group verbal/written instruction on the benefits of flexibility and balance training, including mind/body exercise modes such as yoga, pilates and tai chi.  Demonstration and skill practice provided.   Stress and Anxiety: - Provides group verbal and written instruction about the health risks of elevated stress and causes of high stress.  Discuss the correlation between heart/lung disease and anxiety and treatment options. Review healthy ways to manage with stress and anxiety.   Cardiac Rehab from 12/21/2018 in [facility] Cardiac and Pulmonary Rehab  Date  11/30/18  Educator  [facility]  Instruction Review Code  1- Verbalizes Understanding      Depression: - Provides group verbal and written instruction on the correlation between heart/lung disease and depressed mood, treatment options, and the stigmas associated with seeking treatment.   Cardiac Rehab from 12/21/2018 in [facility] Cardiac and Pulmonary Rehab  Date  12/14/18  Educator  [facility]  Instruction Review Code  1- Verbalizes Understanding      Anatomy & Physiology of the Heart: - Group verbal and written instruction and models provide basic cardiac anatomy and physiology, with the coronary electrical and arterial systems. Review of Valvular disease and Heart Failure   Cardiac Procedures: - Group verbal and written instruction to review commonly prescribed medications for heart disease. Reviews the medication, class of the drug, and side effects. Includes the steps to properly store  meds and maintain the prescription regimen. (beta blockers and nitrates)   Cardiac Rehab from 12/21/2018 in [facility] Cardiac and Pulmonary Rehab  Date  11/28/18  Educator  SB  Instruction Review Code  1- Verbalizes Understanding      Cardiac Medications I: - Group verbal and written instruction to review commonly prescribed medications for heart disease. Reviews the medication, class of the drug, and side effects. Includes the steps to properly store meds and maintain the prescription regimen.   Cardiac Rehab from 12/21/2018 in [facility] Cardiac and Pulmonary Rehab  Date  11/21/18  Educator  SB  Instruction Review Code  1- Verbalizes Understanding      Cardiac Medications II: -Group verbal and written instruction to review  commonly prescribed medications for heart disease. Reviews the medication, class of the drug, and side effects. (all other drug classes)   Cardiac Rehab from 12/21/2018 in [facility] Cardiac and Pulmonary Rehab  Date  12/12/18  Educator  SB  Instruction Review Code  1- Verbalizes Understanding       Go Sex-Intimacy & Heart Disease, Get SMART - Goal Setting: - Group verbal and written instruction through game format to discuss heart disease and the return to sexual intimacy. Provides group verbal and written material to discuss and apply goal setting through the application of the S.M.A.R.T. Method.   Cardiac Rehab from 12/21/2018 in [facility] Cardiac and Pulmonary Rehab  Date  11/28/18  Educator  SB  Instruction Review Code  1- Verbalizes Understanding      Other Matters of the Heart: - Provides group verbal, written materials and models to describe Stable Angina and Peripheral Artery. Includes description of the disease process and treatment options available to the cardiac patient.   Exercise & Equipment Safety: - Individual verbal instruction and demonstration of equipment use and safety with use of the equipment.   Cardiac Rehab from 12/21/2018 in [facility] Cardiac and Pulmonary  Rehab  Date  11/10/18  Educator  [facility]  Instruction Review Code  1- Verbalizes Understanding      Infection Prevention: - Provides verbal and written material to individual with discussion of infection control including proper hand washing and proper equipment cleaning during exercise session.   Cardiac Rehab from 12/21/2018 in [facility] Cardiac and Pulmonary Rehab  Date  11/10/18  Educator  [facility]  Instruction Review Code  1- Verbalizes Understanding      Falls Prevention: - Provides verbal and written material to individual with discussion of falls prevention and safety.   Cardiac Rehab from 12/21/2018 in [facility] Cardiac and Pulmonary Rehab  Date  11/10/18  Educator  [place]  Instruction Review Code  1- Verbalizes Understanding      Diabetes: - Individual verbal and written instruction to review signs/symptoms of diabetes, desired ranges of glucose level fasting, after meals and with exercise. Acknowledge that pre and post exercise glucose checks will be done for 3 sessions at entry of program.   Cardiac Rehab from 12/21/2018 in [facility] Cardiac and Pulmonary Rehab  Date  11/10/18  Educator  [facility]  Instruction Review Code  1- Verbalizes Understanding      Know Your Numbers and Risk Factors: -Group verbal and written instruction about important numbers in your health.  Discussion of what are risk factors and how they play a role in the disease process.  Review of Cholesterol, Blood Pressure, Diabetes, and BMI and the role they play in your overall health.   Cardiac Rehab from 12/21/2018 in [facility] Cardiac and Pulmonary Rehab  Date  12/12/18  Educator  SB  Instruction Review Code  1- Verbalizes Understanding      Sleep Hygiene: -Provides group verbal and written instruction about how sleep can affect your health.  Define sleep hygiene, discuss sleep cycles and impact of sleep habits. Review good sleep hygiene tips.    Cardiac Rehab from 12/21/2018 in [facility] Cardiac and Pulmonary Rehab  Date  11/16/18   Educator  [facility]  Instruction Review Code  1- Verbalizes Understanding      Other: -Provides group and verbal instruction on various topics (see comments)   Knowledge Questionnaire Score: Knowledge Questionnaire Score - 04/13/19 1123      Knowledge Questionnaire Score   Pre Score  20/26    Post  Score  25/26       Core Components/Risk Factors/Patient Goals at Admission: Personal Goals and Risk Factors at Admission - 11/10/18 1526      Core Components/Risk Factors/Patient Goals on Admission    Weight Management  Yes;Weight Loss;Weight Maintenance    Intervention  Weight Management: Develop a combined nutrition and exercise program designed to reach desired caloric intake, while maintaining appropriate intake of nutrient and fiber, sodium and fats, and appropriate energy expenditure required for the weight goal.;Weight Management: Provide education and appropriate resources to help participant work on and attain dietary goals.;Weight Management/Obesity: Establish reasonable short term and long term weight goals.    Admit Weight  209 lb (94.8 kg)    Goal Weight: Short Term  205 lb (93 kg)    Goal Weight: Long Term  200 lb (90.7 kg)    Expected Outcomes  Short Term: Continue to assess and modify interventions until short term weight is achieved;Long Term: Adherence to nutrition and physical activity/exercise program aimed toward attainment of established weight goal;Weight Loss: Understanding of general recommendations for a balanced deficit meal plan, which promotes 1-2 lb weight loss per week and includes a negative energy balance of [phone] kcal/d;Understanding of distribution of calorie intake throughout the day with the consumption of 4-5 meals/snacks    Tobacco Cessation  Yes    Intervention  Assist the participant in steps to quit. Provide individualized education and counseling about committing to Tobacco Cessation, relapse prevention, and pharmacological support that can be provided  by physician.;Advice worker, assist with locating and accessing local/national Quit Smoking programs, and support quit date choice.    Expected Outcomes  Short Term: Will demonstrate readiness to quit, by selecting a quit date.;Short Term: Will quit all tobacco product use, adhering to prevention of relapse plan.;Long Term: Complete abstinence from all tobacco products for at least 12 months from quit date.    Diabetes  Yes    Intervention  Provide education about signs/symptoms and action to take for hypo/hyperglycemia.;Provide education about proper nutrition, including hydration, and aerobic/resistive exercise prescription along with prescribed medications to achieve blood glucose in normal ranges: Fasting glucose 65-99 mg/dL    Expected Outcomes  Short Term: Participant verbalizes understanding of the signs/symptoms and immediate care of hyper/hypoglycemia, proper foot care and importance of medication, aerobic/resistive exercise and nutrition plan for blood glucose control.;Long Term: Attainment of HbA1C < 7%.    Heart Failure  Yes    Intervention  Provide a combined exercise and nutrition program that is supplemented with education, support and counseling about heart failure. Directed toward relieving symptoms such as shortness of breath, decreased exercise tolerance, and extremity edema.    Expected Outcomes  Improve functional capacity of life;Short term: Attendance in program 2-3 days a week with increased exercise capacity. Reported lower sodium intake. Reported increased fruit and vegetable intake. Reports medication compliance.;Short term: Daily weights obtained and reported for increase. Utilizing diuretic protocols set by physician.;Long term: Adoption of self-care skills and reduction of barriers for early signs and symptoms recognition and intervention leading to self-care maintenance.    Hypertension  Yes    Intervention  Provide education on lifestyle modifcations including  regular physical activity/exercise, weight management, moderate sodium restriction and increased consumption of fresh fruit, vegetables, and low fat dairy, alcohol moderation, and smoking cessation.;Monitor prescription use compliance.    Expected Outcomes  Short Term: Continued assessment and intervention until BP is < 140/63m HG in hypertensive participants. < 130/89mHG in hypertensive participants with  diabetes, heart failure or chronic kidney disease.;Long Term: Maintenance of blood pressure at goal levels.    Lipids  Yes    Intervention  Provide education and support for participant on nutrition & aerobic/resistive exercise along with prescribed medications to achieve LDL <45m, HDL >442m    Expected Outcomes  Short Term: Participant states understanding of desired cholesterol values and is compliant with medications prescribed. Participant is following exercise prescription and nutrition guidelines.;Long Term: Cholesterol controlled with medications as prescribed, with individualized exercise RX and with personalized nutrition plan. Value goals: LDL < 7049mHDL > 40 mg.       Core Components/Risk Factors/Patient Goals Review:  Goals and Risk Factor Review    Row Name 11/25/18 0753 12/29/18 0948 01/10/19 1140 01/27/19 1129 02/21/19 1142     Core Components/Risk Factors/Patient Goals Review   Personal Goals Review  Weight Management/Obesity;Diabetes;Hypertension;Heart Failure;Lipids  Weight Management/Obesity;Diabetes;Hypertension;Heart Failure;Lipids  Weight Management/Obesity;Hypertension;Heart Failure;Diabetes  Weight Management/Obesity;Hypertension;Heart Failure;Diabetes  Weight Management/Obesity;Hypertension;Heart Failure;Diabetes   Review  Casey Peters is off to a good start in rehab. Today, he noted that his heart rate was a little higher today and his blood pressure was lower than normal. He noted that his weight is up a pound as well as he weighs daily.  He does not feeling dizzy or  lightheaded but does not some swelling with the extra weight on board.  We talked about using his extra lasix today to help get back down.  He normally checks his pressures daily at home and they were running steady until today.  Overally, his blood sugars have been doing better and staying steadier.  His last A1c was 6.4.  Normally, his weight is pretty steady and he is still trying to lose some more.   Casey Peters is doing well with his weight and staying down around 202 lbs.  His blood sugars have been doing well and were around 118 this morning.  His blood pressures are doing well and he continues to check them at home; this mornimg he was 118/92 and pulse was 91.  He denies any symptoms of his heart failure and is watching his sodium.  However, he would like to get his salt shaker back.  He is doing well with his medications.   Casey Peters continues to keep an eye on his weight and blood pressures.  He is doing his best to stay on top of his heart failure.  He noted that his heart rate is starting to come back down again as well.  He is eating better and has found this to help all of his numbers as well.  Casey Peters has been doing well at home.  His weight has been steady and he checks it daily.  He is also doing well with his blood pressures.  He did not note any heart failure symptoms.    Casey Peters is doing well.  His weight is up about 5 lbs since being home.  [facility] his [facility] failure and his EF is now up to 30%!  His pressures have been doing well and he feels pretty good overall!!.    Expected Outcomes  Short: Continue to keep eye on heart rate and blood pressures. Long: Continue to manage heart failure.   Short: Continue to work on weight loss.  Long; Continue to manage his heart failure.   Short: Continue to work on weight loss through diet and exercise.  Long; Continue to manage his heart failure.   Short: Continue to monitor  weight.  Long: Continue to manage heart failure symptoms.   Short: Continue to  monitor symptoms closely since starting Entresto.  Long: Continue to work on weight loss.    [place] Name 03/15/19 1159 04/17/19 0918           Core Components/Risk Factors/Patient Goals Review   Personal Goals Review  Weight Management/Obesity;Hypertension;Heart Failure;Diabetes  Weight Management/Obesity;Hypertension;Heart Failure;Diabetes      Review  Casey Peters is doing okay.  Weight continues to be stable.  He did not tolerate the Entresto well as it was dropping his blood pressure; he stopped taking it.  He has continued to have feelings of general weakness.  I encouraged him to contact his doctor if he does not start feeling better.   Casey Peters's weight was 210 lbs.  which is close to wear he started the program.  His pressures have been doing good as have his sugars.  He did not tolerate the Entresto well and had to come off.  He denies any further symptoms of heart failure.  He is at EF of 30%.      Expected Outcomes  Short: Call doctor if not improved.  Long: Continue to work on weight loss.   Short: Continue to lose weight again.  Long: Continue to manage heart failure.         Core Components/Risk Factors/Patient Goals at Discharge (Final Review):  Goals and Risk Factor Review - 04/17/19 0918      Core Components/Risk Factors/Patient Goals Review   Personal Goals Review  Weight Management/Obesity;Hypertension;Heart Failure;Diabetes    Review  Casey Peters's weight was 210 lbs.  which is close to wear he started the program.  His pressures have been doing good as have his sugars.  He did not tolerate the Entresto well and had to come off.  He denies any further symptoms of heart failure.  He is at EF of 30%.    Expected Outcomes  Short: Continue to lose weight again.  Long: Continue to manage heart failure.       ITP Comments: ITP Comments    Row Name 11/10/18 1457 12/07/18 0559 12/27/18 1614 01/04/19 1149 04/13/19 0912   ITP Comments  Med Review completed. Initial ITP created, diagnosis can be found in  [facility] encounter 12/16.   30 day review. Continue with ITP unless directed changes by Medical Director chart review.  Our program is currently closed due to COVID-19.  We are communicating with patient via phone calls and emails.    30 day review. Continue with ITP unless directed changes by Medical Director chart review.  Returned to rehab on 04/13/2019   Row Name 04/26/19 1426 04/27/19 0921 05/01/19 0902       ITP Comments  30 day review cycle restarting  after being closed since March 16 because of  Covid 19 pandemic. Program opened to patients on July 6. Not all have returned. ITP updated and sent to Medical Director for review,changes as needed and signature  Casey Peters had some increased ectopy today.  Will send to MD.  Discharge ITP sent and signed by Dr. Sabra Heck.  Discharge Summary routed to PCP and cardiologist.        Comments: Discharge ITP

## 2019-05-01 NOTE — Progress Notes (Signed)
Discharge Progress Report  Patient Details  Name: Casey Peters MRN: [id number] Date of Birth: 08-05-1946 Referring Provider:     Cardiac Rehab from 11/10/2018 in [facility]  Referring Provider  Fath       Number of Visits: 39  Reason for Discharge:  Patient reached a stable level of exercise. Patient independent in their exercise. Patient has met program and personal goals.  Smoking History:  Social History   Tobacco Use  Smoking Status Current Every Day Smoker  . Types: Cigars  Smokeless Tobacco Never Used  Tobacco Comment   2-3 cigars per day    Diagnosis:  No diagnosis found.  ADL UCSD:   Initial Exercise Prescription: Initial Exercise Prescription - 11/10/18 1500      Date of Initial Exercise RX and Referring Provider   Date  11/10/18    Referring Provider  Fath      Treadmill   MPH  2    Grade  0.5    Minutes  15    METs  2.5      Recumbant Bike   Level  3    RPM  60    Watts  22    Minutes  15    METs  2.5      NuStep   Level  2    SPM  80    Minutes  15    METs  2.5      Prescription Details   Frequency (times per week)  3    Duration  Progress to 45 minutes of aerobic exercise without signs/symptoms of physical distress      Intensity   THRR 40-80% of Max Heartrate  117-138    Ratings of Perceived Exertion  11-15    Perceived Dyspnea  0-4      Resistance Training   Training Prescription  Yes    Weight  3 lb    Reps  10-15       Discharge Exercise Prescription (Final Exercise Prescription Changes): Exercise Prescription Changes - 12/27/18 1600      Response to Exercise   Blood Pressure (Admit)  124/56    Blood Pressure (Exercise)  128/70    Blood Pressure (Exit)  126/74    Heart Rate (Admit)  93 bpm    Heart Rate (Exercise)  113 bpm    Heart Rate (Exit)  98 bpm    Rating of Perceived Exertion (Exercise)  14    Symptoms  none    Duration  Continue with 45 min of aerobic exercise without  signs/symptoms of physical distress.    Intensity  THRR unchanged      Progression   Progression  Continue to progress workloads to maintain intensity without signs/symptoms of physical distress.    Average METs  3.41      Resistance Training   Training Prescription  Yes    Weight  3 lbs    Reps  10-15      Interval Training   Interval Training  No      Treadmill   MPH  2.5    Grade  1.5    Minutes  15    METs  3.34      Recumbant Bike   Level  3    Minutes  15    METs  2.74      NuStep   Level  5    Minutes  15    METs  4.1  Home Exercise Plan   Plans to continue exercise at  Home (comment)   walking   Frequency  Add 2 additional days to program exercise sessions.    Initial Home Exercises Provided  11/25/18       Functional Capacity: 6 Minute Walk    Row Name 11/10/18 1457 04/24/19 0926       6 Minute Walk   Phase  Initial  Discharge    Distance  1270 feet  1450 feet    Distance % Change  -  14.17 %    Distance Feet Change  -  1270 ft    Walk Time  6 minutes  6 minutes    # of Rest Breaks  0  0    MPH  2.4  2.74    METS  2.7  2.72    RPE  9  9    VO2 Peak  9.5  9.54    Symptoms  No  No    Resting HR  96 bpm  86 bpm    Resting BP  98/68  104/62    Resting Oxygen Saturation   96 %  -    Exercise Oxygen Saturation  during 6 min walk  98 %  -    Max Ex. HR  111 bpm  99 bpm    Max Ex. BP  114/68  98/62    2 Minute Post BP  124/64  -       Psychological, QOL, Others - Outcomes: PHQ 2/9: Depression screen PHQ 2/9 11/10/2018  Decreased Interest 0  Down, Depressed, Hopeless 0  PHQ - 2 Score 0  Altered sleeping 0  Tired, decreased energy 1  Change in appetite 0  Feeling bad or failure about yourself  0  Trouble concentrating 2  Moving slowly or fidgety/restless 0  Suicidal thoughts 0  PHQ-9 Score 3  Difficult doing work/chores Not difficult at all    Quality of Life: Quality of Life - 04/13/19 1123      Quality of Life Scores    Health/Function Pre  18.83 %    Health/Function Post  21.73 %    Health/Function % Change  15.4 %    Socioeconomic Pre  28 %    Socioeconomic Post  25.33 %    Socioeconomic % Change   -9.54 %    Psych/Spiritual Pre  23.79 %    Psych/Spiritual Post  28.57 %    Psych/Spiritual % Change  20.09 %    Family Pre  25.5 %    Family Post  22.8 %    Family % Change  -10.59 %    GLOBAL Pre  22.56 %    GLOBAL Post  24 %    GLOBAL % Change  6.38 %       Personal Goals: Goals established at orientation with interventions provided to work toward goal. Personal Goals and Risk Factors at Admission - 11/10/18 1526      Core Components/Risk Factors/Patient Goals on Admission    Weight Management  Yes;Weight Loss;Weight Maintenance    Intervention  Weight Management: Develop a combined nutrition and exercise program designed to reach desired caloric intake, while maintaining appropriate intake of nutrient and fiber, sodium and fats, and appropriate energy expenditure required for the weight goal.;Weight Management: Provide education and appropriate resources to help participant work on and attain dietary goals.;Weight Management/Obesity: Establish reasonable short term and long term weight goals.    Admit Weight  209 lb (  94.8 kg)    Goal Weight: Short Term  205 lb (93 kg)    Goal Weight: Long Term  200 lb (90.7 kg)    Expected Outcomes  Short Term: Continue to assess and modify interventions until short term weight is achieved;Long Term: Adherence to nutrition and physical activity/exercise program aimed toward attainment of established weight goal;Weight Loss: Understanding of general recommendations for a balanced deficit meal plan, which promotes 1-2 lb weight loss per week and includes a negative energy balance of [phone] kcal/d;Understanding of distribution of calorie intake throughout the day with the consumption of 4-5 meals/snacks    Tobacco Cessation  Yes    Intervention  Assist the participant  in steps to quit. Provide individualized education and counseling about committing to Tobacco Cessation, relapse prevention, and pharmacological support that can be provided by physician.;Advice worker, assist with locating and accessing local/national Quit Smoking programs, and support quit date choice.    Expected Outcomes  Short Term: Will demonstrate readiness to quit, by selecting a quit date.;Short Term: Will quit all tobacco product use, adhering to prevention of relapse plan.;Long Term: Complete abstinence from all tobacco products for at least 12 months from quit date.    Diabetes  Yes    Intervention  Provide education about signs/symptoms and action to take for hypo/hyperglycemia.;Provide education about proper nutrition, including hydration, and aerobic/resistive exercise prescription along with prescribed medications to achieve blood glucose in normal ranges: Fasting glucose 65-99 mg/dL    Expected Outcomes  Short Term: Participant verbalizes understanding of the signs/symptoms and immediate care of hyper/hypoglycemia, proper foot care and importance of medication, aerobic/resistive exercise and nutrition plan for blood glucose control.;Long Term: Attainment of HbA1C < 7%.    Heart Failure  Yes    Intervention  Provide a combined exercise and nutrition program that is supplemented with education, support and counseling about heart failure. Directed toward relieving symptoms such as shortness of breath, decreased exercise tolerance, and extremity edema.    Expected Outcomes  Improve functional capacity of life;Short term: Attendance in program 2-3 days a week with increased exercise capacity. Reported lower sodium intake. Reported increased fruit and vegetable intake. Reports medication compliance.;Short term: Daily weights obtained and reported for increase. Utilizing diuretic protocols set by physician.;Long term: Adoption of self-care skills and reduction of barriers for early  signs and symptoms recognition and intervention leading to self-care maintenance.    Hypertension  Yes    Intervention  Provide education on lifestyle modifcations including regular physical activity/exercise, weight management, moderate sodium restriction and increased consumption of fresh fruit, vegetables, and low fat dairy, alcohol moderation, and smoking cessation.;Monitor prescription use compliance.    Expected Outcomes  Short Term: Continued assessment and intervention until BP is < 140/88m HG in hypertensive participants. < 130/855mHG in hypertensive participants with diabetes, heart failure or chronic kidney disease.;Long Term: Maintenance of blood pressure at goal levels.    Lipids  Yes    Intervention  Provide education and support for participant on nutrition & aerobic/resistive exercise along with prescribed medications to achieve LDL <7028mHDL >38m42m  Expected Outcomes  Short Term: Participant states understanding of desired cholesterol values and is compliant with medications prescribed. Participant is following exercise prescription and nutrition guidelines.;Long Term: Cholesterol controlled with medications as prescribed, with individualized exercise RX and with personalized nutrition plan. Value goals: LDL < 70mg35mL > 40 mg.        Personal Goals Discharge: Goals and Risk Factor Review  [place] Name 11/25/18 0753 12/29/18 0948 01/10/19 1140 01/27/19 1129 02/21/19 1142     Core Components/Risk Factors/Patient Goals Review   Personal Goals Review  Weight Management/Obesity;Diabetes;Hypertension;Heart Failure;Lipids  Weight Management/Obesity;Diabetes;Hypertension;Heart Failure;Lipids  Weight Management/Obesity;Hypertension;Heart Failure;Diabetes  Weight Management/Obesity;Hypertension;Heart Failure;Diabetes  Weight Management/Obesity;Hypertension;Heart Failure;Diabetes   Review  Casey Peters is off to a good start in rehab. Today, he noted that his heart rate was a little higher today  and his blood pressure was lower than normal. He noted that his weight is up a pound as well as he weighs daily.  He does not feeling dizzy or lightheaded but does not some swelling with the extra weight on board.  We talked about using his extra lasix today to help get back down.  He normally checks his pressures daily at home and they were running steady until today.  Overally, his blood sugars have been doing better and staying steadier.  His last A1c was 6.4.  Normally, his weight is pretty steady and he is still trying to lose some more.   Casey Peters is doing well with his weight and staying down around 202 lbs.  His blood sugars have been doing well and were around 118 this morning.  His blood pressures are doing well and he continues to check them at home; this mornimg he was 118/92 and pulse was 91.  He denies any symptoms of his heart failure and is watching his sodium.  However, he would like to get his salt shaker back.  He is doing well with his medications.   Casey Peters continues to keep an eye on his weight and blood pressures.  He is doing his best to stay on top of his heart failure.  He noted that his heart rate is starting to come back down again as well.  He is eating better and has found this to help all of his numbers as well.  Casey Peters has been doing well at home.  His weight has been steady and he checks it daily.  He is also doing well with his blood pressures.  He did not note any heart failure symptoms.    Casey Peters is doing well.  His weight is up about 5 lbs since being home.  He has started [facility] now for his heart failure and his EF is now up to 30%!  His pressures have been doing well and he feels pretty good overall!!.    Expected Outcomes  Short: Continue to keep eye on heart rate and blood pressures. Long: Continue to manage heart failure.   Short: Continue to work on weight loss.  Long; Continue to manage his heart failure.   Short: Continue to work on weight loss through diet and exercise.  Long;  Continue to manage his heart failure.   Short: Continue to monitor weight.  Long: Continue to manage heart failure symptoms.   Short: Continue to monitor symptoms closely since starting Entresto.  Long: Continue to work on weight loss.    [place] Name 03/15/19 1159 04/17/19 0918           Core Components/Risk Factors/Patient Goals Review   Personal Goals Review  Weight Management/Obesity;Hypertension;Heart Failure;Diabetes  Weight Management/Obesity;Hypertension;Heart Failure;Diabetes      Review  Casey Peters is doing okay.  Weight continues to be stable.  He did not tolerate the Entresto well as it was dropping his blood pressure; he stopped taking it.  He has continued to have feelings of general weakness.  I encouraged him to contact his doctor if  he does not start feeling better.   Casey Peters's weight was 210 lbs.  which is close to wear he started the program.  His pressures have been doing good as have his sugars.  He did not tolerate the Entresto well and had to come off.  He denies any further symptoms of heart failure.  He is at EF of 30%.      Expected Outcomes  Short: Call doctor if not improved.  Long: Continue to work on weight loss.   Short: Continue to lose weight again.  Long: Continue to manage heart failure.         Exercise Goals and Review: Exercise Goals    Row Name 11/10/18 1457             Exercise Goals   Increase Physical Activity  Yes       Intervention  Provide advice, education, support and counseling about physical activity/exercise needs.;Develop an individualized exercise prescription for aerobic and resistive training based on initial evaluation findings, risk stratification, comorbidities and participant's personal goals.       Expected Outcomes  Short Term: Attend rehab on a regular basis to increase amount of physical activity.;Long Term: Add in home exercise to make exercise part of routine and to increase amount of physical activity.;Long Term: Exercising regularly at least  3-5 days a week.       Increase Strength and Stamina  Yes       Intervention  Provide advice, education, support and counseling about physical activity/exercise needs.;Develop an individualized exercise prescription for aerobic and resistive training based on initial evaluation findings, risk stratification, comorbidities and participant's personal goals.       Expected Outcomes  Short Term: Increase workloads from initial exercise prescription for resistance, speed, and METs.;Short Term: Perform resistance training exercises routinely during rehab and add in resistance training at home;Long Term: Improve cardiorespiratory fitness, muscular endurance and strength as measured by increased METs and functional capacity (6MWT)       Able to understand and use rate of perceived exertion (RPE) scale  Yes       Intervention  Provide education and explanation on how to use RPE scale       Expected Outcomes  Short Term: Able to use RPE daily in rehab to express subjective intensity level;Long Term:  Able to use RPE to guide intensity level when exercising independently       Able to understand and use Dyspnea scale  Yes       Intervention  Provide education and explanation on how to use Dyspnea scale       Expected Outcomes  Short Term: Able to use Dyspnea scale daily in rehab to express subjective sense of shortness of breath during exertion;Long Term: Able to use Dyspnea scale to guide intensity level when exercising independently       Knowledge and understanding of Target Heart Rate Range (THRR)  Yes       Intervention  Provide education and explanation of THRR including how the numbers were predicted and where they are located for reference       Expected Outcomes  Short Term: Able to state/look up THRR;Short Term: Able to use daily as guideline for intensity in rehab;Long Term: Able to use THRR to govern intensity when exercising independently       Able to check pulse independently  Yes        Intervention  Provide education and demonstration on how to check pulse in carotid  and radial arteries.;Review the importance of being able to check your own pulse for safety during independent exercise       Expected Outcomes  Short Term: Able to explain why pulse checking is important during independent exercise;Long Term: Able to check pulse independently and accurately       Understanding of Exercise Prescription  Yes       Intervention  Provide education, explanation, and written materials on patient's individual exercise prescription       Expected Outcomes  Short Term: Able to explain program exercise prescription;Long Term: Able to explain home exercise prescription to exercise independently          Exercise Goals Re-Evaluation: Exercise Goals Re-Evaluation    Row Name 11/16/18 0825 11/25/18 0809 12/01/18 0827 12/14/18 1130 12/27/18 1614     Exercise Goal Re-Evaluation   Exercise Goals Review  Able to understand and use rate of perceived exertion (RPE) scale;Knowledge and understanding of Target Heart Rate Range (THRR);Understanding of Exercise Prescription  Increase Physical Activity;Increase Strength and Stamina;Able to understand and use rate of perceived exertion (RPE) scale;Knowledge and understanding of Target Heart Rate Range (THRR);Understanding of Exercise Prescription;Able to check pulse independently  Increase Physical Activity;Increase Strength and Stamina;Understanding of Exercise Prescription  Increase Physical Activity;Increase Strength and Stamina;Understanding of Exercise Prescription  Increase Physical Activity;Increase Strength and Stamina;Understanding of Exercise Prescription   Comments  Reviewed RPE scale, THR and program prescription with pt today.  Pt voiced understanding and was given a copy of goals to take home.   Casey Peters is off to a good start in rehab.  He struggled his first couple of days and needed a nap after class.  This week he has been able to go and go without  his nap.  Reviewed home exercise with pt today.  Pt plans to walking at home for exercise.   He has a 0.6 mile loop that he walks.  We talked about increasing his time to 30 min.  Reviewed THR, pulse, RPE, sign and symptoms, NTG use, and when to call 911 or MD.  Also discussed weather considerations and indoor options.  Pt voiced understanding.  Casey Peters is doing well in rehab. He has already started to add in his home exercise and trying to increase to two laps at home without getting too tired.  He is up to 34 watts on the bike.  We will continue to monitor his progress.   Casey Peters has been doing well in rehab.  He continues to make improvements and is now up to 2.1 mph and 1.5% on treadmill.  We will continue to monitor his progress.   Casey Peters continues to do well in rehab.  He is walking at home.  We will continue to encourage him to keep moving at home and will continue to monitor his progress at home.    Expected Outcomes  Short: Use RPE daily to regulate intensity. Long: Follow program prescription in THR.  Short: Start to increase walking time to 30 min.  Long: Continue to increase strength and stamina.   Short: Continue to increase workloads. Long: Continue to increase strength and stamina.   Short: Continue to increase workloads.  Long: Continue to increase stamina.  Short: Continue to walk at home. Long: Continue to increase strength and stamina.    [place] Name 12/29/18 7829 01/10/19 1132 01/27/19 1124 02/21/19 1136 03/15/19 1156     Exercise Goal Re-Evaluation   Exercise Goals Review  Increase Physical Activity;Increase Strength and Stamina;Understanding of Exercise Prescription  Increase Physical Activity;Increase Strength and Stamina;Understanding of Exercise Prescription  Increase Physical Activity;Increase Strength and Stamina;Understanding of Exercise Prescription  Increase Physical Activity;Increase Strength and Stamina;Understanding of Exercise Prescription  Increase Physical Activity;Increase Strength and  Stamina;Understanding of Exercise Prescription   Comments  Casey Peters is doing well at home.  He is up to 2-3 laps on his track at home, which took about 40-45 min.  He is starting to get back his strength and stamina but he wishes it would go faster.  He was added to the email list serve today.   Casey Peters continues to walk at home. He now up to 3 laps consistently every other day.  It is now taking him between 41-43 minutes each day.  He is getting faster each week!  He has not been able to get our email yet.  Verified his email address was right, so I encouraged them to check their junk box.    Casey Peters has been walking at home.  Some days he is able to do a little more than his three laps.  Last weekend, he got close to 4 laps, but then felt like he over did it that day.  I encouraged him to continue to build slowly and continue to work on his stamina.   Casey Peters continues to walk at home.  He and his wife are doing 2.5 miles every other day.  He is doing well with it, and no longer feeling exhausted afterwards.  He said that he feels that his stamina has improved.    Casey Peters has been doing well.  The last two week, he hasn't been able to get out to walk as much due to the rain. He is hoping to get back into his routine again this week.  He continues to have some weak feelings.  He wishes his recovery would be faster.  At his last check, his EF had improved but he still feels weak.  I encouraged him to call his cardiologist if these feelings of weakness continue.    Expected Outcomes  Short: Continue to walk track at home and try videos.  Long: Continue to build strength and stamina.   Short: Continue to walk at least three to four times a week. Long: Continue to maintain physcial activity.   Short: Continue to walk.  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   Short: Continue to walk. Long: Continue to increase stamina.   Short: Call doctor if weakness does not go away.  Long: Continue to work stamina.    [place] Name 04/17/19 0915              Exercise Goal Re-Evaluation   Exercise Goals Review  Increase Physical Activity;Increase Strength and Stamina;Understanding of Exercise Prescription       Comments  Casey Peters is happy to be back in rehab.  He will be doing his post 6MWT soon and we expect him to improve.  He had a spell while out and had to lay down in shop.  He went to see his doctor and they could not find anything.  He is starting to feel better now.  He is planning to continue to walk after graduation.       Expected Outcomes  Short: Improve post 6MWT.  Long: Continue to walk independently.          Nutrition & Weight - Outcomes: Pre Biometrics - 04/24/19 0949      Pre Biometrics   Height  5' 11" (1.803 m)  Weight  211 lb (95.7 kg)    BMI (Calculated)  29.44    Single Leg Stand  4.47 seconds        Nutrition: Nutrition Therapy & Goals - 12/29/18 0946      Nutrition Therapy   Diet  Diabetic, low sodium, heart healthy keto diet (pt request for diabetes control) with fluid restriction <1L per MD ~1800kcal (waist 42.2inches!; ref <40inches)(BMI 29)    Protein (specify units)  75-80g    Fiber  30 grams    Whole Grain Foods  1 servings   Keto diet, avoids grains, recommend to still get at least 1 serving a day   Saturated Fats  12 max. grams   <12-16g   Fruits and Vegetables  8 servings/day    Sodium  1.5 grams      Personal Nutrition Goals   Nutrition Goal  Continue with previous goals. ST: get back to the program LT: HH, low Na, lean proteins (but will still have hamburgers, reiterated limiting staurated fats)    Comments  Discussed the importance of quality healthy fats for heart health in addition to Na which he and his wife have been following; discussed what healthy fats were vs unhealthy fats and relayed recommendations such as saturated fat <7% of kcal or <12-16g/day. BG under control while at home due to keto diet. Pt not very willing to share much information, passed phone to wife for most of  the conversation. Wife reports his Na is under 2g/day and his fluid is between 1-2L. Wife reports eating low sodium, low CHO, but cholesterol has been on the high side of normal.       Intervention Plan   Intervention  Prescribe, educate and counsel regarding individualized specific dietary modifications aiming towards targeted core components such as weight, hypertension, lipid management, diabetes, heart failure and other comorbidities.    Expected Outcomes  Short Term Goal: Understand basic principles of dietary content, such as calories, fat, sodium, cholesterol and nutrients.;Long Term Goal: Adherence to prescribed nutrition plan.       Nutrition Discharge: Nutrition Assessments - 11/10/18 1520      MEDFICTS Scores   Pre Score  42       Education Questionnaire Score: Knowledge Questionnaire Score - 04/13/19 1123      Knowledge Questionnaire Score   Pre Score  20/26    Post Score  25/26       Goals reviewed with patient; copy given to patient.

## 2019-06-15 ENCOUNTER — Other Ambulatory Visit: Payer: Self-pay | Admitting: Internal Medicine

## 2019-06-15 DIAGNOSIS — R591 Generalized enlarged lymph nodes: Secondary | ICD-10-CM

## 2019-06-29 DIAGNOSIS — M79646 Pain in unspecified finger(s): Secondary | ICD-10-CM | POA: Insufficient documentation

## 2019-06-30 ENCOUNTER — Other Ambulatory Visit: Payer: Self-pay

## 2019-06-30 ENCOUNTER — Ambulatory Visit
Admission: RE | Admit: 2019-06-30 | Discharge: 2019-06-30 | Disposition: A | Discharge status: Home / Self Care | Payer: Medicare Other | Source: Ambulatory Visit | Attending: Internal Medicine | Admitting: Internal Medicine

## 2019-06-30 DIAGNOSIS — R591 Generalized enlarged lymph nodes: Secondary | ICD-10-CM | POA: Diagnosis not present

## 2019-06-30 LAB — POCT I-STAT CREATININE: Creatinine, Ser: 1.3 mg/dL — ABNORMAL HIGH (ref 0.61–1.24)

## 2019-06-30 MED ORDER — IOHEXOL 300 MG/ML  SOLN
75.0000 mL | Freq: Once | INTRAMUSCULAR | Status: AC | PRN
  Administered 2019-06-30: 75 mL via INTRAVENOUS

## 2019-09-13 ENCOUNTER — Ambulatory Visit: Payer: Medicare Other | Admitting: Urology

## 2019-09-13 ENCOUNTER — Other Ambulatory Visit: Payer: Self-pay

## 2019-09-13 ENCOUNTER — Encounter: Payer: Self-pay | Admitting: Urology

## 2019-09-13 VITALS — BP 105/72 | HR 105 | Ht 71.0 in | Wt 209.0 lb

## 2019-09-13 DIAGNOSIS — N138 Other obstructive and reflux uropathy: Secondary | ICD-10-CM | POA: Diagnosis not present

## 2019-09-13 DIAGNOSIS — Q6432 Congenital stricture of urethra: Secondary | ICD-10-CM

## 2019-09-13 DIAGNOSIS — N401 Enlarged prostate with lower urinary tract symptoms: Secondary | ICD-10-CM

## 2019-09-13 LAB — URINALYSIS, COMPLETE
Bilirubin, UA: NEGATIVE
Glucose, UA: NEGATIVE
Ketones, UA: NEGATIVE
Leukocytes,UA: NEGATIVE
Nitrite, UA: NEGATIVE
Protein,UA: NEGATIVE
Specific Gravity, UA: 1.02 (ref 1.005–1.030)
Urobilinogen, Ur: 0.2 mg/dL (ref 0.2–1.0)
pH, UA: 5 (ref 5.0–7.5)

## 2019-09-13 LAB — MICROSCOPIC EXAMINATION
Bacteria, UA: NONE SEEN
RBC, Urine: NONE SEEN /hpf (ref 0–2)

## 2019-09-13 LAB — BLADDER SCAN AMB NON-IMAGING

## 2019-09-13 MED ORDER — SULFAMETHOXAZOLE-TRIMETHOPRIM 800-160 MG PO TABS: 1.0000 | ORAL_TABLET | Freq: Two times a day (BID) | ORAL | 0 refills | Status: DC

## 2019-09-13 NOTE — Progress Notes (Signed)
09/13/2019 10:29 AM   Casey Peters 10-23-1945 [id number]  Referring provider: Gracelyn Nurse, MD [address] [place],  [place] [address]  Chief Complaint  Patient presents with  . Benign Prostatic Hypertrophy    HPI: F/u -   1) urethral stricture -  He had gross hematuria after the cystoscopy Nov 2016but his LUTS improved. 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 CT was done Oct 2016.   2) BPH, LUTS - Prostate was about 65 grams on CT. He has moderate LUTS including nocturia 2-3x. Tried tamsulosin Jun 2018 for weak stream, frequency and urgency without much change.   3) prostate nodule - PSA 1.13 Feb 2016. Prostate biopsy Nov 2016 benign with 42 g prostate (for rising PSA up to 3.1).  His July 2018 PSA was 1. A small nodule was noted on the left prostate Sep 2018. F/u prostate MRI 08/06/2017 was done and benign. Prostate measured 61 g.  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   5) flank pain, dysuria - Mar 2019 - Renal US was benign. Prior CT abd in 2016. No stones.   He returns and has been having a weak stream and straining. Stream is much weaker. He was doing CIC and has "BXO". He cathed three days ago and noted some bleeding. He feels like the stricture is "closing up". PVR is 5 ml. He was treated for proteus UTI Jun 2020. His PSA was 1.17 Apr 2019. He saw Dr. Vonita Moss at [facility] and it sounds like they discussed cutting back proximal to the sx or a perineal urethrostomy. I could not find CE note.    PMH: Past Medical History:  Diagnosis Date  . BP (high blood pressure) 06/08/2014  . CHF (congestive heart failure) (HCC)   . Constipation   . Diabetes (HCC)   . HLD (hyperlipidemia)   . HTN (hypertension)   . Rhinitis     Surgical History: Past Surgical History:  Procedure Laterality Date  . HERNIA REPAIR  1998  . PILONIDAL CYST EXCISION    . TONSILLECTOMY    . URETHRA SURGERY  1995    Home  Medications:  Allergies as of 09/13/2019   No Known Allergies     Medication List       Accurate as of September 13, 2019 10:29 AM. If you have any questions, ask your nurse or doctor.        aspirin EC 81 MG tablet Take 81 mg by mouth daily.   carvedilol 3.125 MG tablet Commonly known as: COREG Take 3.125 mg by mouth 2 (two) times daily.   furosemide 40 MG tablet Commonly known as: LASIX Take 40 mg by mouth 2 (two) times daily.   metFORMIN 1000 MG tablet Commonly known as: GLUCOPHAGE Take 500 mg by mouth daily with breakfast.   ONE TOUCH ULTRA TEST test strip Generic drug: glucose blood USE 1 STRIP 3 TIMES A DAY TO CHECK BLOOD GLUCOSE LEVELS   spironolactone 25 MG tablet Commonly known as: ALDACTONE Take 25 mg by mouth daily.   sulfamethoxazole-trimethoprim 800-160 MG tablet Commonly known as: BACTRIM DS Take 1 tablet by mouth 2 (two) times daily.   testosterone 50 MG/5GM (1%) Gel Commonly known as: ANDROGEL Apply 5 g topically daily.       Allergies: No Known Allergies  Family History: Family History  Problem Relation Age of Onset  . Hematuria Father   . Kidney disease Neg  Hx   . Prostate cancer Neg Hx     Social History:  reports that he has been smoking cigars. He has never used smokeless tobacco. He reports that he does not drink alcohol or use drugs.  ROS:                                        Physical Exam: There were no vitals taken for this visit.  Constitutional:  Alert and oriented, No acute distress. HEENT: [place] AT, moist mucus membranes.  Trachea midline, no masses. Cardiovascular: No clubbing, cyanosis, or edema. Respiratory: Normal respiratory effort, no increased work of breathing. GI: Abdomen is soft, nontender, nondistended, no abdominal masses GU: No CVA tenderness GU: Penis circumcised, normal foreskin, coronal hypospadias, palpable induration of urethra proximal penis, testicles descended bilaterally and  palpably normal, bilateral epididymis palpably normal, scrotum normal DRE: Prostate 30 g, smooth without hard area or nodule Lymph: No cervical or inguinal lymphadenopathy. Skin: No rashes, bruises or suspicious lesions. Neurologic: Grossly intact, no focal deficits, moving all 4 extremities. Psychiatric: Normal mood and affect.  Laboratory Data: No results found for: WBC, HGB, HCT, MCV, PLT  Lab Results  Component Value Date   CREATININE 1.30 (H) 06/30/2019    No results found for: PSA  No results found for: TESTOSTERONE  No results found for: HGBA1C  Urinalysis    Component Value Date/Time   APPEARANCEUR Cloudy (A) 04/05/2019 1328   GLUCOSEU Negative 04/05/2019 1328   BILIRUBINUR Negative 04/05/2019 1328   PROTEINUR Negative 04/05/2019 1328   NITRITE Negative 04/05/2019 1328   LEUKOCYTESUR 1+ (A) 04/05/2019 1328    Lab Results  Component Value Date   LABMICR See below: 04/05/2019   WBCUA 11-30 (A) 04/05/2019   RBCUA 0-2 11/11/2018   LABEPIT 0-10 04/05/2019   MUCUS Present (A) 12/03/2017   BACTERIA None seen 04/05/2019    Pertinent Imaging: US 2019, CT 2016  No results found for this or any previous visit. No results found for this or any previous visit. No results found for this or any previous visit. No results found for this or any previous visit. Results for orders placed during the hospital encounter of 12/22/17  US RENAL   Narrative CLINICAL DATA:  Pyuria.  EXAM: RENAL / URINARY TRACT ULTRASOUND COMPLETE  COMPARISON:  CT 07/19/2015.  FINDINGS: Right Kidney:  Length: 12.6 cm. Echogenicity is normal. No hydronephrosis. 8 mm parapelvic cyst.  Left Kidney:  Length: 11.8 cm. Echogenicity is normal. No hydronephrosis. 8 mm lateral midportion cyst.  Bladder:  Appears normal for degree of bladder distention.  IMPRESSION: Unremarkable renal ultrasound. Normal renal size. Normal echogenicity. No hydronephrosis. Tiny renal cysts.    Electronically Signed   By: Paulina FusiMark  Shogry M.D.   On: 12/23/2017 08:51    No results found for this or any previous visit. No results found for this or any previous visit. No results found for this or any previous visit.  Assessment & Plan:    1. BPH with obstruction/lower urinary tract symptoms Stable. DRE nl. PSA normal.   2. Urethral sx - we discussed the nature r/b of cysto/dilation, cut back or perineal urethrostomy (referral back to [facility]). He would like to be able to do CIC easier if possible. Plan abx, cysto, sx dilation in office if possible. Will start Bactrim DS a couple of days prior.   - Urinalysis, Complete - Bladder Scan (  Post Void Residual) in office   No follow-ups on file.  Festus Aloe, MD  [facility] [address] [place], [place] [address] [phone]

## 2019-09-13 NOTE — Patient Instructions (Signed)
Urethral Stricture  Urethral stricture is narrowing of the tube (urethra) that carries urine from the bladder out of the body. The urethra can become narrow due to scar tissue from an injury or infection. This can make it difficult to pass urine. In women, the urethra opens above the vaginal opening. In men, the urethra opens at the tip of the penis, and the urethra is much longer than it is in women. Because of the length of the male urethra, urethral stricture is much more common in men. This condition is treated with surgery. What are the causes? In both men and women, common causes of urethral stricture include:  Urinary tract infection (UTI).  Sexually transmitted infection (STI).  Use of a tube placed into the urethra to drain urine from the bladder (urinary catheter).  Urinary tract surgery. In men, common causes of urethral stricture include:  A severe injury to the pelvis.  Prostate surgery.  Injury to the penis. In many cases, the cause of urethral stricture is not known. What increases the risk? You are more likely to develop this condition if you:  Are male. Men who have had prostate surgery are at risk of developing this condition.  Use a urinary catheter.  Have had urinary tract surgery. What are the signs or symptoms? The main symptom of this condition is difficulty passing urine. This may cause decreased urine flow, dribbling, or spraying of urine. Other symptom of this condition may include:  Frequent UTIs.  Blood in the urine.  Pain when urinating.  Swelling of the penis in men.  Inability to pass urine (urinary obstruction). How is this diagnosed? This condition may be diagnosed based on:  Your medical history and a physical exam.  Urine tests to check for infection or bleeding.  X-rays.  Ultrasound.  Retrograde urethrogram. This is a type of test in which dye is injected into the urethra and then an X-ray is taken.  Urethroscopy. This is when  a thin tube with a light and camera on the end (urethroscope) is used to look at the urethra. How is this treated? This condition is treated with surgery. The type of surgery that you have depends on the severity of your condition. You may have:  Urethral dilation. In this procedure, the narrow part of the urethra is stretched open (dilated) with dilating instruments or a small balloon.  Urethrotomy. In this procedure, a urethroscope is placed into the urethra, and the narrow part of the urethra is cut open with a surgical blade inserted through the urethroscope.  Open surgery. In this procedure, an incision is made in the urethra, the narrow part is removed, and the urethra is reconstructed. Follow these instructions at home:   Take over-the-counter and prescription medicines only as told by your health care provider.  If you were prescribed an antibiotic medicine, take it as told by your health care provider. Do not stop taking the antibiotic even if you start to feel better.  Drink enough fluid to keep your urine pale yellow.  Keep all follow-up visits as told by your health care provider. This is important. Contact a health care provider if:  You have signs of a urinary tract infection, such as: ? Frequent urination or passing small amounts of urine frequently. ? Needing to urinate urgently. ? Pain or burning with urination. ? Urine that smells bad or unusual. ? Cloudy urine. ? Pain in the lower abdomen or back. ? Trouble urinating. ? Blood in the urine. ?   Vomiting or being less hungry than normal. ? Diarrhea or abdominal pain. ? Vaginal discharge, if you are male.  Your symptoms are getting worse instead of better. Get help right away if:  You cannot pass urine.  You have a fever.  You have swelling, bruising, or discoloration of your genital area. This includes the penis, scrotum, and inner thighs for men, and the outer genital organs (vulva) and inner thighs for  women.  You develop swelling in your legs.  You have difficulty breathing. Summary  Urethral stricture is narrowing of the tube (urethra) that carries urine from the bladder out of the body. The urethra can become narrow due to scar tissue from an injury or infection.  This condition can make it difficult to pass urine.  This condition is treated with surgery. The type of surgery that you have depends on the severity of your condition.  Contact a health care provider if your symptoms get worse or you have signs of a urinary tract infection. This information is not intended to replace advice given to you by your health care provider. Make sure you discuss any questions you have with your health care provider. Document Released: 10/25/2015 Document Revised: 05/11/2018 Document Reviewed: 05/11/2018 Elsevier Patient Education  2020 Elsevier Inc.   

## 2019-09-27 ENCOUNTER — Encounter: Payer: Self-pay | Admitting: Urology

## 2019-09-27 ENCOUNTER — Other Ambulatory Visit: Payer: Self-pay

## 2019-09-27 ENCOUNTER — Ambulatory Visit: Payer: Medicare Other | Admitting: Urology

## 2019-09-27 VITALS — BP 118/80 | HR 94 | Ht 71.0 in | Wt 209.0 lb

## 2019-09-27 DIAGNOSIS — N138 Other obstructive and reflux uropathy: Secondary | ICD-10-CM

## 2019-09-27 DIAGNOSIS — N401 Enlarged prostate with lower urinary tract symptoms: Secondary | ICD-10-CM | POA: Diagnosis not present

## 2019-09-27 DIAGNOSIS — N390 Urinary tract infection, site not specified: Secondary | ICD-10-CM

## 2019-09-27 LAB — URINALYSIS, COMPLETE
Bilirubin, UA: NEGATIVE
Glucose, UA: NEGATIVE
Ketones, UA: NEGATIVE
Leukocytes,UA: NEGATIVE
Nitrite, UA: NEGATIVE
Protein,UA: NEGATIVE
RBC, UA: NEGATIVE
Specific Gravity, UA: 1.02 (ref 1.005–1.030)
Urobilinogen, Ur: 0.2 mg/dL (ref 0.2–1.0)
pH, UA: 6.5 (ref 5.0–7.5)

## 2019-09-27 LAB — MICROSCOPIC EXAMINATION
Bacteria, UA: NONE SEEN
RBC, Urine: NONE SEEN /HPF (ref 0–2)

## 2019-09-27 NOTE — Progress Notes (Signed)
   09/27/19  CC:  Chief Complaint  Patient presents with  . Cysto    HPI:  Casey Peters returns for cystoscopy and possible urethral stricture dilation.  He started Bactrim DS.  Blood pressure 118/80, pulse 94, height 5\' 11"  (1.803 m), weight 209 lb (94.8 kg). NED. A&Ox3.   No respiratory distress   Abd soft, NT, ND Normal phallus with bilateral descended testicles  Cystoscopy Procedure Note  Patient identification was confirmed, informed consent was obtained, and patient was prepped using Betadine solution.  Lidocaine jelly was administered per urethral meatus.     Pre-Procedure: - Inspection reveals a normal caliber ureteral meatus.  Procedure: The flexible cystoscope was introduced without difficulty - From the mid penile urethra out to the meatus there is some scarring of the urethra and squamous metaplasia, but the flexible cystoscope easily dilated through into more normal proximal urethra. There was no focal stricture and no erythema.   -The prostate appeared normal, mild hyperplasia, no obstruction -The bladder neck appeared normal -Bilateral ureteral orifices identified - Bladder mucosa  reveals no ulcers, tumors, or lesions - No bladder stones - No trabeculation  Retroflexion shows normal bladder neck  I also passed a 14 French catheter and it went without difficulty.  The bladder was drained and the catheter withdrawn.  Urine was clear yellow.   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  He can continue Bactrim DS nightly but I do not recommend long-term antibiotic suppression at this point.  He noted some improvement in his flow on antibiotics.  We can monitor.  I think passing the scope and the 14 French catheter cleared a lot of the squamous metaplasia out.  No follow-ups on file.  Festus Aloe, MD

## 2019-10-26 DIAGNOSIS — R7989 Other specified abnormal findings of blood chemistry: Secondary | ICD-10-CM | POA: Insufficient documentation

## 2019-12-13 ENCOUNTER — Other Ambulatory Visit: Payer: Self-pay

## 2019-12-13 ENCOUNTER — Emergency Department
Admission: EM | Admit: 2019-12-13 | Discharge: 2019-12-13 | Disposition: A | Discharge status: Home / Self Care | Payer: Medicare PPO | Attending: Emergency Medicine | Admitting: Emergency Medicine

## 2019-12-13 ENCOUNTER — Emergency Department: Payer: Medicare PPO

## 2019-12-13 DIAGNOSIS — I5022 Chronic systolic (congestive) heart failure: Secondary | ICD-10-CM | POA: Insufficient documentation

## 2019-12-13 DIAGNOSIS — Z7982 Long term (current) use of aspirin: Secondary | ICD-10-CM | POA: Diagnosis not present

## 2019-12-13 DIAGNOSIS — W312XXA Contact with powered woodworking and forming machines, initial encounter: Secondary | ICD-10-CM | POA: Diagnosis not present

## 2019-12-13 DIAGNOSIS — E119 Type 2 diabetes mellitus without complications: Secondary | ICD-10-CM | POA: Insufficient documentation

## 2019-12-13 DIAGNOSIS — S61112A Laceration without foreign body of left thumb with damage to nail, initial encounter: Secondary | ICD-10-CM

## 2019-12-13 DIAGNOSIS — Y999 Unspecified external cause status: Secondary | ICD-10-CM | POA: Diagnosis not present

## 2019-12-13 DIAGNOSIS — F17290 Nicotine dependence, other tobacco product, uncomplicated: Secondary | ICD-10-CM | POA: Insufficient documentation

## 2019-12-13 DIAGNOSIS — Z7984 Long term (current) use of oral hypoglycemic drugs: Secondary | ICD-10-CM | POA: Diagnosis not present

## 2019-12-13 DIAGNOSIS — I110 Hypertensive heart disease with heart failure: Secondary | ICD-10-CM | POA: Insufficient documentation

## 2019-12-13 DIAGNOSIS — Y929 Unspecified place or not applicable: Secondary | ICD-10-CM | POA: Diagnosis not present

## 2019-12-13 DIAGNOSIS — S6992XA Unspecified injury of left wrist, hand and finger(s), initial encounter: Secondary | ICD-10-CM | POA: Diagnosis present

## 2019-12-13 DIAGNOSIS — Z23 Encounter for immunization: Secondary | ICD-10-CM | POA: Diagnosis not present

## 2019-12-13 DIAGNOSIS — Y9389 Activity, other specified: Secondary | ICD-10-CM | POA: Diagnosis not present

## 2019-12-13 MED ORDER — TETANUS-DIPHTH-ACELL PERTUSSIS 5-2.5-18.5 LF-MCG/0.5 IM SUSP
0.5000 mL | Freq: Once | INTRAMUSCULAR | Status: AC
  Administered 2019-12-13: 0.5 mL via INTRAMUSCULAR
  Filled 2019-12-13: qty 0.5

## 2019-12-13 MED ORDER — CEPHALEXIN 500 MG PO CAPS: 500.0000 mg | ORAL_CAPSULE | Freq: Two times a day (BID) | ORAL | 0 refills | Status: AC

## 2019-12-13 MED ORDER — CEPHALEXIN 500 MG PO CAPS
500.0000 mg | ORAL_CAPSULE | Freq: Once | ORAL | Status: AC
  Administered 2019-12-13: 500 mg via ORAL
  Filled 2019-12-13: qty 1

## 2019-12-13 MED ORDER — LIDOCAINE HCL (PF) 1 % IJ SOLN
INTRAMUSCULAR | Status: AC
  Administered 2019-12-13: 5 mL
  Filled 2019-12-13: qty 5

## 2019-12-13 MED ORDER — LIDOCAINE HCL (PF) 1 % IJ SOLN
30.0000 mL | Freq: Once | INTRAMUSCULAR | Status: AC
  Filled 2019-12-13: qty 30

## 2019-12-13 NOTE — ED Triage Notes (Signed)
Pt states he was cutting wood on a table saw and cut down the center of the left thumb to the base of the nail bed.

## 2019-12-13 NOTE — ED Notes (Signed)
Neosporin and nonstick dressing applied to the wound per MD. Pt tolerated well.

## 2019-12-13 NOTE — ED Notes (Signed)
Pt to the er for a lac to the thumb on the left hand from a table saw. Wound is macerated and edges are not linear. Pt does not know when his last tetanus was. Pt caused the wound with a table saw.

## 2019-12-13 NOTE — ED Provider Notes (Signed)
[facility] Emergency Department Provider Note   ____________________________________________   First MD Initiated Contact with Patient 12/13/19 1524     (approximate)  I have reviewed the triage vital signs and the nursing notes.   HISTORY  Chief Complaint Laceration    HPI Casey Peters is a 74 y.o. male with past medical history of hypertension, diabetes, and CHF who presents to the ED complaining of finger laceration.  Patient reports that about 30 minutes prior to arrival he was cutting wood on a table saw when the saw came down on the middle part of his left thumb.  He suffered a laceration to the medial portion of his left thumb that wraps around to the distal portion.  He was able to control bleeding at home but complains of significant ongoing pain.  He is unsure of his last tetanus.        Past Medical History:  Diagnosis Date  . BP (high blood pressure) 06/08/2014  . CHF (congestive heart failure) (HCC)   . Constipation   . Diabetes (HCC)   . HLD (hyperlipidemia)   . HTN (hypertension)   . Rhinitis     Patient Active Problem List   Diagnosis Date Noted  . Chronic systolic heart failure (HCC) 10/25/2018  . Tobacco use 10/25/2018  . Rising PSA level 07/10/2015  . Microscopic hematuria 07/10/2015  . Type 2 diabetes mellitus (HCC) 06/08/2014  . HLD (hyperlipidemia) 06/08/2014  . BP (high blood pressure) 06/08/2014  . Obesity, diabetes, and hypertension syndrome (HCC) 06/08/2014  . Adiposity 06/08/2014  . Controlled type 2 diabetes mellitus without complication (HCC) 06/08/2014    Past Surgical History:  Procedure Laterality Date  . HERNIA REPAIR  1998  . PILONIDAL CYST EXCISION    . TONSILLECTOMY    . URETHRA SURGERY  1995    Prior to Admission medications   Medication Sig Start Date End Date Taking? Authorizing Provider  aspirin EC 81 MG tablet Take 81 mg by mouth daily.    [provider]  carvedilol (COREG)  3.125 MG tablet Take 3.125 mg by mouth 2 (two) times daily. 05/19/18   [provider]  cephALEXin (KEFLEX) 500 MG capsule Take 1 capsule (500 mg total) by mouth 2 (two) times daily for 7 days. 12/13/19 12/20/19  Chesley Noon, MD  furosemide (LASIX) 40 MG tablet Take 40 mg by mouth 2 (two) times daily. 06/07/18   [provider]  metFORMIN (GLUCOPHAGE) 1000 MG tablet Take 500 mg by mouth daily with breakfast.     [provider]  ONE TOUCH ULTRA TEST test strip USE 1 STRIP 3 TIMES A DAY TO CHECK BLOOD GLUCOSE LEVELS 09/24/18   [provider]  spironolactone (ALDACTONE) 25 MG tablet Take 25 mg by mouth daily. 02/21/18 02/21/19  [provider]  sulfamethoxazole-trimethoprim (BACTRIM DS) 800-160 MG tablet Take 1 tablet by mouth 2 (two) times daily. Start two days before cystoscopy 09/13/19   Jerilee Field, MD  testosterone (ANDROGEL) 50 MG/5GM (1%) GEL Apply 5 g topically daily. 04/26/18   [provider]    Allergies Patient has no known allergies.  Family History  Problem Relation Age of Onset  . Hematuria Father   . Kidney disease Neg Hx   . Prostate cancer Neg Hx     Social History Social History   Tobacco Use  . Smoking status: Current Every Day Smoker    Types: Cigars  . Smokeless tobacco: Never Used  . Tobacco  comment: 2-3 cigars per day  Substance Use Topics  . Alcohol use: No    Alcohol/week: 0.0 standard drinks  . Drug use: No    Review of Systems  Constitutional: No fever/chills Eyes: No visual changes. ENT: No sore throat. Cardiovascular: Denies chest pain. Respiratory: Denies shortness of breath. Gastrointestinal: No abdominal pain.  No nausea, no vomiting.  No diarrhea.  No constipation. Genitourinary: Negative for dysuria. Musculoskeletal: Negative for back pain. Skin: Negative for rash.  Positive for thumb laceration. Neurological: Negative for headaches, focal weakness or numbness.   ____________________________________________   PHYSICAL EXAM:  VITAL SIGNS: ED Triage Vitals  Enc Vitals Group     BP 12/13/19 1455 135/82     Pulse Rate 12/13/19 1455 94     Resp 12/13/19 1455 17     Temp 12/13/19 1455 97.6 F (36.4 C)     Temp Source 12/13/19 1455 Oral     SpO2 12/13/19 1455 97 %     Weight 12/13/19 1456 206 lb (93.4 kg)     Height 12/13/19 1456 5\' 11"  (1.803 m)     Head Circumference --      Peak Flow --      Pain Score 12/13/19 1456 6     Pain Loc --      Pain Edu? --      Excl. in [place]? --     Constitutional: Alert and oriented. Eyes: Conjunctivae are normal. Head: Atraumatic. Nose: No congestion/rhinnorhea. Mouth/Throat: Mucous membranes are moist. Neck: Normal ROM Cardiovascular: Normal rate, regular rhythm. Grossly normal heart sounds. Respiratory: Normal respiratory effort.  No retractions. Lungs CTAB. Gastrointestinal: Soft and nontender. No distention. Genitourinary: deferred Musculoskeletal: No lower extremity tenderness nor edema. Neurologic:  Normal speech and language. No gross focal neurologic deficits are appreciated. Skin:  Skin is warm and dry. No rash noted.  Approximately 5 cm irregular laceration over the medial portion of left thumb extending to distal portion with no active bleeding. Psychiatric: Mood and affect are normal. Speech and behavior are normal.  ____________________________________________   LABS (all labs ordered are listed, but only abnormal results are displayed)  Labs Reviewed - No data to display    PROCEDURES  Procedure(s) performed (including Critical Care):  Marland KitchenMarland KitchenLaceration Repair  Date/Time: 12/13/2019 4:38 PM Performed by: Blake Divine, MD Authorized by: Blake Divine, MD   Consent:    Consent obtained:  Verbal   Consent given by:  Patient   Risks discussed:  Infection, pain, retained foreign body, tendon damage, need for additional repair, nerve damage, poor cosmetic result, poor wound healing  and vascular damage   Alternatives discussed:  No treatment Anesthesia (see MAR for exact dosages):    Anesthesia method:  Nerve block   Block anesthetic:  Lidocaine 1% w/o epi   Block technique:  Digital block   Block injection procedure:  Anatomic landmarks identified, introduced needle, incremental injection, negative aspiration for blood and anatomic landmarks palpated   Block outcome:  Anesthesia achieved Laceration details:    Location:  Finger   Finger location:  L thumb   Length (cm):  5 Repair type:    Repair type:  Simple Pre-procedure details:    Preparation:  Patient was prepped and draped in usual sterile fashion and imaging obtained to evaluate for foreign bodies Exploration:    Wound exploration: wound explored through full range of motion     Wound extent: no foreign bodies/material noted     Contaminated: no   Treatment:  Area cleansed with:  Saline   Amount of cleaning:  Standard   Irrigation solution:  Sterile saline   Irrigation method:  Pressure wash   Visualized foreign bodies/material removed: no   Skin repair:    Repair method:  Sutures   Suture size:  4-0   Suture material:  Nylon   Suture technique:  Simple interrupted   Number of sutures:  8 Approximation:    Approximation:  Loose Post-procedure details:    Dressing:  Antibiotic ointment, non-adherent dressing and sterile dressing   Patient tolerance of procedure:  Tolerated well, no immediate complications     ____________________________________________   INITIAL IMPRESSION / ASSESSMENT AND PLAN / ED COURSE       74 year old male presents to the ED complaining of thumb laceration suffered while using a table saw.  Bleeding is controlled upon arrival and he appears neurovascularly intact to his distal thumb.  We will update tetanus, irrigate wound, and loosely approximate.  X-ray shows no evidence of fracture or foreign body.  He has full range of motion of his left thumb, do not suspect  tendon injury.  Laceration was repaired, however wound with significant degree of maceration over lateral portion of nail.  Antibiotic ointment and dressing were applied and patient be started on a course of Keflex.  He was counseled to follow-up either with his PCP or in the ED for suture removal in 7 to 10 days and to return to the ED sooner for worsening pain, redness, or drainage.  Patient agrees with plan.      ____________________________________________   FINAL CLINICAL IMPRESSION(S) / ED DIAGNOSES  Final diagnoses:  Laceration of left thumb without foreign body with damage to nail, initial encounter     ED Discharge Orders         Ordered    cephALEXin (KEFLEX) 500 MG capsule  2 times daily     12/13/19 1636           Note:  This document was prepared using Dragon voice recognition software and may include unintentional dictation errors.   Chesley Noon, MD 12/13/19 1640

## 2019-12-22 ENCOUNTER — Ambulatory Visit
Admission: EM | Admit: 2019-12-22 | Discharge: 2019-12-22 | Disposition: A | Discharge status: Home / Self Care | Payer: Medicare PPO

## 2019-12-22 DIAGNOSIS — Z4802 Encounter for removal of sutures: Secondary | ICD-10-CM | POA: Diagnosis not present

## 2019-12-22 NOTE — ED Triage Notes (Signed)
Pt presents for removal of 8 stitches placed in the left thumb on 12/13/19. Pt states tip of thumb is still very sensitive to touch. Pt denies any radiating pain or signs of infection.

## 2019-12-29 ENCOUNTER — Ambulatory Visit: Payer: Medicare Other | Admitting: Urology

## 2020-02-08 DIAGNOSIS — R002 Palpitations: Secondary | ICD-10-CM | POA: Insufficient documentation

## 2020-07-12 ENCOUNTER — Ambulatory Visit (INDEPENDENT_AMBULATORY_CARE_PROVIDER_SITE_OTHER): Payer: Medicare PPO | Admitting: Urology

## 2020-07-12 ENCOUNTER — Encounter: Payer: Self-pay | Admitting: Urology

## 2020-07-12 ENCOUNTER — Other Ambulatory Visit: Payer: Self-pay

## 2020-07-12 VITALS — BP 102/71 | HR 96 | Ht 71.0 in | Wt 209.0 lb

## 2020-07-12 DIAGNOSIS — N138 Other obstructive and reflux uropathy: Secondary | ICD-10-CM

## 2020-07-12 DIAGNOSIS — N401 Enlarged prostate with lower urinary tract symptoms: Secondary | ICD-10-CM

## 2020-07-12 DIAGNOSIS — N99114 Postprocedural urethral stricture, male, unspecified: Secondary | ICD-10-CM

## 2020-07-12 LAB — BLADDER SCAN AMB NON-IMAGING

## 2020-07-12 NOTE — Progress Notes (Signed)
07/12/2020 10:46 AM   Casey Peters 09-27-1946 [id number]  Referring provider: Gracelyn Nurse, MD [address] [place],  [place] [address]  Chief Complaint  Patient presents with  . Benign Prostatic Hypertrophy    HPI:  F/u -   1) urethral stricture - he had gross hematuria after the cystoscopy Nov 2016but his LUTS improved. 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 Occasional CIC. CT was done Oct 2016.He saw Dr. Vonita Moss at [facility] and it sounds like they discussed cutting back proximal to the sx or a perineal urethrostomy. Cysto 12/20 with scarring and squamous metaplasia of penile urethra.   2) BPH, LUTS -Prostate was about 65 gramson CT. He has moderate LUTS including nocturia 2-3x. Tried tamsulosin Jun 2018 for weak stream, frequency and urgency without much change.   3) prostate nodule -PSA 1.13 Feb 2016.Prostate biopsy Nov 2016 benign with 42 g prostate(for rising PSA up to 3.1).His July 2018 PSA was 1. A small nodule was noted on the left prostate Sep 2018. F/u prostate MRI 08/06/2017 was done and benign. Prostate measured 61 g. His PSA was 1.17 Apr 2019.   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  5) flank pain, dysuria - Mar 2019 - Renal US was benign. Prior CT abd in 2016. No stones.   Today, [name] returns in management of above. He is concerned about his kidneys and urine output. He only voids 3-4 oz per time. Stream is adequate but sometimes he strains and drips. UA clear and PVR 38 ml. He quit doing CIC. He takes lasix for CHF. His bun and Cr were normal 08/21 with Cr 1.1 and gfr 66. AUASS = 28 and most dissatisfied.    PMH: Past Medical History:  Diagnosis Date  . BP (high blood pressure) 06/08/2014  . CHF (congestive heart failure) (HCC)   . Constipation   . Diabetes (HCC)   . HLD (hyperlipidemia)   . HTN (hypertension)   . Rhinitis     Surgical History: Past Surgical History:   Procedure Laterality Date  . HERNIA REPAIR  1998  . PILONIDAL CYST EXCISION    . TONSILLECTOMY    . URETHRA SURGERY  1995    Home Medications:  Allergies as of 07/12/2020   No Known Allergies     Medication List       Accurate as of July 12, 2020 10:46 AM. If you have any questions, ask your nurse or doctor.        aspirin EC 81 MG tablet Take 81 mg by mouth daily.   carvedilol 3.125 MG tablet Commonly known as: COREG Take 3.125 mg by mouth 2 (two) times daily.   furosemide 40 MG tablet Commonly known as: LASIX Take 40 mg by mouth 2 (two) times daily.   metFORMIN 1000 MG tablet Commonly known as: GLUCOPHAGE Take 500 mg by mouth daily with breakfast.   ONE TOUCH ULTRA TEST test strip Generic drug: glucose blood USE 1 STRIP 3 TIMES A DAY TO CHECK BLOOD GLUCOSE LEVELS   spironolactone 25 MG tablet Commonly known as: ALDACTONE Take 25 mg by mouth daily.   sulfamethoxazole-trimethoprim 800-160 MG tablet Commonly known as: BACTRIM DS Take 1 tablet by mouth 2 (two) times daily. Start two days before cystoscopy   testosterone 50 MG/5GM (1%) Gel Commonly known as: ANDROGEL Apply 5 g topically daily.       Allergies: No Known Allergies  Family History: Family  History  Problem Relation Age of Onset  . Hematuria Father   . Kidney disease Neg Hx   . Prostate cancer Neg Hx     Social History:  reports that he has been smoking cigars. He has never used smokeless tobacco. He reports that he does not drink alcohol and does not use drugs.   Physical Exam: There were no vitals taken for this visit.  Constitutional:  Alert and oriented, No acute distress. HEENT: [place] AT, moist mucus membranes.  Trachea midline, no masses. Cardiovascular: No clubbing, cyanosis, or edema. Respiratory: Normal respiratory effort, no increased work of breathing. GI: Abdomen is soft, nontender, nondistended, no abdominal masses GU: No CVA tenderness GU: Penis circumcised, normal  foreskin, testicles descended bilaterally and palpably normal, bilateral epididymis palpably normal, scrotum normal; meatus stenotic. Some induration of penile urethra.  DRE: Prostate 50 g, smooth without hard area or nodule Lymph: No cervical or inguinal lymphadenopathy. Skin: No rashes, bruises or suspicious lesions. Neurologic: Grossly intact, no focal deficits, moving all 4 extremities. Psychiatric: Normal mood and affect.  Laboratory Data: No results found for: WBC, HGB, HCT, MCV, PLT  Lab Results  Component Value Date   CREATININE 1.30 (H) 06/30/2019    No results found for: PSA  No results found for: TESTOSTERONE  No results found for: HGBA1C  Urinalysis    Component Value Date/Time   APPEARANCEUR Clear 09/27/2019 1133   GLUCOSEU Negative 09/27/2019 1133   BILIRUBINUR Negative 09/27/2019 1133   PROTEINUR Negative 09/27/2019 1133   NITRITE Negative 09/27/2019 1133   LEUKOCYTESUR Negative 09/27/2019 1133    Lab Results  Component Value Date   LABMICR See below: 09/27/2019   WBCUA 0-5 09/27/2019   RBCUA 0-2 11/11/2018   LABEPIT 0-10 09/27/2019   MUCUS Present (A) 12/03/2017   BACTERIA None seen 09/27/2019    Pertinent Imaging: n/a No results found for this or any previous visit.  No results found for this or any previous visit.  No results found for this or any previous visit.  No results found for this or any previous visit.  Results for orders placed during the hospital encounter of 12/22/17  US RENAL  Narrative CLINICAL DATA:  Pyuria.  EXAM: RENAL / URINARY TRACT ULTRASOUND COMPLETE  COMPARISON:  CT 07/19/2015.  FINDINGS: Right Kidney:  Length: 12.6 cm. Echogenicity is normal. No hydronephrosis. 8 mm parapelvic cyst.  Left Kidney:  Length: 11.8 cm. Echogenicity is normal. No hydronephrosis. 8 mm lateral midportion cyst.  Bladder:  Appears normal for degree of bladder distention.  IMPRESSION: Unremarkable renal ultrasound. Normal  renal size. Normal echogenicity. No hydronephrosis. Tiny renal cysts.   Electronically Signed By: Paulina Fusi M.D. On: 12/23/2017 08:51  No results found for this or any previous visit.  No results found for this or any previous visit.  No results found for this or any previous visit.   Assessment & Plan:    1. BPH with obstruction/lower urinary tract symptoms UA is clear and PVR nl. He was reassured about his UOP. The other component would be his Bun/Cr which was 28 and Cr 1.1. GFR was 66.  - Urinalysis, Complete - BLADDER SCAN AMB NON-IMAGING  2. Urethral sx - as above - stable . He asked about cysto and sx dilation and we went over the nature r/b/a to dilation. Give clear UA and normal PVR -- will monitor.   I'll see him back in 6 mo with a PVR. He said he would likely get PSA soon  at his PCP/yearly OV.   No follow-ups on file.  Jerilee Field, MD  [facility] [address] [place], [place] [address] [phone]

## 2020-07-13 LAB — URINALYSIS, COMPLETE
Bilirubin, UA: NEGATIVE
Glucose, UA: NEGATIVE
Ketones, UA: NEGATIVE
Nitrite, UA: NEGATIVE
Protein,UA: NEGATIVE
RBC, UA: NEGATIVE
Specific Gravity, UA: 1.015 (ref 1.005–1.030)
Urobilinogen, Ur: 1 mg/dL (ref 0.2–1.0)
pH, UA: 5 (ref 5.0–7.5)

## 2020-07-13 LAB — MICROSCOPIC EXAMINATION
Bacteria, UA: NONE SEEN
RBC, Urine: NONE SEEN /hpf (ref 0–2)

## 2020-07-16 ENCOUNTER — Other Ambulatory Visit: Payer: Self-pay

## 2020-07-16 ENCOUNTER — Ambulatory Visit (INDEPENDENT_AMBULATORY_CARE_PROVIDER_SITE_OTHER): Payer: Medicare PPO | Admitting: Physician Assistant

## 2020-07-16 VITALS — BP 94/57 | HR 108 | Temp 98.3°F | Ht 71.0 in | Wt 211.3 lb

## 2020-07-16 DIAGNOSIS — N99114 Postprocedural urethral stricture, male, unspecified: Secondary | ICD-10-CM | POA: Diagnosis not present

## 2020-07-16 DIAGNOSIS — R39198 Other difficulties with micturition: Secondary | ICD-10-CM

## 2020-07-16 LAB — BLADDER SCAN AMB NON-IMAGING: SCA Result: 20

## 2020-07-16 NOTE — Progress Notes (Signed)
07/16/2020 2:53 PM   Casey Peters 02-25-1946 [id number]  CC: Chief Complaint  Patient presents with  . Follow-up    HPI: Casey Peters is a 74 y.o. male with PMH BPH with LUTS, urethral stricture s/p urethral reconstruction 20+ years ago, benign prostate nodule, flank pain, and dysuria who presents today for evaluation of possible urinary retention.  He was seen in clinic most recently by Dr. Junious Silk 4 days ago with no acute concerns.  UA benign at that visit.  Today he reports progressive bothersome urinary symptoms including dysuria, frequency, and hesitancy without acute change.  He is concerned for stricture recurrence.  He reports self cathing most recently approximately 1 week ago and states he was unable to pass the catheter into his urethra.  He is unsure what size catheter he was using at home.  He has run out of catheters.  Patient unable to void in clinic today. PVR 52m.  PMH: Past Medical History:  Diagnosis Date  . BP (high blood pressure) 06/08/2014  . CHF (congestive heart failure) ([facility]   . Constipation   . Diabetes ([facility]   . HLD (hyperlipidemia)   . HTN (hypertension)   . Rhinitis     Surgical History: Past Surgical History:  Procedure Laterality Date  . HERNIA REPAIR  1998  . PILONIDAL CYST EXCISION    . TONSILLECTOMY    . URETHRA SURGERY  1995    Home Medications:  Allergies as of 07/16/2020   No Known Allergies     Medication List       Accurate as of July 16, 2020  2:53 PM. If you have any questions, ask your nurse or doctor.        aspirin EC 81 MG tablet Take 81 mg by mouth daily.   carvedilol 3.125 MG tablet Commonly known as: COREG Take 3.125 mg by mouth 2 (two) times daily.   fenofibrate 145 MG tablet Commonly known as: TRICOR   furosemide 40 MG tablet Commonly known as: LASIX Take 40 mg by mouth 2 (two) times daily.   metFORMIN 1000 MG tablet Commonly known as: GLUCOPHAGE Take 500 mg by mouth daily  with breakfast.   ONE TOUCH ULTRA TEST test strip Generic drug: glucose blood USE 1 STRIP 3 TIMES A DAY TO CHECK BLOOD GLUCOSE LEVELS   spironolactone 25 MG tablet Commonly known as: ALDACTONE Take 25 mg by mouth daily.   testosterone 50 MG/5GM (1%) Gel Commonly known as: ANDROGEL Apply 5 g topically daily.       Allergies:  No Known Allergies  Family History: Family History  Problem Relation Age of Onset  . Hematuria Father   . Kidney disease Neg Hx   . Prostate cancer Neg Hx     Social History:   reports that he has been smoking cigars. He has never used smokeless tobacco. He reports that he does not drink alcohol and does not use drugs.  Physical Exam: BP (!) 94/57   Pulse (!) 108   Temp 98.3 F (36.8 C) (Oral)   Ht '5\' 11"'  (1.803 m)   Wt 211 lb 4.8 oz (95.8 kg)   BMI 29.47 kg/m   Constitutional:  Alert and oriented, no acute distress, nontoxic appearing HEENT: [place], AT Cardiovascular: No clubbing, cyanosis, or edema Respiratory: Normal respiratory effort, no increased work of breathing GU: Circumcised penis.  Ventral urethral erosion to the level of the corona.  Distal urethral stricture visible at the meatus. Skin: No rashes, bruises  or suspicious lesions Neurologic: Grossly intact, no focal deficits, moving all 4 extremities Psychiatric: Normal mood and affect  Laboratory Data: Results for orders placed or performed in visit on 07/16/20  BLADDER SCAN AMB NON-IMAGING  Result Value Ref Range   SCA Result 20    Assessment & Plan:   1. Postprocedural stricture of anterior urethra Progressive worsening of urinary symptoms without acute change.  PVR WNL.  I have low concern for urinary tract infection today in the absence of acute change and with recently benign UA.  I attempted to sound the patient's urethra in clinic today.  Using sterile technique, I was able to pass a 12 French straight tip CIC catheter past the level of the stricture, however I met  resistance at the level of the prostate and was unable to advance the catheter into the bladder.  I next attempted to pass a 10 [place] coud catheter, however this buckled and I was also unable to pass it into the level of the bladder.  Finally, I attempted to pass a 14 French red rubber catheter, which is unable to advance past the meatus.  Patient is able to empty his bladder appropriately despite recurrence of a 31 French distal urethral stricture.  I provided him with additional catheter supplies in clinic today and counseled him to begin sounding his urethra every other day.  Wll schedule him for cystoscopy with Dr. Junious Silk for further evaluation and possible dilation.  I counseled him to follow-up with [place] sooner if he develops the inability to urinate associated with lower abdominal pain or abdominal distention.  Patient expressed understanding. - BLADDER SCAN AMB NON-IMAGING   Return in about 2 weeks (around 07/30/2020) for Cystoscopy with Dr. Junious Silk.  Debroah Loop, PA-C  [facility] [address] [place], [place] [address] [phone]

## 2020-07-26 ENCOUNTER — Encounter: Payer: Self-pay | Admitting: Urology

## 2020-07-26 ENCOUNTER — Other Ambulatory Visit: Payer: Self-pay

## 2020-07-26 ENCOUNTER — Ambulatory Visit (INDEPENDENT_AMBULATORY_CARE_PROVIDER_SITE_OTHER): Payer: Medicare PPO | Admitting: Urology

## 2020-07-26 VITALS — BP 120/80 | HR 92 | Ht 71.0 in | Wt 210.6 lb

## 2020-07-26 DIAGNOSIS — N99114 Postprocedural urethral stricture, male, unspecified: Secondary | ICD-10-CM

## 2020-07-26 LAB — MICROSCOPIC EXAMINATION: Bacteria, UA: NONE SEEN

## 2020-07-26 LAB — URINALYSIS, COMPLETE
Bilirubin, UA: NEGATIVE
Glucose, UA: NEGATIVE
Ketones, UA: NEGATIVE
Nitrite, UA: NEGATIVE
Protein,UA: NEGATIVE
Specific Gravity, UA: 1.025 (ref 1.005–1.030)
Urobilinogen, Ur: 0.2 mg/dL (ref 0.2–1.0)
pH, UA: 5.5 (ref 5.0–7.5)

## 2020-07-26 MED ORDER — LIDOCAINE HCL URETHRAL/MUCOSAL 2 % EX GEL
1.0000 "application " | Freq: Once | CUTANEOUS | Status: AC
  Administered 2020-07-26: 1 via URETHRAL

## 2020-07-26 NOTE — Progress Notes (Signed)
   07/26/20  CC:  Chief Complaint  Patient presents with  . Cysto    HPI: Casey Peters returns back for cystoscopy.  He has considered cystoscopy and like to proceed with it.  He is having some trouble with his stream and cathing.  He has a history of reconstructive penile urethra and on cystoscopy today there is about a 2 cm penile urethral segment that is tight with squamous metaplasia but dilates and clears out with the scope.  The bulbar urethra, prostate and bladder are normal.   Blood pressure 120/80, pulse 92, height 5\' 11"  (1.803 m), weight 210 lb 9.6 oz (95.5 kg), SpO2 95 %. NED. A&Ox3.   No respiratory distress   Abd soft, NT, ND Normal phallus with bilateral descended testicles  Cystoscopy Procedure Note  Patient identification was confirmed, informed consent was obtained, and patient was prepped using Betadine solution.  Lidocaine jelly was administered per urethral meatus.     Pre-Procedure: - Inspection reveals a normal caliber ureteral meatus.  Procedure: The flexible cystoscope was introduced without difficulty -About a 2 cm penile urethral stricture really more tightness and squamous metaplasia overgrowth.  I work the scope through it as I have done in the past and the proximal penile urethra and bulbar urethra are normal.  I then passed a 14 French Silastic catheter to stretch it and repassed the cystoscope and it went fairly easily into the proximal urethra. - mild BPH, but not obstructive - normal bladder neck - Bilateral ureteral orifices identified - Bladder mucosa  reveals no ulcers, tumors, or lesions - No bladder stones - No trabeculation  Retroflexion shows normal bladder neck    Post-Procedure: - Patient tolerated the procedure well  Assessment/ Plan:  Urethral sx -he will continue CIC a couple of times per week.  I will see him back in a few months to check on his symptoms.  , MD

## 2020-07-26 NOTE — Patient Instructions (Signed)
Clean Intermittent Catheterization, Male  Clean intermittent catheterization (CIC) is a procedure to stretch the urethra by placing a small, flexible tube (catheter) though the urethra. The urethra is a tube in the body that carries urine from the bladder out of the body.  Your health care provider will show you how to perform CIC and will help you to become comfortable performing this procedure at home. Your health care provider will also help you to get the home care supplies that are needed for this procedure. Supplies needed:  Germ-free (sterile), water-based lubricant.  A container for urine collection. You may also use the toilet to dispose of urine from the catheter.  A catheter. Your health care provider will determine the best size for you. ? Use this catheter size: ______________________________  Sterile gloves.  Sterile gauze.  Medicated sterile swabs. How to perform this procedure: Most people need CIC at least 4 times per day to adequately empty the bladder. Your health care provider will tell you how often you should perform CIC.  Number of times per day to perform CIC: ______________________________________________________________________ To perform CIC, follow these steps: 1. Wash your hands with soap and water. If soap and water are not available, use hand sanitizer. 2. Prepare the supplies that you will use during the procedure. Open the catheter pack, the lubricant, and the pack of medicated sterile swabs. If you have been told to keep the procedure sterile, do not touch your supplies until you are wearing gloves. 3. Get in a comfortable position. Possible positions include: ? Sitting on a toilet, a chair, or the edge of a bed. ? Standing near a toilet. ? Lying down with your head raised on pillows and your knees pointing to the ceiling. You may wish to place a waterproof mat or pad under you. 4. If you are using a urine collection container, position it between your  legs. 5. Urinate, if you are able. 6. Put on gloves. 7. Apply lubricant to about 2 inches (5 cm) of the tip of the catheter. 8. Set the catheter down on a clean, dry surface within reach. 9. Gently stretch your penis out from your body. Pull back any skin that covers the end of your penis (foreskin). Clean the end of your penis with medicated sterile swabs as told by your health care provider. 10. Hold your penis upward at a 45-60 degree angle. This helps to straighten the urethra. 11. Slowly insert the lubricated catheter straight into your urethra until you pass the blockage. This is usually about 4 inches (10 cm)  12. Slowly remove the catheter. Repeat step 11 one more time. 13. Discard the urine in the toilet. 14. Clean your penis using soap and water. 15. Move the foreskin back in place, if applicable. 16. If you are using a single-use catheter, discard the catheter and supplies. 17. Wash your hands with soap and water. 18. If you are using a reusable catheter, follow package instructions about how to clean the catheter after each use. How often should I perform this procedure?  Do CIC to empty your bladder every 4-6 hours or as often as told by your health care provider.  If you have symptoms of too much urine in your bladder (overdistension) and you are not able to urinate, perform CIC. Symptoms of overdistension may include: ? Restlessness. ? Sweating or chills. ? Headache. ? Flushed or pale skin. ? Bloated lower abdomen. What are the risks? Generally, this is a safe procedure, however problems may  occur, including:  Infection.  Injury to the urethra.  Irritation of the urethra. Follow these instructions at home  General instructions  Drink enough fluid to keep your urine pale yellow.  Dispose of a multiple use catheter when it becomes dry, brittle, or cloudy. This usually happens after you use the catheter for 1 week.  Avoid caffeine. Caffeine may make you need to  urinate more frequently and more urgently.  When traveling, bring extra supplies with you in case of delays. Keep supplies with you in a place that you can access easily. If traveling by plane: ? Make sure that the lubricant in your carry-on bag is less than 3.4 ounces (100 mL). ? Use a single-use catheter. It may be difficult to clean a reusable catheter in a small bathroom.  Take over-the-counter and prescription medicines only as told by your health care provider.  Keep all follow-up visits as told by your health care provider. This is important. Contact a health care provider if you:  Have difficulty performing CIC.  Have urine leaking during CIC.  Have: ? Dark or cloudy urine. ? Blood in your urine or in your catheter. ? A change in the smell of your urine or discharge. ? A burning feeling while you urinate.  Feel nauseous or you vomit.  Have pain in your abdomen, your back, or your sides below your ribs.  Have swelling or redness around the opening of your urethra.  Develop a rash or sores on your skin. Get help right away if you have:  A fever.  Symptoms that do not go away after 3 days.  Symptoms that suddenly get worse.  Severe pain.  A decrease in the amount of urine that drains from your bladder. Summary  Clean intermittent catheterization (CIC) is a procedure to remove urine from the bladder by placing a small, flexible tube (catheter) into the bladder though the urethra.  Your health care provider will show you how to perform CIC and will help you to become comfortable performing this procedure at home.  Most people need CIC at least 4 times per day to adequately empty the bladder. This information is not intended to replace advice given to you by your health care provider. Make sure you discuss any questions you have with your health care provider. Document Revised: 01/18/2019 Document Reviewed: 05/19/2018 Elsevier Patient Education  2020 [employer].

## 2020-08-02 ENCOUNTER — Emergency Department: Payer: Medicare PPO

## 2020-08-02 ENCOUNTER — Other Ambulatory Visit: Payer: Self-pay

## 2020-08-02 ENCOUNTER — Emergency Department
Admission: EM | Admit: 2020-08-02 | Discharge: 2020-08-02 | Disposition: A | Discharge status: Home / Self Care | Payer: Medicare PPO | Attending: Emergency Medicine | Admitting: Emergency Medicine

## 2020-08-02 DIAGNOSIS — R0602 Shortness of breath: Secondary | ICD-10-CM | POA: Diagnosis not present

## 2020-08-02 DIAGNOSIS — R0981 Nasal congestion: Secondary | ICD-10-CM | POA: Insufficient documentation

## 2020-08-02 DIAGNOSIS — R509 Fever, unspecified: Secondary | ICD-10-CM | POA: Insufficient documentation

## 2020-08-02 DIAGNOSIS — Z5321 Procedure and treatment not carried out due to patient leaving prior to being seen by health care provider: Secondary | ICD-10-CM | POA: Insufficient documentation

## 2020-08-02 DIAGNOSIS — R059 Cough, unspecified: Secondary | ICD-10-CM | POA: Insufficient documentation

## 2020-08-02 LAB — CBC
HCT: 45.5 % (ref 39.0–52.0)
Hemoglobin: 15.4 g/dL (ref 13.0–17.0)
MCH: 30.4 pg (ref 26.0–34.0)
MCHC: 33.8 g/dL (ref 30.0–36.0)
MCV: 89.7 fL (ref 80.0–100.0)
Platelets: 198 10*3/uL (ref 150–400)
RBC: 5.07 MIL/uL (ref 4.22–5.81)
RDW: 13.8 % (ref 11.5–15.5)
WBC: 7.5 10*3/uL (ref 4.0–10.5)
nRBC: 0 % (ref 0.0–0.2)

## 2020-08-02 LAB — BASIC METABOLIC PANEL
Anion gap: 13 (ref 5–15)
BUN: 26 mg/dL — ABNORMAL HIGH (ref 8–23)
CO2: 26 mmol/L (ref 22–32)
Calcium: 8.9 mg/dL (ref 8.9–10.3)
Chloride: 99 mmol/L (ref 98–111)
Creatinine, Ser: 1.33 mg/dL — ABNORMAL HIGH (ref 0.61–1.24)
GFR, Estimated: 57 mL/min — ABNORMAL LOW (ref >60)
Glucose, Bld: 143 mg/dL — ABNORMAL HIGH (ref 70–99)
Potassium: 4.1 mmol/L (ref 3.5–5.1)
Sodium: 138 mmol/L (ref 135–145)

## 2020-08-02 LAB — TROPONIN I (HIGH SENSITIVITY): Troponin I (High Sensitivity): 10 ng/L (ref <18)

## 2020-08-02 NOTE — ED Triage Notes (Signed)
C/o intermittent fevers that began Monday. Cough, congestion and SOB. Denies pain. Pt alert and oriented X4, cooperative, RR even and unlabored, color WNL. Pt in NAD.

## 2020-08-02 NOTE — ED Notes (Signed)
Pt's wife called and stated that patient was at [facility] requested X-ray's be sent to [facility], this RN notified by screener pt no longer in ED.

## 2020-08-13 ENCOUNTER — Other Ambulatory Visit
Admission: RE | Admit: 2020-08-13 | Discharge: 2020-08-13 | Disposition: A | Discharge status: Home / Self Care | Payer: Medicare PPO | Source: Ambulatory Visit | Attending: Specialist | Admitting: Specialist

## 2020-08-13 DIAGNOSIS — R0902 Hypoxemia: Secondary | ICD-10-CM | POA: Insufficient documentation

## 2020-08-13 LAB — FIBRIN DERIVATIVES D-DIMER (ARMC ONLY): Fibrin derivatives D-dimer (ARMC): 927.28 ng/mL (FEU) — ABNORMAL HIGH (ref 0.00–499.00)

## 2020-08-14 ENCOUNTER — Other Ambulatory Visit: Payer: Self-pay | Admitting: Specialist

## 2020-08-14 ENCOUNTER — Other Ambulatory Visit: Payer: Self-pay

## 2020-08-14 ENCOUNTER — Ambulatory Visit
Admission: RE | Admit: 2020-08-14 | Discharge: 2020-08-14 | Disposition: A | Discharge status: Home / Self Care | Payer: Medicare PPO | Source: Ambulatory Visit | Attending: Specialist | Admitting: Specialist

## 2020-08-14 DIAGNOSIS — R0902 Hypoxemia: Secondary | ICD-10-CM | POA: Insufficient documentation

## 2020-08-14 DIAGNOSIS — R7989 Other specified abnormal findings of blood chemistry: Secondary | ICD-10-CM | POA: Diagnosis present

## 2020-08-14 MED ORDER — IOHEXOL 350 MG/ML SOLN
75.0000 mL | Freq: Once | INTRAVENOUS | Status: AC | PRN
  Administered 2020-08-14: 75 mL via INTRAVENOUS

## 2020-09-16 ENCOUNTER — Telehealth: Payer: Self-pay

## 2020-09-16 NOTE — Telephone Encounter (Signed)
Incoming call on triage line from patient in regards to catheter Rx. Patient states he was having issues getting catheter order set up. His supplier is [employer]. Called [employer] on patients behalf to make sure they didn't need anything else from [place]. [employer] states they simply need to verify some things with patient, however patient has been unreachable or hangs up when they get him on the phone. Instructed patient to call [employer] to finish setting up order. Patient voiced his understanding.

## 2020-09-23 ENCOUNTER — Encounter: Payer: Self-pay | Admitting: Physician Assistant

## 2020-09-23 ENCOUNTER — Telehealth: Payer: Self-pay

## 2020-09-23 ENCOUNTER — Ambulatory Visit: Payer: Medicare PPO | Admitting: Physician Assistant

## 2020-09-23 ENCOUNTER — Other Ambulatory Visit: Payer: Self-pay

## 2020-09-23 VITALS — BP 102/70 | HR 80 | Ht 71.0 in | Wt 211.0 lb

## 2020-09-23 DIAGNOSIS — Q6432 Congenital stricture of urethra: Secondary | ICD-10-CM | POA: Diagnosis not present

## 2020-09-23 DIAGNOSIS — R8290 Unspecified abnormal findings in urine: Secondary | ICD-10-CM

## 2020-09-23 LAB — BLADDER SCAN AMB NON-IMAGING

## 2020-09-23 NOTE — Progress Notes (Signed)
09/23/2020 5:47 PM   Casey Peters March 14, 1946 [id number]  CC: Chief Complaint  Patient presents with  . Urethral Stricture    CIC problems    HPI: Casey Peters is a 74 y.o. 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 Junious Silk who presents today for evaluation of difficulty with CIC.    Patient has been self cathing daily with a 14Fr Coloplast catheter and notes he was unable to pass the catheter into his bladder this morning.  He failed 2 attempts and noted some blood with insertion.  He has continued to be able to void.  He notes meeting resistance near his bladder and states he has not had any difficulty passing the catheter through his stricture.  Additionally, he reports cloudy urine lately.  He is concerned for possible UTI and has provided a urine sample today.  UA notable for trace intact blood and 2+ leukocyte esterase.  Urine microscopy with >30 WBCs/hpf and moderate bacteria.  PVR 59m today.  PMH: Past Medical History:  Diagnosis Date  . BP (high blood pressure) 06/08/2014  . [place]) [place]   . Constipation   . Diabetes ([place]   . HLD (hyperlipidemia)   . HTN (hypertension)   . Rhinitis     Surgical History: Past Surgical History:  Procedure Laterality Date  . HERNIA REPAIR  1998  . PILONIDAL CYST EXCISION    . TONSILLECTOMY    . URETHRA SURGERY  1995    Home Medications:  Allergies as of 09/23/2020   No Known Allergies     Medication List       Accurate as of September 23, 2020  5:47 PM. If you have any questions, ask your nurse or doctor.        albuterol 108 (90 Base) MCG/ACT inhaler Commonly known as: VENTOLIN HFA Inhale into the lungs.   aspirin EC 81 MG tablet Take 81 mg by mouth daily.   carvedilol 3.125 MG tablet Commonly known as: COREG Take 3.125 mg by mouth 2 (two) times  daily.   fenofibrate 145 MG tablet Commonly known as: TRICOR   furosemide 40 MG tablet Commonly known as: LASIX Take 40 mg by mouth 2 (two) times daily.   metFORMIN 1000 MG tablet Commonly known as: GLUCOPHAGE Take 500 mg by mouth daily with breakfast.   ONE TOUCH ULTRA TEST test strip Generic drug: glucose blood USE 1 STRIP 3 TIMES A DAY TO CHECK BLOOD GLUCOSE LEVELS   spironolactone 25 MG tablet Commonly known as: ALDACTONE Take 25 mg by mouth daily.   testosterone 50 MG/5GM (1%) Gel Commonly known as: ANDROGEL Apply 5 g topically daily.       Allergies:  No Known Allergies  Family History: Family History  Problem Relation Age of Onset  . Hematuria Father   . Kidney disease Neg Hx   . Prostate cancer Neg Hx     Social History:   reports that he has been smoking cigars. He has never used smokeless tobacco. He reports that he does not drink alcohol and does not use drugs.  Physical Exam: BP 102/70 (BP Location: Left Arm, Patient Position: Sitting, Cuff Size: Large)   Pulse 80   Ht '5\' 11"'  (1.803 m)   Wt 211 lb (95.7 kg)   BMI 29.43 kg/m   Constitutional:  Alert and oriented, no acute distress, nontoxic appearing  HEENT: [place], AT Cardiovascular: No clubbing, cyanosis, or edema Respiratory: Normal respiratory effort, no increased work of breathing Skin: No rashes, bruises or suspicious lesions Neurologic: Grossly intact, no focal deficits, moving all 4 extremities Psychiatric: Normal mood and affect  Laboratory Data: Results for orders placed or performed in visit on 09/23/20  Microscopic Examination   Urine  Result Value Ref Range   WBC, UA >30 (A) 0 - 5 /hpf   RBC 0-2 0 - 2 /hpf   Epithelial Cells (non renal) 0-10 0 - 10 /hpf   Bacteria, UA Moderate (A) None seen/Few  Urinalysis, Complete  Result Value Ref Range   Specific Gravity, UA 1.020 1.005 - 1.030   pH, UA 5.0 5.0 - 7.5   Color, UA Yellow Yellow   Appearance Ur Cloudy (A) Clear    Leukocytes,UA 2+ (A) Negative   Protein,UA Negative Negative/Trace   Glucose, UA Negative Negative   Ketones, UA Negative Negative   RBC, UA Trace (A) Negative   Bilirubin, UA Negative Negative   Urobilinogen, Ur 0.2 0.2 - 1.0 mg/dL   Nitrite, UA Negative Negative   Microscopic Examination See below:   BLADDER SCAN AMB NON-IMAGING  Result Value Ref Range   Scan Result 12m    Assessment & Plan:   1. Congenital stricture of urethra PVR WNL today.  I attempted to sound the patient's urethra today with a 155French Coloplast straight catheter but met significant resistance at the level of the prostate.  I was subsequently able to pass a 10 FPakist[place]ilicone coud Foley catheter to the level of the bladder.  At this point, I attempted to pass a 14 [place] coud catheter and was again able to enter the patient's bladder.  160French catheters with mild to moderate resistance as they pass through the known stricture, however ultimately I was able to do this without significant difficulty.  No bleeding noted.  Minimal trauma anticipated.  I offered the patient a bag of 14 [place]oplast coud catheter samples today and counseled him to stop using the straight catheters.  He is scheduled for routine follow-up in our clinic next month and I encouraged him to keep this appointment.  We will adjust his Coloplast order moving forward. - BLADDER SCAN AMB NON-IMAGING  2. Cloudy urine I counseled the patient that cloudy urine alone is not an indicator of UTI and that there are a number of factors that can cause cloudiness in the urine.  He is noted to have pyuria and bacteriuria on microscopy today, consistent with prior.  Will send for culture for further evaluation but recommend deferring antibiotic therapy unless he develops irritative symptoms including dysuria, urgency, or frequency. - Urinalysis, Complete - CULTURE, URINE COMPREHENSIVE   Return if symptoms worsen or fail to  improve.  SDebroah Loop PA-C  [facility] [address] [place] [phone]

## 2020-09-23 NOTE — Telephone Encounter (Signed)
Patient called today stating that he does CIC daily for urethral stricture. Today he was unable to pass the catheter into his bladder. He attempted twice with out success and has noted some blood. He is able to urinate on his own. Patient is not currently in pain. He was added on to PA schedule today for further evaluation.

## 2020-09-24 LAB — URINALYSIS, COMPLETE
Bilirubin, UA: NEGATIVE
Glucose, UA: NEGATIVE
Ketones, UA: NEGATIVE
Nitrite, UA: NEGATIVE
Protein,UA: NEGATIVE
Specific Gravity, UA: 1.02 (ref 1.005–1.030)
Urobilinogen, Ur: 0.2 mg/dL (ref 0.2–1.0)
pH, UA: 5 (ref 5.0–7.5)

## 2020-09-24 LAB — MICROSCOPIC EXAMINATION: WBC, UA: >30 /hpf — AB (ref 0–5)

## 2020-09-28 LAB — CULTURE, URINE COMPREHENSIVE

## 2020-09-30 ENCOUNTER — Telehealth: Payer: Self-pay | Admitting: Physician Assistant

## 2020-09-30 NOTE — Telephone Encounter (Signed)
Spoke with patient about any infective symptoms he may be having. Patient reports seeing Dr Letitia Libra on 12/15 with an abnormal urinalysis, at which time Dr Letitia Libra decided to treat with Cephalexin 500mg . Patient reports improvement with his symptoms and will defer treatment at this time.

## 2020-09-30 NOTE — Telephone Encounter (Signed)
Please contact the patient and ask if he is having any infective symptoms: Dysuria, urgency, frequency, lower abdominal pain, low back pain, fever, chills, nausea, or vomiting. If symptomatic, please send in Bactrim DS twice daily x7 days per his recent urine culture result.  If asymptomatic, will defer treatment at this time.

## 2020-10-07 ENCOUNTER — Telehealth: Payer: Self-pay

## 2020-10-07 NOTE — Telephone Encounter (Signed)
Pt calls triage line and states that he had his PSA drawn at [facility]  clinic (see care everywhere) and his number have risen. He would like to know if you recommend having a repeat PSA drawn. Please advise.

## 2020-10-07 NOTE — Telephone Encounter (Signed)
I just spoke with the patient via telephone.  Per chart review, PSA dated 09/25/2020 was 3.63, representing a significant increase over his prior value of 1.6 in July 2020.  Notably, this PSA was drawn 2 days after I most recently saw him in clinic, when we struggled to find a CIC catheter that would advance past the level of his prostate.  Additionally, his urine culture from that visit came back positive and he was treated with antibiotics by his PCP.  I explained to the patient that multiple things can falsely elevate a PSA, including instrumentation and infection.  I recommend repeat PSA draw at his upcoming visit with Dr. Lonna Cobb in approximately 10 days to determine if his PSA is downtrending or remains stable.  Patient is in agreement with this plan.

## 2020-10-18 ENCOUNTER — Encounter: Payer: Self-pay | Admitting: Urology

## 2020-10-18 ENCOUNTER — Other Ambulatory Visit: Payer: Self-pay

## 2020-10-18 ENCOUNTER — Ambulatory Visit: Payer: Medicare PPO | Admitting: Urology

## 2020-10-18 ENCOUNTER — Telehealth: Payer: Self-pay

## 2020-10-18 VITALS — BP 114/77 | HR 109 | Ht 71.0 in | Wt 200.0 lb

## 2020-10-18 DIAGNOSIS — N138 Other obstructive and reflux uropathy: Secondary | ICD-10-CM | POA: Diagnosis not present

## 2020-10-18 DIAGNOSIS — N401 Enlarged prostate with lower urinary tract symptoms: Secondary | ICD-10-CM

## 2020-10-18 DIAGNOSIS — N35914 Unspecified anterior urethral stricture, male: Secondary | ICD-10-CM | POA: Diagnosis not present

## 2020-10-18 LAB — MICROSCOPIC EXAMINATION

## 2020-10-18 LAB — URINALYSIS, COMPLETE
Bilirubin, UA: NEGATIVE
Glucose, UA: NEGATIVE
Ketones, UA: NEGATIVE
Nitrite, UA: NEGATIVE
Protein,UA: NEGATIVE
Specific Gravity, UA: 1.02 (ref 1.005–1.030)
Urobilinogen, Ur: 0.2 mg/dL (ref 0.2–1.0)
pH, UA: 5 (ref 5.0–7.5)

## 2020-10-18 NOTE — Progress Notes (Signed)
10/18/2020 11:55 AM   Casey Peters 1945/12/09 [id number]  Referring provider: Gracelyn Nurse, MD [address] [place],  [place] [address]  Chief Complaint  Patient presents with  . Urethral Stricture    HPI: 75 y.o. male presents for urethral stricture follow-up.   Saw Dr. Mena Goes 07/26/2020 for cystoscopy which showed a 2 cm "tight area" in the penile urethra  Twice weekly intermittent catheterization was recommended  He saw S. Vaillancourt 09/23/2020 complaining of difficulty catheterizing and he was trying to advance the catheter all the way into the bladder.  She did have some difficulty advancing the catheter into his bladder.  His prostatic urethra was normal on cystoscopy.  His catheters were changed to a 36 [place] coud  He has no problems advancing the catheter through the penile urethra but states he has difficulty advancing into the bladder.  He has no voiding problems and is emptying his bladder adequately.  He is concerned about a PSA elevated above baseline.  This was drawn by his PCP 2 days after his visit in December and it was 3.63 (baseline in the 1 range)   PMH: Past Medical History:  Diagnosis Date  . BP (high blood pressure) 06/08/2014  . CHF (congestive heart failure) (HCC)   . Constipation   . Diabetes (HCC)   . HLD (hyperlipidemia)   . HTN (hypertension)   . Rhinitis     Surgical History: Past Surgical History:  Procedure Laterality Date  . HERNIA REPAIR  1998  . PILONIDAL CYST EXCISION    . TONSILLECTOMY    . URETHRA SURGERY  1995    Home Medications:  Allergies as of 10/18/2020   No Known Allergies     Medication List       Accurate as of October 18, 2020 11:59 PM. If you have any questions, ask your nurse or doctor.        albuterol 108 (90 Base) MCG/ACT inhaler Commonly known as: VENTOLIN HFA Inhale into the lungs.   aspirin EC 81 MG tablet Take 81 mg by mouth daily.   carvedilol 3.125 MG  tablet Commonly known as: COREG Take 3.125 mg by mouth 2 (two) times daily.   fenofibrate 145 MG tablet Commonly known as: TRICOR   furosemide 40 MG tablet Commonly known as: LASIX Take 40 mg by mouth 2 (two) times daily.   metFORMIN 1000 MG tablet Commonly known as: GLUCOPHAGE Take 500 mg by mouth daily with breakfast.   ONE TOUCH ULTRA TEST test strip Generic drug: glucose blood USE 1 STRIP 3 TIMES A DAY TO CHECK BLOOD GLUCOSE LEVELS   spironolactone 25 MG tablet Commonly known as: ALDACTONE Take 25 mg by mouth daily.   testosterone 50 MG/5GM (1%) Gel Commonly known as: ANDROGEL Apply 5 g topically daily.       Allergies: No Known Allergies  Family History: Family History  Problem Relation Age of Onset  . Hematuria Father   . Kidney disease Neg Hx   . Prostate cancer Neg Hx     Social History:  reports that he has been smoking cigars. He has never used smokeless tobacco. He reports that he does not drink alcohol and does not use drugs.   Physical Exam: BP 114/77   Pulse (!) 109   Ht 5\' 11"  (1.803 m)   Wt 200 lb (90.7 kg)   BMI 27.89 kg/m   Constitutional:  Alert and oriented, No acute distress. HEENT: [place] AT, moist mucus membranes.  Trachea midline, no masses. Cardiovascular: No clubbing, cyanosis, or edema. Respiratory: Normal respiratory effort, no increased work of breathing. Neurologic: Grossly intact, no focal deficits, moving all 4 extremities. Psychiatric: Normal mood and affect.   Assessment & Plan:    1.  Penile urethral stricture  Bladder scan PVR was 16 mL  He has no problems voiding or emptying his bladder and was informed he does not need to pass the catheter all the way into the bladder but just through the penile urethra  Continue twice weekly catheterization of the penile urethra  He requested a repeat PSA today however recommended a follow-up urinalysis prior to repeating the PSA.  He will be notified with the results  Follow-up  6 months or as needed   [name], MD  [facility] [address], [address] [place], [place] [address] [phone]

## 2020-10-18 NOTE — Telephone Encounter (Signed)
Pt called asking for UA results. Pt states dr Lonna Cobb said if UA is clear pt will need an appointment for PSA?Casey Peters Please clarify.

## 2020-10-19 ENCOUNTER — Encounter: Payer: Self-pay | Admitting: Urology

## 2020-10-21 ENCOUNTER — Other Ambulatory Visit: Payer: Medicare PPO

## 2020-10-21 ENCOUNTER — Other Ambulatory Visit: Payer: Self-pay | Admitting: Family Medicine

## 2020-10-21 ENCOUNTER — Other Ambulatory Visit: Payer: Self-pay

## 2020-10-21 ENCOUNTER — Telehealth: Payer: Self-pay | Admitting: Family Medicine

## 2020-10-21 DIAGNOSIS — N390 Urinary tract infection, site not specified: Secondary | ICD-10-CM

## 2020-10-21 NOTE — Telephone Encounter (Signed)
Patient notified and is scheduled to come in today for a UCX.

## 2020-10-21 NOTE — Telephone Encounter (Signed)
-----   Message from [name], MD sent at 10/20/2020 10:04 AM EST ----- Urinalysis did show pyuria.  Need a urine culture prior to repeat PSA

## 2020-10-23 LAB — CULTURE, URINE COMPREHENSIVE

## 2020-10-24 ENCOUNTER — Telehealth: Payer: Self-pay | Admitting: Family Medicine

## 2020-10-24 ENCOUNTER — Other Ambulatory Visit: Payer: Self-pay | Admitting: Urology

## 2020-10-24 MED ORDER — SULFAMETHOXAZOLE-TRIMETHOPRIM 800-160 MG PO TABS: 1.0000 | ORAL_TABLET | Freq: Two times a day (BID) | ORAL | 0 refills | Status: AC

## 2020-10-24 NOTE — Telephone Encounter (Signed)
-----   Message from [name], MD sent at 10/24/2020  7:46 AM EST ----- Urine culture was positive.  Antibiotic Rx was sent to pharmacy.  Can schedule lab visit for repeat PSA 2 weeks

## 2020-10-24 NOTE — Telephone Encounter (Signed)
Patient notified and lab appointment was changed.

## 2020-10-29 LAB — COLOGUARD: COLOGUARD: POSITIVE — AB

## 2020-10-31 ENCOUNTER — Other Ambulatory Visit: Payer: Self-pay

## 2020-11-07 ENCOUNTER — Other Ambulatory Visit: Payer: Self-pay

## 2020-11-07 ENCOUNTER — Other Ambulatory Visit: Payer: Self-pay | Admitting: *Deleted

## 2020-11-07 ENCOUNTER — Other Ambulatory Visit: Payer: Medicare PPO

## 2020-11-07 DIAGNOSIS — N401 Enlarged prostate with lower urinary tract symptoms: Secondary | ICD-10-CM

## 2020-11-07 DIAGNOSIS — N138 Other obstructive and reflux uropathy: Secondary | ICD-10-CM

## 2020-11-08 ENCOUNTER — Telehealth: Payer: Self-pay | Admitting: *Deleted

## 2020-11-08 LAB — PSA: Prostate Specific Ag, Serum: 1.9 ng/mL (ref 0.0–4.0)

## 2020-11-08 NOTE — Telephone Encounter (Signed)
Notified patient as instructed, patient pleased. Discussed follow-up appointments, patient agrees  

## 2020-11-08 NOTE — Telephone Encounter (Signed)
-----   Message from Harle Battiest, PA-C sent at 11/08/2020  8:28 AM EST ----- Please let Mr. Brobeck know that his PSA is 1.9.  Dr. Lonna Cobb would like to see him again in 6 months.

## 2020-12-04 ENCOUNTER — Telehealth: Payer: Self-pay | Admitting: *Deleted

## 2020-12-04 NOTE — Telephone Encounter (Signed)
Patient called triage line to discuss symptoms-patient has been having dysuria for a couple of days, denies any other symptoms. Denies pain, fevers, body aches. Offered same day appointment-declined prefers to come in next available. Aware to seek medical attention if worsens. Scheduled appointment for tomorrow. Voiced understanding.

## 2020-12-05 ENCOUNTER — Ambulatory Visit: Payer: Self-pay | Admitting: Physician Assistant

## 2020-12-13 ENCOUNTER — Ambulatory Visit: Payer: Self-pay | Admitting: Urology

## 2020-12-24 ENCOUNTER — Other Ambulatory Visit: Payer: Self-pay

## 2020-12-24 ENCOUNTER — Ambulatory Visit
Admission: EM | Admit: 2020-12-24 | Discharge: 2020-12-24 | Disposition: A | Discharge status: Home / Self Care | Payer: Medicare PPO | Attending: Family Medicine | Admitting: Family Medicine

## 2020-12-24 DIAGNOSIS — J0110 Acute frontal sinusitis, unspecified: Secondary | ICD-10-CM | POA: Diagnosis not present

## 2020-12-24 MED ORDER — AMOXICILLIN-POT CLAVULANATE 875-125 MG PO TABS: 1.0000 | ORAL_TABLET | Freq: Two times a day (BID) | ORAL | 0 refills | Status: DC

## 2020-12-24 NOTE — ED Triage Notes (Signed)
Patient presents to Urgent Care with complaints of cough, nasal congestion, eye, and facial pressure  x  Week.   Denies fever, n/v, or diarrhea.

## 2020-12-24 NOTE — ED Provider Notes (Signed)
Casey Peters    CSN: [id number] Arrival date & time: 12/24/20  1104      History   Chief Complaint Chief Complaint  Patient presents with  . Cough  . Nasal Congestion    HPI Casey Peters is a 75 y.o. male.   Patient is a 75 year old male presents today with approximate 1 week or more of worsening nasal congestion, nasal mucus, sinus pressure, headache, dizziness.  Symptoms have been constant.  He has not taken any medicine for symptoms. Denies any hx of allergies.     Cough   Past Medical History:  Diagnosis Date  . BP (high blood pressure) 06/08/2014  . CHF (congestive heart failure) (HCC)   . Constipation   . Diabetes (HCC)   . HLD (hyperlipidemia)   . HTN (hypertension)   . Rhinitis     Patient Active Problem List   Diagnosis Date Noted  . Palpitations 02/08/2020  . 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 Pain of finger 06/29/2019  . Chronic systolic heart failure (HCC) 10/25/2018  . Tobacco use 10/25/2018  . Rising PSA level 07/10/2015  . Microscopic hematuria 07/10/2015  . Type 2 diabetes mellitus (HCC) 06/08/2014  . HLD (hyperlipidemia) 06/08/2014  . BP (high blood pressure) 06/08/2014  . Obesity, diabetes, and hypertension syndrome (HCC) 06/08/2014  . Adiposity 06/08/2014  . Controlled type 2 diabetes mellitus without complication (HCC) 06/08/2014    Past Surgical History:  Procedure Laterality Date  . HERNIA REPAIR  1998  . PILONIDAL CYST EXCISION    . TONSILLECTOMY    . URETHRA SURGERY  1995       Home Medications    Prior to Admission medications   Medication Sig Start Date End Date Taking? Authorizing Provider  amoxicillin-clavulanate (AUGMENTIN) 875-125 MG tablet Take 1 tablet by mouth every 12 (twelve) hours. 12/24/20  Yes Rushawn Capshaw A, NP  albuterol (VENTOLIN HFA) 108 (90 Base) MCG/ACT inhaler Inhale into the lungs. 08/13/20 08/13/21  [provider]  aspirin EC 81 MG tablet Take 81 mg by mouth daily.    [provider]  carvedilol (COREG) 3.125 MG tablet Take 3.125 mg by mouth 2 (two) times daily. 05/19/18   [provider]  fenofibrate (TRICOR) 145 MG tablet  06/21/20   [provider]  furosemide (LASIX) 40 MG tablet Take 40 mg by mouth 2 (two) times daily. 06/07/18   [provider]  metFORMIN (GLUCOPHAGE) 1000 MG tablet Take 500 mg by mouth daily with breakfast.     [provider]  ONE TOUCH ULTRA TEST test strip USE 1 STRIP 3 TIMES A DAY TO CHECK BLOOD GLUCOSE LEVELS 09/24/18   [provider]  spironolactone (ALDACTONE) 25 MG tablet Take 25 mg by mouth daily. 02/21/18 02/21/19  [provider]  testosterone (ANDROGEL) 50 MG/5GM (1%) GEL Apply 5 g topically daily. 04/26/18   [provider]    Family History Family History  Problem Relation Age of Onset  . Hematuria Father   . Kidney disease Neg Hx   . Prostate cancer Neg Hx     Social History Social History   Tobacco Use  . Smoking status: Current Every Day Smoker    Types: Cigars  . Smokeless tobacco: Never Used  . Tobacco comment: 2-3 cigars per day  Substance Use Topics  . Alcohol use: No    Alcohol/week: 0.0 standard drinks  . Drug use: No     Allergies   Patient has no known allergies.  Review of Systems Review of Systems  Respiratory: Positive for cough.      Physical Exam Triage Vital Signs ED Triage Vitals  Enc Vitals Group     BP 12/24/20 1111 126/74     Pulse Rate 12/24/20 1111 87     Resp 12/24/20 1111 18     Temp 12/24/20 1111 98 F (36.7 C)     Temp Source 12/24/20 1111 Oral     SpO2 12/24/20 1111 97 %     Weight --      Height --      Head Circumference --      Peak Flow --      Pain Score 12/24/20 1128 0     Pain Loc --      Pain Edu? --      Excl. in GC? --    No data found.  Updated Vital Signs BP 126/74 (BP Location: Left Arm)   Pulse 87   Temp 98 F (36.7 C) (Oral)   Resp 18   SpO2 97%   Visual Acuity Right Eye  Distance:   Left Eye Distance:   Bilateral Distance:    Right Eye Near:   Left Eye Near:    Bilateral Near:     Physical Exam Vitals and nursing note reviewed.  Constitutional:      General: He is not in acute distress.    Appearance: Normal appearance. He is not ill-appearing, toxic-appearing or diaphoretic.  HENT:     Head: Normocephalic and atraumatic.     Right Ear: Tympanic membrane and ear canal normal.     Left Ear: Tympanic membrane and ear canal normal.     Nose: Nose normal.     Mouth/Throat:     Pharynx: Oropharynx is clear.  Eyes:     Conjunctiva/sclera: Conjunctivae normal.  Cardiovascular:     Rate and Rhythm: Normal rate and regular rhythm.  Pulmonary:     Effort: Pulmonary effort is normal.     Breath sounds: Normal breath sounds.  Musculoskeletal:        General: Normal range of motion.     Cervical back: Normal range of motion.  Skin:    General: Skin is warm and dry.  Neurological:     Mental Status: He is alert.  Psychiatric:        Mood and Affect: Mood normal.      UC Treatments / Results  Labs (all labs ordered are listed, but only abnormal results are displayed) Labs Reviewed - No data to display  EKG   Radiology No results found.  Procedures Procedures (including critical care time)  Medications Ordered in UC Medications - No data to display  Initial Impression / Assessment and Plan / UC Course  I have reviewed the triage vital signs and the nursing notes.  Pertinent labs & imaging results that were available during my care of the patient were reviewed by me and considered in my medical decision making (see chart for details).     Sinusitis Treating for sinus infection, bacterial based on symptoms and length.  Treating with amoxicillin.  Recommended Flonase and zyrtec.  Follow up as needed for continued or worsening symptoms  Final Clinical Impressions(s) / UC Diagnoses   Final diagnoses:  Acute non-recurrent frontal  sinusitis     Discharge Instructions     Treating you for a sinus infection Take the medicine as prescribed You can take Flonase and zyrtec over the counter for symptoms.  Follow up as needed for continued or worsening symptoms     ED Prescriptions    Medication Sig Dispense Auth. Provider   amoxicillin-clavulanate (AUGMENTIN) 875-125 MG tablet Take 1 tablet by mouth every 12 (twelve) hours. 14 tablet Quadre Bristol A, NP     PDMP not reviewed this encounter.   Janace Aris, NP 12/24/20 1144

## 2020-12-24 NOTE — Discharge Instructions (Addendum)
Treating you for a sinus infection Take the medicine as prescribed You can take Flonase and zyrtec over the counter for symptoms.  Follow up as needed for continued or worsening symptoms

## 2021-01-24 ENCOUNTER — Other Ambulatory Visit: Payer: Self-pay

## 2021-01-24 ENCOUNTER — Ambulatory Visit
Admission: EM | Admit: 2021-01-24 | Discharge: 2021-01-24 | Disposition: A | Discharge status: Home / Self Care | Payer: Medicare PPO | Attending: Emergency Medicine | Admitting: Emergency Medicine

## 2021-01-24 ENCOUNTER — Encounter: Payer: Self-pay | Admitting: Emergency Medicine

## 2021-01-24 DIAGNOSIS — R1030 Lower abdominal pain, unspecified: Secondary | ICD-10-CM | POA: Insufficient documentation

## 2021-01-24 DIAGNOSIS — R300 Dysuria: Secondary | ICD-10-CM | POA: Insufficient documentation

## 2021-01-24 LAB — POCT URINALYSIS DIP (MANUAL ENTRY)
Bilirubin, UA: NEGATIVE
Glucose, UA: NEGATIVE mg/dL
Ketones, POC UA: NEGATIVE mg/dL
Nitrite, UA: NEGATIVE
Protein Ur, POC: NEGATIVE mg/dL
Spec Grav, UA: 1.025 (ref 1.010–1.025)
Urobilinogen, UA: 0.2 E.U./dL
pH, UA: 5.5 (ref 5.0–8.0)

## 2021-01-24 MED ORDER — SULFAMETHOXAZOLE-TRIMETHOPRIM 800-160 MG PO TABS: 1.0000 | ORAL_TABLET | Freq: Two times a day (BID) | ORAL | 0 refills | Status: AC

## 2021-01-24 NOTE — Discharge Instructions (Signed)
Take the antibiotic as directed.  The urine culture is pending.  We will call you if it shows the need to change or discontinue your antibiotic.    Schedule a follow-up appointment with your primary care provider or urologist next week.    Go to the emergency department if you have acute worsening symptoms.

## 2021-01-24 NOTE — ED Provider Notes (Signed)
Casey Peters    CSN: [id number] Arrival date & time: 01/24/21  1225      History   Chief Complaint Chief Complaint  Patient presents with  . Dysuria  . Urinary Frequency    HPI Casey Peters is a 75 y.o. male.   Patient presents with dysuria, urinary frequency, suprapubic pressure x1 day.  He denies fever, flank pain, hematuria, penile discharge, testicular pain, or other symptoms.  No treatments attempted at home.  Patient was seen here on 12/24/2020; diagnosed with acute sinusitis; treated with Augmentin.  His medical history includes diabetes, hypertension, congestive heart failure, obesity.  Patient is followed by urology for BPH.  The history is provided by the patient and medical records.    Past Medical History:  Diagnosis Date  . BP (high blood pressure) 06/08/2014  . CHF (congestive heart failure) (HCC)   . Constipation   . Diabetes (HCC)   . HLD (hyperlipidemia)   . HTN (hypertension)   . Rhinitis     Patient Active Problem List   Diagnosis Date Noted  . Palpitations 02/08/2020  . 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 Pain of finger 06/29/2019  . Chronic systolic heart failure (HCC) 10/25/2018  . Tobacco use 10/25/2018  . Rising PSA level 07/10/2015  . Microscopic hematuria 07/10/2015  . Type 2 diabetes mellitus (HCC) 06/08/2014  . HLD (hyperlipidemia) 06/08/2014  . BP (high blood pressure) 06/08/2014  . Obesity, diabetes, and hypertension syndrome (HCC) 06/08/2014  . Adiposity 06/08/2014  . Controlled type 2 diabetes mellitus without complication (HCC) 06/08/2014    Past Surgical History:  Procedure Laterality Date  . HERNIA REPAIR  1998  . PILONIDAL CYST EXCISION    . TONSILLECTOMY    . URETHRA SURGERY  1995       Home Medications    Prior to Admission medications   Medication Sig Start Date End Date Taking? Authorizing Provider  aspirin EC 81 MG tablet Take 81 mg by mouth daily.   Yes [provider]  carvedilol (COREG)  3.125 MG tablet Take 3.125 mg by mouth 2 (two) times daily. 05/19/18  Yes [provider]  furosemide (LASIX) 40 MG tablet Take 40 mg by mouth 2 (two) times daily. 06/07/18  Yes [provider]  metFORMIN (GLUCOPHAGE) 1000 MG tablet Take 500 mg by mouth daily with breakfast.    Yes [provider]  sulfamethoxazole-trimethoprim (BACTRIM DS) 800-160 MG tablet Take 1 tablet by mouth 2 (two) times daily for 7 days. 01/24/21 01/31/21 Yes Mickie Bail, NP  testosterone (ANDROGEL) 50 MG/5GM (1%) GEL Apply 5 g topically daily. 04/26/18  Yes [provider]  albuterol (VENTOLIN HFA) 108 (90 Base) MCG/ACT inhaler Inhale into the lungs. 08/13/20 08/13/21  [provider]  fenofibrate (TRICOR) 145 MG tablet  06/21/20   [provider]  ONE TOUCH ULTRA TEST test strip USE 1 STRIP 3 TIMES A DAY TO CHECK BLOOD GLUCOSE LEVELS 09/24/18   [provider]  spironolactone (ALDACTONE) 25 MG tablet Take 25 mg by mouth daily. 02/21/18 02/21/19  [provider]    Family History Family History  Problem Relation Age of Onset  . Hematuria Father   . Kidney disease Neg Hx   . Prostate cancer Neg Hx     Social History Social History   Tobacco Use  . Smoking status: Current Every Day Smoker    Types: Cigars  . Smokeless tobacco: Never Used  . Tobacco comment: 2-3 cigars per day  Substance Use Topics  . Alcohol use: No    Alcohol/week: 0.0 standard drinks  . Drug use: No     Allergies   Patient has no known allergies.   Review of Systems Review of Systems  Constitutional: Negative for chills and fever.  HENT: Negative for ear pain and sore throat.   Eyes: Negative for pain and visual disturbance.  Respiratory: Negative for cough and shortness of breath.   Cardiovascular: Negative for chest pain and palpitations.  Gastrointestinal: Positive for abdominal pain. Negative for diarrhea and vomiting.  Genitourinary: Positive for dysuria and  frequency. Negative for hematuria.  Musculoskeletal: Negative for arthralgias and back pain.  Skin: Negative for color change and rash.  Neurological: Negative for seizures and syncope.  All other systems reviewed and are negative.    Physical Exam Triage Vital Signs ED Triage Vitals  Enc Vitals Group     BP      Pulse      Resp      Temp      Temp src      SpO2      Weight      Height      Head Circumference      Peak Flow      Pain Score      Pain Loc      Pain Edu?      Excl. in GC?    No data found.  Updated Vital Signs BP 126/79 (BP Location: Left Arm)   Pulse 81   Temp 98.6 F (37 C) (Oral)   Resp 18   SpO2 96%   Visual Acuity Right Eye Distance:   Left Eye Distance:   Bilateral Distance:    Right Eye Near:   Left Eye Near:    Bilateral Near:     Physical Exam Vitals and nursing note reviewed.  Constitutional:      Appearance: He is well-developed.  HENT:     Head: Normocephalic and atraumatic.     Mouth/Throat:     Mouth: Mucous membranes are moist.  Eyes:     Conjunctiva/sclera: Conjunctivae normal.  Cardiovascular:     Rate and Rhythm: Normal rate and regular rhythm.     Heart sounds: Normal heart sounds.  Pulmonary:     Effort: Pulmonary effort is normal. No respiratory distress.     Breath sounds: Normal breath sounds.  Abdominal:     Palpations: Abdomen is soft.     Tenderness: There is no abdominal tenderness. There is no right CVA tenderness, left CVA tenderness, guarding or rebound.  Musculoskeletal:     Cervical back: Neck supple.  Skin:    General: Skin is warm and dry.  Neurological:     General: No focal deficit present.     Mental Status: He is alert and oriented to person, place, and time.     Gait: Gait normal.  Psychiatric:        Mood and Affect: Mood normal.        Behavior: Behavior normal.      UC Treatments / Results  Labs (all labs ordered are listed, but only abnormal results are displayed) Labs Reviewed   POCT URINALYSIS DIP (MANUAL ENTRY) - Abnormal; Notable for the following components:      Result Value   Clarity, UA cloudy (*)    Blood, UA trace-intact (*)    Leukocytes, UA Moderate (2+) (*)    All other components within normal limits  URINE CULTURE  EKG   Radiology No results found.  Procedures Procedures (including critical care time)  Medications Ordered in UC Medications - No data to display  Initial Impression / Assessment and Plan / UC Course  I have reviewed the triage vital signs and the nursing notes.  Pertinent labs & imaging results that were available during my care of the patient were reviewed by me and considered in my medical decision making (see chart for details).   Dysuria, lower abdominal pain.  Patient is well-appearing, his exam is reassuring, his vital signs are stable, afebrile.  Urine culture pending.  Treating with Septra DS.  Instructed patient to follow-up with his PCP or urologist next week.  Instructed him to go to the ED if he has acute worsening symptoms.  He agrees to plan of care.   Final Clinical Impressions(s) / UC Diagnoses   Final diagnoses:  Dysuria  Lower abdominal pain     Discharge Instructions     Take the antibiotic as directed.  The urine culture is pending.  We will call you if it shows the need to change or discontinue your antibiotic.    Schedule a follow-up appointment with your primary care provider or urologist next week.    Go to the emergency department if you have acute worsening symptoms.          ED Prescriptions    Medication Sig Dispense Auth. Provider   sulfamethoxazole-trimethoprim (BACTRIM DS) 800-160 MG tablet Take 1 tablet by mouth 2 (two) times daily for 7 days. 14 tablet Mickie Bail, NP     PDMP not reviewed this encounter.   Mickie Bail, NP 01/24/21 1302

## 2021-01-24 NOTE — ED Triage Notes (Addendum)
Patient c/o dysuria and increased urinary frequency x 1 day.   Patient denies foul smell, back pain, penile discharge, testicular pain, and hematuria.   Patient endorses "some abdominal pain with pressure". Patient endorses burning with urination.   Patient hasn't used any medications for symptoms.

## 2021-01-26 LAB — URINE CULTURE: Culture: 30000 — AB

## 2021-03-11 ENCOUNTER — Other Ambulatory Visit: Payer: Self-pay

## 2021-03-11 ENCOUNTER — Encounter: Payer: Self-pay | Admitting: Physician Assistant

## 2021-03-11 ENCOUNTER — Ambulatory Visit (INDEPENDENT_AMBULATORY_CARE_PROVIDER_SITE_OTHER): Payer: Medicare PPO | Admitting: Physician Assistant

## 2021-03-11 VITALS — BP 102/68 | HR 93 | Temp 98.1°F | Ht 71.0 in | Wt 203.0 lb

## 2021-03-11 DIAGNOSIS — R1030 Lower abdominal pain, unspecified: Secondary | ICD-10-CM | POA: Diagnosis not present

## 2021-03-11 LAB — BLADDER SCAN AMB NON-IMAGING: Scan Result: 37

## 2021-03-11 MED ORDER — SULFAMETHOXAZOLE-TRIMETHOPRIM 800-160 MG PO TABS: 1.0000 | ORAL_TABLET | Freq: Two times a day (BID) | ORAL | 0 refills | Status: AC

## 2021-03-11 NOTE — Progress Notes (Signed)
03/11/2021 1:39 PM   Casey Peters 1946-06-28 [id number]  CC: Chief Complaint  Patient presents with  . Abdominal Pain    HPI: Casey Peters is a 75 y.o. 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   Today he reports a 4-day history of low belly and back pain.  He had a fever yesterday of 101F that spontaneously resolved.  He denies nausea, vomiting, gross hematuria, dysuria, cough, chest pain, congestion, body aches, and sick contacts.  He also reports concerns with increased resistance met with self-catheterization.  He has grown Klebsiella pneumoniae on urine culture 3 times in the last 6 months and states he is unsure of his urinary symptoms have improved after taking culture appropriate antibiotics.  Additionally, he requests a standing antibiotic prescription for management of UTI.  He also requests a PSA, stating he is concerned for prostate cancer and believes his symptoms are associated with his prostate.  In-office UA today positive for trace lysed blood and 1+ leukocyte esterase; urine microscopy with 11-30 WBCs/HPF.  PMH: Past Medical History:  Diagnosis Date  . BP (high blood pressure) 06/08/2014  . CHF (congestive heart failure) ([place])   . Constipation   . Diabetes ([place])   . HLD (hyperlipidemia)   . HTN (hypertension)   . Rhinitis     Surgical History: Past Surgical History:  Procedure Laterality Date  . HERNIA REPAIR  1998  . PILONIDAL CYST EXCISION    . TONSILLECTOMY    . URETHRA SURGERY  1995    Home Medications:  Allergies as of 03/11/2021   No Known Allergies     Medication List       Accurate as of Mar 11, 2021  1:39 PM. If you have any questions, ask your nurse or doctor.        albuterol 108 (90 Base) MCG/ACT inhaler Commonly known as: VENTOLIN  HFA Inhale into the lungs.   aspirin EC 81 MG tablet Take 81 mg by mouth daily.   carvedilol 3.125 MG tablet Commonly known as: COREG Take 3.125 mg by mouth 2 (two) times daily.   fenofibrate 145 MG tablet Commonly known as: TRICOR   furosemide 40 MG tablet Commonly known as: LASIX Take 40 mg by mouth 2 (two) times daily.   metFORMIN 1000 MG tablet Commonly known as: GLUCOPHAGE Take 500 mg by mouth daily with breakfast.   ONE TOUCH ULTRA TEST test strip Generic drug: glucose blood USE 1 STRIP 3 TIMES A DAY TO CHECK BLOOD GLUCOSE LEVELS   spironolactone 25 MG tablet Commonly known as: ALDACTONE Take 25 mg by mouth daily.   testosterone 50 MG/5GM (1%) Gel Commonly known as: ANDROGEL Apply 5 g topically daily.       Allergies:  No Known Allergies  Family History: Family History  Problem Relation Age of Onset  . Hematuria Father   . Kidney disease Neg Hx   . Prostate cancer Neg Hx     Social History:   reports that he has been smoking cigars. He has never used smokeless tobacco. He reports that he does not drink alcohol and does not use drugs.  Physical Exam: BP 102/68   Pulse 93   Temp 98.1 F (36.7 C) (Oral)   Ht '5\' 11"'  (1.803 m)   Wt 203 lb (92.1 kg)   BMI 28.31  kg/m   Constitutional:  Alert and oriented, no acute distress, nontoxic appearing HEENT: [place], AT Cardiovascular: No clubbing, cyanosis, or edema Respiratory: Normal respiratory effort, no increased work of breathing Skin: No rashes, bruises or suspicious lesions Neurologic: Grossly intact, no focal deficits, moving all 4 extremities Psychiatric: Normal mood and affect  Laboratory Data: Results for orders placed or performed in visit on 03/11/21  Microscopic Examination   Urine  Result Value Ref Range   WBC, UA 11-30 (A) 0 - 5 /hpf   RBC 0-2 0 - 2 /hpf   Epithelial Cells (non renal) 0-10 0 - 10 /hpf   Bacteria, UA None seen None seen/Few  Urinalysis, Complete  Result Value Ref Range    Specific Gravity, UA 1.020 1.005 - 1.030   pH, UA 5.0 5.0 - 7.5   Color, UA Yellow Yellow   Appearance Ur Clear Clear   Leukocytes,UA 1+ (A) Negative   Protein,UA Negative Negative/Trace   Glucose, UA Negative Negative   Ketones, UA Negative Negative   RBC, UA Trace (A) Negative   Bilirubin, UA Negative Negative   Urobilinogen, Ur 0.2 0.2 - 1.0 mg/dL   Nitrite, UA Negative Negative   Microscopic Examination See below:   Bladder Scan (Post Void Residual) in office  Result Value Ref Range   Scan Result 37    Assessment & Plan:   1. Lower abdominal pain Lower abdominal pain associated with fever, though afebrile today.  PVR WNL and UA notable for pyuria.  Will send for culture for further evaluation and start empiric Bactrim.  Recurrent Klebsiella positive urine cultures are consistent with recurrent Klebsiella UTI versus urinary colonization versus chronic prostatitis.  With his isolated lower abdomen and low back pain today, I recommend starting an extended course of Bactrim DS twice daily x14 days with plans for cystoscopy TRUS with Dr. Bernardo Heater for further evaluation.  May consider extending Bactrim longer based on these findings.  I explained that our clinic does not do standing antibiotic prescriptions and that we may consider PSA in the future, however we do not draw this if there is evidence of active infection due to concerns for falsely elevated result.  He expressed understanding.  We discussed return precautions including fever, chills, nausea, and vomiting. - Urinalysis, Complete - Bladder Scan (Post Void Residual) in office - CULTURE, URINE COMPREHENSIVE - sulfamethoxazole-trimethoprim (BACTRIM DS) 800-160 MG tablet; Take 1 tablet by mouth 2 (two) times daily for 14 days.  Dispense: 28 tablet; Refill: 0   Return in about 1 week (around 03/18/2021) for Cysto and TRUS with Dr. Bernardo Heater.  Debroah Loop, PA-C  [facility] [address]  [place], [place] [address] [phone]

## 2021-03-12 LAB — MICROSCOPIC EXAMINATION: Bacteria, UA: NONE SEEN

## 2021-03-12 LAB — URINALYSIS, COMPLETE
Bilirubin, UA: NEGATIVE
Glucose, UA: NEGATIVE
Ketones, UA: NEGATIVE
Nitrite, UA: NEGATIVE
Protein,UA: NEGATIVE
Specific Gravity, UA: 1.02 (ref 1.005–1.030)
Urobilinogen, Ur: 0.2 mg/dL (ref 0.2–1.0)
pH, UA: 5 (ref 5.0–7.5)

## 2021-03-13 LAB — CULTURE, URINE COMPREHENSIVE

## 2021-03-21 ENCOUNTER — Ambulatory Visit (INDEPENDENT_AMBULATORY_CARE_PROVIDER_SITE_OTHER): Payer: Medicare PPO | Admitting: Urology

## 2021-03-21 ENCOUNTER — Other Ambulatory Visit: Payer: Self-pay

## 2021-03-21 ENCOUNTER — Encounter: Payer: Self-pay | Admitting: Urology

## 2021-03-21 VITALS — BP 104/63 | HR 83 | Ht 73.0 in | Wt 201.0 lb

## 2021-03-21 DIAGNOSIS — N35914 Unspecified anterior urethral stricture, male: Secondary | ICD-10-CM

## 2021-03-21 DIAGNOSIS — N138 Other obstructive and reflux uropathy: Secondary | ICD-10-CM | POA: Diagnosis not present

## 2021-03-21 DIAGNOSIS — N401 Enlarged prostate with lower urinary tract symptoms: Secondary | ICD-10-CM | POA: Diagnosis not present

## 2021-03-21 LAB — URINALYSIS, COMPLETE
Bilirubin, UA: NEGATIVE
Glucose, UA: NEGATIVE
Ketones, UA: NEGATIVE
Nitrite, UA: NEGATIVE
Protein,UA: NEGATIVE
Specific Gravity, UA: 1.02 (ref 1.005–1.030)
Urobilinogen, Ur: 0.2 mg/dL (ref 0.2–1.0)
pH, UA: 5.5 (ref 5.0–7.5)

## 2021-03-21 LAB — MICROSCOPIC EXAMINATION: Bacteria, UA: NONE SEEN

## 2021-03-21 NOTE — Progress Notes (Signed)
03/21/21   HPI: Refer to [name]'s note of 03/11/2021.   There were no vitals taken for this visit. NED. A&Ox3.   No respiratory distress   Abd soft, NT, ND Normal phallus with bilateral descended testicles    Cystoscopy Procedure Note  Patient identification was confirmed, informed consent was obtained, and patient was prepped using Betadine solution.  Lidocaine jelly was administered per urethral meatus.    Preoperative abx where received prior to procedure.     Pre-Procedure: - Inspection reveals a normal caliber urethral meatus.  Procedure: The flexible cystoscope was introduced without difficulty -Narrowing of the anterior urethra diffusely with squamous metaplasia distally -  Mild to moderate lateral lobe enlargement  prostate  - Normal bladder neck - Bilateral ureteral orifices identified - Bladder mucosa  reveals no ulcers, tumors, or lesions - No bladder stones -Mild trabeculation  Retroflexion shows no abnormalities   Post-Procedure: - Patient tolerated the procedure well    Prostate transrectal ultrasound sizing   Informed consent was obtained after discussing risks/benefits of the procedure.  A time out was performed to ensure correct patient identity.   Pre-Procedure: -Transrectal probe was placed without difficulty -Transrectal Ultrasound performed revealing a 41 gm prostate measuring 2.98 x 5.0 to x 5.26 cm (length) -No significant hypoechoic or median lobe noted     Assessment/ Plan: Mild to moderate prostate enlargement on cystoscopy Would not recommend an outlet procedure due to possible worsening of his stricture disease unless it was a procedure that could be performed with a small caliber cystoscope such as UroLift Continue intermittent catheterization I offered him an appointment with Dr. Guy Sandifer at [facility] for second opinion regarding any other management options however he wants to hold off for now Follow-up 6 months or earlier for any  problems    [name], MD

## 2021-03-27 ENCOUNTER — Other Ambulatory Visit: Payer: Self-pay

## 2021-03-27 ENCOUNTER — Other Ambulatory Visit: Payer: Medicare PPO

## 2021-03-27 DIAGNOSIS — N138 Other obstructive and reflux uropathy: Secondary | ICD-10-CM

## 2021-03-27 DIAGNOSIS — N401 Enlarged prostate with lower urinary tract symptoms: Secondary | ICD-10-CM

## 2021-03-28 LAB — PSA: Prostate Specific Ag, Serum: 4.1 ng/mL — ABNORMAL HIGH (ref 0.0–4.0)

## 2021-04-02 ENCOUNTER — Other Ambulatory Visit: Payer: Self-pay

## 2021-04-02 ENCOUNTER — Ambulatory Visit (INDEPENDENT_AMBULATORY_CARE_PROVIDER_SITE_OTHER): Payer: Medicare PPO | Admitting: Urology

## 2021-04-02 ENCOUNTER — Encounter: Payer: Self-pay | Admitting: Urology

## 2021-04-02 VITALS — BP 91/57 | HR 97 | Ht 71.0 in | Wt 202.0 lb

## 2021-04-02 DIAGNOSIS — N390 Urinary tract infection, site not specified: Secondary | ICD-10-CM | POA: Diagnosis not present

## 2021-04-02 DIAGNOSIS — N138 Other obstructive and reflux uropathy: Secondary | ICD-10-CM | POA: Diagnosis not present

## 2021-04-02 DIAGNOSIS — R972 Elevated prostate specific antigen [PSA]: Secondary | ICD-10-CM

## 2021-04-02 DIAGNOSIS — N401 Enlarged prostate with lower urinary tract symptoms: Secondary | ICD-10-CM

## 2021-04-02 MED ORDER — TAMSULOSIN HCL 0.4 MG PO CAPS: 0.4000 mg | ORAL_CAPSULE | Freq: Every day | ORAL | 0 refills | Status: DC

## 2021-04-03 ENCOUNTER — Encounter: Payer: Self-pay | Admitting: Urology

## 2021-04-03 LAB — URINALYSIS, COMPLETE
Bilirubin, UA: NEGATIVE
Glucose, UA: NEGATIVE
Ketones, UA: NEGATIVE
Nitrite, UA: NEGATIVE
Protein,UA: NEGATIVE
Specific Gravity, UA: 1.02 (ref 1.005–1.030)
Urobilinogen, Ur: 0.2 mg/dL (ref 0.2–1.0)
pH, UA: 5 (ref 5.0–7.5)

## 2021-04-03 LAB — MICROSCOPIC EXAMINATION: WBC, UA: >30 /hpf — ABNORMAL HIGH (ref 0–5)

## 2021-04-03 NOTE — Progress Notes (Signed)
04/02/2021 7:31 AM   Casey Peters 1946/07/08 [id number]  Referring provider: Gracelyn Nurse, MD [address] [place],  [place] [address]  Chief Complaint  Patient presents with   Benign Prostatic Hypertrophy    HPI: 75 y.o. male recently underwent cystoscopy for history urethral stricture disease and recurrent UTI.  History of transient PSA elevation with baseline PSA and 1-2 range PSA December 2021 was 3.63 which was repeated and back to baseline at 1.9 in January 2022 He states he requested a PSA from his PCP recently because of lower abdominal pain and the PSA was elevated at 4.1 Baseline voiding symptoms frequency, urgency and decreased stream on CIC   PMH: Past Medical History:  Diagnosis Date   BP (high blood pressure) 06/08/2014   CHF (congestive heart failure) (HCC)    Constipation    Diabetes (HCC)    HLD (hyperlipidemia)    HTN (hypertension)    Rhinitis     Surgical History: Past Surgical History:  Procedure Laterality Date   HERNIA REPAIR  1998   PILONIDAL CYST EXCISION     TONSILLECTOMY     URETHRA SURGERY  1995    Home Medications:  Allergies as of 04/02/2021   No Known Allergies      Medication List        Accurate as of April 02, 2021 11:59 PM. If you have any questions, ask your nurse or doctor.          aspirin EC 81 MG tablet Take 81 mg by mouth daily.   carvedilol 3.125 MG tablet Commonly known as: COREG Take 3.125 mg by mouth 2 (two) times daily.   fenofibrate 145 MG tablet Commonly known as: TRICOR   furosemide 40 MG tablet Commonly known as: LASIX Take 40 mg by mouth 2 (two) times daily.   metFORMIN 1000 MG tablet Commonly known as: GLUCOPHAGE Take 500 mg by mouth daily with breakfast.   ONE TOUCH ULTRA TEST test strip Generic drug: glucose blood USE 1 STRIP 3 TIMES A DAY TO CHECK BLOOD GLUCOSE LEVELS   simvastatin 10 MG tablet Commonly known as: ZOCOR Take 10 mg by mouth at bedtime.    spironolactone 25 MG tablet Commonly known as: ALDACTONE Take 25 mg by mouth daily.   tamsulosin 0.4 MG Caps capsule Commonly known as: FLOMAX Take 1 capsule (0.4 mg total) by mouth daily. Started by: [name], MD   testosterone 50 MG/5GM (1%) Gel Commonly known as: ANDROGEL Apply 5 g topically daily.        Allergies: No Known Allergies  Family History: Family History  Problem Relation Age of Onset   Hematuria Father    Kidney disease Neg Hx    Prostate cancer Neg Hx     Social History:  reports that he has been smoking cigars. He has never used smokeless tobacco. He reports that he does not drink alcohol and does not use drugs.   Physical Exam: BP (!) 91/57   Pulse 97   Ht 5\' 11"  (1.803 m)   Wt 202 lb (91.6 kg)   BMI 28.17 kg/m   Constitutional:  Alert and oriented, No acute distress. HEENT: [place] AT, moist mucus membranes.  Trachea midline, no masses. Cardiovascular: No clubbing, cyanosis, or edema. Respiratory: Normal respiratory effort, no increased work of breathing. GU: Prostate 60 g, smooth without nodules Skin: No rashes, bruises or suspicious lesions. Neurologic: Grossly intact, no focal deficits, moving all 4 extremities. Psychiatric: Normal mood and  affect.  Laboratory Data:  Urinalysis Dipstick trace blood/2+ leukocytes Microscopy >30 WBC/moderate bacteria  Assessment & Plan:    1.  Elevated PSA Discussed with patient that lower abdominal pain would not be a sign of prostate cancer and that PSA would not be indicated for this symptom Would not recommend checking PSAs unless he has first had a urinalysis due to history recurrent UTIs Would defer any request for PSA to urology Recheck PSA 1 month after a trial of tamsulosin and antibiotic treatment as below  2. BPH with obstruction/lower urinary tract symptoms Tamsulosin 0.4 mg daily x30 days  3.  Recurrent UTI Urinalysis with pyuria Urine culture ordered Await culture results prior  to treatment    [name], MD  [facility] [address] [place], [place] [address] [phone]

## 2021-05-02 ENCOUNTER — Other Ambulatory Visit: Payer: Medicare PPO

## 2021-05-02 ENCOUNTER — Other Ambulatory Visit: Payer: Self-pay

## 2021-05-02 DIAGNOSIS — N138 Other obstructive and reflux uropathy: Secondary | ICD-10-CM

## 2021-05-02 DIAGNOSIS — N401 Enlarged prostate with lower urinary tract symptoms: Secondary | ICD-10-CM

## 2021-05-03 LAB — PSA: Prostate Specific Ag, Serum: 2.5 ng/mL (ref 0.0–4.0)

## 2021-05-05 ENCOUNTER — Telehealth: Payer: Self-pay | Admitting: *Deleted

## 2021-05-05 NOTE — Telephone Encounter (Signed)
-----   Message from [name], MD sent at 05/04/2021  8:57 AM EDT ----- Repeat PSA back to baseline at 2.5.  Follow-up as scheduled

## 2021-05-05 NOTE — Telephone Encounter (Signed)
Notified patient as instructed, patient pleased. Discussed follow-up appointments, patient agrees  

## 2021-05-08 ENCOUNTER — Ambulatory Visit: Payer: Self-pay | Admitting: Urology

## 2021-05-12 ENCOUNTER — Telehealth: Payer: Self-pay | Admitting: Urology

## 2021-05-12 NOTE — Telephone Encounter (Addendum)
Made appt. He states he is urinating but it is closed up more than last visit.

## 2021-05-12 NOTE — Telephone Encounter (Signed)
He had a cystoscopy in June and no strictures that needed dilation.  If he thinks he has a worsening stricture that needs dilation would need to schedule office cystoscopy

## 2021-05-12 NOTE — Telephone Encounter (Signed)
Patient called asking about having his urethra scrapped? Do you know anything about this? I told him I would ask you and have your CMA call him back.  thanks

## 2021-05-16 ENCOUNTER — Other Ambulatory Visit: Payer: Self-pay

## 2021-05-16 ENCOUNTER — Encounter: Payer: Self-pay | Admitting: Urology

## 2021-05-16 ENCOUNTER — Ambulatory Visit: Payer: Medicare PPO | Admitting: Urology

## 2021-05-16 VITALS — BP 124/73 | HR 87 | Ht 71.0 in | Wt 205.4 lb

## 2021-05-16 DIAGNOSIS — N138 Other obstructive and reflux uropathy: Secondary | ICD-10-CM

## 2021-05-16 DIAGNOSIS — N401 Enlarged prostate with lower urinary tract symptoms: Secondary | ICD-10-CM

## 2021-05-16 LAB — URINALYSIS, COMPLETE
Bilirubin, UA: NEGATIVE
Glucose, UA: NEGATIVE
Ketones, UA: NEGATIVE
Nitrite, UA: NEGATIVE
Protein,UA: NEGATIVE
Specific Gravity, UA: 1.02 (ref 1.005–1.030)
Urobilinogen, Ur: 0.2 mg/dL (ref 0.2–1.0)
pH, UA: 6 (ref 5.0–7.5)

## 2021-05-16 LAB — MICROSCOPIC EXAMINATION: WBC, UA: >30 /HPF — AB (ref 0–5)

## 2021-05-16 MED ORDER — SULFAMETHOXAZOLE-TRIMETHOPRIM 800-160 MG PO TABS: 1.0000 | ORAL_TABLET | Freq: Two times a day (BID) | ORAL | 0 refills | Status: DC

## 2021-05-16 NOTE — Progress Notes (Signed)
Scheduled for cystoscopy today for possible recurrent stricture.  Urinalysis showed >30 WBCs.  The procedure was rescheduled.  Urine culture ordered and Rx Septra DS sent to pharmacy pending culture results

## 2021-05-23 ENCOUNTER — Telehealth: Payer: Self-pay

## 2021-05-23 LAB — CULTURE, URINE COMPREHENSIVE

## 2021-05-23 MED ORDER — SULFAMETHOXAZOLE-TRIMETHOPRIM 800-160 MG PO TABS: 1.0000 | ORAL_TABLET | Freq: Two times a day (BID) | ORAL | 0 refills | Status: AC

## 2021-05-23 NOTE — Telephone Encounter (Signed)
-----   Message from [name], MD sent at 05/23/2021  2:44 PM EDT ----- Urine culture was positive and sensitive to Septra which was prescribed.  Scheduled for cystoscopy 8/18 and would make sure he has enough antibiotic to last until that appointment.

## 2021-05-23 NOTE — Telephone Encounter (Signed)
Patient notified, he states he finished abx yesterday. Script was refilled so that he can continue until cysto next week

## 2021-05-29 ENCOUNTER — Ambulatory Visit: Payer: Medicare PPO | Admitting: Urology

## 2021-05-29 ENCOUNTER — Other Ambulatory Visit: Payer: Self-pay

## 2021-05-29 ENCOUNTER — Encounter: Payer: Self-pay | Admitting: Urology

## 2021-05-29 VITALS — BP 116/74 | HR 90 | Ht 71.0 in | Wt 205.0 lb

## 2021-05-29 DIAGNOSIS — N138 Other obstructive and reflux uropathy: Secondary | ICD-10-CM

## 2021-05-29 DIAGNOSIS — N401 Enlarged prostate with lower urinary tract symptoms: Secondary | ICD-10-CM

## 2021-05-29 DIAGNOSIS — N35013 Post-traumatic anterior urethral stricture: Secondary | ICD-10-CM

## 2021-05-29 NOTE — H&P (View-Only) (Signed)
   05/29/21  CC:  Chief Complaint  Patient presents with   Cysto    HPI: Diffuse narrowing of the penile urethra.  Patient requested dilation  Blood pressure 116/74, pulse 90, height 5' 11" (1.803 m), weight 205 lb (93 kg). NED. A&Ox3.   No respiratory distress   Abd soft, NT, ND Normal phallus with bilateral descended testicles  Cystoscopy Procedure Note  Patient identification was confirmed, informed consent was obtained, and patient was prepped using Betadine solution.  Lidocaine jelly was administered per urethral meatus.     Pre-Procedure: - Inspection reveals a normal caliber urethral meatus.  Procedure: The flexible cystoscope was introduced without difficulty -Diffuse narrowing of the penile urethra.  Tight to the flexible cystoscope but able to advance into the bladder -Moderate lateral lobe enlargement prostate  -Mild elevation bladder neck - Bilateral ureteral orifices identified - Bladder mucosa  reveals no ulcers, tumors, or lesions - No bladder stones - No trabeculation  Penile urethra dilated with disposable dilators 12-18 French.  Post dilation a 16 French catheter was easily passed into the bladder   Post-Procedure: - Patient tolerated the procedure well  Assessment/ Plan: Discussed dilation of the most narrowed areas with Optilume DBC balloon and he would like to schedule.  He procedure was discussed in detail.    Dagmar Adcox C Kimmy Totten, MD  

## 2021-05-29 NOTE — Progress Notes (Addendum)
   05/29/21  CC:  Chief Complaint  Patient presents with   Cysto    HPI: Diffuse narrowing of the penile urethra.  Patient requested dilation  Blood pressure 116/74, pulse 90, height 5\' 11"  (1.803 m), weight 205 lb (93 kg). NED. A&Ox3.   No respiratory distress   Abd soft, NT, ND Normal phallus with bilateral descended testicles  Cystoscopy Procedure Note  Patient identification was confirmed, informed consent was obtained, and patient was prepped using Betadine solution.  Lidocaine jelly was administered per urethral meatus.     Pre-Procedure: - Inspection reveals a normal caliber urethral meatus.  Procedure: The flexible cystoscope was introduced without difficulty -Diffuse narrowing of the penile urethra.  Tight to the flexible cystoscope but able to advance into the bladder -Moderate lateral lobe enlargement prostate  -Mild elevation bladder neck - Bilateral ureteral orifices identified - Bladder mucosa  reveals no ulcers, tumors, or lesions - No bladder stones - No trabeculation  Penile urethra dilated with disposable dilators 12-18 .  Post dilation a 16 French catheter was easily passed into the bladder   Post-Procedure: - Patient tolerated the procedure well  Assessment/ Plan: Discussed dilation of the most narrowed areas with Optilume DBC balloon and he would like to schedule.  He procedure was discussed in detail.    [place], MD

## 2021-05-30 LAB — MICROSCOPIC EXAMINATION: Bacteria, UA: NONE SEEN

## 2021-05-30 LAB — URINALYSIS, COMPLETE
Bilirubin, UA: NEGATIVE
Glucose, UA: NEGATIVE
Ketones, UA: NEGATIVE
Nitrite, UA: NEGATIVE
Protein,UA: NEGATIVE
RBC, UA: NEGATIVE
Specific Gravity, UA: 1.02 (ref 1.005–1.030)
Urobilinogen, Ur: 0.2 mg/dL (ref 0.2–1.0)
pH, UA: 5 (ref 5.0–7.5)

## 2021-06-02 ENCOUNTER — Other Ambulatory Visit: Payer: Self-pay

## 2021-06-02 ENCOUNTER — Telehealth: Payer: Self-pay

## 2021-06-02 DIAGNOSIS — N35013 Post-traumatic anterior urethral stricture: Secondary | ICD-10-CM

## 2021-06-02 NOTE — Telephone Encounter (Addendum)
[facility] Posting Form     Inpt ( No  )   Outpt (Yes)   Obs ( No  )   Diagnosis: Urethral Stricture  -CPT: Other: 54650  Surgery:  Cysto w/Urethral Dilation (Optilume Balloon)    Surgeon: Irineo Axon, MD  Surgery Date/Time: Date: 06/17/21  Surgery Location: Day Surgery    Cardiac/Medical/Pulmonary Clearance needed: Yes  Clearance needed from Dr:  Marica Otter of CAD - Stop ASA 81mg  5-7 days prior   Clearance request sent on: Date: @TODAY @   *Orders entered into EPIC  Date: @TODAY @   *Case booked in EPIC  Date: @TODAY @  *Notified pt of Surgery: Date: @TODAY @ - Patient notified via phone, letter mailed to patient   UA& CX :06/05/21  *Placed into Prior Authorization Work Date: @TODAY @   Assistant/laser/rep:No  (Optilume Balloon)

## 2021-06-05 ENCOUNTER — Other Ambulatory Visit: Payer: Medicare PPO

## 2021-06-05 NOTE — Telephone Encounter (Signed)
Surgical Clearance received from Dr. Lady Gary stating patient is low risk for surgery, ok to stop ASA 81mg  5-7 prior to surgery. -scanned document under media  Left patient a detailed message to notify him that he is cleared to stop ASA 7 days prior to surgery.

## 2021-06-06 LAB — URINALYSIS, COMPLETE
Bilirubin, UA: NEGATIVE
Glucose, UA: NEGATIVE
Ketones, UA: NEGATIVE
Nitrite, UA: NEGATIVE
Protein,UA: NEGATIVE
RBC, UA: NEGATIVE
Specific Gravity, UA: 1.015 (ref 1.005–1.030)
Urobilinogen, Ur: 0.2 mg/dL (ref 0.2–1.0)
pH, UA: 5 (ref 5.0–7.5)

## 2021-06-06 LAB — MICROSCOPIC EXAMINATION: Bacteria, UA: NONE SEEN

## 2021-06-09 ENCOUNTER — Other Ambulatory Visit
Admission: RE | Admit: 2021-06-09 | Discharge: 2021-06-09 | Disposition: A | Discharge status: Home / Self Care | Payer: Medicare PPO | Source: Ambulatory Visit | Attending: Urology | Admitting: Urology

## 2021-06-09 HISTORY — DX: Pneumonia, unspecified organism: J18.9

## 2021-06-09 LAB — CULTURE, URINE COMPREHENSIVE

## 2021-06-09 NOTE — Patient Instructions (Addendum)
Your procedure is scheduled on: 06/17/21 - Tuesday Report to the Registration Desk on the 1st floor of the [facility]. To find out your arrival time, please call [phone] between 1PM - 3PM on: 06/13/21 - Friday  Report to [facility] for EKG on 06/10/21 at 9:30 am.  REMEMBER: Instructions that are not followed completely may result in serious medical risk, up to and including death; or upon the discretion of your surgeon and anesthesiologist your surgery may need to be rescheduled.  Do not eat food or drink any fluids after midnight the night before surgery.  No gum chewing, lozengers or hard candies.   TAKE THESE MEDICATIONS THE MORNING OF SURGERY WITH A SIP OF WATER: - carvedilol (COREG) 3.125 MG tablet  Stop Metformin 2 days prior to surgery. Do not take 09/04, 09/05, and do not take the day surgery.  Follow recommendations from Cardiologist, Pulmonologist or PCP regarding stopping Aspirin, Coumadin, Plavix, Eliquis, Pradaxa, or Pletal. Stop taking beginning 06/10/21.  One week prior to surgery: Stop Anti-inflammatories (NSAIDS) such as Advil, Aleve, Ibuprofen, Motrin, Naproxen, Naprosyn and Aspirin based products such as Excedrin, Goodys Powder, BC Powder.  Stop ANY OVER THE COUNTER supplements until after surgery. Stop taking Vit C and Zinc beginning 06/10/21.  You may however, continue to take Tylenol if needed for pain up until the day of surgery.  No Alcohol for 24 hours before or after surgery.  No Smoking including e-cigarettes for 24 hours prior to surgery.  No chewable tobacco products for at least 6 hours prior to surgery.  No nicotine patches on the day of surgery.  Do not use any "recreational" drugs for at least a week prior to your surgery.  Please be advised that the combination of cocaine and anesthesia may have negative outcomes, up to and including death. If you test positive for cocaine, your surgery will be cancelled.  On the morning of surgery  brush your teeth with toothpaste and water, you may rinse your mouth with mouthwash if you wish. Do not swallow any toothpaste or mouthwash.  Do not wear jewelry, make-up, hairpins, clips or nail polish.  Do not wear lotions, powders, or perfumes.   Do not shave body from the neck down 48 hours prior to surgery just in case you cut yourself which could leave a site for infection.  Also, freshly shaved skin may become irritated if using the CHG soap.  Contact lenses, hearing aids and dentures may not be worn into surgery.  Do not bring valuables to the hospital. [facility] is not responsible for any missing/lost belongings or valuables.   Notify your doctor if there is any change in your medical condition (cold, fever, infection).  Wear comfortable clothing (specific to your surgery type) to the hospital.  After surgery, you can help prevent lung complications by doing breathing exercises.  Take deep breaths and cough every 1-2 hours. Your doctor may order a device called an Incentive Spirometer to help you take deep breaths. When coughing or sneezing, hold a pillow firmly against your incision with both hands. This is called "splinting." Doing this helps protect your incision. It also decreases belly discomfort.  If you are being admitted to the hospital overnight, leave your suitcase in the car. After surgery it may be brought to your room.  If you are being discharged the day of surgery, you will not be allowed to drive home. You will need a responsible adult (18 years or older) to drive you home  and stay with you that night.   If you are taking public transportation, you will need to have a responsible adult (18 years or older) with you. Please confirm with your physician that it is acceptable to use public transportation.   Please call the Pre-admissions Testing Dept. at [phone] if you have any questions about these instructions.  Surgery Visitation Policy:  Patients  undergoing a surgery or procedure may have one family member or support person with them as long as that person is not COVID-19 positive or experiencing its symptoms.  That person may remain in the waiting area during the procedure.  Inpatient Visitation:    Visiting hours are 7 a.m. to 8 p.m. Inpatients will be allowed two visitors daily. The visitors may change each day during the patient's stay. No visitors under the age of 72. Any visitor under the age of 66 must be accompanied by an adult. The visitor must pass COVID-19 screenings, use hand sanitizer when entering and exiting the patient's room and wear a mask at all times, including in the patient's room. Patients must also wear a mask when staff or their visitor are in the room. Masking is required regardless of vaccination status.

## 2021-06-10 ENCOUNTER — Other Ambulatory Visit: Payer: Self-pay

## 2021-06-10 ENCOUNTER — Other Ambulatory Visit
Admission: RE | Admit: 2021-06-10 | Discharge: 2021-06-10 | Disposition: A | Discharge status: Home / Self Care | Payer: Medicare PPO | Source: Ambulatory Visit | Attending: Urology | Admitting: Urology

## 2021-06-10 ENCOUNTER — Encounter: Payer: Self-pay | Admitting: Urgent Care

## 2021-06-10 ENCOUNTER — Encounter: Payer: Self-pay | Admitting: Urology

## 2021-06-10 DIAGNOSIS — Z01810 Encounter for preprocedural cardiovascular examination: Secondary | ICD-10-CM | POA: Insufficient documentation

## 2021-06-10 NOTE — Progress Notes (Signed)
Perioperative Services  Pre-Admission/Anesthesia Testing Clinical Review  Date: 06/10/21  Patient Demographics:  Name: Casey Peters DOB:   17-Jan-1946 MRN:   [id number]  Planned Surgical Procedure(s):    Case: [id number] Date/Time: 06/17/21 1305   Procedure: CYSTOSCOPY WITH URETHRAL DILATATION - Optilume Balloon   Anesthesia type: Monitor Anesthesia Care   Pre-op diagnosis: Urethral Stricture   Location: [facility] OR ROOM 10 / [place] ORS FOR ANESTHESIA GROUP   Surgeons: Abbie Sons, MD     NOTE: Available PAT nursing documentation and vital signs have been reviewed. Clinical nursing staff has updated patient's PMH/PSHx, current medication list, and drug allergies/intolerances to ensure comprehensive history available to assist in medical decision making as it pertains to the aforementioned surgical procedure and anticipated anesthetic course. Extensive review of available clinical information performed.  PMH and PSHx updated with any diagnoses/procedures that  may have been inadvertently omitted during his intake with the pre-admission testing department's nursing staff.  Clinical Discussion:  Casey Peters is a 75 y.o. male who is submitted for pre-surgical anesthesia review and clearance prior to him undergoing the above procedure. Patient is a Current Smoker. Pertinent PMH includes: CHF, palpitations, cardiomegaly, dilated idiopathic cardiomyopathy, HTN, HLD, T2DM, diabetic polyneuropathy.  Patient is followed by cardiology Ubaldo Glassing, MD). He was last seen in the cardiology clinic on 04/09/2021; notes reviewed.  At the time of his clinic visit, patient doing well overall from a cardiovascular perspective.  He denied any episodes of chest pain, however had chronic dyspnea and fatigue.  He denied any episodes of PND, orthopnea, palpitations, significant peripheral edema, vertiginous symptoms, or presyncope/syncope.  PMH significant for a history of cardiovascular  diagnoses.  TTE performed on 02/01/2018 revealed severe left ventricular systolic dysfunction with an EF of 20%. There was mild right ventricular systolic dysfunction.  Trivial to mild pan valvular regurgitation noted.  There was no evidence of aortic or mitral valve stenosis.  Long-term cardiac event monitor study performed on 07/06/2018 revealed a predominant underlying sinus rhythm at an average heart rate of 98 bpm.  There were frequent PVCs with bigeminy and trigeminy, however no significant ventricular runs.  There were no pauses.  Most recent TTE was performed on 03/31/2021 revealing globally normal ventricular systolic function with an EF of >55%.  There was trivial MR, TR, and PR noted on exam.  Blood pressure well controlled at 118/80 on beta blocker and diuretic therapies. He is on a statin for his HLD. T2DM well controlled on currently prescribed therapy; last Hgb A1c 6.7% when checked on 06/02/2021. 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 Functional capacity, as defined by DASI, is documented as being >/= 4 METS. No changes were made to his medication regimen. Patient to follow up with outpatient cardiology in 3 months or sooner if needed.   Patient is scheduled for a cystoscopy with urethral dilatation on 06/17/2021 with Dr. John Giovanni, MD.  Given patient's past medical history significant for cardiovascular diagnoses, presurgical cardiac clearance was sought by the performing surgeon's office and PAT team. Per cardiology, "this patient is optimized for surgery and may proceed with the planned procedural course with a LOW risk of significant perioperative cardiovascular complications". This patient is on daily antiplatelet therapy. He has been instructed on recommendations for holding his daily low dose ASA for 7 days prior to his procedure with plans to restart as soon as postoperative bleeding risk felt to be minimized by his attending  surgeon. The patient  has been instructed that his last dose of his anticoagulant will be on 06/09/2021.  Patient denies previous perioperative complications with anesthesia in the past. In review of the EMR, there are no records available for review regarding previous procedural/anesthetic courses within the [facility].    Vitals with BMI 05/29/2021 05/16/2021 04/02/2021  Height _0  _1  _2   Weight 205 lbs 205 lbs 6 oz 202 lbs  BMI 28.6 65.99 35.70  Systolic 177 939 91  Diastolic 74 73 57  Pulse 90 87 97    Providers/Specialists:   NOTE: Primary physician provider listed below. Patient may have been seen by APP or partner within same practice.   PROVIDER ROLE / SPECIALTY LAST Claud Kelp, MD Urology (Surgeon) 05/29/2021  Baxter Hire, MD Primary Care Provider 06/09/2021  Bartholome Bill, MD Cardiology 04/09/2021   Allergies:  Sulfamethoxazole-trimethoprim  Current Home Medications:   No current facility-administered medications for this encounter.    aspirin EC 81 MG tablet   carvedilol (COREG) 3.125 MG tablet   cholecalciferol (VITAMIN D3) 25 MCG (1000 UNIT) tablet   furosemide (LASIX) 20 MG tablet   metFORMIN (GLUCOPHAGE) 500 MG tablet   simvastatin (ZOCOR) 10 MG tablet   spironolactone (ALDACTONE) 25 MG tablet   testosterone (ANDROGEL) 50 MG/5GM (1%) GEL   vitamin C (ASCORBIC ACID) 500 MG tablet   Zinc Gluconate 30 MG TABS   ONE TOUCH ULTRA TEST test strip   History:   Past Medical History:  Diagnosis Date   Cardiomegaly    CHF (congestive heart failure) (HCC)    Constipation    Diabetic polyneuropathy (HCC)    Dilated idiopathic cardiomyopathy (HCC)    Erectile dysfunction    HLD (hyperlipidemia)    HTN (hypertension)    Low testosterone    Palpitations    Pneumonia    T2DM (type 2 diabetes mellitus) ([place])    Past Surgical History:  Procedure Laterality Date   HERNIA REPAIR  1998   PILONIDAL CYST EXCISION     TONSILLECTOMY      URETHRA SURGERY  1995   Family History  Problem Relation Age of Onset   Hematuria Father    Kidney disease Neg Hx    Prostate cancer Neg Hx    Social History   Tobacco Use   Smoking status: Every Day    Types: Cigars   Smokeless tobacco: Never   Tobacco comments:    2-3 cigars per day  Substance Use Topics   Alcohol use: No    Alcohol/week: 0.0 standard drinks   Drug use: No    Pertinent Clinical Results:  LABS: Labs reviewed: Acceptable for surgery.   Ref Range & Units 06/02/2021  WBC (White Blood Cell Count) 4.1 - 10.2 10^3/uL 8.0   RBC (Red Blood Cell Count) 4.69 - 6.13 10^6/uL 5.33   Hemoglobin 14.1 - 18.1 gm/dL 16.2   Hematocrit 40.0 - 52.0 % 48.3   MCV (Mean Corpuscular Volume) 80.0 - 100.0 fl 90.6   MCH (Mean Corpuscular Hemoglobin) 27.0 - 31.2 pg 30.4   MCHC (Mean Corpuscular Hemoglobin Concentration) 32.0 - 36.0 gm/dL 33.5   Platelet Count 150 - 450 10^3/uL 228   RDW-CV (Red Cell Distribution Width) 11.6 - 14.8 % 13.0   MPV (Mean Platelet Volume) 9.4 - 12.4 fl 10.6   Neutrophils 1.50 - 7.80 10^3/uL 4.69   Lymphocytes 1.00 - 3.60 10^3/uL 2.08   Monocytes 0.00 - 1.50 10^3/uL 0.86   Eosinophils 0.00 -  0.55 10^3/uL 0.21   Basophils 0.00 - 0.09 10^3/uL 0.06   Neutrophil % 32.0 - 70.0 % 58.9   Lymphocyte % 10.0 - 50.0 % 26.1   Monocyte % 4.0 - 13.0 % 10.8   Eosinophil % 1.0 - 5.0 % 2.6   Basophil% 0.0 - 2.0 % 0.8   Immature Granulocyte % <=0.7 % 0.8 High    Immature Granulocyte Count <=0.06 10^3/L 0.06   Resulting Agency  [facility] - [facility]  Specimen Collected: 06/02/21 09:07 Last Resulted: 06/02/21 09:48  Received From: [facility]  Result Received: 06/02/21 15:21    Ref Range & Units 06/02/2021  Glucose 70 - 110 mg/dL 138 High    Sodium 136 - 145 mmol/L 136   Potassium 3.6 - 5.1 mmol/L 4.8   Chloride 97 - 109 mmol/L 98   Carbon Dioxide (CO2) 22.0 - 32.0 mmol/L 30.1   Urea Nitrogen (BUN) 7 - 25 mg/dL 32 High    Creatinine 0.7 -  1.3 mg/dL 1.1   Glomerular Filtration Rate (eGFR), MDRD Estimate >60 mL/min/1.73sq m 66   Calcium 8.7 - 10.3 mg/dL 9.8   AST  8 - 39 U/L 14   ALT  6 - 57 U/L 15   Alk Phos (alkaline Phosphatase) 34 - 104 U/L 51   Albumin 3.5 - 4.8 g/dL 4.4   Bilirubin, Total 0.3 - 1.2 mg/dL 0.5   Protein, Total 6.1 - 7.9 g/dL 7.3   A/G Ratio 1.0 - 5.0 gm/dL 1.5   Resulting Agency  [facility] - LAB  Specimen Collected: 06/02/21 09:07 Last Resulted: 06/02/21 11:48  Received From: [facility]  Result Received: 06/02/21 15:21    Ref Range & Units 06/02/2021  Hemoglobin A1C 4.2 - 5.6 % 6.7 High    Average Blood Glucose (Calc) mg/dL 146   Resulting Agency  [facility] - LAB    ECG: Date: 06/10/2021 Time ECG obtained: 0943 AM Rate: 86 bpm Rhythm: normal sinus Axis (leads I and aVF): Normal Intervals: PR 190 ms. QRS 84 ms. QTc 385 ms. ST segment and T wave changes: Inferior and anterior T wave abnormalities; no associated symptoms Comparison: Similar to previous tracing obtained on 01/24/2020   IMAGING / PROCEDURES: TRANSTHORACIC ECHOCARDIOGRAM performed on 03/31/2021 Normal left ventricular systolic function with an EF of >55% Normal right ventricular systolic function Trivial MR, TR, and PR No AR No mitral or aortic valve stenosis No evidence of pericardial effusion  LONG TERM CARDIAC EVENT MONITOR STUDY performed on 07/06/2018 Predominant underlying sinus rhythm at minimum heart rate of 57 bpm, maximal heart rate of 122 bpm, and average heart rate of 98 bpm. Longest R to R interval 1.5 seconds Frequent PVCs in a bigeminy and trigeminy pattern No significant ventricular runs no pauses Patient's symptoms likely secondary to PVCs  Impression and Plan:  MONTARIO ZILKA has been referred for pre-anesthesia review and clearance prior to him undergoing the planned anesthetic and procedural courses. Available labs, pertinent testing, and imaging results were  personally reviewed by me. This patient has been appropriately cleared by cardiology with an overall LOW risk of significant perioperative cardiovascular complications.  Based on clinical review performed today (06/10/21), barring any significant acute changes in the patient's overall condition, it is anticipated that he will be able to proceed with the planned surgical intervention. Any acute changes in clinical condition may necessitate his procedure being postponed and/or cancelled. Patient will meet with anesthesia team (MD and/or CRNA)  on the day of his procedure for preoperative evaluation/assessment. Questions regarding anesthetic course will be fielded at that time.   Pre-surgical instructions were reviewed with the patient during his PAT appointment and questions were fielded by PAT clinical staff. Patient was advised that if any questions or concerns arise prior to his procedure then he should return a call to PAT and/or his surgeon's office to discuss.  Honor Loh, MSN, APRN, FNP-C, CEN [facility]  Peri-operative Services Nurse Practitioner Phone: [phone] Fax: [phone] 06/10/21 11:05 AM  NOTE: This note has been prepared using Dragon dictation software. Despite my best ability to proofread, there is always the potential that unintentional transcriptional errors may still occur from this process.

## 2021-06-17 ENCOUNTER — Ambulatory Visit: Payer: Medicare PPO | Admitting: Urgent Care

## 2021-06-17 ENCOUNTER — Encounter
Admission: RE | Disposition: A | Discharge status: Home / Self Care | Payer: Self-pay | Source: Home / Self Care | Attending: Urology

## 2021-06-17 ENCOUNTER — Ambulatory Visit
Admission: RE | Admit: 2021-06-17 | Discharge: 2021-06-17 | Disposition: A | Discharge status: Home / Self Care | Payer: Medicare PPO | Attending: Urology | Admitting: Urology

## 2021-06-17 ENCOUNTER — Encounter: Payer: Self-pay | Admitting: Urology

## 2021-06-17 ENCOUNTER — Other Ambulatory Visit: Payer: Self-pay

## 2021-06-17 DIAGNOSIS — E1142 Type 2 diabetes mellitus with diabetic polyneuropathy: Secondary | ICD-10-CM | POA: Insufficient documentation

## 2021-06-17 DIAGNOSIS — Z881 Allergy status to other antibiotic agents status: Secondary | ICD-10-CM | POA: Diagnosis not present

## 2021-06-17 DIAGNOSIS — Z7984 Long term (current) use of oral hypoglycemic drugs: Secondary | ICD-10-CM | POA: Insufficient documentation

## 2021-06-17 DIAGNOSIS — I509 Heart failure, unspecified: Secondary | ICD-10-CM | POA: Insufficient documentation

## 2021-06-17 DIAGNOSIS — N35819 Other urethral stricture, male, unspecified site: Secondary | ICD-10-CM | POA: Diagnosis not present

## 2021-06-17 DIAGNOSIS — F17200 Nicotine dependence, unspecified, uncomplicated: Secondary | ICD-10-CM | POA: Insufficient documentation

## 2021-06-17 DIAGNOSIS — I110 Hypertensive heart disease with heart failure: Secondary | ICD-10-CM | POA: Insufficient documentation

## 2021-06-17 DIAGNOSIS — N35919 Unspecified urethral stricture, male, unspecified site: Secondary | ICD-10-CM | POA: Diagnosis not present

## 2021-06-17 DIAGNOSIS — N35013 Post-traumatic anterior urethral stricture: Secondary | ICD-10-CM

## 2021-06-17 HISTORY — PX: CYSTOSCOPY WITH URETHRAL DILATATION: SHX5125

## 2021-06-17 HISTORY — DX: Type 2 diabetes mellitus without complications: E11.9

## 2021-06-17 HISTORY — DX: Male erectile dysfunction, unspecified: N52.9

## 2021-06-17 HISTORY — DX: Type 2 diabetes mellitus with diabetic polyneuropathy: E11.42

## 2021-06-17 HISTORY — DX: Dilated cardiomyopathy: I42.0

## 2021-06-17 HISTORY — DX: Other specified abnormal findings of blood chemistry: R79.89

## 2021-06-17 HISTORY — DX: Palpitations: R00.2

## 2021-06-17 HISTORY — DX: Cardiomegaly: I51.7

## 2021-06-17 LAB — GLUCOSE, CAPILLARY
Glucose-Capillary: 161 mg/dL — ABNORMAL HIGH (ref 70–99)
Glucose-Capillary: 183 mg/dL — ABNORMAL HIGH (ref 70–99)

## 2021-06-17 SURGERY — CYSTOSCOPY, WITH URETHRAL DILATION
Anesthesia: General | Site: Urethra

## 2021-06-17 MED ORDER — FENTANYL CITRATE (PF) 100 MCG/2ML IJ SOLN
INTRAMUSCULAR | Status: AC
  Filled 2021-06-17: qty 2

## 2021-06-17 MED ORDER — FAMOTIDINE 20 MG PO TABS: 20.0000 mg | ORAL_TABLET | Freq: Once | ORAL | Status: AC

## 2021-06-17 MED ORDER — CHLORHEXIDINE GLUCONATE 0.12 % MT SOLN
OROMUCOSAL | Status: AC
  Administered 2021-06-17: 15 mL via OROMUCOSAL
  Filled 2021-06-17: qty 15

## 2021-06-17 MED ORDER — PROPOFOL 10 MG/ML IV BOLUS
INTRAVENOUS | Status: AC
  Filled 2021-06-17: qty 40

## 2021-06-17 MED ORDER — FAMOTIDINE 20 MG PO TABS
ORAL_TABLET | ORAL | Status: AC
  Administered 2021-06-17: 20 mg via ORAL
  Filled 2021-06-17: qty 1

## 2021-06-17 MED ORDER — MEPERIDINE HCL 25 MG/ML IJ SOLN: 6.2500 mg | INTRAMUSCULAR | Status: DC | PRN

## 2021-06-17 MED ORDER — GENTAMICIN SULFATE 40 MG/ML IJ SOLN
5.0000 mg/kg | INTRAVENOUS | Status: AC
  Administered 2021-06-17: 470 mg via INTRAVENOUS
  Filled 2021-06-17: qty 11.75

## 2021-06-17 MED ORDER — FENTANYL CITRATE (PF) 100 MCG/2ML IJ SOLN
INTRAMUSCULAR | Status: DC | PRN
  Administered 2021-06-17 (×2): 25 ug via INTRAVENOUS

## 2021-06-17 MED ORDER — SODIUM CHLORIDE 0.9 % IV SOLN: INTRAVENOUS | Status: DC

## 2021-06-17 MED ORDER — FENTANYL CITRATE (PF) 100 MCG/2ML IJ SOLN: 25.0000 ug | INTRAMUSCULAR | Status: DC | PRN

## 2021-06-17 MED ORDER — STERILE WATER FOR IRRIGATION IR SOLN
Status: DC | PRN
  Administered 2021-06-17: 1000 mL

## 2021-06-17 MED ORDER — SODIUM CHLORIDE 0.9 % IR SOLN
Status: DC | PRN
Start: 2021-06-17 — End: 2021-06-17
  Administered 2021-06-17: 3000 mL via INTRAVESICAL

## 2021-06-17 MED ORDER — ORAL CARE MOUTH RINSE: 15.0000 mL | Freq: Once | OROMUCOSAL | Status: AC

## 2021-06-17 MED ORDER — CHLORHEXIDINE GLUCONATE 0.12 % MT SOLN: 15.0000 mL | Freq: Once | OROMUCOSAL | Status: AC

## 2021-06-17 MED ORDER — CIPROFLOXACIN HCL 250 MG PO TABS: 250.0000 mg | ORAL_TABLET | Freq: Two times a day (BID) | ORAL | 0 refills | Status: AC

## 2021-06-17 MED ORDER — PROPOFOL 500 MG/50ML IV EMUL
INTRAVENOUS | Status: DC | PRN
  Administered 2021-06-17: 75 ug/kg/min via INTRAVENOUS

## 2021-06-17 MED ORDER — ONDANSETRON HCL 4 MG/2ML IJ SOLN
INTRAMUSCULAR | Status: DC | PRN
  Administered 2021-06-17: 4 mg via INTRAVENOUS

## 2021-06-17 MED ORDER — ONDANSETRON HCL 4 MG/2ML IJ SOLN: 4.0000 mg | Freq: Once | INTRAMUSCULAR | Status: DC | PRN

## 2021-06-17 SURGICAL SUPPLY — 25 items
BAG DRN RND TRDRP ANRFLXCHMBR (UROLOGICAL SUPPLIES) ×1
BAG URINE DRAIN 2000ML AR STRL (UROLOGICAL SUPPLIES) ×2 IMPLANT
BALLN OPTILUME DCB 24X5X75 (BALLOONS) ×2
BALLOON OPTILUME DCB 24X5X75 (BALLOONS) ×1 IMPLANT
CATH FOL 2WAY LX 16X5 (CATHETERS) ×2 IMPLANT
CATH FOLEY 2W COUNCIL 20FR 5CC (CATHETERS) IMPLANT
CATH FOLEY 2W COUNCIL 5CC 18FR (CATHETERS) ×2 IMPLANT
CATH SET URETHRAL DILATOR (CATHETERS) IMPLANT
ELECT REM PT RETURN 9FT ADLT (ELECTROSURGICAL)
ELECTRODE REM PT RTRN 9FT ADLT (ELECTROSURGICAL) IMPLANT
GAUZE 4X4 16PLY ~~LOC~~+RFID DBL (SPONGE) IMPLANT
GLOVE SURG UNDER POLY LF SZ7.5 (GLOVE) ×2 IMPLANT
GOWN STRL REUS W/ TWL XL LVL3 (GOWN DISPOSABLE) ×2 IMPLANT
GOWN STRL REUS W/TWL XL LVL3 (GOWN DISPOSABLE) ×4
GUIDEWIRE STR DUAL SENSOR (WIRE) ×2 IMPLANT
HOLDER FOLEY CATH W/STRAP (MISCELLANEOUS) ×2 IMPLANT
MANIFOLD NEPTUNE II (INSTRUMENTS) IMPLANT
Optilume- urethral drug coated balloon catheter ×2 IMPLANT
PACK CYSTO AR (MISCELLANEOUS) ×2 IMPLANT
SET CYSTO W/LG BORE CLAMP LF (SET/KITS/TRAYS/PACK) ×2 IMPLANT
SYR 30ML LL (SYRINGE) ×2 IMPLANT
WATER STERILE IRR 1000ML POUR (IV SOLUTION) ×2 IMPLANT
WATER STERILE IRR 3000ML UROMA (IV SOLUTION) ×2 IMPLANT
WATER STERILE IRR 500ML POUR (IV SOLUTION) ×2 IMPLANT
atrio ql 4015 disposable inflation device ×2 IMPLANT

## 2021-06-17 NOTE — Anesthesia Preprocedure Evaluation (Addendum)
Anesthesia Evaluation  Patient identified by MRN, date of birth, ID band Patient awake    Reviewed: Allergy & Precautions, NPO status , Patient's Chart, lab work & pertinent test results, reviewed documented beta blocker date and time   Airway Mallampati: II  TM Distance: >3 FB Neck ROM: Full    Dental  (+) Poor Dentition, Chipped   Pulmonary pneumonia, resolved, Current Smoker and Patient abstained from smoking.,    Pulmonary exam normal        Cardiovascular hypertension, Pt. on medications and Pt. on home beta blockers +CHF  Normal cardiovascular exam     Neuro/Psych  Neuromuscular disease negative psych ROS   GI/Hepatic negative GI ROS, Neg liver ROS,   Endo/Other  negative endocrine ROSdiabetes, Well Controlled, Oral Hypoglycemic Agents  Renal/GU negative Renal ROS  negative genitourinary   Musculoskeletal negative musculoskeletal ROS (+)   Abdominal   Peds negative pediatric ROS (+)  Hematology negative hematology ROS (+)   Anesthesia Other Findings BP (high blood pressure) 06/08/2014  CHF (congestive heart failure) (HCC)  Constipation    Diabetes (HCC)    HLD (hyperlipidemia)    HTN (hypertension)    Pneumonia    Rhinitis       Reproductive/Obstetrics negative OB ROS                             Anesthesia Physical Anesthesia Plan  ASA: 3  Anesthesia Plan: General   Post-op Pain Management:    Induction: Intravenous  PONV Risk Score and Plan: 2 and Propofol infusion  Airway Management Planned: Natural Airway and Nasal Cannula  Additional Equipment:   Intra-op Plan:   Post-operative Plan: Extubation in OR  Informed Consent: I have reviewed the patients History and Physical, chart, labs and discussed the procedure including the risks, benefits and alternatives for the proposed anesthesia with the patient or authorized representative who has indicated his/her  understanding and acceptance.       Plan Discussed with: CRNA, Anesthesiologist and Surgeon  Anesthesia Plan Comments:        Anesthesia Quick Evaluation

## 2021-06-17 NOTE — Transfer of Care (Signed)
Immediate Anesthesia Transfer of Care Note  Patient: Casey Peters  Procedure(s) Performed: CYSTOSCOPY WITH URETHRAL DILATATION (Urethra)  Patient Location: PACU  Anesthesia Type:General  Level of Consciousness: drowsy  Airway & Oxygen Therapy: Patient Spontanous Breathing and Patient connected to face mask oxygen  Post-op Assessment: Report given to RN and Post -op Vital signs reviewed and stable  Post vital signs: Reviewed and stable  Last Vitals:  Vitals Value Taken Time  BP 106/70 06/17/21 1128  Temp    Pulse 70 06/17/21 1129  Resp 16 06/17/21 1129  SpO2 100 % 06/17/21 1129  Vitals shown include unvalidated device data.  Last Pain:  Vitals:   06/17/21 1019  TempSrc: Temporal  PainSc: 0-No pain         Complications: No notable events documented.

## 2021-06-17 NOTE — Discharge Instructions (Addendum)
Cystoscopy patient instructions  Following a cystoscopy, a catheter (a flexible rubber tube) is sometimes left in place to empty the bladder. This may cause some discomfort or a feeling that you need to urinate. Your doctor determines the period of time that the catheter will be left in place. You may have bloody urine for two to three days (Call your doctor if the amount of bleeding increases or does not subside).  You may pass blood clots in your urine, especially if you had a biopsy. It is not unusual to pass small blood clots and have some bloody urine a couple of weeks after your cystoscopy. Again, call your doctor if the bleeding does not subside. You may have: Dysuria (painful urination) Frequency (urinating often) Urgency (strong desire to urinate)  These symptoms are common especially if medicine is instilled into the bladder or a ureteral stent is placed. Avoiding alcohol and caffeine, such as coffee, tea, and chocolate, may help relieve these symptoms. Drink plenty of water, unless otherwise instructed. Your doctor may also prescribe an antibiotic or other medicine to reduce these symptoms.  Cystoscopy results are available soon after the procedure; biopsy results usually take two to four days. Your doctor will discuss the results of your exam with you. Before you go home, you will be given specific instructions for follow-up care. Special Instructions:   If you are going home with a catheter in place do not take a tub bath until removed by your doctor.   You may resume your normal activities.   An antibiotic prescription was sent to your pharmacy  Be sure to keep all follow-up appointments with your doctor.   Call Your Doctor If: The catheter is not draining You have severe pain You are unable to urinate You have a fever over 101 You have severe bleeding         AMBULATORY SURGERY  DISCHARGE INSTRUCTIONS   The drugs that you were given will stay in your system until  tomorrow so for the next 24 hours you should not:  Drive an automobile Make any legal decisions Drink any alcoholic beverage   You may resume regular meals tomorrow.  Today it is better to start with liquids and gradually work up to solid foods.  You may eat anything you prefer, but it is better to start with liquids, then soup and crackers, and gradually work up to solid foods.   Please notify your doctor immediately if you have any unusual bleeding, trouble breathing, redness and pain at the surgery site, drainage, fever, or pain not relieved by medication.    Additional Instructions:        Please contact your physician with any problems or Same Day Surgery at [phone], Monday through Friday 6 am to 4 pm, or [place] at [facility] number at [phone].

## 2021-06-17 NOTE — Op Note (Signed)
Preoperative diagnosis:  Penile urethral stricture   Postoperative diagnosis:  Same  Procedure: Cystoscopy Balloon dilation urethral stricture (Optilume DBC)  Surgeon: Abbie Sons, MD  Anesthesia: MAC  Complications: None  Intraoperative findings:  Diffuse narrowing distal urethra; able to advance 17 French cystoscope.  No bulbar stricture.  Mild lateral lobe enlargement prostate, elevated bladder neck with small median lobe.  No bladder mucosal lesions identified.  UOs normal-appearing.  EBL: Minimal  Specimens: None  Indication: Casey Peters is a 75 y.o. 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  After reviewing the management options for treatment, he elected to proceed with the above surgical procedure(s). We have discussed the potential benefits and risks of the procedure, side effects of the proposed treatment, the likelihood of the patient achieving the goals of the procedure, and any potential problems that might occur during the procedure or recuperation. Informed consent has been obtained.  Description of procedure:  The patient was taken to the operating room and general anesthesia was induced.  The patient was placed in the dorsal lithotomy position, prepped and draped in the usual sterile fashion, and preoperative antibiotics were administered. A preoperative time-out was performed.   A 17 French cystoscope was lubricated, inserted per urethra and passed proximally under direct vision with findings as described above.  A 0.038 Sensor wire was advanced to the cystoscope and the cystoscope was withdrawn to the distal urethra at the strictured site.  A 24 French/50 mm Optilume DBC balloon was advanced over the guidewire and positioned across the stricture.  The balloon was then inflated to 10 atm and remained inflated for 5 minutes.  The irrigant was off once the balloon was deployed.  Balloon was deflated and removed along with the  cystoscope.  A 18 French Councill catheter was placed over the guidewire without difficulty and inflated with 10 cc of sterile water.  Catheter was placed to gravity drainage.  Plan: Follow-up 48 hours for catheter removal   Abbie Sons, M.D.

## 2021-06-17 NOTE — Anesthesia Postprocedure Evaluation (Signed)
Anesthesia Post Note  Patient: Casey Peters  Procedure(s) Performed: CYSTOSCOPY WITH URETHRAL DILATATION (Urethra)  Patient location during evaluation: PACU Anesthesia Type: General Level of consciousness: awake and alert, awake and oriented Pain management: pain level controlled Vital Signs Assessment: post-procedure vital signs reviewed and stable Respiratory status: spontaneous breathing, nonlabored ventilation and respiratory function stable Cardiovascular status: blood pressure returned to baseline and stable Postop Assessment: no apparent nausea or vomiting Anesthetic complications: no   No notable events documented.   Last Vitals:  Vitals:   06/17/21 1156 06/17/21 1206  BP:  138/79  Pulse: 61 61  Resp:  16  Temp: 36.6 C (!) 36.1 C  SpO2: 96% 99%    Last Pain:  Vitals:   06/17/21 1206  TempSrc: Temporal  PainSc: 0-No pain                 Manfred Arch

## 2021-06-17 NOTE — Progress Notes (Signed)
Patient and family member instructed how to empty foley bag, transfer from foley bag to leg bag, how to empty leg bag, Verbalizes understanding.

## 2021-06-17 NOTE — Interval H&P Note (Signed)
History and Physical Interval Note:  CV:RRR Lungs:clear  06/17/2021 10:37 AM  Casey Peters  has presented today for surgery, with the diagnosis of Urethral Stricture.  The various methods of treatment have been discussed with the patient and family. After consideration of risks, benefits and other options for treatment, the patient has consented to  Procedure(s) with comments: CYSTOSCOPY WITH URETHRAL DILATATION (N/A) - Optilume Balloon as a surgical intervention.  The patient's history has been reviewed, patient examined, no change in status, stable for surgery.  I have reviewed the patient's chart and labs.  Questions were answered to the patient's satisfaction.     [name] C Caitlin Ainley

## 2021-06-19 ENCOUNTER — Ambulatory Visit (INDEPENDENT_AMBULATORY_CARE_PROVIDER_SITE_OTHER): Payer: Medicare PPO | Admitting: Urology

## 2021-06-19 ENCOUNTER — Other Ambulatory Visit: Payer: Self-pay

## 2021-06-19 ENCOUNTER — Encounter: Payer: Self-pay | Admitting: Urology

## 2021-06-19 VITALS — BP 121/74 | HR 85 | Ht 71.0 in | Wt 207.0 lb

## 2021-06-19 DIAGNOSIS — N35013 Post-traumatic anterior urethral stricture: Secondary | ICD-10-CM

## 2021-06-19 NOTE — Progress Notes (Signed)
Status post balloon dilation urethral stricture with Optilume [facility] 06/17/2021 who presents for catheter removal.  Postop follow-up scheduled 1 month

## 2021-06-19 NOTE — Progress Notes (Signed)
Catheter Removal  Patient is present today for a catheter removal.  63ml of water was drained from the balloon. A 18FR foley cath was removed from the bladder no complications were noted . Patient tolerated well.  Performed by: Teressa Lower, CMA  Follow up/ Additional notes: 1 month

## 2021-06-22 IMAGING — CR DG FINGER THUMB 2+V*L*
1 series · 3 of 3 positions shown · non-contrast
Comparison: None.

CLINICAL DATA: Template small injury

EXAM:
LEFT THUMB 2+V

[Series 1: dg finger thumb left · 0.14mm/px · 3 of 3 slices shown]
[im 1/3]
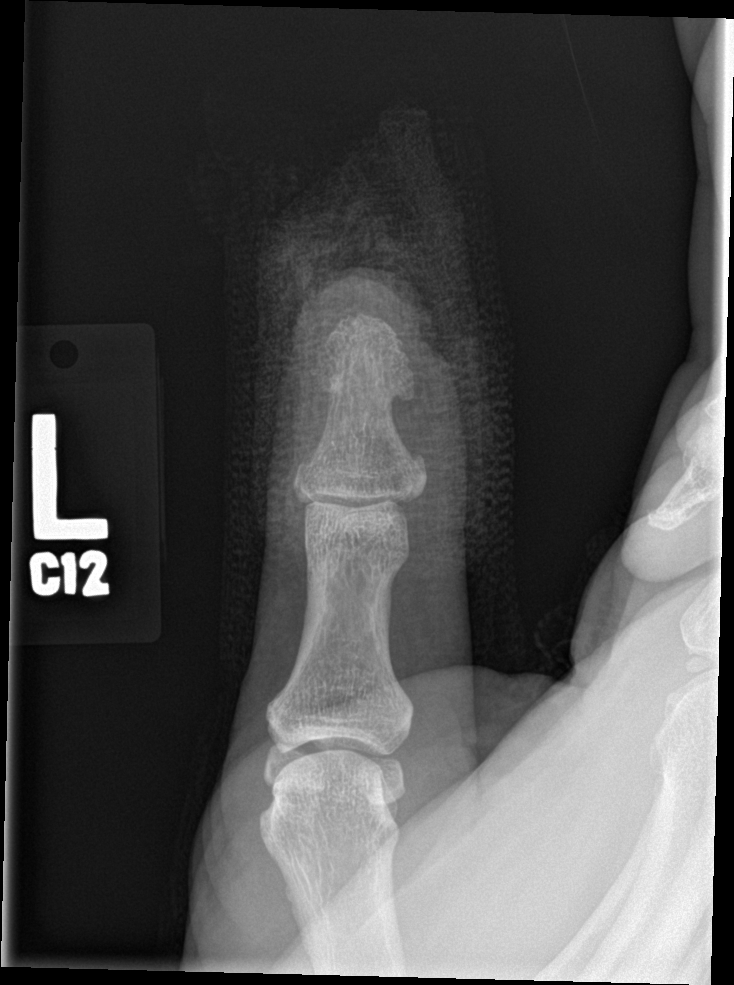
[im 2/3]
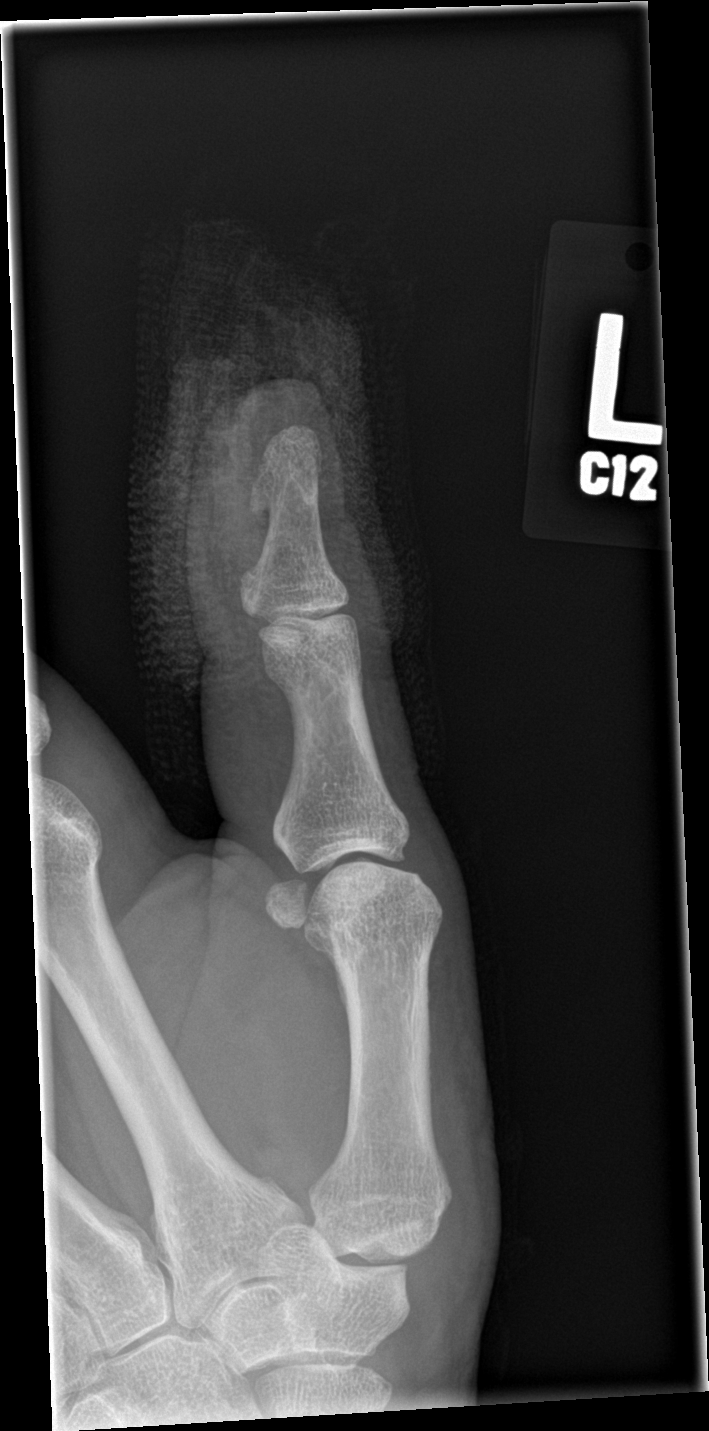
[im 3/3]
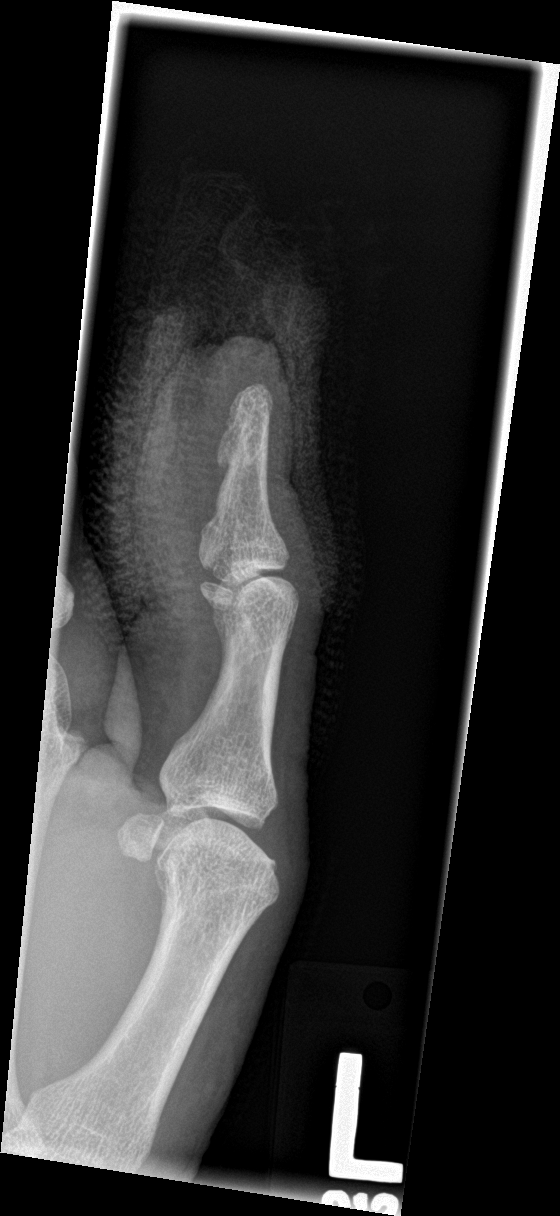

[3 of 3 positions shown; findings below may reference images not displayed]

FINDINGS: Fine details obscured by bandage over the first digit. Soft tissue
injury to the distal aspect of the first digit. No evidence of
fracture. No foreign body
IMPRESSION: No fracture or foreign body.  Soft tissue injury noted.

## 2021-07-15 ENCOUNTER — Ambulatory Visit (INDEPENDENT_AMBULATORY_CARE_PROVIDER_SITE_OTHER): Payer: Medicare PPO | Admitting: Physician Assistant

## 2021-07-15 ENCOUNTER — Encounter: Payer: Self-pay | Admitting: Physician Assistant

## 2021-07-15 ENCOUNTER — Other Ambulatory Visit: Payer: Self-pay

## 2021-07-15 VITALS — BP 121/76 | HR 89 | Ht 71.0 in | Wt 206.0 lb

## 2021-07-15 DIAGNOSIS — N35013 Post-traumatic anterior urethral stricture: Secondary | ICD-10-CM | POA: Diagnosis not present

## 2021-07-15 LAB — BLADDER SCAN AMB NON-IMAGING

## 2021-07-15 NOTE — Patient Instructions (Addendum)
Start self-cathing once daily. Let's see you back in 1 month to check your symptoms again with Dr. Lonna Cobb.

## 2021-07-18 NOTE — Progress Notes (Signed)
07/15/2021 7:16 PM   Casey Peters 10-Mar-1946 [id number]  CC: Chief Complaint  Patient presents with   Routine Post Op    HPI: Casey Peters is a 75 y.o. male with PMH BXO and recurrent urethral stricture despite remote urethral reconstruction managed with CIC who underwent cystoscopy and Optilume balloon dilation with Dr. Lonna Cobb on 06/17/2021  who presents today for postop follow-up.   Today he reports he has been self-cathing every 2-3 days with no difficulty advancing the catheters. Despite this, he reports a weak, split stream. He feels he is emptying well. He denies fever, chills, nausea, vomiting, and dysuria.  PVR 61mL.  PMH: Past Medical History:  Diagnosis Date   Cardiomegaly    CHF (congestive heart failure) (HCC)    Constipation    Diabetic polyneuropathy (HCC)    Dilated idiopathic cardiomyopathy (HCC)    Erectile dysfunction    HLD (hyperlipidemia)    HTN (hypertension)    Low testosterone    Palpitations    Pneumonia    T2DM (type 2 diabetes mellitus) (HCC)     Surgical History: Past Surgical History:  Procedure Laterality Date   CYSTOSCOPY WITH URETHRAL DILATATION N/A 06/17/2021   Procedure: CYSTOSCOPY WITH URETHRAL DILATATION;  Surgeon: [name], MD;  Location: [facility] ORS;  Service: Urology;  Laterality: N/A;  Optilume Balloon   HERNIA REPAIR  1998   PILONIDAL CYST EXCISION     TONSILLECTOMY     URETHRA SURGERY  1995    Home Medications:  Allergies as of 07/15/2021       Reactions   Sulfamethoxazole-trimethoprim    Fatigue and muscle ache        Medication List        Accurate as of July 15, 2021 11:59 PM. If you have any questions, ask your nurse or doctor.          aspirin EC 81 MG tablet Take 81 mg by mouth daily.   carvedilol 3.125 MG tablet Commonly known as: COREG Take 3.125 mg by mouth 2 (two) times daily.   cholecalciferol 25 MCG (1000 UNIT) tablet Commonly known as: VITAMIN D3 Take 1,000 Units by  mouth daily.   furosemide 20 MG tablet Commonly known as: LASIX Take 20 mg by mouth 2 (two) times daily.   metFORMIN 500 MG tablet Commonly known as: GLUCOPHAGE Take 500 mg by mouth 2 (two) times daily.   ONE TOUCH ULTRA TEST test strip Generic drug: glucose blood USE 1 STRIP 3 TIMES A DAY TO CHECK BLOOD GLUCOSE LEVELS   simvastatin 10 MG tablet Commonly known as: ZOCOR Take 10 mg by mouth at bedtime.   spironolactone 25 MG tablet Commonly known as: ALDACTONE Take 25 mg by mouth daily.   testosterone 50 MG/5GM (1%) Gel Commonly known as: ANDROGEL Apply 5 g topically daily.   vitamin C 500 MG tablet Commonly known as: ASCORBIC ACID Take 500 mg by mouth daily.   Zinc Gluconate 30 MG Tabs Take 30 mg by mouth daily.        Allergies:  Allergies  Allergen Reactions   Sulfamethoxazole-Trimethoprim     Fatigue and muscle ache    Family History: Family History  Problem Relation Age of Onset   Hematuria Father    Kidney disease Neg Hx    Prostate cancer Neg Hx     Social History:   reports that he has been smoking cigars. He has never used smokeless tobacco. He reports that he does not  drink alcohol and does not use drugs.  Physical Exam: BP 121/76   Pulse 89   Ht 5\' 11"  (1.803 m)   Wt 206 lb (93.4 kg)   BMI 28.73 kg/m   Constitutional:  Alert and oriented, no acute distress, nontoxic appearing HEENT: [place], AT Cardiovascular: No clubbing, cyanosis, or edema Respiratory: Normal respiratory effort, no increased work of breathing Skin: No rashes, bruises or suspicious lesions Neurologic: Grossly intact, no focal deficits, moving all 4 extremities Psychiatric: Normal mood and affect  Laboratory Data: Results for orders placed or performed in visit on 07/15/21  Bladder Scan (Post Void Residual) in office  Result Value Ref Range   Scan Result 58mL    Assessment & Plan:   1. Post-traumatic stricture of anterior urethra Increased ease of catheterization  following dilation, however he reports persistent weak, split stream. He is emptying appropriately today. Counseled him to increase his frequency of CIC to once daily and plan for symptom recheck with Dr. 1m in 1 month. I do not feel cystoscopy this soon after surgery would be high yield, especially given his ease of CIC. - Bladder Scan (Post Void Residual) in office  Return in about 4 weeks (around 08/12/2021) for Symptom recheck with Dr. 13/10/2020.  Lonna Cobb, PA-C  [facility] [address], [address] [place], [place] [phone]

## 2021-08-13 ENCOUNTER — Ambulatory Visit: Payer: Medicare PPO | Admitting: Urology

## 2021-08-13 ENCOUNTER — Other Ambulatory Visit: Payer: Self-pay

## 2021-08-13 ENCOUNTER — Encounter: Payer: Self-pay | Admitting: Urology

## 2021-08-13 VITALS — BP 111/69 | HR 109 | Ht 71.0 in | Wt 206.0 lb

## 2021-08-13 DIAGNOSIS — R3915 Urgency of urination: Secondary | ICD-10-CM | POA: Diagnosis not present

## 2021-08-13 DIAGNOSIS — N35013 Post-traumatic anterior urethral stricture: Secondary | ICD-10-CM

## 2021-08-13 DIAGNOSIS — R350 Frequency of micturition: Secondary | ICD-10-CM

## 2021-08-13 MED ORDER — MIRABEGRON ER 50 MG PO TB24: 50.0000 mg | ORAL_TABLET | Freq: Every day | ORAL | 0 refills | Status: DC

## 2021-08-13 NOTE — Progress Notes (Signed)
08/13/2021 2:31 PM   Casey Peters 03/19/1946 [id number]  Referring provider: Baxter Hire, MD [place],  [place] [id number]  Chief Complaint  Patient presents with   Other    HPI: 75 y.o. male presents for follow-up visit.  Refer to PA note 07/15/2021 Still complains of weak stream No problems with catheterizing Feels this problem may be more related to urinary frequency, urgency with decreased urine volume Minimal prostate enlargement on cystoscopy and he is not interested in a trial of tamsulosin   PMH: Past Medical History:  Diagnosis Date   Cardiomegaly    CHF (congestive heart failure) (HCC)    Constipation    Diabetic polyneuropathy (HCC)    Dilated idiopathic cardiomyopathy (HCC)    Erectile dysfunction    HLD (hyperlipidemia)    HTN (hypertension)    Low testosterone    Palpitations    Pneumonia    T2DM (type 2 diabetes mellitus) ([place])     Surgical History: Past Surgical History:  Procedure Laterality Date   CYSTOSCOPY WITH URETHRAL DILATATION N/A 06/17/2021   Procedure: CYSTOSCOPY WITH URETHRAL DILATATION;  Surgeon: Abbie Sons, MD;  Location: [facility] ORS;  Service: Urology;  Laterality: N/A;  Optilume Balloon   HERNIA REPAIR  1998   PILONIDAL CYST EXCISION     TONSILLECTOMY     URETHRA SURGERY  1995    Home Medications:  Allergies as of 08/13/2021       Reactions   Sulfamethoxazole-trimethoprim    Fatigue and muscle ache        Medication List        Accurate as of August 13, 2021  2:31 PM. If you have any questions, ask your nurse or doctor.          aspirin EC 81 MG tablet Take 81 mg by mouth daily.   carvedilol 3.125 MG tablet Commonly known as: COREG Take 3.125 mg by mouth 2 (two) times daily.   cholecalciferol 25 MCG (1000 UNIT) tablet Commonly known as: VITAMIN D3 Take 1,000 Units by mouth daily.   furosemide 20 MG tablet Commonly known as: LASIX Take 20 mg by mouth 2 (two) times  daily.   metFORMIN 500 MG tablet Commonly known as: GLUCOPHAGE Take 500 mg by mouth 2 (two) times daily.   mirabegron ER 50 MG Tb24 tablet Commonly known as: MYRBETRIQ Take 1 tablet (50 mg total) by mouth daily. Started by: Abbie Sons, MD   ONE TOUCH ULTRA TEST test strip Generic drug: glucose blood USE 1 STRIP 3 TIMES A DAY TO CHECK BLOOD GLUCOSE LEVELS   simvastatin 10 MG tablet Commonly known as: ZOCOR Take 10 mg by mouth at bedtime.   spironolactone 25 MG tablet Commonly known as: ALDACTONE Take 25 mg by mouth daily.   testosterone 50 MG/5GM (1%) Gel Commonly known as: ANDROGEL Apply 5 g topically daily.   vitamin C 500 MG tablet Commonly known as: ASCORBIC ACID Take 500 mg by mouth daily.   Zinc Gluconate 30 MG Tabs Take 30 mg by mouth daily.        Allergies:  Allergies  Allergen Reactions   Sulfamethoxazole-Trimethoprim     Fatigue and muscle ache    Family History: Family History  Problem Relation Age of Onset   Hematuria Father    Kidney disease Neg Hx    Prostate cancer Neg Hx     Social History:  reports that he has been smoking cigars. He has never used  smokeless tobacco. He reports that he does not drink alcohol and does not use drugs.   Physical Exam: BP 111/69   Pulse (!) 109   Ht 5\' 11"  (1.803 m)   Wt 206 lb (93.4 kg)   BMI 28.73 kg/m   Constitutional:  Alert and oriented, No acute distress. HEENT: [place] AT, moist mucus membranes.  Trachea midline, no masses. Cardiovascular: No clubbing, cyanosis, or edema. Respiratory: Normal respiratory effort, no increased work of breathing. Psychiatric: Normal mood and affect.   Assessment & Plan:    1.  Urethral stricture disease Continue intermittent catheterization  2.  Lower urinary tract symptoms He feels his symptoms would be better if he had greater urine volume He does have urinary frequency and urgency Trial Myrbetriq 50 mg daily-samples given.  Discussed potential side  effect of elevated blood pressure He will call back regarding efficacy   , MD  [facility] [address], [address] [place], [place] [phone]

## 2021-08-27 ENCOUNTER — Other Ambulatory Visit: Payer: Self-pay

## 2021-08-27 ENCOUNTER — Ambulatory Visit: Payer: Medicare PPO | Admitting: Physician Assistant

## 2021-08-27 ENCOUNTER — Encounter: Payer: Self-pay | Admitting: Physician Assistant

## 2021-08-27 VITALS — BP 112/69 | HR 84 | Ht 71.0 in | Wt 206.0 lb

## 2021-08-27 DIAGNOSIS — R3989 Other symptoms and signs involving the genitourinary system: Secondary | ICD-10-CM

## 2021-08-27 LAB — URINALYSIS, COMPLETE
Bilirubin, UA: NEGATIVE
Glucose, UA: NEGATIVE
Ketones, UA: NEGATIVE
Nitrite, UA: NEGATIVE
Protein,UA: NEGATIVE
RBC, UA: NEGATIVE
Specific Gravity, UA: 1.015 (ref 1.005–1.030)
Urobilinogen, Ur: 0.2 mg/dL (ref 0.2–1.0)
pH, UA: 5.5 (ref 5.0–7.5)

## 2021-08-27 LAB — MICROSCOPIC EXAMINATION
Bacteria, UA: NONE SEEN
Epithelial Cells (non renal): >10 /hpf — AB (ref 0–10)

## 2021-08-27 MED ORDER — MELOXICAM 7.5 MG PO TABS
7.5000 mg | ORAL_TABLET | Freq: Every day | ORAL | 0 refills | Status: AC
Start: 2021-08-27 — End: 2021-09-10

## 2021-08-28 NOTE — Progress Notes (Signed)
08/27/2021 10:48 AM   Casey Peters [birth year] [id number]  CC: Chief Complaint  Patient presents with   Urethral Stricture   HPI: Casey Peters is a 75 y.o. male with PMH paroxysmal and recurrent urethral stricture despite remote urethral reconstruction managed with CIC who underwent cystoscopy and Optilene balloon dilation with Dr. Bernardo Heater on 06/17/2021 who presents today for evaluation of urethral pain.   Today he reports diffuse urethral soreness and increased pain with self-catheterization.  He states he has been having a slightly harder time advancing the catheters due to pain.  This is been going on for about 1 week and so he has been self cathing less often because of it.  He denies hematuria or urethral discharge.  In-office UA today positive for 1+ leukocyte esterase; urine microscopy with 6-10 WBCs/HPF, 3-10 RBCs/HPF, and >10 epithelial cells/HPF.  PMH: Past Medical History:  Diagnosis Date   Cardiomegaly    CHF (congestive heart failure) (HCC)    Constipation    Diabetic polyneuropathy (HCC)    Dilated idiopathic cardiomyopathy (HCC)    Erectile dysfunction    HLD (hyperlipidemia)    HTN (hypertension)    Low testosterone    Palpitations    Pneumonia    T2DM (type 2 diabetes mellitus) ([place])     Surgical History: Past Surgical History:  Procedure Laterality Date   CYSTOSCOPY WITH URETHRAL DILATATION N/A 06/17/2021   Procedure: CYSTOSCOPY WITH URETHRAL DILATATION;  Surgeon: Abbie Sons, MD;  Location: [facility] ORS;  Service: Urology;  Laterality: N/A;  Optilume Balloon   HERNIA REPAIR  1998   PILONIDAL CYST EXCISION     TONSILLECTOMY     URETHRA SURGERY  1995    Home Medications:  Allergies as of 08/27/2021       Reactions   Sulfamethoxazole-trimethoprim    Fatigue and muscle ache        Medication List        Accurate as of August 27, 2021 11:59 PM. If you have any questions, ask your nurse or doctor.          aspirin EC 81  MG tablet Take 81 mg by mouth daily.   carvedilol 3.125 MG tablet Commonly known as: COREG Take 3.125 mg by mouth 2 (two) times daily.   cholecalciferol 25 MCG (1000 UNIT) tablet Commonly known as: VITAMIN D3 Take 1,000 Units by mouth daily.   furosemide 20 MG tablet Commonly known as: LASIX Take 20 mg by mouth 2 (two) times daily.   meloxicam 7.5 MG tablet Commonly known as: MOBIC Take 1 tablet (7.5 mg total) by mouth daily for 14 days. Started by: Debroah Loop, PA-C   metFORMIN 500 MG tablet Commonly known as: GLUCOPHAGE Take 500 mg by mouth 2 (two) times daily.   mirabegron ER 50 MG Tb24 tablet Commonly known as: MYRBETRIQ Take 1 tablet (50 mg total) by mouth daily.   ONE TOUCH ULTRA TEST test strip Generic drug: glucose blood USE 1 STRIP 3 TIMES A DAY TO CHECK BLOOD GLUCOSE LEVELS   simvastatin 10 MG tablet Commonly known as: ZOCOR Take 10 mg by mouth at bedtime.   spironolactone 25 MG tablet Commonly known as: ALDACTONE Take 25 mg by mouth daily.   testosterone 50 MG/5GM (1%) Gel Commonly known as: ANDROGEL Apply 5 g topically daily.   vitamin C 500 MG tablet Commonly known as: ASCORBIC ACID Take 500 mg by mouth daily.   Zinc Gluconate 30 MG Tabs Take 30 mg by mouth  daily.        Allergies:  Allergies  Allergen Reactions   Sulfamethoxazole-Trimethoprim     Fatigue and muscle ache    Family History: Family History  Problem Relation Age of Onset   Hematuria Father    Kidney disease Neg Hx    Prostate cancer Neg Hx     Social History:   reports that he has been smoking cigars. He has never used smokeless tobacco. He reports that he does not drink alcohol and does not use drugs.  Physical Exam: BP 112/69   Pulse 84   Ht 5\' 11"  (1.803 m)   Wt 206 lb (93.4 kg)   BMI 28.73 kg/m   Constitutional:  Alert and oriented, no acute distress, nontoxic appearing HEENT: [place], AT Cardiovascular: No clubbing, cyanosis, or edema Respiratory:  Normal respiratory effort, no increased work of breathing GU: Patent urethral meatus.  No significant urethral edema or erythema noted. Skin: No rashes, bruises or suspicious lesions Neurologic: Grossly intact, no focal deficits, moving all 4 extremities Psychiatric: Normal mood and affect  Laboratory Data: Results for orders placed or performed in visit on 08/27/21  Microscopic Examination   Urine  Result Value Ref Range   WBC, UA 6-10 (A) 0 - 5 /hpf   RBC 3-10 (A) 0 - 2 /hpf   Epithelial Cells (non renal) >10 (A) 0 - 10 /hpf   Bacteria, UA None seen None seen/Few  Urinalysis, Complete  Result Value Ref Range   Specific Gravity, UA 1.015 1.005 - 1.030   pH, UA 5.5 5.0 - 7.5   Color, UA Straw Yellow   Appearance Ur Clear Clear   Leukocytes,UA 1+ (A) Negative   Protein,UA Negative Negative/Trace   Glucose, UA Negative Negative   Ketones, UA Negative Negative   RBC, UA Negative Negative   Bilirubin, UA Negative Negative   Urobilinogen, Ur 0.2 0.2 - 1.0 mg/dL   Nitrite, UA Negative Negative   Microscopic Examination See below:    Assessment & Plan:   1. Urethral pain Likely inflammatory urethritis in the setting of self-catheterization.  UA today is contaminated, but otherwise rather reassuring for infection.  Will send for culture and GC testing to rule out underlying infection and treat as indicated.  In the meantime, I am prescribing him meloxicam and provided him with additional lubrication packets in clinic to ease catheterization.  If his symptoms persist, recommend proceeding with cystoscopy. - Urinalysis, Complete - CULTURE, URINE COMPREHENSIVE - Ct Ng M genitalium NAA, Urine - meloxicam (MOBIC) 7.5 MG tablet; Take 1 tablet (7.5 mg total) by mouth daily for 14 days.  Dispense: 14 tablet; Refill: 0  Return in about 4 weeks (around 09/24/2021) for Symptom recheck with PVR.  09/26/2021, PA-C  [facility] [address] [place], [place] [phone]

## 2021-08-30 LAB — CT NG M GENITALIUM NAA, URINE
Chlamydia trachomatis, NAA: NEGATIVE
Mycoplasma genitalium NAA: NEGATIVE
Neisseria gonorrhoeae, NAA: NEGATIVE

## 2021-09-01 LAB — CULTURE, URINE COMPREHENSIVE

## 2021-09-02 ENCOUNTER — Telehealth: Payer: Self-pay

## 2021-09-02 NOTE — Telephone Encounter (Signed)
-----   Message from Carman Ching, [place] sent at 09/02/2021  3:18 PM EST ----- Urine testing all negative for infection, inflammation is most likely. Keep up the meloxicam and extra lubrication and he should feel better in time. ----- Message ----- From: Nell Range Lab Results In Sent: 08/27/2021   4:37 PM EST To: Carman Ching, PA-C

## 2021-09-02 NOTE — Telephone Encounter (Signed)
OK per DPR, left message on home answering machine relaying msg below. Advised pt to call office should he have any questions.

## 2021-09-24 ENCOUNTER — Ambulatory Visit: Payer: Medicare PPO | Admitting: Physician Assistant

## 2021-10-02 ENCOUNTER — Encounter: Payer: Self-pay | Admitting: Emergency Medicine

## 2021-10-02 ENCOUNTER — Other Ambulatory Visit: Payer: Self-pay

## 2021-10-02 ENCOUNTER — Ambulatory Visit
Admission: EM | Admit: 2021-10-02 | Discharge: 2021-10-02 | Disposition: A | Discharge status: Home / Self Care | Payer: Medicare PPO | Attending: Physician Assistant | Admitting: Physician Assistant

## 2021-10-02 DIAGNOSIS — N3001 Acute cystitis with hematuria: Secondary | ICD-10-CM | POA: Diagnosis not present

## 2021-10-02 DIAGNOSIS — R300 Dysuria: Secondary | ICD-10-CM | POA: Insufficient documentation

## 2021-10-02 LAB — POCT URINALYSIS DIP (MANUAL ENTRY)
Bilirubin, UA: NEGATIVE
Glucose, UA: NEGATIVE mg/dL
Ketones, POC UA: NEGATIVE mg/dL
Nitrite, UA: POSITIVE — AB
Spec Grav, UA: >=1.03 — AB (ref 1.010–1.025)
Urobilinogen, UA: 1 E.U./dL
pH, UA: 5.5 (ref 5.0–8.0)

## 2021-10-02 MED ORDER — CIPROFLOXACIN HCL 500 MG PO TABS: 500.0000 mg | ORAL_TABLET | Freq: Two times a day (BID) | ORAL | 0 refills | Status: AC

## 2021-10-02 NOTE — ED Triage Notes (Signed)
Pt presents for UTI sxs and fever x 2 days

## 2021-10-02 NOTE — Discharge Instructions (Signed)
Take ciprofloxacin twice daily for 7 days.  We will contact you if we need to change your antibiotic based on your urine culture result.  Make sure you rest and drink plenty of fluid.  Follow-up with your PCP next week to ensure symptoms are improving.  If you develop any worsening symptoms including fever, back/flank pain, nausea/vomiting interfering with oral intake, weakness, difficulty urinating you need to go to the emergency room.

## 2021-10-02 NOTE — ED Provider Notes (Signed)
[facility]    CSN: [id number] Arrival date & time: 10/02/21  1112      History   Chief Complaint Chief Complaint  Patient presents with   Fever   Urinary Frequency    HPI Casey Peters is a 75 y.o. male.   Patient presents today with a 2-day history of UTI symptoms.  Reports dysuria and discomfort in his urethra as well as frequency and urgency.  He reports subjective fever but denies recorded fever.  He denies any abdominal pain, nausea, vomiting, hematuria, weakness, headache, body aches.  He does have a history of recurrent UTI and was last treated in May 2022 with Bactrim but unfortunately had side effects with this medication.  He has a history of BPH as well as urethral stricture so followed by urology.  He was last seen approximately 1 month ago.  He does report having urogenital procedure to improve urethral stricture approximately 3 months ago but denies any recent procedures.  He denies any recent antibiotic use.  Reports he typically does well with ciprofloxacin and is requesting this if appropriate today.  He does use self catheterization and reports using a new catheter with each episode of micturition.   Past Medical History:  Diagnosis Date   Cardiomegaly    CHF (congestive heart failure) (HCC)    Constipation    Diabetic polyneuropathy ([place])    Dilated idiopathic cardiomyopathy (HCC)    Erectile dysfunction    HLD (hyperlipidemia)    HTN (hypertension)    Low testosterone    Palpitations    Pneumonia    T2DM (type 2 diabetes mellitus) ([place])     Patient Active Problem List   Diagnosis Date Noted   Palpitations 02/08/2020   Low testosterone 10/26/2019   Pain of finger [id number]   Chronic systolic heart failure ([place]) 10/25/2018   Tobacco use 10/25/2018   Rising PSA level 07/10/2015   Microscopic hematuria 07/10/2015   Type 2 diabetes mellitus ([place]) 06/08/2014   HLD (hyperlipidemia) 06/08/2014   BP (high blood pressure) 06/08/2014    Obesity, diabetes, and hypertension syndrome ([place]) 06/08/2014   Adiposity 06/08/2014   Controlled type 2 diabetes mellitus without complication ([place]) [id number]    Past Surgical History:  Procedure Laterality Date   CYSTOSCOPY WITH URETHRAL DILATATION N/A 06/17/2021   Procedure: CYSTOSCOPY WITH URETHRAL DILATATION;  Surgeon: Abbie Sons, MD;  Location: [facility] ORS;  Service: Urology;  Laterality: N/A;  Optilume Balloon   HERNIA REPAIR  1998   PILONIDAL CYST EXCISION     TONSILLECTOMY     URETHRA SURGERY  1995       Home Medications    Prior to Admission medications   Medication Sig Start Date End Date Taking? Authorizing Provider  ciprofloxacin (CIPRO) 500 MG tablet Take 1 tablet (500 mg total) by mouth every 12 (twelve) hours for 7 days. 10/02/21 10/09/21 Yes Galen Malkowski, Derry Skill, PA-C  meloxicam (MOBIC) 7.5 MG tablet  08/27/21  Yes [provider]  aspirin EC 81 MG tablet Take 81 mg by mouth daily.    [provider]  carvedilol (COREG) 3.125 MG tablet Take 3.125 mg by mouth 2 (two) times daily. 05/19/18   [provider]  cholecalciferol (VITAMIN D3) 25 MCG (1000 UNIT) tablet Take 1,000 Units by mouth daily.    [provider]  furosemide (LASIX) 20 MG tablet Take 20 mg by mouth 2 (two) times daily. 06/07/18   [provider]  metFORMIN (GLUCOPHAGE) 500 MG tablet  Take 500 mg by mouth 2 (two) times daily. 04/16/21   [provider]  mirabegron ER (MYRBETRIQ) 50 MG TB24 tablet Take 1 tablet (50 mg total) by mouth daily. 08/13/21   Stoioff, Ronda Fairly, MD  ONE TOUCH ULTRA TEST test strip USE 1 STRIP 3 TIMES A DAY TO CHECK BLOOD GLUCOSE LEVELS 09/24/18   [provider]  simvastatin (ZOCOR) 10 MG tablet Take 10 mg by mouth at bedtime. 03/19/21   [provider]  spironolactone (ALDACTONE) 25 MG tablet Take 25 mg by mouth daily. 02/21/18 06/05/21  [provider]  testosterone (ANDROGEL) 50 MG/5GM (1%) GEL Apply 5 g  topically daily. 04/26/18   [provider]  vitamin C (ASCORBIC ACID) 500 MG tablet Take 500 mg by mouth daily.    [provider]  Zinc Gluconate 30 MG TABS Take 30 mg by mouth daily.    [provider]    Family History Family History  Problem Relation Age of Onset   Hematuria Father    Kidney disease Neg Hx    Prostate cancer Neg Hx     Social History Social History   Tobacco Use   Smoking status: Every Day    Types: Cigars   Smokeless tobacco: Never   Tobacco comments:    2-3 cigars per day  Vaping Use   Vaping Use: Never used  Substance Use Topics   Alcohol use: No    Alcohol/week: 0.0 standard drinks   Drug use: No     Allergies   Sulfamethoxazole-trimethoprim   Review of Systems Review of Systems  Constitutional:  Positive for activity change, fatigue and fever. Negative for appetite change.  Respiratory:  Negative for cough and shortness of breath.   Cardiovascular:  Negative for chest pain.  Gastrointestinal:  Negative for abdominal pain, diarrhea, nausea and vomiting.  Genitourinary:  Positive for dysuria, frequency and urgency. Negative for flank pain, hematuria, penile pain and penile swelling.  Musculoskeletal:  Negative for arthralgias and myalgias.  Neurological:  Negative for dizziness, light-headedness and headaches.    Physical Exam Triage Vital Signs ED Triage Vitals  Enc Vitals Group     BP 10/02/21 1139 124/81     Pulse Rate 10/02/21 1139 94     Resp 10/02/21 1139 18     Temp 10/02/21 1139 98 F (36.7 C)     Temp Source 10/02/21 1139 Oral     SpO2 10/02/21 1139 93 %     Weight --      Height --      Head Circumference --      Peak Flow --      Pain Score 10/02/21 1141 0     Pain Loc --      Pain Edu? --      Excl. in [place]? --    No data found.  Updated Vital Signs BP 124/81 (BP Location: Left Arm)    Pulse 94    Temp 98 F (36.7 C) (Oral)    Resp 18    SpO2 93%   Visual Acuity Right Eye Distance:    Left Eye Distance:   Bilateral Distance:    Right Eye Near:   Left Eye Near:    Bilateral Near:     Physical Exam Vitals reviewed.  Constitutional:      General: He is awake.     Appearance: Normal appearance. He is well-developed. He is not ill-appearing.     Comments: Very pleasant male appears  stated age in no acute distress sitting comfortably in exam room  HENT:     Head: Normocephalic and atraumatic.     Mouth/Throat:     Pharynx: Uvula midline. No oropharyngeal exudate, posterior oropharyngeal erythema or uvula swelling.  Cardiovascular:     Rate and Rhythm: Normal rate and regular rhythm.     Heart sounds: Normal heart sounds, S1 normal and S2 normal. No murmur heard. Pulmonary:     Effort: Pulmonary effort is normal.     Breath sounds: Normal breath sounds. No stridor. No wheezing, rhonchi or rales.     Comments: Clear to auscultation bilaterally Abdominal:     General: Bowel sounds are normal.     Palpations: Abdomen is soft.     Tenderness: There is no abdominal tenderness. There is no right CVA tenderness, left CVA tenderness, guarding or rebound.     Comments: Benign abdominal exam.  No tenderness to palpation.  No CVA tenderness.  Neurological:     Mental Status: He is alert.  Psychiatric:        Behavior: Behavior is cooperative.     UC Treatments / Results  Labs (all labs ordered are listed, but only abnormal results are displayed) Labs Reviewed  POCT URINALYSIS DIP (MANUAL ENTRY) - Abnormal; Notable for the following components:      Result Value   Clarity, UA cloudy (*)    Spec Grav, UA >=1.030 (*)    Blood, UA small (*)    Protein Ur, POC trace (*)    Nitrite, UA Positive (*)    Leukocytes, UA Small (1+) (*)    All other components within normal limits  URINE CULTURE    EKG   Radiology No results found.  Procedures Procedures (including critical care time)  Medications Ordered in UC Medications - No data to display  Initial  Impression / Assessment and Plan / UC Course  I have reviewed the triage vital signs and the nursing notes.  Pertinent labs & imaging results that were available during my care of the patient were reviewed by me and considered in my medical decision making (see chart for details).     Vital signs and physical exam reassuring today; no indication for emergent evaluation imaging.  UA consistent with UTI.  Low suspicion for pyelonephritis given lack of CVA tenderness and patient is afebrile in clinic.  Patient is unwell with ciprofloxacin in the past as well prescribed 500 mg twice daily for 7 days given use of catheters.  No indication for dose adjustment based on with creatinine of 1.1 and calculated creatinine clearance of 79.05 mL/min.  Urine culture was obtained and we discussed potential need to change antibiotics based on susceptibilities identified on culture.  He was encouraged to rest and drink plenty of fluid.  Recommended follow-up with urology next week for reevaluation.  Discussed alarm symptoms that warrant emergent evaluation including blood in urine, back/flank pain, high fever, nausea/vomiting interfering with oral intake.  Strict return precautions given to which he expressed understanding.  Final Clinical Impressions(s) / UC Diagnoses   Final diagnoses:  Acute cystitis with hematuria  Dysuria     Discharge Instructions      Take ciprofloxacin twice daily for 7 days.  We will contact you if we need to change your antibiotic based on your urine culture result.  Make sure you rest and drink plenty of fluid.  Follow-up with your PCP next week to ensure symptoms are improving.  If you develop any  worsening symptoms including fever, back/flank pain, nausea/vomiting interfering with oral intake, weakness, difficulty urinating you need to go to the emergency room.     ED Prescriptions     Medication Sig Dispense Auth. Provider   ciprofloxacin (CIPRO) 500 MG tablet Take 1 tablet  (500 mg total) by mouth every 12 (twelve) hours for 7 days. 14 tablet Hisae Decoursey, Derry Skill, PA-C      PDMP not reviewed this encounter.   Terrilee Croak, PA-C 10/02/21 1211

## 2021-10-04 LAB — URINE CULTURE: Culture: >=100000 — AB

## 2021-10-10 ENCOUNTER — Ambulatory Visit: Payer: Self-pay | Admitting: Urology

## 2021-12-01 ENCOUNTER — Telehealth: Payer: Self-pay | Admitting: Urology

## 2021-12-01 NOTE — Telephone Encounter (Signed)
Casey Peters, Casey Czech, MD  [name] I had talked to him several visits ago and he declined so I quit asking.  Would do [facility] referral for recurrent penile urethral stricture-Dr. Aundria Rud.  Thanks        Previous Messages   ----- Message -----  From: [name]  Sent: 11/20/2021   1:35 PM EST  To: [name], MD  Subject: referral                                       Patient called asking about a referral for  uretha reconstruction. There is nothing in the notes about one.  Please advise.   thanks    I have tried several times to reach the patient to speak with him about a referral to [facility]. I have left several messages for him to call the office to let us know what he would like to do.  Thanks, Marcelino Duster

## 2021-12-18 ENCOUNTER — Ambulatory Visit
Admission: EM | Admit: 2021-12-18 | Discharge: 2021-12-18 | Disposition: A | Discharge status: Home / Self Care | Payer: Medicare PPO | Attending: Family Medicine | Admitting: Family Medicine

## 2021-12-18 ENCOUNTER — Encounter: Payer: Self-pay | Admitting: Emergency Medicine

## 2021-12-18 ENCOUNTER — Other Ambulatory Visit: Payer: Self-pay

## 2021-12-18 DIAGNOSIS — N3001 Acute cystitis with hematuria: Secondary | ICD-10-CM | POA: Diagnosis present

## 2021-12-18 DIAGNOSIS — R300 Dysuria: Secondary | ICD-10-CM | POA: Diagnosis present

## 2021-12-18 LAB — POCT URINALYSIS DIP (MANUAL ENTRY)
Bilirubin, UA: NEGATIVE
Glucose, UA: NEGATIVE mg/dL
Ketones, POC UA: NEGATIVE mg/dL
Nitrite, UA: NEGATIVE
Protein Ur, POC: NEGATIVE mg/dL
Spec Grav, UA: 1.02 (ref 1.010–1.025)
Urobilinogen, UA: 0.2 E.U./dL
pH, UA: 5.5 (ref 5.0–8.0)

## 2021-12-18 MED ORDER — NITROFURANTOIN MONOHYD MACRO 100 MG PO CAPS: 100.0000 mg | ORAL_CAPSULE | Freq: Two times a day (BID) | ORAL | 0 refills | Status: DC

## 2021-12-18 NOTE — ED Triage Notes (Signed)
PT c/o lower back pain and producing a small amount of urine x 3 days  ?

## 2021-12-18 NOTE — ED Provider Notes (Signed)
?[facility] ? ? ? ?CSN: [id number] ?Arrival date & time: 12/18/21  7846 ? ? ?  ? ?History   ?Chief Complaint ?Chief Complaint  ?Patient presents with  ? Back Pain  ? Urinary Concerns  ? ? ?HPI ?Casey Peters is a 76 y.o. male.  ? ?HPI ?Patient with a medical history significant for CHF, type 2 diabetes, BPH, recurrent urinary tract infections presents today with dysuria and flank pain bilaterally.  Symptoms have been present for 3 to 4 days.  Patient's last urinary tract infection was in January and he was also seen here and December and treated with Cipro however his urine culture produced a bacteria which was more sensitive to Macrobid.  He is afebrile and denies any nausea, vomiting. ? ?Past Medical History:  ?Diagnosis Date  ? Cardiomegaly   ? CHF (congestive heart failure) (HCC)   ? Constipation   ? Diabetic polyneuropathy (HCC)   ? Dilated idiopathic cardiomyopathy (HCC)   ? Erectile dysfunction   ? HLD (hyperlipidemia)   ? HTN (hypertension)   ? Low testosterone   ? Palpitations   ? Pneumonia   ? T2DM (type 2 diabetes mellitus) (HCC)   ? ? ?Patient Active Problem List  ? Diagnosis Date Noted  ? Palpitations 02/08/2020  ? Low testosterone 10/26/2019  ? Pain of finger 06/29/2019  ? Chronic systolic heart failure (HCC) 10/25/2018  ? Tobacco use 10/25/2018  ? Rising PSA level 07/10/2015  ? Microscopic hematuria 07/10/2015  ? Type 2 diabetes mellitus (HCC) 06/08/2014  ? HLD (hyperlipidemia) 06/08/2014  ? BP (high blood pressure) 06/08/2014  ? Obesity, diabetes, and hypertension syndrome (HCC) 06/08/2014  ? Adiposity 06/08/2014  ? Controlled type 2 diabetes mellitus without complication (HCC) 06/08/2014  ? ? ?Past Surgical History:  ?Procedure Laterality Date  ? CYSTOSCOPY WITH URETHRAL DILATATION N/A 06/17/2021  ? Procedure: CYSTOSCOPY WITH URETHRAL DILATATION;  Surgeon: [name], MD;  Location: [facility] ORS;  Service: Urology;  Laterality: N/A;  Optilume Balloon  ? HERNIA REPAIR  1998  ?  PILONIDAL CYST EXCISION    ? TONSILLECTOMY    ? URETHRA SURGERY  1995  ? ? ? ? ? ?Home Medications   ? ?Prior to Admission medications   ?Medication Sig Start Date End Date Taking? Authorizing Provider  ?metFORMIN (GLUCOPHAGE) 500 MG tablet Take 1 tablet by mouth 2 (two) times daily with a meal. 10/14/21  Yes [provider]  ?nitrofurantoin, macrocrystal-monohydrate, (MACROBID) 100 MG capsule Take 1 capsule (100 mg total) by mouth 2 (two) times daily. 12/18/21  Yes Bing Neighbors, FNP  ?aspirin EC 81 MG tablet Take 81 mg by mouth daily.    [provider]  ?carvedilol (COREG) 3.125 MG tablet Take 3.125 mg by mouth 2 (two) times daily. 05/19/18   [provider]  ?cholecalciferol (VITAMIN D3) 25 MCG (1000 UNIT) tablet Take 1,000 Units by mouth daily.    [provider]  ?furosemide (LASIX) 20 MG tablet Take 20 mg by mouth 2 (two) times daily. 06/07/18   [provider]  ?meloxicam (MOBIC) 7.5 MG tablet  08/27/21   [provider]  ?mirabegron ER (MYRBETRIQ) 50 MG TB24 tablet Take 1 tablet (50 mg total) by mouth daily. 08/13/21   Stoioff, Verna Czech, MD  ?ONE TOUCH ULTRA TEST test strip USE 1 STRIP 3 TIMES A DAY TO CHECK BLOOD GLUCOSE LEVELS 09/24/18   [provider]  ?simvastatin (ZOCOR) 10 MG tablet Take 10 mg by mouth at bedtime.  03/19/21   [provider]  ?spironolactone (ALDACTONE) 25 MG tablet Take 25 mg by mouth daily. 02/21/18 06/05/21  [provider]  ?testosterone (ANDROGEL) 50 MG/5GM (1%) GEL Apply 5 g topically daily. 04/26/18   [provider]  ?vitamin C (ASCORBIC ACID) 500 MG tablet Take 500 mg by mouth daily.    [provider]  ?Zinc Gluconate 30 MG TABS Take 30 mg by mouth daily.    [provider]  ? ? ?Family History ?Family History  ?Problem Relation Age of Onset  ? Hematuria Father   ? Kidney disease Neg Hx   ? Prostate cancer Neg Hx   ? ? ?Social History ?Social History  ? ?Tobacco Use  ? Smoking  status: Every Day  ?  Types: Cigars  ? Smokeless tobacco: Never  ? Tobacco comments:  ?  2-3 cigars per day  ?Vaping Use  ? Vaping Use: Never used  ?Substance Use Topics  ? Alcohol use: No  ?  Alcohol/week: 0.0 standard drinks  ? Drug use: No  ? ? ? ?Allergies   ?Sulfamethoxazole-trimethoprim ? ? ?Review of Systems ?Review of Systems ?Pertinent negatives listed in HPI  ? ?Physical Exam ?Triage Vital Signs ?ED Triage Vitals [12/18/21 0940]  ?Enc Vitals Group  ?   BP 120/74  ?   Pulse Rate 90  ?   Resp 16  ?   Temp 99 ?F (37.2 ?C)  ?   Temp src   ?   SpO2 95 %  ?   Weight   ?   Height   ?   Head Circumference   ?   Peak Flow   ?   Pain Score 3  ?   Pain Loc   ?   Pain Edu?   ?   Excl. in [place]?   ? ?No data found. ? ?Updated Vital Signs ?BP 120/74 (BP Location: Left Arm)   Pulse 90   Temp 99 ?F (37.2 ?C)   Resp 16   SpO2 95%  ? ?Visual Acuity ?Right Eye Distance:   ?Left Eye Distance:   ?Bilateral Distance:   ? ?Right Eye Near:   ?Left Eye Near:    ?Bilateral Near:    ? ?Physical Exam ?General appearance: Alert, chronically ill appearing, cooperative, no distress ?Head: Normocephalic, without obvious abnormality, atraumatic ?Respiratory: Respirations even and unlabored, normal respiratory rate ?Heart: rate and rhythm normal.   ?CVA:  bilateral flank tenderness ?Extremities: No gross deformities ?Skin: Skin color, texture, turgor normal. No rashes seen  ?Psych: Appropriate mood and affect.  ?UC Treatments / Results  ?Labs ?(all labs ordered are listed, but only abnormal results are displayed) ?Labs Reviewed  ?POCT URINALYSIS DIP (MANUAL ENTRY) - Abnormal; Notable for the following components:  ?    Result Value  ? Clarity, UA cloudy (*)   ? Blood, UA trace-lysed (*)   ? Leukocytes, UA Small (1+) (*)   ? All other components within normal limits  ?URINE CULTURE  ? ? ?EKG ? ? ?Radiology ?No results found. ? ?Procedures ?Procedures (including critical care time) ? ?Medications Ordered in UC ?Medications - No data to  display ? ?Initial Impression / Assessment and Plan / UC Course  ?I have reviewed the triage vital signs and the nursing notes. ? ?Pertinent labs & imaging results that were available during my care of the patient were reviewed by me and considered in my medical decision making (see chart for details). ? ?  ?  ?  Acute cystitis, recurrent, complicated. ?UA abnormal and findings consistent with UTI.  ?Empiric antibiotic treatment initiated based on prior urine culture. Encouraged increase intake of water. Urine culture pending.  ?ER if symptoms become severe.  ?Follow-up with PCP if symptoms do not completely resolve.  ?Final Clinical Impressions(s) / UC Diagnoses  ? ?Final diagnoses:  ?Dysuria  ?Acute cystitis with hematuria  ? ?Discharge Instructions   ?None ?  ? ?ED Prescriptions   ? ? Medication Sig Dispense Auth. Provider  ? nitrofurantoin, macrocrystal-monohydrate, (MACROBID) 100 MG capsule Take 1 capsule (100 mg total) by mouth 2 (two) times daily. 20 capsule Bing Neighbors, FNP  ? ?  ? ?PDMP not reviewed this encounter. ?  ?Bing Neighbors, FNP ?12/18/21 1011 ? ?

## 2021-12-19 LAB — URINE CULTURE
Culture: <10000 — AB
Special Requests: NORMAL

## 2021-12-27 ENCOUNTER — Inpatient Hospital Stay
Admission: EM | Admit: 2021-12-27 | Discharge: 2021-12-29 | DRG: 247 | Disposition: A | Discharge status: Home / Self Care | Payer: Medicare PPO | Attending: Internal Medicine | Admitting: Internal Medicine

## 2021-12-27 ENCOUNTER — Encounter
Admission: EM | Disposition: A | Discharge status: Home / Self Care | Payer: Self-pay | Source: Home / Self Care | Attending: Student

## 2021-12-27 ENCOUNTER — Emergency Department: Payer: Medicare PPO

## 2021-12-27 DIAGNOSIS — Z79899 Other long term (current) drug therapy: Secondary | ICD-10-CM | POA: Diagnosis not present

## 2021-12-27 DIAGNOSIS — F17290 Nicotine dependence, other tobacco product, uncomplicated: Secondary | ICD-10-CM | POA: Diagnosis present

## 2021-12-27 DIAGNOSIS — Z881 Allergy status to other antibiotic agents status: Secondary | ICD-10-CM | POA: Diagnosis not present

## 2021-12-27 DIAGNOSIS — I110 Hypertensive heart disease with heart failure: Secondary | ICD-10-CM | POA: Diagnosis present

## 2021-12-27 DIAGNOSIS — Z6830 Body mass index (BMI) 30.0-30.9, adult: Secondary | ICD-10-CM | POA: Diagnosis not present

## 2021-12-27 DIAGNOSIS — E1165 Type 2 diabetes mellitus with hyperglycemia: Secondary | ICD-10-CM | POA: Diagnosis present

## 2021-12-27 DIAGNOSIS — E1142 Type 2 diabetes mellitus with diabetic polyneuropathy: Secondary | ICD-10-CM | POA: Diagnosis present

## 2021-12-27 DIAGNOSIS — I2111 ST elevation (STEMI) myocardial infarction involving right coronary artery: Principal | ICD-10-CM | POA: Diagnosis present

## 2021-12-27 DIAGNOSIS — Z7982 Long term (current) use of aspirin: Secondary | ICD-10-CM

## 2021-12-27 DIAGNOSIS — I5022 Chronic systolic (congestive) heart failure: Secondary | ICD-10-CM | POA: Diagnosis present

## 2021-12-27 DIAGNOSIS — Z8249 Family history of ischemic heart disease and other diseases of the circulatory system: Secondary | ICD-10-CM

## 2021-12-27 DIAGNOSIS — E1169 Type 2 diabetes mellitus with other specified complication: Secondary | ICD-10-CM

## 2021-12-27 DIAGNOSIS — E669 Obesity, unspecified: Secondary | ICD-10-CM | POA: Diagnosis present

## 2021-12-27 DIAGNOSIS — I213 ST elevation (STEMI) myocardial infarction of unspecified site: Secondary | ICD-10-CM | POA: Insufficient documentation

## 2021-12-27 DIAGNOSIS — E785 Hyperlipidemia, unspecified: Secondary | ICD-10-CM | POA: Diagnosis present

## 2021-12-27 DIAGNOSIS — I420 Dilated cardiomyopathy: Secondary | ICD-10-CM

## 2021-12-27 DIAGNOSIS — Z8701 Personal history of pneumonia (recurrent): Secondary | ICD-10-CM

## 2021-12-27 DIAGNOSIS — I2510 Atherosclerotic heart disease of native coronary artery without angina pectoris: Secondary | ICD-10-CM | POA: Diagnosis present

## 2021-12-27 DIAGNOSIS — Z9889 Other specified postprocedural states: Secondary | ICD-10-CM

## 2021-12-27 DIAGNOSIS — R079 Chest pain, unspecified: Secondary | ICD-10-CM | POA: Diagnosis not present

## 2021-12-27 DIAGNOSIS — Z791 Long term (current) use of non-steroidal anti-inflammatories (NSAID): Secondary | ICD-10-CM | POA: Diagnosis not present

## 2021-12-27 DIAGNOSIS — I5020 Unspecified systolic (congestive) heart failure: Secondary | ICD-10-CM

## 2021-12-27 DIAGNOSIS — M25519 Pain in unspecified shoulder: Secondary | ICD-10-CM

## 2021-12-27 DIAGNOSIS — E119 Type 2 diabetes mellitus without complications: Secondary | ICD-10-CM

## 2021-12-27 DIAGNOSIS — E781 Pure hyperglyceridemia: Secondary | ICD-10-CM

## 2021-12-27 DIAGNOSIS — Z20822 Contact with and (suspected) exposure to covid-19: Secondary | ICD-10-CM | POA: Diagnosis present

## 2021-12-27 HISTORY — PX: CORONARY/GRAFT ACUTE MI REVASCULARIZATION: CATH118305

## 2021-12-27 HISTORY — PX: LEFT HEART CATH AND CORONARY ANGIOGRAPHY: CATH118249

## 2021-12-27 LAB — CBC WITH DIFFERENTIAL/PLATELET
Abs Immature Granulocytes: 0.05 10*3/uL (ref 0.00–0.07)
Basophils Absolute: 0.1 10*3/uL (ref 0.0–0.1)
Basophils Relative: 1 %
Eosinophils Absolute: 0.2 10*3/uL (ref 0.0–0.5)
Eosinophils Relative: 2 %
HCT: 47.4 % (ref 39.0–52.0)
Hemoglobin: 15.6 g/dL (ref 13.0–17.0)
Immature Granulocytes: 1 %
Lymphocytes Relative: 28 %
Lymphs Abs: 2.7 10*3/uL (ref 0.7–4.0)
MCH: 29.5 pg (ref 26.0–34.0)
MCHC: 32.9 g/dL (ref 30.0–36.0)
MCV: 89.6 fL (ref 80.0–100.0)
Monocytes Absolute: 0.9 10*3/uL (ref 0.1–1.0)
Monocytes Relative: 10 %
Neutro Abs: 5.5 10*3/uL (ref 1.7–7.7)
Neutrophils Relative %: 58 %
Platelets: 214 10*3/uL (ref 150–400)
RBC: 5.29 MIL/uL (ref 4.22–5.81)
RDW: 13.2 % (ref 11.5–15.5)
WBC: 9.4 10*3/uL (ref 4.0–10.5)
nRBC: 0 % (ref 0.0–0.2)

## 2021-12-27 LAB — COMPREHENSIVE METABOLIC PANEL
ALT: 28 U/L (ref 0–44)
AST: 30 U/L (ref 15–41)
Albumin: 4 g/dL (ref 3.5–5.0)
Alkaline Phosphatase: 59 U/L (ref 38–126)
Anion gap: 16 — ABNORMAL HIGH (ref 5–15)
BUN: 30 mg/dL — ABNORMAL HIGH (ref 8–23)
CO2: 23 mmol/L (ref 22–32)
Calcium: 9.5 mg/dL (ref 8.9–10.3)
Chloride: 100 mmol/L (ref 98–111)
Creatinine, Ser: 1.06 mg/dL (ref 0.61–1.24)
GFR, Estimated: >60 mL/min (ref >60)
Glucose, Bld: 141 mg/dL — ABNORMAL HIGH (ref 70–99)
Potassium: 4.7 mmol/L (ref 3.5–5.1)
Sodium: 139 mmol/L (ref 135–145)
Total Bilirubin: 1.4 mg/dL — ABNORMAL HIGH (ref 0.3–1.2)
Total Protein: 7.8 g/dL (ref 6.5–8.1)

## 2021-12-27 LAB — LIPID PANEL
Cholesterol: 322 mg/dL — ABNORMAL HIGH (ref 0–200)
LDL Cholesterol: UNDETERMINED mg/dL (ref 0–99)
Triglycerides: 1595 mg/dL — ABNORMAL HIGH (ref <150)
VLDL: UNDETERMINED mg/dL (ref 0–40)

## 2021-12-27 LAB — RESP PANEL BY RT-PCR (FLU A&B, COVID) ARPGX2
Influenza A by PCR: NEGATIVE
Influenza B by PCR: NEGATIVE
SARS Coronavirus 2 by RT PCR: NEGATIVE

## 2021-12-27 LAB — GLUCOSE, CAPILLARY
Glucose-Capillary: 184 mg/dL — ABNORMAL HIGH (ref 70–99)
Glucose-Capillary: 190 mg/dL — ABNORMAL HIGH (ref 70–99)

## 2021-12-27 LAB — TROPONIN I (HIGH SENSITIVITY)
Troponin I (High Sensitivity): 50 ng/L — ABNORMAL HIGH (ref <18)
Troponin I (High Sensitivity): 1849 ng/L (ref <18)

## 2021-12-27 LAB — APTT: aPTT: 28 seconds (ref 24–36)

## 2021-12-27 LAB — PROTIME-INR
INR: 1 (ref 0.8–1.2)
Prothrombin Time: 12.8 seconds (ref 11.4–15.2)

## 2021-12-27 LAB — POCT ACTIVATED CLOTTING TIME
Activated Clotting Time: 257 seconds
Activated Clotting Time: 311 seconds

## 2021-12-27 SURGERY — CORONARY/GRAFT ACUTE MI REVASCULARIZATION
Anesthesia: Moderate Sedation

## 2021-12-27 MED ORDER — INSULIN ASPART 100 UNIT/ML IJ SOLN
0.0000 [IU] | Freq: Three times a day (TID) | INTRAMUSCULAR | Status: DC
  Administered 2021-12-28 – 2021-12-29 (×5): 3 [IU] via SUBCUTANEOUS
  Filled 2021-12-27 (×5): qty 1

## 2021-12-27 MED ORDER — VERAPAMIL HCL 2.5 MG/ML IV SOLN
INTRAVENOUS | Status: DC | PRN
  Administered 2021-12-27: 2.5 mg via INTRAVENOUS

## 2021-12-27 MED ORDER — NITROGLYCERIN IN D5W 200-5 MCG/ML-% IV SOLN
0.0000 ug/min | INTRAVENOUS | Status: DC
  Administered 2021-12-27: 15 ug/min via INTRAVENOUS

## 2021-12-27 MED ORDER — ROSUVASTATIN CALCIUM 10 MG PO TABS
5.0000 mg | ORAL_TABLET | Freq: Every day | ORAL | Status: DC
  Administered 2021-12-28 – 2021-12-29 (×2): 5 mg via ORAL
  Filled 2021-12-27 (×2): qty 1

## 2021-12-27 MED ORDER — ONDANSETRON HCL 4 MG/2ML IJ SOLN: 4.0000 mg | Freq: Four times a day (QID) | INTRAMUSCULAR | Status: DC | PRN

## 2021-12-27 MED ORDER — HEPARIN (PORCINE) IN NACL 1000-0.9 UT/500ML-% IV SOLN
INTRAVENOUS | Status: AC
  Filled 2021-12-27: qty 1000

## 2021-12-27 MED ORDER — ASPIRIN EC 81 MG PO TBEC
81.0000 mg | DELAYED_RELEASE_TABLET | Freq: Every day | ORAL | Status: DC
  Administered 2021-12-28 – 2021-12-29 (×2): 81 mg via ORAL
  Filled 2021-12-27 (×2): qty 1

## 2021-12-27 MED ORDER — SODIUM CHLORIDE 0.9 % IV SOLN
INTRAVENOUS | Status: AC | PRN
  Administered 2021-12-27: 20 mL via INTRAVENOUS

## 2021-12-27 MED ORDER — TICAGRELOR 90 MG PO TABS
ORAL_TABLET | ORAL | Status: AC
  Filled 2021-12-27: qty 2

## 2021-12-27 MED ORDER — LIDOCAINE HCL 1 % IJ SOLN
INTRAMUSCULAR | Status: AC
  Filled 2021-12-27: qty 20

## 2021-12-27 MED ORDER — NITROGLYCERIN 1 MG/10 ML FOR IR/CATH LAB
INTRA_ARTERIAL | Status: DC | PRN
  Administered 2021-12-27 (×2): 200 ug via INTRACORONARY

## 2021-12-27 MED ORDER — SODIUM CHLORIDE 0.9% FLUSH
3.0000 mL | Freq: Two times a day (BID) | INTRAVENOUS | Status: DC
  Administered 2021-12-28 – 2021-12-29 (×3): 3 mL via INTRAVENOUS

## 2021-12-27 MED ORDER — ASPIRIN 81 MG PO CHEW: 324.0000 mg | CHEWABLE_TABLET | Freq: Once | ORAL | Status: DC

## 2021-12-27 MED ORDER — VERAPAMIL HCL 2.5 MG/ML IV SOLN
INTRAVENOUS | Status: AC
  Filled 2021-12-27: qty 2

## 2021-12-27 MED ORDER — INSULIN ASPART 100 UNIT/ML IJ SOLN: 0.0000 [IU] | Freq: Every day | INTRAMUSCULAR | Status: DC

## 2021-12-27 MED ORDER — HEPARIN SODIUM (PORCINE) 1000 UNIT/ML IJ SOLN
INTRAMUSCULAR | Status: DC | PRN
  Administered 2021-12-27: 5000 [IU] via INTRAVENOUS

## 2021-12-27 MED ORDER — TIROFIBAN HCL IV 12.5 MG/250 ML
INTRAVENOUS | Status: AC | PRN
  Administered 2021-12-27: .15 ug/kg/min via INTRAVENOUS

## 2021-12-27 MED ORDER — HEPARIN (PORCINE) IN NACL 2000-0.9 UNIT/L-% IV SOLN
INTRAVENOUS | Status: DC | PRN
Start: 2021-12-27 — End: 2021-12-27
  Administered 2021-12-27: 1000 mL

## 2021-12-27 MED ORDER — LABETALOL HCL 5 MG/ML IV SOLN: 10.0000 mg | INTRAVENOUS | Status: AC | PRN

## 2021-12-27 MED ORDER — TIROFIBAN (AGGRASTAT) BOLUS VIA INFUSION
INTRAVENOUS | Status: DC | PRN
  Administered 2021-12-27: 2475 ug via INTRAVENOUS

## 2021-12-27 MED ORDER — ACETAMINOPHEN 325 MG PO TABS
650.0000 mg | ORAL_TABLET | ORAL | Status: DC | PRN
  Administered 2021-12-27: 650 mg via ORAL
  Filled 2021-12-27: qty 2

## 2021-12-27 MED ORDER — SODIUM CHLORIDE 0.9% FLUSH: 3.0000 mL | INTRAVENOUS | Status: DC | PRN

## 2021-12-27 MED ORDER — SPIRONOLACTONE 25 MG PO TABS
25.0000 mg | ORAL_TABLET | Freq: Every day | ORAL | Status: DC
  Administered 2021-12-28 – 2021-12-29 (×2): 25 mg via ORAL
  Filled 2021-12-27 (×2): qty 1

## 2021-12-27 MED ORDER — HEPARIN SODIUM (PORCINE) 5000 UNIT/ML IJ SOLN
4000.0000 [IU] | Freq: Once | INTRAMUSCULAR | Status: AC
Start: 2021-12-27 — End: 2021-12-27
  Administered 2021-12-27: 4000 [IU] via INTRAVENOUS

## 2021-12-27 MED ORDER — TIROFIBAN HCL IV 12.5 MG/250 ML
INTRAVENOUS | Status: AC
  Filled 2021-12-27: qty 250

## 2021-12-27 MED ORDER — FENTANYL CITRATE (PF) 100 MCG/2ML IJ SOLN
INTRAMUSCULAR | Status: DC | PRN
  Administered 2021-12-27: 50 ug via INTRAVENOUS
  Administered 2021-12-27 (×2): 25 ug via INTRAVENOUS

## 2021-12-27 MED ORDER — LIDOCAINE HCL (PF) 1 % IJ SOLN
INTRAMUSCULAR | Status: DC | PRN
  Administered 2021-12-27: 2 mL

## 2021-12-27 MED ORDER — CARVEDILOL 3.125 MG PO TABS
3.1250 mg | ORAL_TABLET | Freq: Two times a day (BID) | ORAL | Status: DC
  Administered 2021-12-27 – 2021-12-29 (×4): 3.125 mg via ORAL
  Filled 2021-12-27 (×4): qty 1

## 2021-12-27 MED ORDER — HEPARIN SODIUM (PORCINE) 1000 UNIT/ML IJ SOLN
INTRAMUSCULAR | Status: AC
  Filled 2021-12-27: qty 10

## 2021-12-27 MED ORDER — MIRABEGRON ER 50 MG PO TB24
50.0000 mg | ORAL_TABLET | Freq: Every day | ORAL | Status: DC
  Administered 2021-12-28: 50 mg via ORAL
  Filled 2021-12-27 (×2): qty 1

## 2021-12-27 MED ORDER — IOHEXOL 300 MG/ML  SOLN
INTRAMUSCULAR | Status: DC | PRN
  Administered 2021-12-27: 100 mL

## 2021-12-27 MED ORDER — SODIUM CHLORIDE 0.9 % IV SOLN: 250.0000 mL | INTRAVENOUS | Status: DC | PRN

## 2021-12-27 MED ORDER — HEPARIN (PORCINE) IN NACL 1000-0.9 UT/500ML-% IV SOLN
INTRAVENOUS | Status: DC | PRN
  Administered 2021-12-27: 500 mL

## 2021-12-27 MED ORDER — TICAGRELOR 90 MG PO TABS
ORAL_TABLET | ORAL | Status: DC | PRN
  Administered 2021-12-27: 180 mg via ORAL

## 2021-12-27 MED ORDER — TRAMADOL HCL 50 MG PO TABS
50.0000 mg | ORAL_TABLET | Freq: Once | ORAL | Status: AC
  Administered 2021-12-27: 50 mg via ORAL
  Filled 2021-12-27: qty 1

## 2021-12-27 MED ORDER — SODIUM CHLORIDE 0.9 % IV SOLN: INTRAVENOUS | Status: AC

## 2021-12-27 MED ORDER — MIDAZOLAM HCL 2 MG/2ML IJ SOLN
INTRAMUSCULAR | Status: DC | PRN
  Administered 2021-12-27 (×2): 1 mg via INTRAVENOUS

## 2021-12-27 MED ORDER — NITROGLYCERIN 1 MG/10 ML FOR IR/CATH LAB
INTRA_ARTERIAL | Status: AC
  Filled 2021-12-27: qty 10

## 2021-12-27 MED ORDER — FENTANYL CITRATE (PF) 100 MCG/2ML IJ SOLN
INTRAMUSCULAR | Status: AC
  Filled 2021-12-27: qty 2

## 2021-12-27 MED ORDER — OMEGA-3-ACID ETHYL ESTERS 1 G PO CAPS
2.0000 g | ORAL_CAPSULE | Freq: Two times a day (BID) | ORAL | Status: DC
  Administered 2021-12-28 – 2021-12-29 (×3): 2 g via ORAL
  Filled 2021-12-27 (×3): qty 2

## 2021-12-27 MED ORDER — NITROGLYCERIN IN D5W 200-5 MCG/ML-% IV SOLN
INTRAVENOUS | Status: AC | PRN
  Administered 2021-12-27: 10 ug/min via INTRAVENOUS

## 2021-12-27 MED ORDER — TICAGRELOR 90 MG PO TABS
90.0000 mg | ORAL_TABLET | Freq: Two times a day (BID) | ORAL | Status: DC
  Administered 2021-12-28 – 2021-12-29 (×3): 90 mg via ORAL
  Filled 2021-12-27 (×3): qty 1

## 2021-12-27 MED ORDER — MIDAZOLAM HCL 2 MG/2ML IJ SOLN
INTRAMUSCULAR | Status: AC
  Filled 2021-12-27: qty 2

## 2021-12-27 MED ORDER — HYDRALAZINE HCL 20 MG/ML IJ SOLN: 10.0000 mg | INTRAMUSCULAR | Status: AC | PRN

## 2021-12-27 MED ORDER — ENOXAPARIN SODIUM 40 MG/0.4ML IJ SOSY
40.0000 mg | PREFILLED_SYRINGE | INTRAMUSCULAR | Status: DC
  Administered 2021-12-28 – 2021-12-29 (×2): 40 mg via SUBCUTANEOUS
  Filled 2021-12-27 (×2): qty 0.4

## 2021-12-27 SURGICAL SUPPLY — 18 items
BALLN TREK RX 3.0X12 (BALLOONS) ×2
BALLOON TREK RX 3.0X12 (BALLOONS) IMPLANT
CATH INFINITI 5 FR JL3.5 (CATHETERS) ×1 IMPLANT
CATH INFINITI 5FR ANG PIGTAIL (CATHETERS) ×1 IMPLANT
CATH VISTA GUIDE 6FR JR4 (CATHETERS) ×1 IMPLANT
DEVICE RAD COMP TR BAND LRG (VASCULAR PRODUCTS) ×1 IMPLANT
DRAPE BRACHIAL (DRAPES) ×1 IMPLANT
GLIDESHEATH SLEND SS 6F .021 (SHEATH) ×1 IMPLANT
GUIDEWIRE INQWIRE 1.5J.035X260 (WIRE) IMPLANT
INQWIRE 1.5J .035X260CM (WIRE) ×2
KIT ENCORE 26 ADVANTAGE (KITS) ×1 IMPLANT
KIT SYRINGE INJ CVI SPIKEX1 (MISCELLANEOUS) ×1 IMPLANT
PACK CARDIAC CATH (CUSTOM PROCEDURE TRAY) ×2 IMPLANT
PROTECTION STATION PRESSURIZED (MISCELLANEOUS) ×2
SET ATX SIMPLICITY (MISCELLANEOUS) ×1 IMPLANT
STATION PROTECTION PRESSURIZED (MISCELLANEOUS) IMPLANT
STENT ONYX FRONTIER 5.0X22 (Permanent Stent) ×1 IMPLANT
WIRE RUNTHROUGH .014X180CM (WIRE) ×1 IMPLANT

## 2021-12-27 NOTE — ED Provider Notes (Signed)
? ?[facility] ?Provider Note ? ? ? Event Date/Time  ? First MD Initiated Contact with Patient 12/27/21 1746   ?  (approximate) ? ? ?History  ? ?Chest Pain ? ? ?HPI ? ?Casey Peters is a 76 y.o. male with a history of hypertension hyperlipidemia diabetes obesity and smoking who comes the ED complaining of a sudden onset of chest pain that started about 30 minutes prior to arrival while the patient was at rest.  Constant, severe, radiating to left arm, feels like heaviness.  Associated shortness of breath and diaphoresis.  No dizziness. ? ?He has no history of prior coronary events, stents, or bypass surgery.  Review of outside records does show that he had a cardiac MRI a few years ago demonstrating an EF of 19%.  He is on diuretics. ? ?  ? ? ?Physical Exam  ? ?Triage Vital Signs: ?ED Triage Vitals  ?Enc Vitals Group  ?   BP 12/27/21 1749 (!) 160/102  ?   Pulse Rate 12/27/21 1749 90  ?   Resp 12/27/21 1749 (!) 21  ?   Temp 12/27/21 1752 97.6 ?F (36.4 ?C)  ?   Temp Source 12/27/21 1752 Oral  ?   SpO2 12/27/21 1749 95 %  ?   Weight --   ?   Height --   ?   Head Circumference --   ?   Peak Flow --   ?   Pain Score --   ?   Pain Loc --   ?   Pain Edu? --   ?   Excl. in [place]? --   ? ? ?Most recent vital signs: ?Vitals:  ? 12/27/21 1820 12/27/21 1837  ?BP: (!) 154/93   ?Pulse: 91   ?Resp: 20   ?Temp:    ?SpO2: 99% 99%  ? ? ? ?General: Awake, ill-appearing.  ?CV:  Good peripheral perfusion.  Regular rate and rhythm ?Resp:  Normal effort.  Clear to auscultation bilaterally ?Abd:  No distention.  Soft and nontender ?Other:  PERRL, moist oral mucosa ? ? ?ED Results / Procedures / Treatments  ? ?Labs ?(all labs ordered are listed, but only abnormal results are displayed) ?Labs Reviewed  ?CBC WITH DIFFERENTIAL/PLATELET  ?PROTIME-INR  ?APTT  ?HEMOGLOBIN A1C  ?COMPREHENSIVE METABOLIC PANEL  ?LIPID PANEL  ?TROPONIN I (HIGH SENSITIVITY)  ? ? ? ?EKG ? ?Interpreted by me ?Sinus rhythm rate of 72.  Normal  axis, normal intervals.  Poor R wave progression.  ST elevation in lead III and aVF.  ST depression in lead I and aVL, worrisome for inferior STEMI.  ST changes are new compared to previous EKG on June 18, 2021. ? ? ?RADIOLOGY ?Chest x-ray viewed and interpreted by me, unremarkable.  No frank effusion or pulmonary edema.  No pneumothorax or pneumonia.  Radiology report reviewed ? ? ? ?PROCEDURES: ? ?Critical Care performed: Yes, see critical care procedure note(s) ? ?.Critical Care ?Performed by: Sharman Cheek, MD ?Authorized by: Sharman Cheek, MD  ? ?Critical care provider statement:  ?  Critical care time (minutes):  35 ?  Critical care time was exclusive of:  Separately billable procedures and treating other patients ?  Critical care was necessary to treat or prevent imminent or life-threatening deterioration of the following conditions:  Sepsis, respiratory failure and cardiac failure ?  Critical care was time spent personally by me on the following activities:  Development of treatment plan with patient or surrogate, discussions with consultants, evaluation of patient's  response to treatment, examination of patient, obtaining history from patient or surrogate, ordering and performing treatments and interventions, ordering and review of laboratory studies, ordering and review of radiographic studies, pulse oximetry, re-evaluation of patient's condition and review of old charts ?Comments:  ?    ? ? ?.1-3 Lead EKG Interpretation ?Performed by: Sharman Cheek, MD ?Authorized by: Sharman Cheek, MD  ? ?  Interpretation: normal   ?  ECG rate:  70 ?  ECG rate assessment: normal   ?  Rhythm: sinus rhythm   ?  Ectopy: none   ?  Conduction: normal   ? ? ?MEDICATIONS ORDERED IN ED: ?Medications  ?aspirin chewable tablet 324 mg ( Oral MAR Hold 12/27/21 1836)  ?lidocaine (PF) (XYLOCAINE) 1 % injection (2 mLs  Given 12/27/21 1841)  ?Heparin (Porcine) in NaCl 2000-0.9 UNIT/L-% SOLN (1,000 mLs  Given 12/27/21  1841)  ?heparin injection 4,000 Units (4,000 Units Intravenous Given 12/27/21 1759)  ? ? ? ?IMPRESSION / MDM / ASSESSMENT AND PLAN / ED COURSE  ?I reviewed the triage vital signs and the nursing notes. ?             ?               ? ?Patient presents with sudden onset of precordial chest pain.  Initial EKG worrisome for an inferior STEMI.  Case discussed with cardiology Dr. And who will come to the ED for evaluation for possible emergent catheterization.  We will give p.o. aspirin and IV heparin. ? ?**The patient is on the cardiac monitor to evaluate for evidence of arrhythmia and/or significant heart rate changes.**} ? ?Clinical Course as of 12/27/21 1842  ?Sat Dec 27, 2021  ?1802 Patient has received aspirin at home.  Heparin given. ?EKG with right-sided precordial leads confirms right heart STEMI.  Blood pressure is 160/100.  We will need to withhold nitrates, give IV fluids as needed for low blood pressure.  4 L nasal cannula oxygen applied. [PS]  ?  ?Clinical Course User Index ?[PS] Sharman Cheek, MD  ? ? ?----------------------------------------- ?6:42 PM on 12/27/2021 ?----------------------------------------- ?Patient left ED with cardiology to emergently to Cath Lab for PCI. ? ?FINAL CLINICAL IMPRESSION(S) / ED DIAGNOSES  ? ?Final diagnoses:  ?ST elevation myocardial infarction (STEMI), unspecified artery (HCC)  ? ? ? ?Rx / DC Orders  ? ?ED Discharge Orders   ? ? None  ? ?  ? ? ? ?Note:  This document was prepared using Dragon voice recognition software and may include unintentional dictation errors. ?  ?Sharman Cheek, MD ?12/27/21 1843 ? ?

## 2021-12-27 NOTE — H&P (Signed)
?History and Physical  ? ? ?Patient: Casey Peters KVQ:[id number] DOB: 12-Aug-1946 ?DOA: 12/27/2021 ?DOS: the patient was seen and examined on 12/27/2021 ?PCP: Gracelyn Nurse, MD  ?Patient coming from: Home ? ?Chief Complaint:  ?Chief Complaint  ?Patient presents with  ? Chest Pain  ? ?HPI: Casey Peters is a 76 y.o. male with medical history significant of Dilated cardiomyopathy, HTN, hyperlipidemia/hypertriglyceridemia, type 2 diabetes, admitted to the hospitalist service status postcardiac cath after arriving as a code STEMI.  Patient had chest pain that developed 30 minutes prior to arrival.  He was taken to the Cath Lab where he underwent uneventful placement of a stent in the proximal RCA for 95% occlusion.  He had some residual chest pain believed secondary to plaque fragments.  He was transferred from the Cath Lab to the ICU on nitroglycerin infusion to titrate to pain and Aggrastat.   ?Vitals at the time of transfer to the ICU, within normal limits.  Blood work earlier significant for troponin of 50.  Lipid panel significant for total cholesterol 322 and triglycerides 5095.  EKG as interpreted by cardiologist and ED provider showed inferior STEMI.  Chest x-ray showed possible mild interstitial pulmonary edema  ?Review of Systems: As mentioned in the history of present illness. All other systems reviewed and are negative. ?Past Medical History:  ?Diagnosis Date  ? Cardiomegaly   ? CHF (congestive heart failure) (HCC)   ? Constipation   ? Diabetic polyneuropathy (HCC)   ? Dilated idiopathic cardiomyopathy (HCC)   ? Erectile dysfunction   ? HLD (hyperlipidemia)   ? HTN (hypertension)   ? Low testosterone   ? Palpitations   ? Pneumonia   ? T2DM (type 2 diabetes mellitus) (HCC)   ? ?Past Surgical History:  ?Procedure Laterality Date  ? CYSTOSCOPY WITH URETHRAL DILATATION N/A 06/17/2021  ? Procedure: CYSTOSCOPY WITH URETHRAL DILATATION;  Surgeon: [name], MD;  Location: [facility] ORS;  Service:  Urology;  Laterality: N/A;  Optilume Balloon  ? HERNIA REPAIR  1998  ? PILONIDAL CYST EXCISION    ? TONSILLECTOMY    ? URETHRA SURGERY  1995  ? ?Social History:  reports that he has been smoking cigars. He has never used smokeless tobacco. He reports that he does not drink alcohol and does not use drugs. ? ?Allergies  ?Allergen Reactions  ? Sulfamethoxazole-Trimethoprim   ?  Fatigue and muscle ache  ? ? ?Family History  ?Problem Relation Age of Onset  ? Hematuria Father   ? Kidney disease Neg Hx   ? Prostate cancer Neg Hx   ? ? ?Prior to Admission medications   ?Medication Sig Start Date End Date Taking? Authorizing Provider  ?aspirin EC 81 MG tablet Take 81 mg by mouth daily.    [provider]  ?carvedilol (COREG) 3.125 MG tablet Take 3.125 mg by mouth 2 (two) times daily. 05/19/18   [provider]  ?cholecalciferol (VITAMIN D3) 25 MCG (1000 UNIT) tablet Take 1,000 Units by mouth daily.    [provider]  ?furosemide (LASIX) 20 MG tablet Take 20 mg by mouth 2 (two) times daily. 06/07/18   [provider]  ?meloxicam (MOBIC) 7.5 MG tablet  08/27/21   [provider]  ?metFORMIN (GLUCOPHAGE) 500 MG tablet Take 1 tablet by mouth 2 (two) times daily with a meal. 10/14/21   [provider]  ?mirabegron ER (MYRBETRIQ) 50 MG TB24 tablet Take 1 tablet (50 mg total) by mouth daily. 08/13/21   Stoioff,  Verna CzechScott C, MD  ?nitrofurantoin, macrocrystal-monohydrate, (MACROBID) 100 MG capsule Take 1 capsule (100 mg total) by mouth 2 (two) times daily. 12/18/21   Bing NeighborsHarris, Kimberly S, FNP  ?ONE TOUCH ULTRA TEST test strip USE 1 STRIP 3 TIMES A DAY TO CHECK BLOOD GLUCOSE LEVELS 09/24/18   [provider]  ?simvastatin (ZOCOR) 10 MG tablet Take 10 mg by mouth at bedtime. 03/19/21   [provider]  ?spironolactone (ALDACTONE) 25 MG tablet Take 25 mg by mouth daily. 02/21/18 06/05/21  [provider]  ?testosterone (ANDROGEL) 50 MG/5GM (1%) GEL Apply 5 g topically  daily. 04/26/18   [provider]  ?vitamin C (ASCORBIC ACID) 500 MG tablet Take 500 mg by mouth daily.    [provider]  ?Zinc Gluconate 30 MG TABS Take 30 mg by mouth daily.    [provider]  ? ? ?Physical Exam: ?Vitals:  ? 12/27/21 1917 12/27/21 1922 12/27/21 1927 12/27/21 1958  ?BP: 131/87 135/85 137/83 138/88  ?Pulse: 90 92 91   ?Resp: 18 (!) 25 19   ?Temp:    98.3 ?F (36.8 ?C)  ?TempSrc:    Oral  ?SpO2: 97% 97% 97%   ?Weight:      ?Height:      ? ?Physical Exam ?Vitals and nursing note reviewed.  ?Constitutional:   ?   General: He is not in acute distress. ?   Appearance: Normal appearance.  ?HENT:  ?   Head: Normocephalic and atraumatic.  ?Cardiovascular:  ?   Rate and Rhythm: Normal rate and regular rhythm.  ?   Pulses: Normal pulses.  ?   Heart sounds: Normal heart sounds. No murmur heard. ?Pulmonary:  ?   Effort: Pulmonary effort is normal.  ?   Breath sounds: Normal breath sounds. No wheezing or rhonchi.  ?Abdominal:  ?   General: Bowel sounds are normal.  ?   Palpations: Abdomen is soft.  ?   Tenderness: There is no abdominal tenderness.  ?Musculoskeletal:     ?   General: No swelling or tenderness. Normal range of motion.  ?   Cervical back: Normal range of motion and neck supple.  ?Skin: ?   General: Skin is warm and dry.  ?Neurological:  ?   General: No focal deficit present.  ?   Mental Status: He is alert and oriented to person, place, and time. Mental status is at baseline.  ?Psychiatric:     ?   Mood and Affect: Mood normal.     ?   Behavior: Behavior normal.  ? ? ?Data Reviewed: ?Data Reviewed: ?Relevant notes from primary care and specialist visits, past discharge summaries as available in EHR, including Care Everywhere. ?Prior diagnostic testing as pertinent to current admission diagnoses ?Updated medications and problem lists for reconciliation ?ED course, including vitals, labs, imaging, treatment and response to treatment ?Triage notes, nursing and pharmacy  notes and ED provider's notes ?Notable results as noted in HPI ? ? ? ?Assessment and Plan: ?* STEMI involving right coronary artery (HCC) ?S/p cath with stent to RCA. ?Has residual post cath pain and is on nitroglycerin infusion ?Post-cath orders per Dr. Cristal Deerhristopher End ?Continues on carvedilol, rosuvastatin.  As needed labetalol and hydralazine for SBP over 150.  On Aggrastat ? ?S/P cardiac catheterization ?Management as above ? ?HFrEF (heart failure with reduced ejection fraction) (HCC) ?HFrEF with improved EF.  Previously 20%- 30 to 35%- 45%(estimated cardiac cath 12/27/2021) ?Secondary to dilated cardiomyopathy, previously presumed nonischemic per review of cardiology  note ?Continue carvedilol, spironolactone ?Chest x-ray showed mild pulmonary vascular congestion, likely secondary to STEMI ?Daily weights and continue to monitor for decompensation ? ?Shoulder pain ?Patient complaining of worsening his chronic bilateral shoulder pain following his procedure ?No improvement with Tylenol.  Tramadol ordered. ? ? ?Hypertriglyceridemia ?Continue rosuvastatin.  Triglyceride level over 1500 ? ?Dilated idiopathic cardiomyopathy (HCC) ?See management above under HFrEF ? ?Controlled type 2 diabetes mellitus without complication (HCC) ?Sliding scale insulin coverage ? ? ? ? ? Advance Care Planning:   Code Status: Full Code  ? ?Consults: [facility] cardiology Dr. Gwen Pounds ? ?Family Communication: Wife and daughter at bedside ? ?Severity of Illness: ?The appropriate patient status for this patient is INPATIENT. Inpatient status is judged to be reasonable and necessary in order to provide the required intensity of service to ensure the patient's safety. The patient's presenting symptoms, physical exam findings, and initial radiographic and laboratory data in the context of their chronic comorbidities is felt to place them at high risk for further clinical deterioration. Furthermore, it is not anticipated that the patient will be  medically stable for discharge from the hospital within 2 midnights of admission.  ? ?* I certify that at the point of admission it is my clinical judgment that the patient will require inpatient hospital care spanning b

## 2021-12-27 NOTE — ED Notes (Signed)
Dr. Saunders [facility] at bedside. ?

## 2021-12-27 NOTE — Progress Notes (Signed)
Troponin called into Dr End . Troponin 1849 ?

## 2021-12-27 NOTE — Assessment & Plan Note (Signed)
Sliding scale insulin coverage 

## 2021-12-27 NOTE — ED Notes (Signed)
X-ray at bedside

## 2021-12-27 NOTE — Assessment & Plan Note (Signed)
Continue rosuvastatin.  Triglyceride level over 1500 ?

## 2021-12-27 NOTE — Consult Note (Signed)
Chaplain met with patients wife in the waiting area. Wife is calm and receptive to [place] visit. Wife shared ?pt. has a weak heart,? but this is the first episode, so she is a bit disheveled. Wife is awaiting children to arrive to hospital. Chaplain offered compassionate presence and support.  ?

## 2021-12-27 NOTE — ED Notes (Signed)
Cardiology, Dr. Okey Dupre at bedside  ?

## 2021-12-27 NOTE — ED Triage Notes (Signed)
Patient to ER via POV with complaints of centralized, sharp chest pain that started 20-30 minutes ago. Wife gave 1 81mg  ASA.  ? ?Wife states patient has been started on new meds and had a UTI.  ?Pt is A&OX4 but looks uncomfortable.  ?

## 2021-12-27 NOTE — ED Notes (Addendum)
CODE STEMI CALLED. 

## 2021-12-27 NOTE — Assessment & Plan Note (Addendum)
S/p cath with stent to RCA. ?Has residual post cath pain and is on nitroglycerin infusion ?Post-cath orders per Dr. Cristal Deer End ?Continues on carvedilol, rosuvastatin.  As needed labetalol and hydralazine for SBP over 150.  On Aggrastat ?

## 2021-12-27 NOTE — Assessment & Plan Note (Signed)
See management above under HFrEF ?

## 2021-12-27 NOTE — ED Notes (Signed)
Pt incontinent of bowel. RN and ED staff provided peri care at this time. ?

## 2021-12-27 NOTE — Consult Note (Signed)
?Cardiology Consultation:  ? ?Patient ID: Casey Peters ?MRN: [id number]; DOB: 06-25-1946 ? ?Admit date: 12/27/2021 ?Date of Consult: 12/27/2021 ? ?PCP:  Baxter Hire, MD ?  ?[facility] Providers ?Cardiologist:  Donnelly Angelica, MD   ? ? ?Patient Profile:  ? ?Casey Peters is a 76 y.o. male with a hx of dilated cardiomyopathy (presumed to be nonischemic by cardiac MRI), hypertension, hyperlipidemia, and type 2 diabetes mellitus, who is being seen 12/27/2021 for the evaluation of chest pain and abnormal EKG at the request of Dr. Joni Fears. ? ?History of Present Illness:  ? ?Casey Peters was in his usual state of health until shortly after 5 PM when he developed sudden onset of chest pain/pressure radiating to the left arm associated with diaphoresis.  He came took 324 mg of aspirin and proceeded to the ED with his wife where his EKG showed inferior ST segment elevation.  His chest pain was up to 6/10 in intensity before reaching the ED but had improved to 2/10 when I first met him.  His wife reports that he had been feeling intermittent pain in the left shoulder over the last week or two, though this was different than what he experienced today.  He was diagnosed with dilated cardiomyopathy with an LVEF as low as 19% by MRI in 2019.  As no scarring was seen on the MRI, cardiomyopathy was presumed to be non-ischemic.  LVEF normalized with GDMT, most recently > 55% by echo in 03/2021. ? ?Mr. Sickinger was referred for emergent cardiac catheterization with possible PCI.  This showed acute plaque rupture in a large, dominant RCA with 95% stenosis.  This was treated with a single DES with embolization of thrombus/plaque into tiny distal branches.  He was placed on NTG infusion with improvement in residual pain (1/10 chest and bilateral arm pain at conclusion of the procedure). ? ? ?Past Medical History:  ?Diagnosis Date  ? Cardiomegaly   ? CHF (congestive heart failure) ([place])   ? Constipation   ? Diabetic  polyneuropathy ([place])   ? Dilated idiopathic cardiomyopathy ([place])   ? Erectile dysfunction   ? HLD (hyperlipidemia)   ? HTN (hypertension)   ? Low testosterone   ? Palpitations   ? Pneumonia   ? T2DM (type 2 diabetes mellitus) ([place])   ? ? ?Past Surgical History:  ?Procedure Laterality Date  ? CYSTOSCOPY WITH URETHRAL DILATATION N/A 06/17/2021  ? Procedure: CYSTOSCOPY WITH URETHRAL DILATATION;  Surgeon: Abbie Sons, MD;  Location: [facility] ORS;  Service: Urology;  Laterality: N/A;  Optilume Balloon  ? HERNIA REPAIR  1998  ? PILONIDAL CYST EXCISION    ? TONSILLECTOMY    ? [place]  ?  ? ?Home Medications:  ?Prior to Admission medications   ?Medication Sig Start Date Markelle Najarian Date Taking? Authorizing Provider  ?aspirin EC 81 MG tablet Take 81 mg by mouth daily.    [provider]  ?carvedilol (COREG) 3.125 MG tablet Take 3.125 mg by mouth 2 (two) times daily. 05/19/18   [provider]  ?cholecalciferol (VITAMIN D3) 25 MCG (1000 UNIT) tablet Take 1,000 Units by mouth daily.    [provider]  ?furosemide (LASIX) 20 MG tablet Take 20 mg by mouth 2 (two) times daily. 06/07/18   [provider]  ?meloxicam (MOBIC) 7.5 MG tablet  08/27/21   [provider]  ?metFORMIN (GLUCOPHAGE) 500 MG tablet Take 1 tablet by mouth 2 (two) times daily with a meal. 10/14/21  [provider]  ?mirabegron ER (MYRBETRIQ) 50 MG TB24 tablet Take 1 tablet (50 mg total) by mouth daily. 08/13/21   Stoioff, Ronda Fairly, MD  ?nitrofurantoin, macrocrystal-monohydrate, (MACROBID) 100 MG capsule Take 1 capsule (100 mg total) by mouth 2 (two) times daily. 12/18/21   Scot Jun, FNP  ?ONE TOUCH ULTRA TEST test strip USE 1 STRIP 3 TIMES A DAY TO CHECK BLOOD GLUCOSE LEVELS 09/24/18   [provider]  ?simvastatin (ZOCOR) 10 MG tablet Take 10 mg by mouth at bedtime. 03/19/21   [provider]  ?spironolactone (ALDACTONE) 25 MG tablet Take 25 mg by mouth daily. 02/21/18 06/05/21   [provider]  ?testosterone (ANDROGEL) 50 MG/5GM (1%) GEL Apply 5 g topically daily. 04/26/18   [provider]  ?vitamin C (ASCORBIC ACID) 500 MG tablet Take 500 mg by mouth daily.    [provider]  ?Zinc Gluconate 30 MG TABS Take 30 mg by mouth daily.    [provider]  ? ? ?Inpatient Medications: ?Scheduled Meds: ? [START ON 12/28/2021] aspirin EC  81 mg Oral Daily  ? carvedilol  3.125 mg Oral BID WC  ? [START ON 12/28/2021] enoxaparin (LOVENOX) injection  40 mg Subcutaneous Q24H  ? [START ON 12/28/2021] insulin aspart  0-15 Units Subcutaneous TID WC  ? insulin aspart  0-5 Units Subcutaneous QHS  ? [START ON 12/28/2021] mirabegron ER  50 mg Oral Daily  ? [START ON 12/28/2021] omega-3 acid ethyl esters  2 g Oral BID WC  ? rosuvastatin  5 mg Oral Daily  ? [START ON 12/28/2021] sodium chloride flush  3 mL Intravenous Q12H  ? [START ON 12/28/2021] spironolactone  25 mg Oral Daily  ? [START ON 12/28/2021] ticagrelor  90 mg Oral BID  ? ?Continuous Infusions: ? sodium chloride    ? sodium chloride    ? nitroGLYCERIN    ? ?PRN Meds: ? ? ?Allergies:    ?Allergies  ?Allergen Reactions  ? Sulfamethoxazole-Trimethoprim   ?  Fatigue and muscle ache  ? ? ?Social History:   ?Social History  ? ?Tobacco Use  ? Smoking status: Every Day  ?  Types: Cigars  ? Smokeless tobacco: Never  ? Tobacco comments:  ?  2-3 cigars per day  ?Vaping Use  ? Vaping Use: Never used  ?Substance Use Topics  ? Alcohol use: No  ?  Alcohol/week: 0.0 standard drinks  ? Drug use: No  ? ?  ?Family History:   ?Father has history of 3 MI's as well as heart failure and valvular heart disease. ? ?ROS:  ?Please see the history of present illness. All other ROS reviewed and negative.    ? ?Physical Exam/Data:  ? ?Vitals:  ? 12/27/21 1917 12/27/21 1922 12/27/21 1927 12/27/21 1958  ?BP: 131/87 135/85 137/83 138/88  ?Pulse: 90 92 91   ?Resp: 18 (!) 25 19   ?Temp:    98.3 ?F (36.8 ?C)  ?TempSrc:    Oral  ?SpO2: 97% 97% 97%   ?Weight:       ?Height:      ? ?No intake or output data in the 24 hours ending 12/27/21 2035 ?Last 3 Weights 12/27/2021 08/27/2021 08/13/2021  ?Weight (lbs) 218 lb 4.8 oz 206 lb 206 lb  ?Weight (kg) 99.02 kg 93.441 kg 93.441 kg  ?   ?Body mass index is 30.45 kg/m?.  ?General:  Uncomfortable-appearing man lying on stretcher in ED.  His wife is at the bedside. ?HEENT: normal ?Neck:  no JVD ?Vascular: No carotid bruits; Distal pulses 2+ bilaterally ?Cardiac:  normal S1, S2; RRR; no murmurs, rubs, or gallops ?Lungs:  clear to auscultation bilaterally, no wheezing, rhonchi or rales  ?Abd: soft, nontender, no hepatomegaly  ?Ext: no edema ?Musculoskeletal:  No deformities, BUE and BLE strength normal and equal ?Skin: warm and dry  ?Neuro:  CNs 2-12 intact, no focal abnormalities noted ?Psych:  Normal affect  ? ?EKG:  The EKG was personally reviewed and demonstrates:  Normal sinus rhythm with inferolateral ST elevation and poor R wave progression. ?Telemetry:  Telemetry was personally reviewed and demonstrates:  Sinus rhythm with PVC's. ? ?Relevant CV Studies: ?LHC/PCI (12/27/2021): ?Severe single vessel coronary artery disease with acute plaque rupture and thrombus formation in the mid LAD with 95% stenosis.  There is mild-moderate, non-obstructive disease noted in the distal RCA/rPDA as well as the left coronary artery. ?Mildly reduced left ventricular contraction (LVEF 45-50%) with inferior hypokineses and mildly elevated filling pressure (LVEDP 20 mmHg). ?Successful primary PCI to mid RCA using Onyx Frontier 5.0 x 22 mm drug-eluting stent with 0% residual stenosis and TIMI-3 flow.  Embolization into the distal portions of rPDA and rPL3 was noted; these branches are too small for PCI. ? ?Laboratory Data: ? ?High Sensitivity Troponin:   ?Recent Labs  ?Lab 12/27/21 ?1747  ?TROPONINIHS 50*  ?   ?Chemistry ?Recent Labs  ?Lab 12/27/21 ?1747  ?NA 139  ?K 4.7  ?CL 100  ?CO2 23  ?GLUCOSE 141*  ?BUN 30*  ?CREATININE 1.06  ?CALCIUM 9.5   ?GFRNONAA >60  ?ANIONGAP 16*  ?  ?Recent Labs  ?Lab 12/27/21 ?1747  ?PROT 7.8  ?ALBUMIN 4.0  ?AST 30  ?ALT 28  ?ALKPHOS 59  ?BILITOT 1.4*  ? ?Lipids  ?Recent Labs  ?Lab 12/27/21 ?1747  ?CHOL 322*  ?TRIG 1,595*  ?

## 2021-12-27 NOTE — Assessment & Plan Note (Signed)
Management as above °

## 2021-12-27 NOTE — Assessment & Plan Note (Addendum)
HFrEF with improved EF.  Previously 20%- 30 to 35%- 45%(estimated cardiac cath 12/27/2021) ?Secondary to dilated cardiomyopathy, previously presumed nonischemic per review of cardiology note ?Continue carvedilol, spironolactone ?Chest x-ray showed mild pulmonary vascular congestion, likely secondary to STEMI ?Daily weights and continue to monitor for decompensation ?

## 2021-12-28 ENCOUNTER — Inpatient Hospital Stay
Admit: 2021-12-28 | Discharge: 2021-12-28 | Disposition: A | Discharge status: Home / Self Care | Payer: Medicare PPO | Attending: Internal Medicine | Admitting: Internal Medicine

## 2021-12-28 DIAGNOSIS — I2111 ST elevation (STEMI) myocardial infarction involving right coronary artery: Secondary | ICD-10-CM | POA: Diagnosis not present

## 2021-12-28 DIAGNOSIS — M25519 Pain in unspecified shoulder: Secondary | ICD-10-CM

## 2021-12-28 LAB — ECHOCARDIOGRAM COMPLETE
AV Peak grad: 2.8 mmHg
Ao pk vel: 0.84 m/s
Area-P 1/2: 4.77 cm2
Height: 71 in
S' Lateral: 3.5 cm
Single Plane A4C EF: 61.4 %
Weight: 3492.8 oz

## 2021-12-28 LAB — GLUCOSE, CAPILLARY
Glucose-Capillary: 185 mg/dL — ABNORMAL HIGH (ref 70–99)
Glucose-Capillary: 156 mg/dL — ABNORMAL HIGH (ref 70–99)
Glucose-Capillary: 164 mg/dL — ABNORMAL HIGH (ref 70–99)
Glucose-Capillary: 147 mg/dL — ABNORMAL HIGH (ref 70–99)

## 2021-12-28 LAB — BASIC METABOLIC PANEL
Anion gap: 12 (ref 5–15)
BUN: 29 mg/dL — ABNORMAL HIGH (ref 8–23)
CO2: 23 mmol/L (ref 22–32)
Calcium: 9.3 mg/dL (ref 8.9–10.3)
Chloride: 103 mmol/L (ref 98–111)
Creatinine, Ser: 1.02 mg/dL (ref 0.61–1.24)
GFR, Estimated: >60 mL/min (ref >60)
Glucose, Bld: 145 mg/dL — ABNORMAL HIGH (ref 70–99)
Potassium: 4.1 mmol/L (ref 3.5–5.1)
Sodium: 138 mmol/L (ref 135–145)

## 2021-12-28 LAB — CBC
HCT: 44.8 % (ref 39.0–52.0)
Hemoglobin: 15 g/dL (ref 13.0–17.0)
MCH: 30.4 pg (ref 26.0–34.0)
MCHC: 33.5 g/dL (ref 30.0–36.0)
MCV: 90.9 fL (ref 80.0–100.0)
Platelets: 182 10*3/uL (ref 150–400)
RBC: 4.93 MIL/uL (ref 4.22–5.81)
RDW: 13.4 % (ref 11.5–15.5)
WBC: 10.3 10*3/uL (ref 4.0–10.5)
nRBC: 0 % (ref 0.0–0.2)

## 2021-12-28 LAB — LDL CHOLESTEROL, DIRECT: Direct LDL: 49.8 mg/dL (ref 0–99)

## 2021-12-28 LAB — TROPONIN I (HIGH SENSITIVITY): Troponin I (High Sensitivity): 6944 ng/L (ref <18)

## 2021-12-28 MED ORDER — EZETIMIBE 10 MG PO TABS
10.0000 mg | ORAL_TABLET | Freq: Every day | ORAL | Status: DC
  Administered 2021-12-28 – 2021-12-29 (×2): 10 mg via ORAL
  Filled 2021-12-28 (×3): qty 1

## 2021-12-28 NOTE — Progress Notes (Signed)
*  PRELIMINARY RESULTS* ?Echocardiogram ?2D Echocardiogram has been performed. ? ?Casey Peters ?12/28/2021, 12:10 PM ?

## 2021-12-28 NOTE — Progress Notes (Signed)
[facility] Progress Note ? ?Patient: Casey Peters    QQV:[id number]  DOA: 12/27/2021    ? ?Date of Service: the patient was seen and examined on 12/28/2021 ? ?Chief Complaint  ?Patient presents with  ? Chest Pain  ? ?Brief hospital course: ?Casey Peters is a 75 y.o. male with medical history significant of Dilated cardiomyopathy, HTN, hyperlipidemia/hypertriglyceridemia, type 2 diabetes, admitted to the hospitalist service status postcardiac cath after arriving as a code STEMI.  Patient had chest pain that developed 30 minutes prior to arrival.  He was taken to the Cath Lab where he underwent uneventful placement of a stent in the proximal RCA for 95% occlusion.  He had some residual chest pain believed secondary to plaque fragments.  He was transferred from the Cath Lab to the ICU on nitroglycerin infusion to titrate to pain and Aggrastat.   ?Vitals at the time of transfer to the ICU, within normal limits.  Blood work earlier significant for troponin of 50.  Lipid panel significant for total cholesterol 322 and triglycerides 5095.  EKG as interpreted by cardiologist and ED provider showed inferior STEMI.  Chest x-ray showed possible mild interstitial pulmonary edema  ? ? ?Assessment and Plan: ?* STEMI involving right coronary artery ?S/p cath with stent to RCA. ?Had residual post cath pain and s/p Nitroglycerin infusion ?Post-cath orders per Dr. Cristal Deer End ?Continue aspirin 81 mg, Brilinta 90 twice daily ?Continues on carvedilol, rosuvastatin ?  ? ?  ?HFrEF (heart failure with reduced ejection fraction), Dilated idiopathic cardiomyopathy  ?HFrEF with improved EF.  Previously 20%- 30 to 35%- 45%(estimated cardiac cath 12/27/2021) ?Secondary to dilated cardiomyopathy, previously presumed nonischemic per review of cardiology note ?Continue carvedilol, spironolactone ?Chest x-ray showed mild pulmonary vascular congestion, likely secondary to STEMI ?Daily weights and continue to monitor for  decompensation ?  ?Shoulder pain ?Patient complaining of worsening his chronic bilateral shoulder pain following his procedure ?No improvement with Tylenol.  Tramadol ordered. ?  ?  ?Hypertriglyceridemia ?Continue rosuvastatin.  Triglyceride level over 1500 ?  ?  ?Controlled type 2 diabetes mellitus without complication (HCC) ?Sliding scale insulin coverage ?Monitor FSBG and continue diabetic diet ? ?Body mass index is 30.45 kg/m?.  ?Interventions: ?    ?Diet: Heart healthy/carb modified ?DVT Prophylaxis: Subcutaneous Lovenox  ? ?Advance goals of care discussion: Full code ? ?Family Communication: family was present at bedside, at the time of interview.  ?The pt provided permission to discuss medical plan with the family. Opportunity was given to ask question and all questions were answered satisfactorily.  ? ?Disposition:  ?Pt is from Home, admitted with chest pain, NSTEMI s/p cardiac cath, still has generalized weak, which precludes a safe discharge. ?Discharge to Home, most likely tomorrow a.m. if remains asymptomatic and cleared by cardiology ? ?Subjective: No significant events overnight, chest pain has been resolved, patient just feels tired, no any other active issues no palpitations, no shortness of breath. ? ? ?Physical Exam: ?General:  lethargic oriented to time, place, and person.  ?Appear in mild distress, affect appropriate ?Eyes: PERRLA ?ENT: Oral Mucosa Clear, moist  ?Neck: no JVD,  ?Cardiovascular: S1 and S2 Present, no Murmur,  ?Respiratory: good respiratory effort, Bilateral Air entry equal and Decreased, no Crackles, no wheezes ?Abdomen: Bowel Sound present, Soft and no tenderness,  ?Skin: no rashes ?Extremities: no Pedal edema, no calf tenderness ?Neurologic: without any new focal findings ?Gait not checked due to patient safety concerns ? ?Vitals:  ? 12/28/21 0500 12/28/21 0647 12/28/21 0700 12/28/21  1055  ?BP: (!) 159/86 (!) 163/82 (!) 148/71 140/86  ?Pulse: 80 89 91 91  ?Resp: 17  16 18    ?Temp:   98.8 ?F (37.1 ?C)   ?TempSrc:   Oral   ?SpO2: 92%  98% 98%  ?Weight:      ?Height:      ? ? ?Intake/Output Summary (Last 24 hours) at 12/28/2021 1511 ?Last data filed at 12/28/2021 0500 ?Gross per 24 hour  ?Intake 98.43 ml  ?Output 925 ml  ?Net -826.57 ml  ? ?Filed Weights  ? 12/27/21 1807  ?Weight: 99 kg  ? ? ?Data Reviewed: ?I have personally reviewed and interpreted daily labs, tele strips, imagings as discussed above. ?I reviewed all nursing notes, pharmacy notes, vitals, pertinent old records ?I have discussed plan of care as described above with RN and patient/family. ? ?CBC: ?Recent Labs  ?Lab 12/27/21 ?1747 12/28/21 ?0405  ?WBC 9.4 10.3  ?NEUTROABS 5.5  --   ?HGB 15.6 15.0  ?HCT 47.4 44.8  ?MCV 89.6 90.9  ?PLT 214 182  ? ?Basic Metabolic Panel: ?Recent Labs  ?Lab 12/27/21 ?1747 12/28/21 ?0405  ?NA 139 138  ?K 4.7 4.1  ?CL 100 103  ?CO2 23 23  ?GLUCOSE 141* 145*  ?BUN 30* 29*  ?CREATININE 1.06 1.02  ?CALCIUM 9.5 9.3  ? ? ?Studies: ?CARDIAC CATHETERIZATION ? ?Result Date: 12/27/2021 ?Conclusions: Severe single vessel coronary artery disease with acute plaque rupture and thrombus formation in the mid LAD with 95% stenosis.  There is mild-moderate, non-obstructive disease noted in the distal RCA/rPDA as well as the left coronary artery. Mildly reduced left ventricular contraction (LVEF 45-50%) with inferior hypokineses and mildly elevated filling pressure (LVEDP 20 mmHg). Successful primary PCI to mid RCA using Onyx Frontier 5.0 x 22 mm drug-eluting stent with 0% residual stenosis and TIMI-3 flow.  Embolization into the distal portions of rPDA and rPL3 was noted; these branches are too small for PCI. Recommendations: Dual antiplatelet therapy with aspirin and ticagrelor for at least 12 months. Continue tirofiban infusion for 4 hours. Aggressive secondary prevention.  I will rechallenge Mr. Cifuentes with rosuvastatin 5 mg daily and continue ezetimibe 10 mg daily, given history of statin intolerance.   Aggressive triglyceride-lowering therapy will need to be pursued. Obtain echocardiogram. Escalate GDMT for cardiomyopathy as tolerated. Yvonne Kendallhristopher End, MD [facility] ? ?DG Chest Portable 1 View ? ?Result Date: 12/27/2021 ?CLINICAL DATA:  Chest pain. EXAM: PORTABLE CHEST 1 VIEW COMPARISON:  Chest two views 08/02/2020 FINDINGS: Single frontal view of the chest. Overlying possible cardiac pacer or defibrillator pad over the superomedial left hemithorax. Cardiac silhouette is mildly enlarged. Mediastinal contours are within normal limits. Possible minimal interstitial thickening compared to prior. No definite pleural effusion. No pneumothorax. Moderate multilevel degenerative disc changes of the thoracic spine. IMPRESSION: Possible mild interstitial pulmonary edema. Electronically Signed   By: Neita Garnetonald  Viola M.D.   On: 12/27/2021 18:07  ? ?ECHOCARDIOGRAM COMPLETE ? ?Result Date: 12/28/2021 ?   ECHOCARDIOGRAM REPORT   Patient Name:   Casey Peters Date of Exam: 12/28/2021 Medical Rec #:  [id number]            Height:       71.0 in Accession #:    [id number]           Weight:       218.3 lb Date of Birth:  1946/02/29            BSA:  2.188 m? Patient Age:    76 years             BP:           148/71 mmHg Patient Gender: M                    HR:           88 bpm. Exam Location:  [facility] Procedure: 2D Echo Indications:     AMI I21.9  History:         Patient has no prior history of Echocardiogram examinations.  Sonographer:     Overton Mam RDCS Referring Phys:  [id number] Andris Baumann Diagnosing Phys: Arnoldo Hooker MD  Sonographer Comments: No subcostal window. Image acquisition challenging due to patient body habitus. IMPRESSIONS  1. Left ventricular ejection fraction, by estimation, is 45 to 50%. The left ventricle has mildly decreased function. The left ventricle demonstrates regional wall motion abnormalities (see scoring diagram/findings for description). Left ventricular diastolic parameters were  normal.  2. Right ventricular systolic function is normal. The right ventricular size is normal.  3. The mitral valve is normal in structure. Mild mitral valve regurgitation.  4. The aortic valve is normal in structur

## 2021-12-28 NOTE — Progress Notes (Addendum)
[facility] Cardiology consultation ? ?Patient: Casey Peters / Admit Date: 12/27/2021 / Date of Encounter: 12/28/2021, 8:42 AM ? ? ?Subjective: ?Patient overall feels better than yesterday.  No further episodes of chest pain.  Patient tolerating medication management at this time.  Patient has not ambulated yet ? ?Review of Systems: ?Positive for: None ?Negative for: Vision change, hearing change, syncope, dizziness, nausea, vomiting,diarrhea, bloody stool, stomach pain, cough, congestion, diaphoresis, urinary frequency, urinary pain,skin lesions, skin rashes ?Others previously listed ? ?Objective: ?Telemetry: Normal sinus rhythm ?Physical Exam: Blood pressure (!) 148/71, pulse 91, temperature 98.8 ?F (37.1 ?C), temperature source Oral, resp. rate 16, height 5\' 11"  (1.803 m), weight 99 kg, SpO2 98 %. Body mass index is 30.45 kg/m?. ?General: Well developed, well nourished, in no acute distress. ?Head: Normocephalic, atraumatic, sclera non-icteric, no xanthomas, nares are without discharge. ?Neck: No apparent masses ?Lungs: Normal respirations with no wheezes, no rhonchi, no rales , no crackles  ? Heart: Regular rate and rhythm, normal S1 S2, no murmur, no rub, no gallop, PMI is normal size and placement, carotid upstroke normal without bruit, jugular venous pressure normal ?Abdomen: Soft, non-tender, non-distended with normoactive bowel sounds. No hepatosplenomegaly. Abdominal aorta is normal size without bruit ?Extremities: No edema, no clubbing, no cyanosis, no ulcers,  ?Peripheral: 2+ radial, 2+ femoral, 2+ dorsal pedal pulses ?Neuro: Alert and oriented. Moves all extremities spontaneously. ?Psych:  Responds to questions appropriately with a normal affect. ? ? ?Intake/Output Summary (Last 24 hours) at 12/28/2021 0842 ?Last data filed at 12/28/2021 0500 ?Gross per 24 hour  ?Intake 98.43 ml  ?Output 925 ml  ?Net -826.57 ml  ? ? ?Inpatient Medications:  ? aspirin EC  81 mg Oral Daily  ? carvedilol  3.125 mg  Oral BID WC  ? enoxaparin (LOVENOX) injection  40 mg Subcutaneous Q24H  ? ezetimibe  10 mg Oral Daily  ? insulin aspart  0-15 Units Subcutaneous TID WC  ? insulin aspart  0-5 Units Subcutaneous QHS  ? mirabegron ER  50 mg Oral Daily  ? omega-3 acid ethyl esters  2 g Oral BID WC  ? rosuvastatin  5 mg Oral Daily  ? sodium chloride flush  3 mL Intravenous Q12H  ? spironolactone  25 mg Oral Daily  ? ticagrelor  90 mg Oral BID  ? ?Infusions:  ? sodium chloride    ? ? ?Labs: ?Recent Labs  ?  12/27/21 ?1747 12/28/21 ?0405  ?NA 139 138  ?K 4.7 4.1  ?CL 100 103  ?CO2 23 23  ?GLUCOSE 141* 145*  ?BUN 30* 29*  ?CREATININE 1.06 1.02  ?CALCIUM 9.5 9.3  ? ?Recent Labs  ?  12/27/21 ?1747  ?AST 30  ?ALT 28  ?ALKPHOS 59  ?BILITOT 1.4*  ?PROT 7.8  ?ALBUMIN 4.0  ? ?Recent Labs  ?  12/27/21 ?1747 12/28/21 ?0405  ?WBC 9.4 10.3  ?NEUTROABS 5.5  --   ?HGB 15.6 15.0  ?HCT 47.4 44.8  ?MCV 89.6 90.9  ?PLT 214 182  ? ?No results for input(s): CKTOTAL, CKMB, TROPONINI in the last 72 hours. ?Invalid input(s): POCBNP ?No results for input(s): HGBA1C in the last 72 hours.  ? ?Weights: ?Filed Weights  ? 12/27/21 1807  ?Weight: 99 kg  ? ? ? ?Radiology/Studies:  ?CARDIAC CATHETERIZATION ? ?Result Date: 12/27/2021 ?Conclusions: Severe single vessel coronary artery disease with acute plaque rupture and thrombus formation in the mid LAD with 95% stenosis.  There is mild-moderate, non-obstructive disease noted in the distal RCA/rPDA as well  as the left coronary artery. Mildly reduced left ventricular contraction (LVEF 45-50%) with inferior hypokineses and mildly elevated filling pressure (LVEDP 20 mmHg). Successful primary PCI to mid RCA using Onyx Frontier 5.0 x 22 mm drug-eluting stent with 0% residual stenosis and TIMI-3 flow.  Embolization into the distal portions of rPDA and rPL3 was noted; these branches are too small for PCI. Recommendations: Dual antiplatelet therapy with aspirin and ticagrelor for at least 12 months. Continue tirofiban infusion  for 4 hours. Aggressive secondary prevention.  I will rechallenge Mr. Lopez with rosuvastatin 5 mg daily and continue ezetimibe 10 mg daily, given history of statin intolerance.  Aggressive triglyceride-lowering therapy will need to be pursued. Obtain echocardiogram. Escalate GDMT for cardiomyopathy as tolerated. Yvonne Kendall, MD [facility] ? ?DG Chest Portable 1 View ? ?Result Date: 12/27/2021 ?CLINICAL DATA:  Chest pain. EXAM: PORTABLE CHEST 1 VIEW COMPARISON:  Chest two views 08/02/2020 FINDINGS: Single frontal view of the chest. Overlying possible cardiac pacer or defibrillator pad over the superomedial left hemithorax. Cardiac silhouette is mildly enlarged. Mediastinal contours are within normal limits. Possible minimal interstitial thickening compared to prior. No definite pleural effusion. No pneumothorax. Moderate multilevel degenerative disc changes of the thoracic spine. IMPRESSION: Possible mild interstitial pulmonary edema. Electronically Signed   By: Neita Garnet M.D.   On: 12/27/2021 18:07   ? ? Assessment and Recommendation  ?76 y.o. male with known apparent dilated cardiomyopathy hypertension hyperlipidemia diabetes admitted for an acute STEMI of the right coronary artery with 95% occlusion.  Patient underwent PCI and stent placement without complication and has done well. ?1.  Echocardiogram for LV systolic dysfunction valvular heart disease contributing to above ?2.  Continue dual antiplatelet therapy ?3.  High intensity cholesterol therapy ?4.  Begin ambulation ?5.  Okay for transfer to telemetry ? ?Signed, ?Arnoldo Hooker M.D. FACC ? ?

## 2021-12-28 NOTE — Assessment & Plan Note (Signed)
Patient complaining of worsening his chronic bilateral shoulder pain following his procedure ?No improvement with Tylenol.  Tramadol ordered. ? ?

## 2021-12-28 NOTE — Progress Notes (Signed)
Spoke to Dr Ardell Isaacs this morning,ok to give coreg early  ?

## 2021-12-29 ENCOUNTER — Other Ambulatory Visit (HOSPITAL_COMMUNITY): Payer: Self-pay

## 2021-12-29 ENCOUNTER — Encounter: Payer: Self-pay | Admitting: Internal Medicine

## 2021-12-29 DIAGNOSIS — I2111 ST elevation (STEMI) myocardial infarction involving right coronary artery: Secondary | ICD-10-CM | POA: Diagnosis not present

## 2021-12-29 LAB — GLUCOSE, CAPILLARY
Glucose-Capillary: 198 mg/dL — ABNORMAL HIGH (ref 70–99)
Glucose-Capillary: 157 mg/dL — ABNORMAL HIGH (ref 70–99)

## 2021-12-29 LAB — CBC
HCT: 43.9 % (ref 39.0–52.0)
Hemoglobin: 14.4 g/dL (ref 13.0–17.0)
MCH: 29.4 pg (ref 26.0–34.0)
MCHC: 32.8 g/dL (ref 30.0–36.0)
MCV: 89.6 fL (ref 80.0–100.0)
Platelets: 160 10*3/uL (ref 150–400)
RBC: 4.9 MIL/uL (ref 4.22–5.81)
RDW: 13.2 % (ref 11.5–15.5)
WBC: 9.2 10*3/uL (ref 4.0–10.5)
nRBC: 0 % (ref 0.0–0.2)

## 2021-12-29 LAB — BASIC METABOLIC PANEL
Anion gap: 9 (ref 5–15)
BUN: 24 mg/dL — ABNORMAL HIGH (ref 8–23)
CO2: 25 mmol/L (ref 22–32)
Calcium: 8.8 mg/dL — ABNORMAL LOW (ref 8.9–10.3)
Chloride: 99 mmol/L (ref 98–111)
Creatinine, Ser: 0.97 mg/dL (ref 0.61–1.24)
GFR, Estimated: >60 mL/min (ref >60)
Glucose, Bld: 256 mg/dL — ABNORMAL HIGH (ref 70–99)
Potassium: 3.9 mmol/L (ref 3.5–5.1)
Sodium: 133 mmol/L — ABNORMAL LOW (ref 135–145)

## 2021-12-29 LAB — HEMOGLOBIN A1C
Hgb A1c MFr Bld: 7.3 % — ABNORMAL HIGH (ref 4.8–5.6)
Mean Plasma Glucose: 163 mg/dL

## 2021-12-29 LAB — MAGNESIUM: Magnesium: 2 mg/dL (ref 1.7–2.4)

## 2021-12-29 LAB — PHOSPHORUS: Phosphorus: 2.1 mg/dL — ABNORMAL LOW (ref 2.5–4.6)

## 2021-12-29 MED ORDER — OMEGA-3-ACID ETHYL ESTERS 1 G PO CAPS
2.0000 g | ORAL_CAPSULE | Freq: Two times a day (BID) | ORAL | 0 refills | Status: DC
Start: 2021-12-29 — End: 2024-03-15

## 2021-12-29 MED ORDER — TICAGRELOR 90 MG PO TABS: 90.0000 mg | ORAL_TABLET | Freq: Two times a day (BID) | ORAL | 1 refills | Status: DC

## 2021-12-29 MED ORDER — ROSUVASTATIN CALCIUM 5 MG PO TABS: 5.0000 mg | ORAL_TABLET | Freq: Every day | ORAL | 0 refills | Status: DC

## 2021-12-29 MED ORDER — TESTOSTERONE 50 MG/5GM (1%) TD GEL: 5.0000 g | Freq: Every day | TRANSDERMAL | Status: AC

## 2021-12-29 MED ORDER — CHLORHEXIDINE GLUCONATE CLOTH 2 % EX PADS: 6.0000 | MEDICATED_PAD | Freq: Every day | CUTANEOUS | Status: DC

## 2021-12-29 NOTE — Discharge Summary (Signed)
?Physician Discharge Summary ?  ?Patient: Casey Peters MRN: [id number] DOB: November 18, 1945  ?Admit date:     12/27/2021  ?Discharge date: 12/29/21  ?Discharge Physician: Casey Peters  ? ?PCP: Casey Peters, Casey D, MD  ? ?Recommendations at discharge:  ?Please obtain CBC and BMP in 1 week ?Follow-up with cardiology within a week ?Follow-up with cardiac rehab ? ?Discharge Diagnoses: ?Principal Problem: ?  STEMI involving right coronary artery (HCC) ?Active Problems: ?  S/P cardiac catheterization ?  HFrEF (heart failure with reduced ejection fraction) (HCC) ?  Controlled type 2 diabetes mellitus without complication (HCC) ?  Dilated idiopathic cardiomyopathy (HCC) ?  Hypertriglyceridemia ?  Shoulder pain ? ? ?Hospital Course: ?Casey Peters is a 76 y.o. male with medical history significant of Dilated cardiomyopathy, HTN, hyperlipidemia/hypertriglyceridemia, type 2 diabetes, admitted to the hospitalist service status postcardiac cath after arriving as a code STEMI.  Patient had chest pain that developed 30 minutes prior to arrival.  He was taken to the Cath Lab where he underwent uneventful placement of a stent in the proximal RCA for 95% occlusion.  He was transferred to ICU from Cath Lab on nitroglycerin infusion and Aggrastat.  Remained stable and was able to wean off the infusions.  Lipid panel significant for total cholesterol of 322 and triglyceride of 5095.  Chest x-ray on admission with possible mild interstitial pulmonary edema. ? ?Patient remained stable after cardiac catheterization.  He was able to ambulate without any chest pain or shortness of breath.  He will continue with aspirin and Brilinta and have a close follow-up with cardiology for further recommendations. ? ?Patient will continue with rest of his home medications and follow-up with his providers. ? ?Assessment and Plan: ?* STEMI involving right coronary artery (HCC) ?S/p cath with stent to RCA. ?Has residual post cath pain and is on  nitroglycerin infusion ?Post-cath orders per Casey Peters Deerhristopher Peters ?Continues on carvedilol, rosuvastatin.  As needed labetalol and hydralazine for SBP over 150.  On Aggrastat ? ?S/P cardiac catheterization ?Management as above ? ?HFrEF (heart failure with reduced ejection fraction) (HCC) ?HFrEF with improved EF.  Previously 20%- 30 to 35%- 45%(estimated cardiac cath 12/27/2021) ?Secondary to dilated cardiomyopathy, previously presumed nonischemic per review of cardiology note ?Continue carvedilol, spironolactone ?Chest x-ray showed mild pulmonary vascular congestion, likely secondary to STEMI ?Daily weights and continue to monitor for decompensation ? ?Shoulder pain ?Patient complaining of worsening his chronic bilateral shoulder pain following his procedure ?No improvement with Tylenol.  Tramadol ordered. ? ? ?Hypertriglyceridemia ?Continue rosuvastatin.  Triglyceride level over 1500 ? ?Dilated idiopathic cardiomyopathy (HCC) ?See management above under HFrEF ? ?Controlled type 2 diabetes mellitus without complication (HCC) ?Sliding scale insulin coverage ? ? ?Consultants: Cardiology ?Procedures performed: Cardiac catheterization with stent placement ?Disposition: Home ?Diet recommendation:  ?Discharge Diet Orders (From admission, onward)  ? ?  Start     Ordered  ? 12/29/21 0000  Diet - low sodium heart healthy       ? 12/29/21 1216  ? ?  ?  ? ?  ? ?Cardiac and Carb modified diet ?DISCHARGE MEDICATION: ?Allergies as of 12/29/2021   ? ?   Reactions  ? Sulfamethoxazole-trimethoprim   ? Fatigue and muscle ache  ? ?  ? ?  ?Medication List  ?  ? ?STOP taking these medications   ? ?furosemide 20 MG tablet ?Commonly known as: LASIX ?  ?meloxicam 7.5 MG tablet ?Commonly known as: MOBIC ?  ?mirabegron ER 50 MG Tb24 tablet ?Commonly known as: MYRBETRIQ ?  ?  nitrofurantoin (macrocrystal-monohydrate) 100 MG capsule ?Commonly known as: Macrobid ?  ?simvastatin 10 MG tablet ?Commonly known as: ZOCOR ?  ? ?  ? ?TAKE these medications    ? ?amoxicillin 875 MG tablet ?Commonly known as: AMOXIL ?Take 875 mg by mouth 2 (two) times daily. ?  ?aspirin EC 81 MG tablet ?Take 81 mg by mouth daily. ?  ?carvedilol 3.125 MG tablet ?Commonly known as: COREG ?Take 3.125 mg by mouth 2 (two) times daily. ?  ?cholecalciferol 25 MCG (1000 UNIT) tablet ?Commonly known as: VITAMIN D3 ?Take 1,000 Units by mouth daily. ?  ?ezetimibe 10 MG tablet ?Commonly known as: ZETIA ?Take 10 mg by mouth daily. ?  ?metFORMIN 500 MG tablet ?Commonly known as: GLUCOPHAGE ?Take 1 tablet by mouth 2 (two) times daily with a meal. ?  ?omega-3 acid ethyl esters 1 g capsule ?Commonly known as: LOVAZA ?Take 2 capsules (2 g total) by mouth 2 (two) times daily with a meal. ?  ?ONE TOUCH ULTRA TEST test strip ?Generic drug: glucose blood ?USE 1 STRIP 3 TIMES A DAY TO CHECK BLOOD GLUCOSE LEVELS ?  ?rosuvastatin 5 MG tablet ?Commonly known as: CRESTOR ?Take 1 tablet (5 mg total) by mouth daily. ?Start taking on: December 30, 2021 ?  ?spironolactone 25 MG tablet ?Commonly known as: ALDACTONE ?Take 25 mg by mouth daily. ?  ?testosterone 50 MG/5GM (1%) Gel ?Commonly known as: ANDROGEL ?Place 5 g onto the skin daily. Please confirm with your cardiologist before resuming ?What changed:  ?how to take this ?additional instructions ?  ?ticagrelor 90 MG Tabs tablet ?Commonly known as: BRILINTA ?Take 1 tablet (90 mg total) by mouth 2 (two) times daily. ?  ?vitamin C 500 MG tablet ?Commonly known as: ASCORBIC ACID ?Take 500 mg by mouth daily. ?  ?Zinc Gluconate 30 MG Tabs ?Take 30 mg by mouth daily. ?  ? ?  ? ? Follow-up Information   ? ? Casey Blinks, MD. Schedule an appointment as soon as possible for a visit in 1 week(s).   ?Specialty: Cardiology ?Contact information: ?[address] ?[facility] ?[place] [address] ?[phone] ? ? ?  ?  ? ?  ?  ? ?  ? ?Discharge Exam: ?Casey Peters Weights  ? 12/27/21 1807  ?Weight: 99 kg  ? ?General.     In no acute distress. ?Pulmonary.   Lungs clear bilaterally, normal respiratory effort. ?CV.  Regular rate and rhythm, no JVD, rub or murmur. ?Abdomen.  Soft, nontender, nondistended, BS positive. ?CNS.  Alert and oriented x3.  No focal neurologic deficit. ?Extremities.  No edema, no cyanosis, pulses intact and symmetrical. ?Psychiatry.  Judgment and insight appears normal.  ? ?Condition at discharge: stable ? ?The results of significant diagnostics from this hospitalization (including imaging, microbiology, ancillary and laboratory) are listed below for reference.  ? ?Imaging Studies: ?CARDIAC CATHETERIZATION ? ?Addendum Date: 12/29/2021   ?Conclusions: Severe single vessel coronary artery disease with acute plaque rupture and thrombus formation in the mid RCA with 95% stenosis.  There is mild-moderate, non-obstructive disease noted in the distal RCA/rPDA as well as the left coronary artery. Mildly reduced left ventricular contraction (LVEF 45-50%) with inferior hypokineses and mildly elevated filling pressure (LVEDP 20 mmHg). Successful primary PCI to mid RCA using Onyx Frontier 5.0 x 22 mm drug-eluting stent with 0% residual stenosis and TIMI-3 flow.  Embolization into the distal portions of rPDA and rPL3 was noted; these branches are too small for PCI. Recommendations: Dual antiplatelet therapy with  aspirin and ticagrelor for at least 12 months. Continue tirofiban infusion for 4 hours. Aggressive secondary prevention.  I will rechallenge Mr. Gretzinger with rosuvastatin 5 mg daily and continue ezetimibe 10 mg daily, given history of statin intolerance.  Aggressive triglyceride-lowering therapy will need to be pursued. Obtain echocardiogram. Escalate GDMT for cardiomyopathy as tolerated. Yvonne Kendall, MD [facility]  ? ?Result Date: 12/29/2021 ?Conclusions: Severe single vessel coronary artery disease with acute plaque rupture and thrombus formation in the mid LAD with 95% stenosis.  There is mild-moderate, non-obstructive disease noted in the  distal RCA/rPDA as well as the left coronary artery. Mildly reduced left ventricular contraction (LVEF 45-50%) with inferior hypokineses and mildly elevated filling pressure (LVEDP 20 mmHg). Successful prim

## 2021-12-29 NOTE — TOC Benefit Eligibility Note (Signed)
Patient Advocate Encounter  Insurance verification completed.    The patient is currently admitted and upon discharge could be taking Brilinta 90 mg.  The current 30 day co-pay is, $40.00.   The patient is insured through [employer] Part D     [name], CPhT Pharmacy Patient Advocate Specialist [facility] Patient Advocate Team Direct Number: [phone]  Fax: [phone]        

## 2021-12-29 NOTE — Progress Notes (Signed)
Pt discharged home, all questions answered and addressed. All IVS removed and belongings sent with patient and his wife. ?

## 2021-12-29 NOTE — Progress Notes (Signed)
Patient had an uneventful shift and slept all night. Some prolonged bleeding after a finger stick for glucose check possible related to blood thinners.  ?

## 2021-12-29 NOTE — Progress Notes (Signed)
[facility] Cardiology [facility] Encounter Note ? ?Patient: Casey Peters / Admit Date: 12/27/2021 / Date of Encounter: 12/29/2021, 7:18 AM ? ? ?Subjective: ?Overall patient has felt well overnight with no evidence of episodes of chest discomfort.  Patient is breathing much better and hemodynamically stable.  No complications from PCI and stent placement or as ST elevation myocardial infarction of the inferior wall ? ?Review of Systems: ?Positive for: None ?Negative for: Vision change, hearing change, syncope, dizziness, nausea, vomiting,diarrhea, bloody stool, stomach pain, cough, congestion, diaphoresis, urinary frequency, urinary pain,skin lesions, skin rashes ?Others previously listed ? ?Objective: ?Telemetry: Normal sinus rhythm ?Physical Exam: Blood pressure 109/65, pulse 90, temperature 98.7 ?F (37.1 ?C), temperature source Oral, resp. rate 19, height 5\' 11"  (1.803 m), weight 99 kg, SpO2 97 %. Body mass index is 30.45 kg/m?. ?General: Well developed, well nourished, in no acute distress. ?Head: Normocephalic, atraumatic, sclera non-icteric, no xanthomas, nares are without discharge. ?Neck: No apparent masses ?Lungs: Normal respirations with no wheezes, no rhonchi, no rales , no crackles  ? Heart: Regular rate and rhythm, normal S1 S2, no murmur, no rub, no gallop, PMI is normal size and placement, carotid upstroke normal without bruit, jugular venous pressure normal ?Abdomen: Soft, non-tender, non-distended with normoactive bowel sounds. No hepatosplenomegaly. Abdominal aorta is normal size without bruit ?Extremities: No edema, no clubbing, no cyanosis, no ulcers,  ?Peripheral: 2+ radial, 2+ femoral, 2+ dorsal pedal pulses ?Neuro: Alert and oriented. Moves all extremities spontaneously. ?Psych:  Responds to questions appropriately with a normal affect. ? ? ?Intake/Output Summary (Last 24 hours) at 12/29/2021 0718 ?Last data filed at 12/29/2021 0700 ?Gross per 24 hour  ?Intake 120 ml  ?Output 1300 ml   ?Net -1180 ml  ? ? ?Inpatient Medications:  ? aspirin EC  81 mg Oral Daily  ? carvedilol  3.125 mg Oral BID WC  ? Chlorhexidine Gluconate Cloth  6 each Topical Daily  ? enoxaparin (LOVENOX) injection  40 mg Subcutaneous Q24H  ? ezetimibe  10 mg Oral Daily  ? insulin aspart  0-15 Units Subcutaneous TID WC  ? insulin aspart  0-5 Units Subcutaneous QHS  ? mirabegron ER  50 mg Oral Daily  ? omega-3 acid ethyl esters  2 g Oral BID WC  ? rosuvastatin  5 mg Oral Daily  ? sodium chloride flush  3 mL Intravenous Q12H  ? spironolactone  25 mg Oral Daily  ? ticagrelor  90 mg Oral BID  ? ?Infusions:  ? sodium chloride    ? ? ?Labs: ?Recent Labs  ?  12/28/21 ?0405 12/29/21 ?12/31/21  ?NA 138 133*  ?K 4.1 3.9  ?CL 103 99  ?CO2 23 25  ?GLUCOSE 145* 256*  ?BUN 29* 24*  ?CREATININE 1.02 0.97  ?CALCIUM 9.3 8.8*  ?MG  --  2.0  ?PHOS  --  2.1*  ? ?Recent Labs  ?  12/27/21 ?1747  ?AST 30  ?ALT 28  ?ALKPHOS 59  ?BILITOT 1.4*  ?PROT 7.8  ?ALBUMIN 4.0  ? ?Recent Labs  ?  12/27/21 ?1747 12/28/21 ?0405 12/29/21 ?12/31/21  ?WBC 9.4 10.3 9.2  ?NEUTROABS 5.5  --   --   ?HGB 15.6 15.0 14.4  ?HCT 47.4 44.8 43.9  ?MCV 89.6 90.9 89.6  ?PLT 214 182 160  ? ?No results for input(s): CKTOTAL, CKMB, TROPONINI in the last 72 hours. ?Invalid input(s): POCBNP ?No results for input(s): HGBA1C in the last 72 hours.  ? ?Weights: ?Filed Weights  ? 12/27/21 1807  ?Weight: 99  kg  ? ? ? ?Radiology/Studies:  ?CARDIAC CATHETERIZATION ? ?Addendum Date: 12/29/2021   ?Conclusions: Severe single vessel coronary artery disease with acute plaque rupture and thrombus formation in the mid RCA with 95% stenosis.  There is mild-moderate, non-obstructive disease noted in the distal RCA/rPDA as well as the left coronary artery. Mildly reduced left ventricular contraction (LVEF 45-50%) with inferior hypokineses and mildly elevated filling pressure (LVEDP 20 mmHg). Successful primary PCI to mid RCA using Onyx Frontier 5.0 x 22 mm drug-eluting stent with 0% residual stenosis and TIMI-3  flow.  Embolization into the distal portions of rPDA and rPL3 was noted; these branches are too small for PCI. Recommendations: Dual antiplatelet therapy with aspirin and ticagrelor for at least 12 months. Continue tirofiban infusion for 4 hours. Aggressive secondary prevention.  I will rechallenge Mr. Kucharski with rosuvastatin 5 mg daily and continue ezetimibe 10 mg daily, given history of statin intolerance.  Aggressive triglyceride-lowering therapy will need to be pursued. Obtain echocardiogram. Escalate GDMT for cardiomyopathy as tolerated. Yvonne Kendallhristopher End, MD [facility]  ? ?Result Date: 12/29/2021 ?Conclusions: Severe single vessel coronary artery disease with acute plaque rupture and thrombus formation in the mid LAD with 95% stenosis.  There is mild-moderate, non-obstructive disease noted in the distal RCA/rPDA as well as the left coronary artery. Mildly reduced left ventricular contraction (LVEF 45-50%) with inferior hypokineses and mildly elevated filling pressure (LVEDP 20 mmHg). Successful primary PCI to mid RCA using Onyx Frontier 5.0 x 22 mm drug-eluting stent with 0% residual stenosis and TIMI-3 flow.  Embolization into the distal portions of rPDA and rPL3 was noted; these branches are too small for PCI. Recommendations: Dual antiplatelet therapy with aspirin and ticagrelor for at least 12 months. Continue tirofiban infusion for 4 hours. Aggressive secondary prevention.  I will rechallenge Mr. Mestas with rosuvastatin 5 mg daily and continue ezetimibe 10 mg daily, given history of statin intolerance.  Aggressive triglyceride-lowering therapy will need to be pursued. Obtain echocardiogram. Escalate GDMT for cardiomyopathy as tolerated. Yvonne Kendallhristopher End, MD [facility] ? ?DG Chest Portable 1 View ? ?Result Date: 12/27/2021 ?CLINICAL DATA:  Chest pain. EXAM: PORTABLE CHEST 1 VIEW COMPARISON:  Chest two views 08/02/2020 FINDINGS: Single frontal view of the chest. Overlying possible cardiac  pacer or defibrillator pad over the superomedial left hemithorax. Cardiac silhouette is mildly enlarged. Mediastinal contours are within normal limits. Possible minimal interstitial thickening compared to prior. No definite pleural effusion. No pneumothorax. Moderate multilevel degenerative disc changes of the thoracic spine. IMPRESSION: Possible mild interstitial pulmonary edema. Electronically Signed   By: Neita Garnetonald  Viola M.D.   On: 12/27/2021 18:07  ? ?ECHOCARDIOGRAM COMPLETE ? ?Result Date: 12/28/2021 ?   ECHOCARDIOGRAM REPORT   Patient Name:   Casey CorningSAMUEL A Peters Date of Exam: 12/28/2021 Medical Rec #:  [id number]            Height:       71.0 in Accession #:    [id number]           Weight:       218.3 lb Date of Birth:  05/09/1946            BSA:          2.188 m? Patient Age:    76 years             BP:           148/71 mmHg Patient Gender: M  HR:           88 bpm. Exam Location:  [facility] Procedure: 2D Echo Indications:     AMI I21.9  History:         Patient has no prior history of Echocardiogram examinations.  Sonographer:     Overton Mam RDCS Referring Phys:  [id number] Andris Baumann Diagnosing Phys: Arnoldo Hooker MD  Sonographer Comments: No subcostal window. Image acquisition challenging due to patient body habitus. IMPRESSIONS  1. Left ventricular ejection fraction, by estimation, is 45 to 50%. The left ventricle has mildly decreased function. The left ventricle demonstrates regional wall motion abnormalities (see scoring diagram/findings for description). Left ventricular diastolic parameters were normal.  2. Right ventricular systolic function is normal. The right ventricular size is normal.  3. The mitral valve is normal in structure. Mild mitral valve regurgitation.  4. The aortic valve is normal in structure. Aortic valve regurgitation is not visualized. FINDINGS  Left Ventricle: Left ventricular ejection fraction, by estimation, is 45 to 50%. The left ventricle has mildly decreased  function. The left ventricle demonstrates regional wall motion abnormalities. Moderate hypokinesis of the left ventricular, mid inferior wall and inferoseptal wall. The left ventricular internal cavity size w

## 2021-12-29 NOTE — Plan of Care (Signed)

## 2022-02-10 IMAGING — CR DG CHEST 2V
2 series · 2 of 2 positions shown · non-contrast
Comparison: None.

CLINICAL DATA: Shortness of breath, fevers.

EXAM:
CHEST - 2 VIEW

[chest pa]
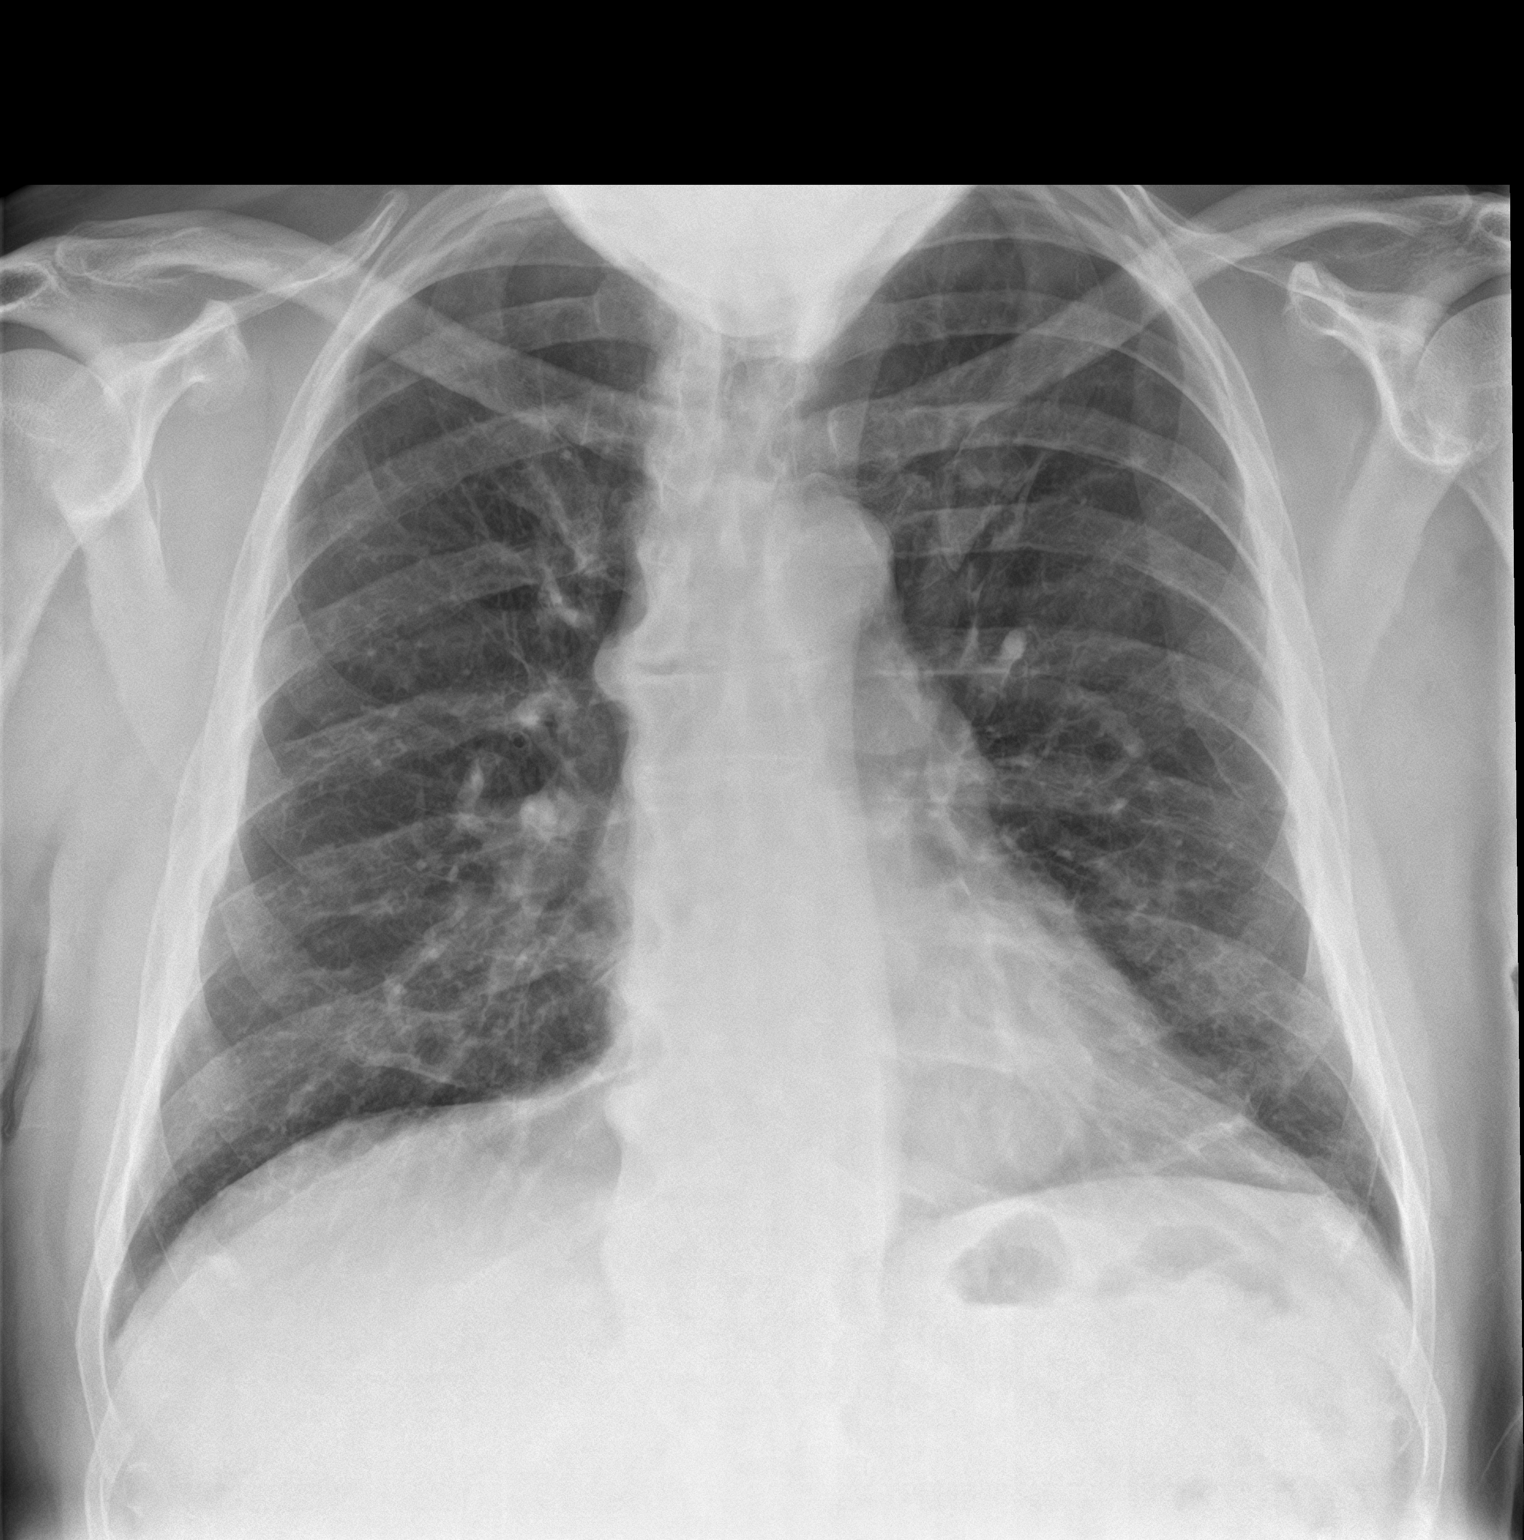

[chest lat]
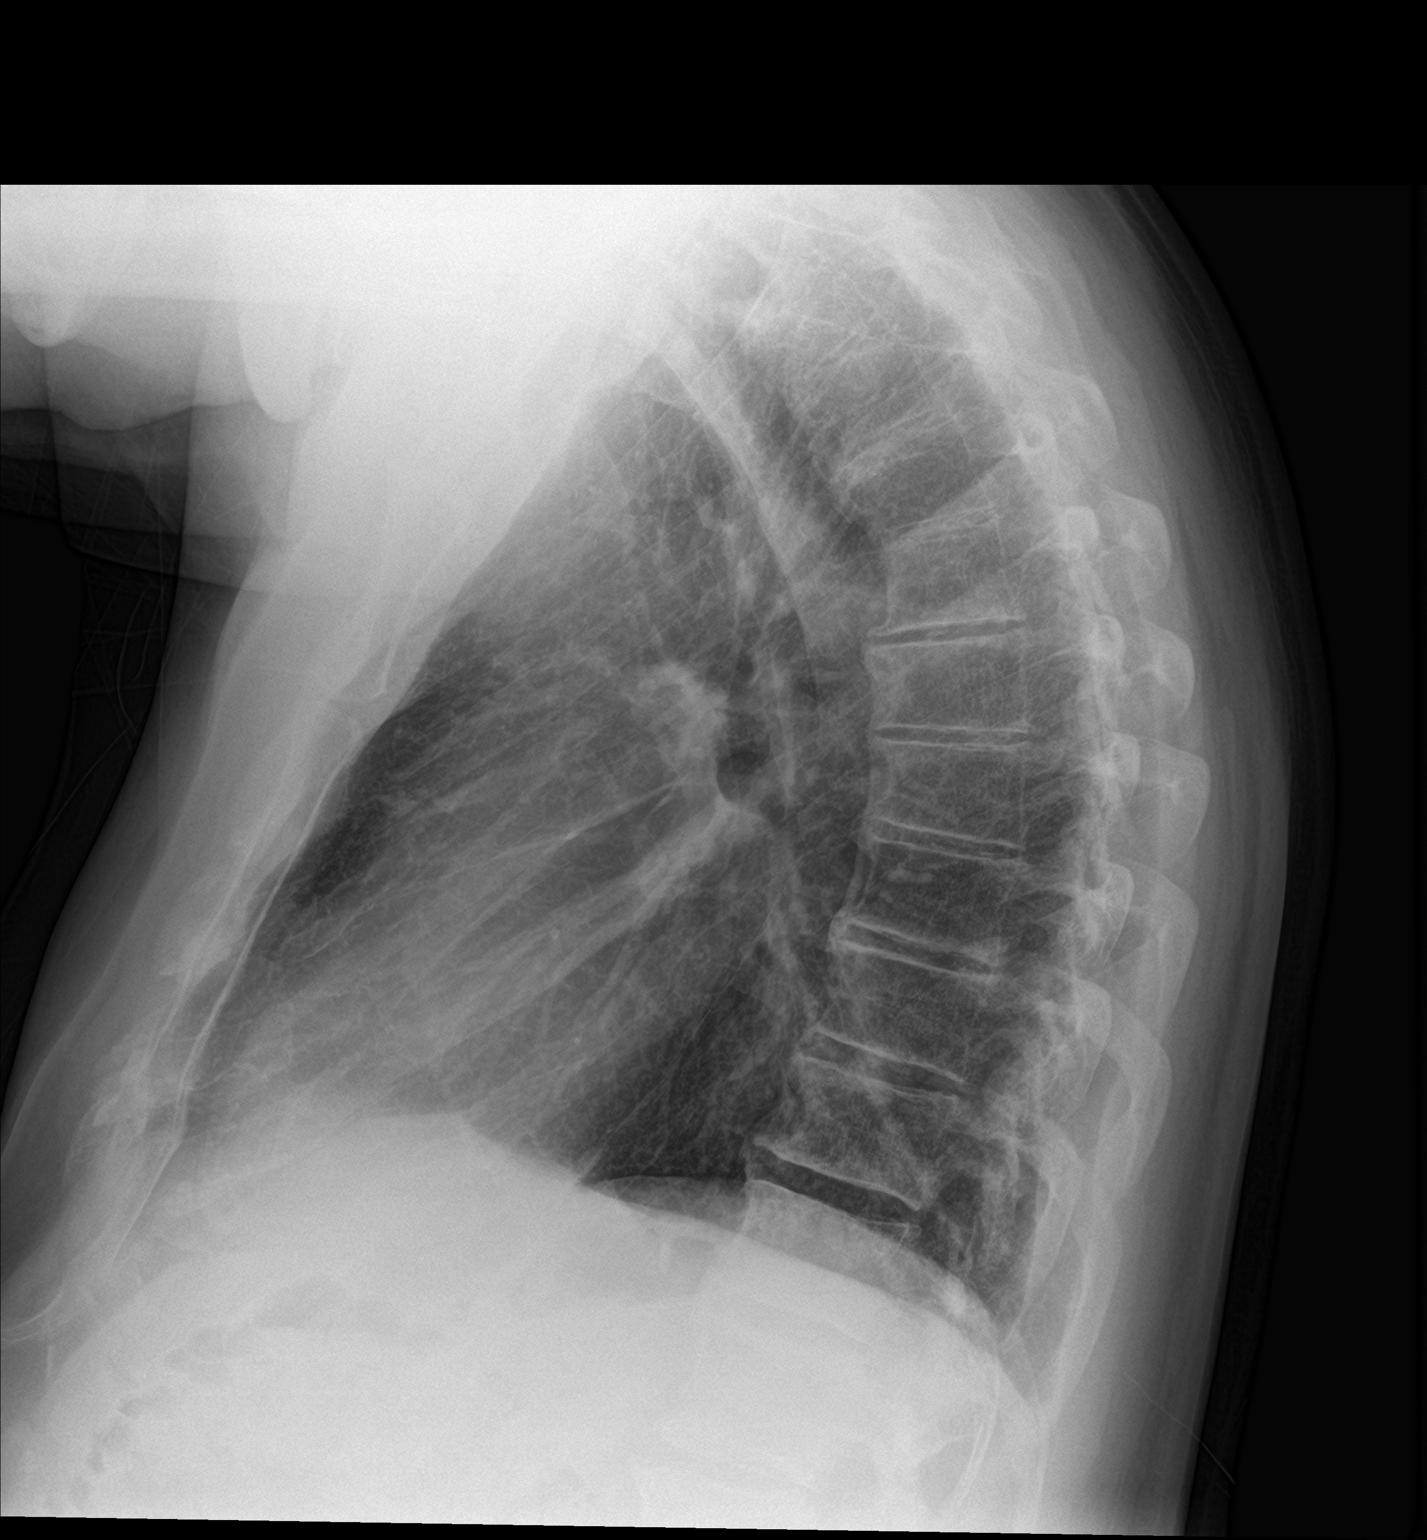

[2 of 2 positions shown; findings below may reference images not displayed]

FINDINGS: The heart size and mediastinal contours are within normal limits.
Both lungs are clear. No pneumothorax or pleural effusion is noted.
The visualized skeletal structures are unremarkable.
IMPRESSION: No active cardiopulmonary disease.

## 2022-02-16 ENCOUNTER — Ambulatory Visit
Admission: EM | Admit: 2022-02-16 | Discharge: 2022-02-16 | Discharge status: Against Medical Advice | Payer: Medicare PPO

## 2022-02-16 DIAGNOSIS — Z5320 Procedure and treatment not carried out because of patient's decision for unspecified reasons: Secondary | ICD-10-CM

## 2022-02-16 DIAGNOSIS — R319 Hematuria, unspecified: Secondary | ICD-10-CM

## 2022-02-16 DIAGNOSIS — N390 Urinary tract infection, site not specified: Secondary | ICD-10-CM

## 2022-02-16 NOTE — ED Provider Notes (Signed)
Patient decided to follow-up with PCP and left without being seen ?  ?Scot Jun, FNP ?02/16/22 1511 ? ?

## 2022-04-10 ENCOUNTER — Ambulatory Visit
Admission: EM | Admit: 2022-04-10 | Discharge: 2022-04-10 | Disposition: A | Discharge status: Home / Self Care | Payer: Medicare PPO | Attending: Emergency Medicine | Admitting: Emergency Medicine

## 2022-04-10 ENCOUNTER — Encounter: Payer: Self-pay | Admitting: Emergency Medicine

## 2022-04-10 DIAGNOSIS — R350 Frequency of micturition: Secondary | ICD-10-CM | POA: Diagnosis not present

## 2022-04-10 DIAGNOSIS — R300 Dysuria: Secondary | ICD-10-CM | POA: Diagnosis present

## 2022-04-10 DIAGNOSIS — E119 Type 2 diabetes mellitus without complications: Secondary | ICD-10-CM | POA: Diagnosis present

## 2022-04-10 DIAGNOSIS — E1165 Type 2 diabetes mellitus with hyperglycemia: Secondary | ICD-10-CM | POA: Diagnosis not present

## 2022-04-10 LAB — POCT FASTING CBG KUC MANUAL ENTRY: POCT Glucose (KUC): 207 mg/dL — AB (ref 70–99)

## 2022-04-10 LAB — POCT URINALYSIS DIP (MANUAL ENTRY)
Bilirubin, UA: NEGATIVE
Glucose, UA: >=1000 mg/dL — AB
Ketones, POC UA: NEGATIVE mg/dL
Nitrite, UA: NEGATIVE
Protein Ur, POC: 100 mg/dL — AB
Spec Grav, UA: 1.025 (ref 1.010–1.025)
Urobilinogen, UA: 0.2 E.U./dL
pH, UA: 5.5 (ref 5.0–8.0)

## 2022-04-10 MED ORDER — CEPHALEXIN 500 MG PO CAPS
500.0000 mg | ORAL_CAPSULE | Freq: Two times a day (BID) | ORAL | 0 refills | Status: AC
Start: 2022-04-10 — End: 2022-04-15

## 2022-04-10 NOTE — Discharge Instructions (Addendum)
Take the antibiotic as directed.  The urine culture is pending.  We will call you if it shows the need to change or discontinue your antibiotic.    Follow up with your primary care provider or urologist next week.

## 2022-04-10 NOTE — ED Provider Notes (Signed)
[facility]    CSN: [id number] Arrival date & time: 04/10/22  1031      History   Chief Complaint Chief Complaint  Patient presents with   Dysuria   Back Pain    HPI Casey Peters is a 76 y.o. male.  Patient presents with 5-day history of dysuria, urinary frequency, low back pain.  He denies fever, chills, abdominal pain, hematuria, penile discharge, testicular pain, or other symptoms.  He reports his urine stream is at his baseline.  No treatments at home.  Patient was seen by his PCP on 02/17/2022; had a UA; started on Farxiga.  He was seen by [facility] urologist on 02/23/2022; diagnosed with stricture of male urethra.  His medical history includes diabetes, STEMI, hypertension, chronic heart failure.  The history is provided by the patient and medical records.    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   S/P cardiac catheterization 12/27/2021   Dilated idiopathic cardiomyopathy (HCC)    Hypertriglyceridemia    HFrEF (heart failure with reduced ejection fraction) (HCC)    Palpitations 02/08/2020   Low testosterone 10/26/2019   Pain of finger 06/29/2019   Chronic systolic heart failure (HCC) 10/25/2018   Tobacco use 10/25/2018   Rising PSA level 07/10/2015   Microscopic hematuria 07/10/2015   Type 2 diabetes mellitus (HCC) 06/08/2014   HLD (hyperlipidemia) 06/08/2014   BP (high blood pressure) 06/08/2014   Obesity, diabetes, and hypertension syndrome  (HCC) 06/08/2014   Adiposity 06/08/2014   Controlled type 2 diabetes mellitus without complication (HCC) 06/08/2014    Past Surgical History:  Procedure Laterality Date   CORONARY/GRAFT ACUTE MI REVASCULARIZATION N/A 12/27/2021   Procedure: Coronary/Graft Acute MI Revascularization;  Surgeon: Yvonne Kendall, MD;  Location: [facility] INVASIVE CV LAB;  Service: Cardiovascular;  Laterality: N/A;   CYSTOSCOPY WITH URETHRAL DILATATION N/A 06/17/2021   Procedure: CYSTOSCOPY WITH URETHRAL DILATATION;  Surgeon: [name], MD;  Location: [facility] ORS;  Service: Urology;  Laterality: N/A;  [employer]ume Balloon   HERNIA REPAIR  1998   LEFT HEART CATH AND CORONARY ANGIOGRAPHY N/A 12/27/2021   Procedure: LEFT HEART CATH AND CORONARY ANGIOGRAPHY;  Surgeon: Yvonne Kendall, MD;  Location: [facility] INVASIVE CV LAB;  Service: Cardiovascular;  Laterality: N/A;   PILONIDAL CYST EXCISION     TONSILLECTOMY     URETHRA SURGERY  1995       Home Medications    Prior to Admission medications   Medication Sig Start Date End Date Taking? Authorizing Provider  cephALEXin (KEFLEX) 500 MG capsule Take 1 capsule (500 mg total) by mouth 2 (two) times daily for 5 days. 04/10/22 04/15/22 Yes Mickie Bail, NP  aspirin EC 81 MG tablet Take 81 mg by mouth daily.    [provider]  carvedilol (COREG) 3.125 MG tablet Take 3.125 mg by mouth 2 (two) times daily. 05/19/18   [provider]  cholecalciferol (VITAMIN D3)  25 MCG (1000 UNIT) tablet Take 1,000 Units by mouth daily.    [provider]  ezetimibe (ZETIA) 10 MG tablet Take 10 mg by mouth daily. 12/24/21   [provider]  FARXIGA 5 MG TABS tablet Take 5 mg by mouth daily. 03/13/22   [provider]  furosemide (LASIX) 20 MG tablet Take 20 mg by mouth 2 (two) times daily. 01/12/22   [provider]  metFORMIN (GLUCOPHAGE) 500 MG tablet Take 1 tablet by mouth 2 (two) times daily with a meal. 10/14/21   [provider]   omega-3 acid ethyl esters (LOVAZA) 1 g capsule Take 2 capsules (2 g total) by mouth 2 (two) times daily with a meal. 12/29/21   Lorella Nimrod, MD  ONE TOUCH ULTRA TEST test strip USE 1 STRIP 3 TIMES A DAY TO CHECK BLOOD GLUCOSE LEVELS 09/24/18   [provider]  rosuvastatin (CRESTOR) 5 MG tablet Take 1 tablet (5 mg total) by mouth daily. 12/30/21   Lorella Nimrod, MD  spironolactone (ALDACTONE) 25 MG tablet Take 25 mg by mouth daily. 02/21/18 12/28/21  [provider]  testosterone (ANDROGEL) 50 MG/5GM (1%) GEL Place 5 g onto the skin daily. Please confirm with your cardiologist before resuming 12/29/21   Lorella Nimrod, MD  ticagrelor (BRILINTA) 90 MG TABS tablet Take 1 tablet (90 mg total) by mouth 2 (two) times daily. 12/29/21   Lorella Nimrod, MD  vitamin C (ASCORBIC ACID) 500 MG tablet Take 500 mg by mouth daily.    [provider]  Zinc Gluconate 30 MG TABS Take 30 mg by mouth daily.    [provider]    Family History Family History  Problem Relation Age of Onset   Hematuria Father    Kidney disease Neg Hx    Prostate cancer Neg Hx     Social History Social History   Tobacco Use   Smoking status: Every Day    Types: Cigars   Smokeless tobacco: Never   Tobacco comments:    2-3 cigars per day  Vaping Use   Vaping Use: Never used  Substance Use Topics   Alcohol use: No    Alcohol/week: 0.0 standard drinks of alcohol   Drug use: No     Allergies   Sulfamethoxazole-trimethoprim   Review of Systems Review of Systems  Constitutional:  Negative for chills and fever.  Gastrointestinal:  Negative for abdominal pain, diarrhea, nausea and vomiting.  Genitourinary:  Positive for dysuria and frequency. Negative for flank pain, hematuria, penile discharge and testicular pain.  Musculoskeletal:  Positive for back pain. Negative for arthralgias.  All other systems reviewed and are negative.    Physical Exam Triage Vital Signs ED Triage Vitals  [04/10/22 1053]  Enc Vitals Group     BP      Pulse      Resp      Temp      Temp src      SpO2      Weight      Height      Head Circumference      Peak Flow      Pain Score 6     Pain Loc      Pain Edu?      Excl. in [place]?    No data found.  Updated Vital Signs BP 106/69   Pulse 84   Temp 98 F (36.7 C)   Resp 20   SpO2 98%   Visual Acuity  Right Eye Distance:   Left Eye Distance:   Bilateral Distance:    Right Eye Near:   Left Eye Near:    Bilateral Near:     Physical Exam Vitals and nursing note reviewed.  Constitutional:      General: He is not in acute distress.    Appearance: Normal appearance. He is well-developed. He is not ill-appearing.  HENT:     Mouth/Throat:     Mouth: Mucous membranes are moist.  Cardiovascular:     Rate and Rhythm: Normal rate and regular rhythm.     Heart sounds: Normal heart sounds.  Pulmonary:     Effort: Pulmonary effort is normal. No respiratory distress.     Breath sounds: Normal breath sounds.  Abdominal:     General: Bowel sounds are normal.     Palpations: Abdomen is soft.     Tenderness: There is no abdominal tenderness. There is no right CVA tenderness, left CVA tenderness, guarding or rebound.  Musculoskeletal:     Cervical back: Neck supple.  Skin:    General: Skin is warm and dry.  Neurological:     Mental Status: He is alert.      UC Treatments / Results  Labs (all labs ordered are listed, but only abnormal results are displayed) Labs Reviewed  POCT URINALYSIS DIP (MANUAL ENTRY) - Abnormal; Notable for the following components:      Result Value   Clarity, UA cloudy (*)    Glucose, UA >=1,000 (*)    Blood, UA small (*)    Protein Ur, POC =100 (*)    Leukocytes, UA Small (1+) (*)    All other components within normal limits  POCT FASTING CBG [facility] MANUAL ENTRY - Abnormal; Notable for the following components:   POCT Glucose ([facility]) 207 (*)    All other components within normal limits  URINE CULTURE     EKG   Radiology No results found.  Procedures Procedures (including critical care time)  Medications Ordered in UC Medications - No data to display  Initial Impression / Assessment and Plan / UC Course  I have reviewed the triage vital signs and the nursing notes.  Pertinent labs & imaging results that were available during my care of the patient were reviewed by me and considered in my medical decision making (see chart for details).    Dysuria, urinary frequency, type 2 diabetes.  Patient was started on Farxiga by his PCP on 02/17/2022.  He was seen by urology on 02/23/2022 for urethral stricture.  Treating with Keflex. Urine culture pending. Discussed with patient that we will call him if the urine culture shows the need to change or discontinue the antibiotic. Instructed him to follow-up with his PCP or urologist next week. Patient agrees to plan of care.       Final Clinical Impressions(s) / UC Diagnoses   Final diagnoses:  Dysuria  Urinary frequency  Type 2 diabetes mellitus without complication, without long-term current use of insulin (HCC)     Discharge Instructions      Take the antibiotic as directed.  The urine culture is pending.  We will call you if it shows the need to change or discontinue your antibiotic.    Follow up with your primary care provider or urologist next week.        ED Prescriptions     Medication Sig Dispense Auth. Provider   cephALEXin (KEFLEX) 500 MG capsule Take 1 capsule (500 mg total) by mouth  2 (two) times daily for 5 days. 10 capsule Sharion Balloon, NP      PDMP not reviewed this encounter.   Sharion Balloon, NP 04/10/22 1133

## 2022-04-10 NOTE — ED Triage Notes (Signed)
Pt here with bilateral lumbar back pain, urinary burning and frequency x 5 days.

## 2022-04-12 LAB — URINE CULTURE: Culture: >=100000 — AB

## 2022-04-21 ENCOUNTER — Ambulatory Visit: Payer: Medicare PPO | Admitting: Physician Assistant

## 2022-04-21 VITALS — BP 107/70 | HR 81 | Ht 71.0 in | Wt 203.0 lb

## 2022-04-21 DIAGNOSIS — R350 Frequency of micturition: Secondary | ICD-10-CM | POA: Diagnosis not present

## 2022-04-21 LAB — MICROSCOPIC EXAMINATION
RBC, Urine: NONE SEEN /hpf (ref 0–2)
WBC, UA: >30 /hpf — ABNORMAL HIGH (ref 0–5)

## 2022-04-21 LAB — URINALYSIS, COMPLETE
Bilirubin, UA: NEGATIVE
Ketones, UA: NEGATIVE
Nitrite, UA: NEGATIVE
Protein,UA: NEGATIVE
Specific Gravity, UA: 1.025 (ref 1.005–1.030)
Urobilinogen, Ur: 0.2 mg/dL (ref 0.2–1.0)
pH, UA: 5 (ref 5.0–7.5)

## 2022-04-21 LAB — BLADDER SCAN AMB NON-IMAGING

## 2022-04-21 MED ORDER — CIPROFLOXACIN HCL 250 MG PO TABS: 250.0000 mg | ORAL_TABLET | Freq: Two times a day (BID) | ORAL | 0 refills | Status: AC

## 2022-04-21 NOTE — Progress Notes (Signed)
04/21/2022 4:46 PM   Casey Peters 06-27-1946 [id number]  CC: Chief Complaint  Patient presents with   Urinary Incontinence   HPI: Casey Peters is a 76 y.o. 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   He was seen by [facility] urgent [facility] [place] on 04/10/2022 with reports of dysuria, frequency, and low back pain.  He was treated for acute cystitis with Keflex 500 mg twice daily x5 days.  Urine culture finalized with ampicillin resistant and Macrobid intermediate Klebsiella pneumoniae.  Today he reports his symptoms improved on Keflex but returned approximately 4 days after completion.  He denies dysuria today and is just experiencing frequency, urgency, and lower abdominal pain.  He denies fever, chills, nausea, or vomiting.  He is frustrated with his frequency of UTI and would like to try suppressive antibiotics.  He was evaluated by Dr. Guy Sandifer at [facility] regarding his recurrent stricture in May and ultimately decided to defer intervention in favor of CIC.  He states Dr. Guy Sandifer told him he will be unable to proceed with surgical intervention until 1 year after his MI in March 2023.  In-office UA today positive for 3+ glucose, trace intact blood, and 1+ leukocyte esterase; urine microscopy with >30 WBCs/HPF and many bacteria. PVR 45mL.  PMH: Past Medical History:  Diagnosis Date   Cardiomegaly    CHF (congestive heart failure) (HCC)    Constipation    Diabetic polyneuropathy (HCC)    Dilated idiopathic cardiomyopathy (HCC)    Erectile dysfunction    HLD (hyperlipidemia)    HTN (hypertension)    Low testosterone    Palpitations    Pneumonia    T2DM (type 2 diabetes mellitus) (HCC)     Surgical History: Past Surgical History:  Procedure Laterality Date   CORONARY/GRAFT ACUTE MI REVASCULARIZATION N/A 12/27/2021   Procedure:  Coronary/Graft Acute MI Revascularization;  Surgeon: Yvonne Kendall, MD;  Location: [facility] INVASIVE CV LAB;  Service: Cardiovascular;  Laterality: N/A;   CYSTOSCOPY WITH URETHRAL DILATATION N/A 06/17/2021   Procedure: CYSTOSCOPY WITH URETHRAL DILATATION;  Surgeon: [name], MD;  Location: [facility] ORS;  Service: Urology;  Laterality: N/A;  Optilume Balloon   HERNIA REPAIR  1998   LEFT HEART CATH AND CORONARY ANGIOGRAPHY N/A 12/27/2021   Procedure: LEFT HEART CATH AND CORONARY ANGIOGRAPHY;  Surgeon: Yvonne Kendall, MD;  Location: [facility] INVASIVE CV LAB;  Service: Cardiovascular;  Laterality: N/A;   PILONIDAL CYST EXCISION     TONSILLECTOMY     URETHRA SURGERY  1995    Home Medications:  Allergies as of 04/21/2022       Reactions   Sulfamethoxazole-trimethoprim    Fatigue and muscle ache        Medication List        Accurate as of April 21, 2022  4:46 PM. If you have any questions, ask your nurse or doctor.          aspirin EC 81 MG tablet Take 81 mg by mouth daily.   carvedilol 3.125 MG tablet Commonly known as: COREG Take 3.125 mg by mouth 2 (two) times daily.   cholecalciferol 25 MCG (1000 UNIT) tablet Commonly known as: VITAMIN D3 Take 1,000 Units by mouth daily.   ciprofloxacin 250 MG tablet Commonly known as: CIPRO Take 1 tablet (250 mg total) by mouth 2 (two) times daily for 7 days. Started by: Carman Ching,  PA-C   ezetimibe 10 MG tablet Commonly known as: ZETIA Take 10 mg by mouth daily.   Farxiga 5 MG Tabs tablet Generic drug: dapagliflozin propanediol Take 5 mg by mouth daily.   furosemide 20 MG tablet Commonly known as: LASIX Take 20 mg by mouth 2 (two) times daily.   metFORMIN 500 MG tablet Commonly known as: GLUCOPHAGE Take 1 tablet by mouth 2 (two) times daily with a meal.   omega-3 acid ethyl esters 1 g capsule Commonly known as: LOVAZA Take 2 capsules (2 g total) by mouth 2 (two) times daily with a meal.   ONE TOUCH ULTRA TEST  test strip Generic drug: glucose blood USE 1 STRIP 3 TIMES A DAY TO CHECK BLOOD GLUCOSE LEVELS   rosuvastatin 5 MG tablet Commonly known as: CRESTOR Take 1 tablet (5 mg total) by mouth daily.   spironolactone 25 MG tablet Commonly known as: ALDACTONE Take 25 mg by mouth daily.   testosterone 50 MG/5GM (1%) Gel Commonly known as: ANDROGEL Place 5 g onto the skin daily. Please confirm with your cardiologist before resuming   ticagrelor 90 MG Tabs tablet Commonly known as: BRILINTA Take 1 tablet (90 mg total) by mouth 2 (two) times daily.   vitamin C 500 MG tablet Commonly known as: ASCORBIC ACID Take 500 mg by mouth daily.   Zinc Gluconate 30 MG Tabs Take 30 mg by mouth daily.        Allergies:  Allergies  Allergen Reactions   Sulfamethoxazole-Trimethoprim     Fatigue and muscle ache    Family History: Family History  Problem Relation Age of Onset   Hematuria Father    Kidney disease Neg Hx    Prostate cancer Neg Hx     Social History:   reports that he has been smoking cigars. He has never used smokeless tobacco. He reports that he does not drink alcohol and does not use drugs.  Physical Exam: BP 107/70   Pulse 81   Ht 5\' 11"  (1.803 m)   Wt 203 lb (92.1 kg)   BMI 28.31 kg/m   Constitutional:  Alert and oriented, no acute distress, nontoxic appearing HEENT: [place], AT Cardiovascular: No clubbing, cyanosis, or edema Respiratory: Normal respiratory effort, no increased work of breathing Skin: No rashes, bruises or suspicious lesions Neurologic: Grossly intact, no focal deficits, moving all 4 extremities Psychiatric: Normal mood and affect  Laboratory Data: Results for orders placed or performed in visit on 04/21/22  Microscopic Examination   Urine  Result Value Ref Range   WBC, UA >30 (H) 0 - 5 /hpf   RBC, Urine None seen 0 - 2 /hpf   Epithelial Cells (non renal) 0-10 0 - 10 /hpf   Bacteria, UA Many (A) None seen/Few  Urinalysis, Complete  Result  Value Ref Range   Specific Gravity, UA 1.025 1.005 - 1.030   pH, UA 5.0 5.0 - 7.5   Color, UA Yellow Yellow   Appearance Ur Cloudy (A) Clear   Leukocytes,UA 1+ (A) Negative   Protein,UA Negative Negative/Trace   Glucose, UA 3+ (A) Negative   Ketones, UA Negative Negative   RBC, UA Trace (A) Negative   Bilirubin, UA Negative Negative   Urobilinogen, Ur 0.2 0.2 - 1.0 mg/dL   Nitrite, UA Negative Negative   Microscopic Examination See below:   BLADDER SCAN AMB NON-IMAGING  Result Value Ref Range   Scan Result 83mL    Assessment & Plan:   1. Urinary frequency UA grossly  infected today consistent with recurrent versus persistent UTI.  Will start empiric Cipro and send for culture for further evaluation.  We will start suppressive antibiotics per culture results. - Urinalysis, Complete - BLADDER SCAN AMB NON-IMAGING - CULTURE, URINE COMPREHENSIVE - ciprofloxacin (CIPRO) 250 MG tablet; Take 1 tablet (250 mg total) by mouth 2 (two) times daily for 7 days.  Dispense: 14 tablet; Refill: 0  Return for Will call to start suppressive abx per culture results.  Carman Ching, PA-C  [facility] [address], [address] [place], [place] [address] [phone]

## 2022-04-24 LAB — CULTURE, URINE COMPREHENSIVE

## 2022-04-28 ENCOUNTER — Telehealth: Payer: Self-pay | Admitting: *Deleted

## 2022-04-28 NOTE — Telephone Encounter (Signed)
Spoke with wife was asked to call back later

## 2022-04-28 NOTE — Telephone Encounter (Signed)
-----   Message from Carman Ching, [place] sent at 04/24/2022  4:22 PM EDT ----- On culture appropriate Cipro. Please start suppressive trimethoprim 100mg  daily x6 months, take first dose the day after he stops Cipro. We discussed that he will likely tolerate trimethoprim despite his known Bactrim allergy, as typically these allergies are to the sulfa component.

## 2022-04-29 MED ORDER — TRIMETHOPRIM 100 MG PO TABS: 100.0000 mg | ORAL_TABLET | Freq: Every day | ORAL | 1 refills | Status: DC

## 2022-04-29 NOTE — Telephone Encounter (Signed)
Spoke with patient and advised results  rx sent to pharmacy by e-script   Pt was asking about a catheter order that you wrote for him? I advised the last order was back in 2021. Pt seen [facility] urology in May, looks like he may be thinking about surgery? Please advise about cath order??

## 2022-04-30 NOTE — Telephone Encounter (Signed)
Catheter order was placed for lifetime duration, no refills needed regardless of surgical plans.

## 2022-05-04 NOTE — Telephone Encounter (Signed)
Lmom with results below, advised pt to call if any questions.

## 2022-05-05 MED ORDER — NITROFURANTOIN MONOHYD MACRO 100 MG PO CAPS: 100.0000 mg | ORAL_CAPSULE | Freq: Every day | ORAL | 5 refills | Status: AC

## 2022-05-05 NOTE — Telephone Encounter (Signed)
Patient returned the call and was advised of the new medication prescription.  Patient expressed gratitude and understanding.

## 2022-05-05 NOTE — Addendum Note (Signed)
Addended by: Sueanne Margarita on: 05/05/2022 10:07 AM   Modules accepted: Orders

## 2022-05-05 NOTE — Telephone Encounter (Signed)
Patient returned call - information was provided to him.   Patient states that he started the trimethoprim and only was able to take for 2 days (extreme fatigue and low blood pressure). He is wanting to know if there is anything else he can take.  Please advise.

## 2022-05-05 NOTE — Telephone Encounter (Signed)
.  left message to have patient return my call.  rx sent to pharmacy by e-script  

## 2022-05-05 NOTE — Telephone Encounter (Signed)
OK to switch to Macrobid 100mg  daily x6 months instead of trimethoprim. Will add trimethoprim to his allergy list.

## 2022-11-13 ENCOUNTER — Other Ambulatory Visit: Payer: Self-pay | Admitting: Physician Assistant

## 2022-11-26 ENCOUNTER — Encounter: Payer: Self-pay | Admitting: Physician Assistant

## 2022-11-26 ENCOUNTER — Ambulatory Visit: Payer: Medicare PPO | Admitting: Physician Assistant

## 2022-11-26 VITALS — BP 103/66 | HR 91 | Ht 71.0 in | Wt 207.0 lb

## 2022-11-26 DIAGNOSIS — Z87440 Personal history of urinary (tract) infections: Secondary | ICD-10-CM

## 2022-11-26 DIAGNOSIS — N390 Urinary tract infection, site not specified: Secondary | ICD-10-CM

## 2022-11-26 MED ORDER — NITROFURANTOIN MONOHYD MACRO 100 MG PO CAPS: 100.0000 mg | ORAL_CAPSULE | Freq: Every day | ORAL | 3 refills | Status: DC

## 2022-11-26 NOTE — Progress Notes (Signed)
11/26/2022 2:14 PM   Casey Peters 1946/10/10 [id number]  CC: Chief Complaint  Patient presents with   Medication Refill   HPI: Casey Peters is a 77 y.o. male with PMH BXO and recurrent urethral stricture s/p remote urethral reconstruction managed with CIC and recurrent UTI on suppressive Macrobid x 6 months who presents today for follow-up on suppressive antibiotics.   Today he reports he may have had a UTI in December while on a cruise, however he is unsure.  Otherwise, he has been asymptomatic and is tolerating the Macrobid well.  He wishes to continue it.  In-office UA today positive for his lysed blood and 1+ leukocytes; urine microscopy with 6-10 WBCs/HPF and granular casts.  PMH: Past Medical History:  Diagnosis Date   Cardiomegaly    CHF (congestive heart failure) (HCC)    Constipation    Diabetic polyneuropathy (HCC)    Dilated idiopathic cardiomyopathy (HCC)    Erectile dysfunction    HLD (hyperlipidemia)    HTN (hypertension)    Low testosterone    Palpitations    Pneumonia    T2DM (type 2 diabetes mellitus) ([place])     Surgical History: Past Surgical History:  Procedure Laterality Date   CORONARY/GRAFT ACUTE MI REVASCULARIZATION N/A 12/27/2021   Procedure: Coronary/Graft Acute MI Revascularization;  Surgeon: Nelva Bush, MD;  Location: [place] CV [facility];  Service: Cardiovascular;  Laterality: N/A;   CYSTOSCOPY WITH URETHRAL DILATATION N/A 06/17/2021   Procedure: CYSTOSCOPY WITH URETHRAL DILATATION;  Surgeon: Abbie Sons, MD;  Location: [facility] ORS;  Service: Urology;  Laterality: N/A;  Optilume Balloon   HERNIA REPAIR  1998   LEFT HEART CATH AND CORONARY ANGIOGRAPHY N/A 12/27/2021   Procedure: LEFT HEART CATH AND CORONARY ANGIOGRAPHY;  Surgeon: Nelva Bush, MD;  Location: [place] CV [facility];  Service: Cardiovascular;  Laterality: N/A;   PILONIDAL CYST EXCISION     TONSILLECTOMY     URETHRA SURGERY  1995    Home Medications:   Allergies as of 11/26/2022       Reactions   Sulfamethoxazole-trimethoprim    Fatigue and muscle ache Other reaction(s): Other (See Comments) Fatigue and muscle ache   Trimethoprim Other (See Comments)   Fatigue, hypotension        Medication List        Accurate as of November 26, 2022  2:14 PM. If you have any questions, ask your nurse or doctor.          ascorbic acid 500 MG tablet Commonly known as: VITAMIN C Take 500 mg by mouth daily.   aspirin EC 81 MG tablet Take 81 mg by mouth daily.   carvedilol 3.125 MG tablet Commonly known as: COREG Take 3.125 mg by mouth 2 (two) times daily.   cholecalciferol 25 MCG (1000 UNIT) tablet Commonly known as: VITAMIN D3 Take 1,000 Units by mouth daily.   ezetimibe 10 MG tablet Commonly known as: ZETIA Take 10 mg by mouth daily.   Farxiga 5 MG Tabs tablet Generic drug: dapagliflozin propanediol Take 5 mg by mouth daily.   furosemide 20 MG tablet Commonly known as: LASIX Take 20 mg by mouth 2 (two) times daily.   metFORMIN 500 MG tablet Commonly known as: GLUCOPHAGE Take 1 tablet by mouth 2 (two) times daily with a meal.   omega-3 acid ethyl esters 1 g capsule Commonly known as: LOVAZA Take 2 capsules (2 g total) by mouth 2 (two) times daily with a meal.   ONE TOUCH  ULTRA TEST test strip Generic drug: glucose blood USE 1 STRIP 3 TIMES A DAY TO CHECK BLOOD GLUCOSE LEVELS   rosuvastatin 5 MG tablet Commonly known as: CRESTOR Take 1 tablet (5 mg total) by mouth daily.   spironolactone 25 MG tablet Commonly known as: ALDACTONE Take 25 mg by mouth daily.   testosterone 50 MG/5GM (1%) Gel Commonly known as: ANDROGEL Place 5 g onto the skin daily. Please confirm with your cardiologist before resuming   ticagrelor 90 MG Tabs tablet Commonly known as: BRILINTA Take 1 tablet (90 mg total) by mouth 2 (two) times daily.   Zinc Gluconate 30 MG Tabs Take 30 mg by mouth daily.        Allergies:  Allergies   Allergen Reactions   Sulfamethoxazole-Trimethoprim     Fatigue and muscle ache Other reaction(s): Other (See Comments) Fatigue and muscle ache   Trimethoprim Other (See Comments)    Fatigue, hypotension    Family History: Family History  Problem Relation Age of Onset   Hematuria Father    Kidney disease Neg Hx    Prostate cancer Neg Hx     Social History:   reports that he has been smoking cigars. He has never used smokeless tobacco. He reports that he does not drink alcohol and does not use drugs.  Physical Exam: BP 103/66   Pulse 91   Ht 5' 11"$  (1.803 m)   Wt 207 lb (93.9 kg)   BMI 28.87 kg/m   Constitutional:  Alert and oriented, no acute distress, nontoxic appearing HEENT: [place], AT Cardiovascular: No clubbing, cyanosis, or edema Respiratory: Normal respiratory effort, no increased work of breathing Skin: No rashes, bruises or suspicious lesions Neurologic: Grossly intact, no focal deficits, moving all 4 extremities Psychiatric: Normal mood and affect  Laboratory Data: Results for orders placed or performed in visit on 11/26/22  Microscopic Examination   Urine  Result Value Ref Range   WBC, UA 6-10 (A) 0 - 5 /hpf   RBC, Urine 0-2 0 - 2 /hpf   Epithelial Cells (non renal) 0-10 0 - 10 /hpf   Casts Present (A) None seen /lpf   Cast Type Granular casts (A) N/A   Bacteria, UA Few None seen/Few  Urinalysis, Complete  Result Value Ref Range   Specific Gravity, UA 1.025 1.005 - 1.030   pH, UA 5.0 5.0 - 7.5   Color, UA Yellow Yellow   Appearance Ur Clear Clear   Leukocytes,UA 1+ (A) Negative   Protein,UA Negative Negative/Trace   Glucose, UA Negative Negative   Ketones, UA Negative Negative   RBC, UA Trace (A) Negative   Bilirubin, UA Negative Negative   Urobilinogen, Ur 0.2 0.2 - 1.0 mg/dL   Nitrite, UA Negative Negative   Microscopic Examination See below:    Assessment & Plan:   1. Recurrent UTI Well-managed on suppressive Macrobid.  Since he continues to  self catheterize, I suspect that stopping daily suppressive therapy will lead to a recurrence of his frequent UTIs.  We discussed the risks of long-term Macrobid including pulmonary and hepatic toxicities and antibiotic resistance.  I offered him Hiprex as an alternative that would not contribute to antibiotic resistance.  He stated that he understands the risks of long-term Macrobid, but since he is well-managed on it he wishes to continue it, which is reasonable. - Urinalysis, Complete - nitrofurantoin, macrocrystal-monohydrate, (MACROBID) 100 MG capsule; Take 1 capsule (100 mg total) by mouth daily.  Dispense: 90 capsule; Refill: 3  Return in about 1 year (around 11/27/2023) for Annual rUTI follow-up.  Debroah Loop, PA-C  [facility] [address], [place], [place] [address] [phone]

## 2022-11-27 LAB — MICROSCOPIC EXAMINATION

## 2022-11-27 LAB — URINALYSIS, COMPLETE
Bilirubin, UA: NEGATIVE
Glucose, UA: NEGATIVE
Ketones, UA: NEGATIVE
Nitrite, UA: NEGATIVE
Protein,UA: NEGATIVE
Specific Gravity, UA: 1.025 (ref 1.005–1.030)
Urobilinogen, Ur: 0.2 mg/dL (ref 0.2–1.0)
pH, UA: 5 (ref 5.0–7.5)

## 2023-03-05 ENCOUNTER — Encounter: Payer: Self-pay | Admitting: Physician Assistant

## 2023-03-05 ENCOUNTER — Ambulatory Visit (INDEPENDENT_AMBULATORY_CARE_PROVIDER_SITE_OTHER): Payer: Medicare PPO | Admitting: Physician Assistant

## 2023-03-05 VITALS — BP 129/79 | HR 83 | Ht 71.0 in | Wt 207.0 lb

## 2023-03-05 DIAGNOSIS — R82998 Other abnormal findings in urine: Secondary | ICD-10-CM | POA: Diagnosis not present

## 2023-03-05 DIAGNOSIS — M545 Low back pain, unspecified: Secondary | ICD-10-CM

## 2023-03-05 DIAGNOSIS — R109 Unspecified abdominal pain: Secondary | ICD-10-CM

## 2023-03-05 DIAGNOSIS — G8929 Other chronic pain: Secondary | ICD-10-CM | POA: Diagnosis not present

## 2023-03-05 DIAGNOSIS — R896 Abnormal cytological findings in specimens from other organs, systems and tissues: Secondary | ICD-10-CM

## 2023-03-05 DIAGNOSIS — N411 Chronic prostatitis: Secondary | ICD-10-CM

## 2023-03-05 LAB — MICROSCOPIC EXAMINATION

## 2023-03-05 LAB — BLADDER SCAN AMB NON-IMAGING: Scan Result: 30 ml

## 2023-03-05 MED ORDER — LEVOFLOXACIN 500 MG PO TABS
500.0000 mg | ORAL_TABLET | Freq: Every day | ORAL | 0 refills | Status: DC
Start: 2023-03-05 — End: 2023-09-17

## 2023-03-05 NOTE — Patient Instructions (Addendum)
Please stop daily Macrobid while on Levaquin for the next 4 weeks. Please also avoid strenuous activity while on Levaquin due to the increased risk for tendon rupture on this medication.

## 2023-03-05 NOTE — Progress Notes (Signed)
03/05/2023 3:13 PM   Casey Peters July 28, 1946 [id number]  CC: Chief Complaint  Patient presents with   Follow-up   HPI: Casey Peters is a 77 y.o. male with PMH BXO and recurrent urethral stricture s/p remote urethral reconstruction managed with CIC and recurrent UTI on suppressive Macrobid who presents today for evaluation of possible UTI.   Today he reports chronic suprapubic and low back pain x 10 months that he attributes to his prostate that acutely worsened last week.  He denies fever, chills, nausea, vomiting, and perineal fullness.  He underwent prostate biopsy in 2016 which was negative for malignancy but notable for chronic inflammation.  In-office UA today positive for trace intact blood and 1+ leukocytes; urine microscopy with 11-30 WBCs/HPF, 3-10 RBCs/HPF, granular casts, uric acid crystals, and few bacteria. PVR 30mL.  PMH: Past Medical History:  Diagnosis Date   Cardiomegaly    CHF (congestive heart failure) (HCC)    Constipation    Diabetic polyneuropathy (HCC)    Dilated idiopathic cardiomyopathy (HCC)    Erectile dysfunction    HLD (hyperlipidemia)    HTN (hypertension)    Low testosterone    Palpitations    Pneumonia    T2DM (type 2 diabetes mellitus) (HCC)     Surgical History: Past Surgical History:  Procedure Laterality Date   CORONARY/GRAFT ACUTE MI REVASCULARIZATION N/A 12/27/2021   Procedure: Coronary/Graft Acute MI Revascularization;  Surgeon: Yvonne Kendall, MD;  Location: [facility] INVASIVE CV LAB;  Service: Cardiovascular;  Laterality: N/A;   CYSTOSCOPY WITH URETHRAL DILATATION N/A 06/17/2021   Procedure: CYSTOSCOPY WITH URETHRAL DILATATION;  Surgeon: [name], MD;  Location: [facility] ORS;  Service: Urology;  Laterality: N/A;  Optilume Balloon   HERNIA REPAIR  1998   LEFT HEART CATH AND CORONARY ANGIOGRAPHY N/A 12/27/2021   Procedure: LEFT HEART CATH AND CORONARY ANGIOGRAPHY;  Surgeon: Yvonne Kendall, MD;  Location: [facility]  INVASIVE CV LAB;  Service: Cardiovascular;  Laterality: N/A;   PILONIDAL CYST EXCISION     TONSILLECTOMY     URETHRA SURGERY  1995    Home Medications:  Allergies as of 03/05/2023       Reactions   Sulfamethoxazole-trimethoprim    Fatigue and muscle ache Other reaction(s): Other (See Comments) Fatigue and muscle ache   Trimethoprim Other (See Comments)   Fatigue, hypotension        Medication List        Accurate as of Mar 05, 2023  3:13 PM. If you have any questions, ask your nurse or doctor.          STOP taking these medications    ezetimibe 10 MG tablet Commonly known as: ZETIA Stopped by: Carman Ching, PA-C   Farxiga 5 MG Tabs tablet Generic drug: dapagliflozin propanediol Stopped by: Carman Ching, PA-C   furosemide 20 MG tablet Commonly known as: LASIX Stopped by: Carman Ching, PA-C   ticagrelor 90 MG Tabs tablet Commonly known as: BRILINTA Stopped by: Carman Ching, PA-C       TAKE these medications    ascorbic acid 500 MG tablet Commonly known as: VITAMIN C Take 500 mg by mouth daily.   aspirin EC 81 MG tablet Take 81 mg by mouth daily.   carvedilol 3.125 MG tablet Commonly known as: COREG Take 3.125 mg by mouth 2 (two) times daily.   cholecalciferol 25 MCG (1000 UNIT) tablet Commonly known as: VITAMIN D3 Take 1,000 Units by mouth daily.   metFORMIN 500 MG tablet Commonly known as:  GLUCOPHAGE Take 1 tablet by mouth 2 (two) times daily with a meal.   nitrofurantoin (macrocrystal-monohydrate) 100 MG capsule Commonly known as: MACROBID Take 1 capsule (100 mg total) by mouth daily.   omega-3 acid ethyl esters 1 g capsule Commonly known as: LOVAZA Take 2 capsules (2 g total) by mouth 2 (two) times daily with a meal.   ONE TOUCH ULTRA TEST test strip Generic drug: glucose blood USE 1 STRIP 3 TIMES A DAY TO CHECK BLOOD GLUCOSE LEVELS   rosuvastatin 5 MG tablet Commonly known as: CRESTOR Take 1  tablet (5 mg total) by mouth daily.   spironolactone 25 MG tablet Commonly known as: ALDACTONE Take 25 mg by mouth daily.   testosterone 50 MG/5GM (1%) Gel Commonly known as: ANDROGEL Place 5 g onto the skin daily. Please confirm with your cardiologist before resuming   Zinc Gluconate 30 MG Tabs Take 30 mg by mouth daily.        Allergies:  Allergies  Allergen Reactions   Sulfamethoxazole-Trimethoprim     Fatigue and muscle ache Other reaction(s): Other (See Comments) Fatigue and muscle ache   Trimethoprim Other (See Comments)    Fatigue, hypotension    Family History: Family History  Problem Relation Age of Onset   Hematuria Father    Kidney disease Neg Hx    Prostate cancer Neg Hx     Social History:   reports that he has been smoking cigars. He has never used smokeless tobacco. He reports that he does not drink alcohol and does not use drugs.  Physical Exam: BP 129/79   Pulse 83   Ht 5\' 11"  (1.803 m)   Wt 207 lb (93.9 kg)   BMI 28.87 kg/m   Constitutional:  Alert and oriented, no acute distress, nontoxic appearing HEENT: , AT Cardiovascular: No clubbing, cyanosis, or edema Respiratory: Normal respiratory effort, no increased work of breathing GU: Normal sphincter tone.  Smooth, symmetrically enlarged 40+ cc prostate without nodules or induration.  Prostate is mildly boggy on palpation. Skin: No rashes, bruises or suspicious lesions Neurologic: Grossly intact, no focal deficits, moving all 4 extremities Psychiatric: Normal mood and affect  Laboratory Data: Results for orders placed or performed in visit on 03/05/23  BLADDER SCAN AMB NON-IMAGING  Result Value Ref Range   Scan Result 30 ml   Assessment & Plan:   1. Chronic prostatitis 1 week of acute on chronic suprapubic and low back pain associated with a suspicious UA, boggy prostate on exam, and with a history of chronic inflammation on past prostate biopsy.  I suspect chronic bacterial  prostatitis.  Will treat with Levaquin x 4 weeks.  I counseled him to stop suppressive Macrobid while on Levaquin and to avoid strenuous activity while on this medication.  I asked him to return to clinic if his symptoms do not resolve, at which point would recommend pursuing imaging given uric acid crystals on UA, though overall I have low suspicion for uric acid urolithiasis with no history of stones. - Urinalysis, Complete - BLADDER SCAN AMB NON-IMAGING - CULTURE, URINE COMPREHENSIVE - levofloxacin (LEVAQUIN) 500 MG tablet; Take 1 tablet (500 mg total) by mouth daily.  Dispense: 28 tablet; Refill: 0   Return if symptoms worsen or fail to improve.  Carman Ching, PA-C  [facility] [place] [address], [address] [place], [place] [address] [phone]

## 2023-03-09 LAB — CULTURE, URINE COMPREHENSIVE

## 2023-07-07 IMAGING — DX DG CHEST 1V PORT
1 series · 1 of 1 positions shown · non-contrast
Comparison: Chest two views 08/02/2020

CLINICAL DATA: Chest pain.

EXAM:
PORTABLE CHEST 1 VIEW

[chest ap]
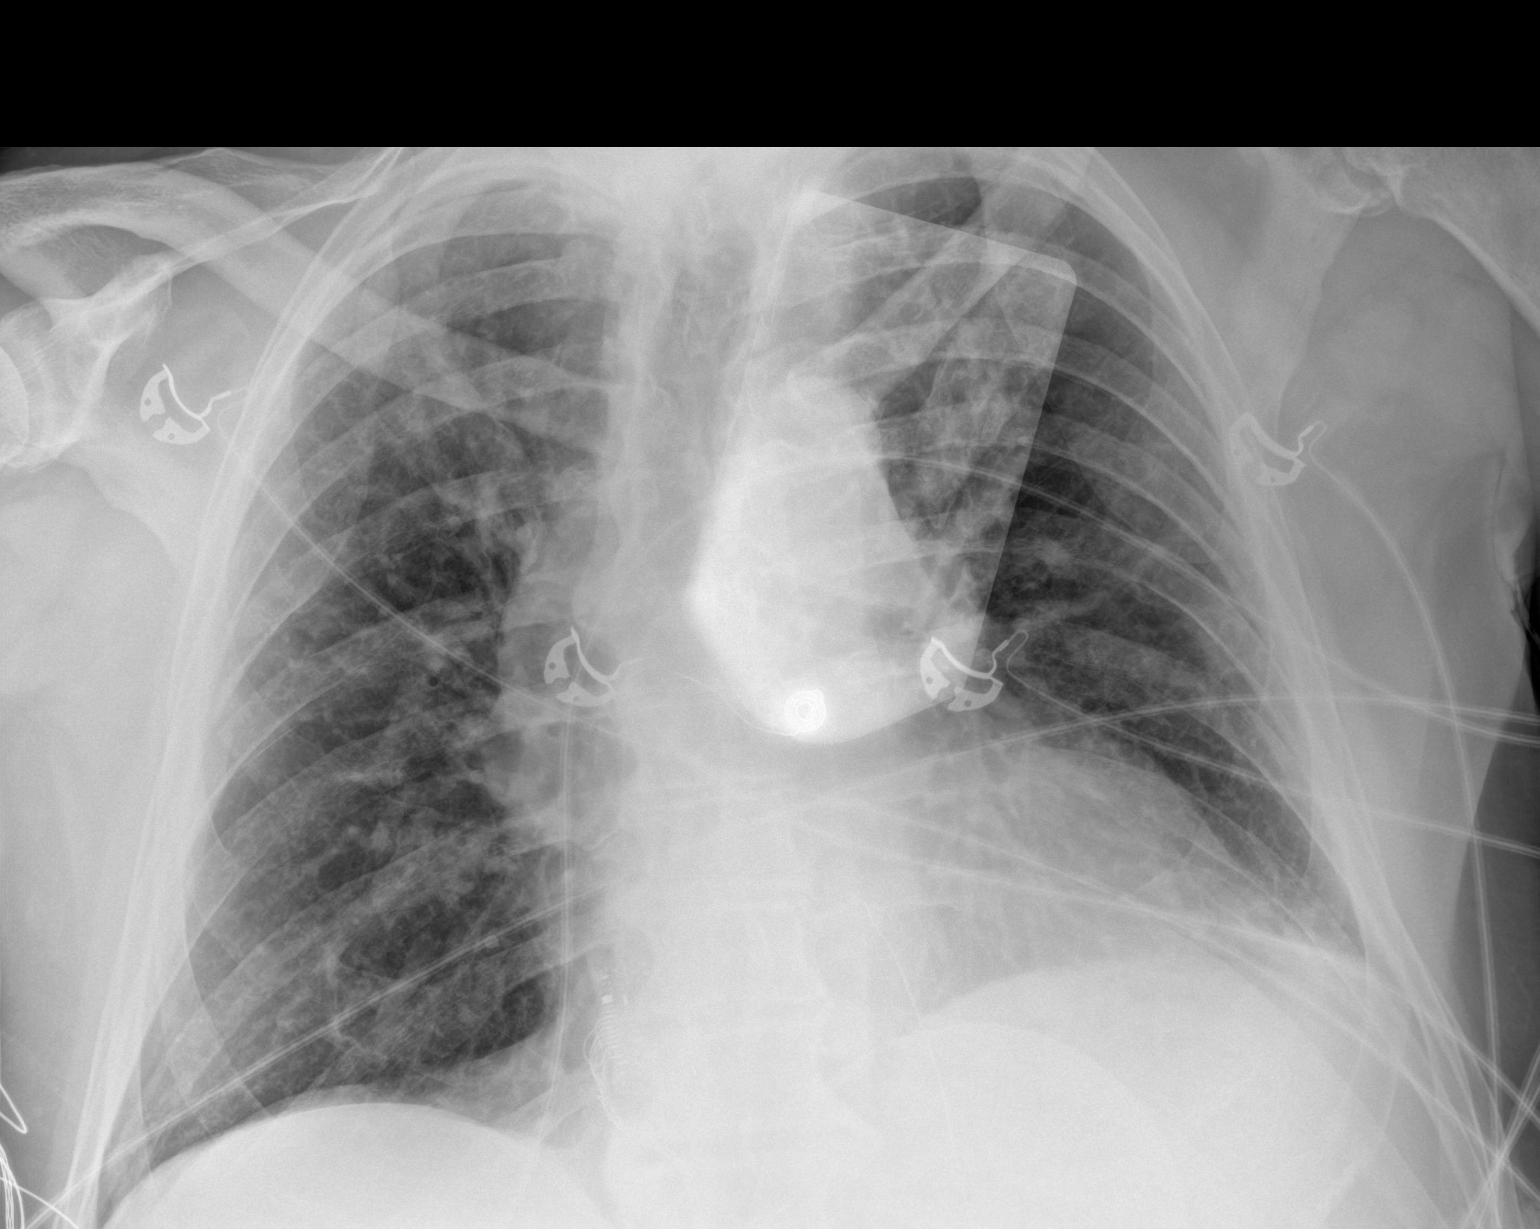

[1 of 1 positions shown; findings below may reference images not displayed]

FINDINGS: Single frontal view of the chest. Overlying possible cardiac pacer
or defibrillator pad over the superomedial left hemithorax.

Cardiac silhouette is mildly enlarged. Mediastinal contours are
within normal limits. Possible minimal interstitial thickening
compared to prior. No definite pleural effusion. No pneumothorax.
Moderate multilevel degenerative disc changes of the thoracic spine.
IMPRESSION: Possible mild interstitial pulmonary edema.

## 2023-08-03 ENCOUNTER — Other Ambulatory Visit: Payer: Self-pay | Admitting: Physician Assistant

## 2023-08-03 DIAGNOSIS — N390 Urinary tract infection, site not specified: Secondary | ICD-10-CM

## 2023-09-17 ENCOUNTER — Ambulatory Visit
Admission: EM | Admit: 2023-09-17 | Discharge: 2023-09-17 | Disposition: A | Discharge status: Home / Self Care | Payer: Medicare PPO | Attending: Physician Assistant | Admitting: Physician Assistant

## 2023-09-17 ENCOUNTER — Ambulatory Visit
Admission: EM | Admit: 2023-09-17 | Discharge: 2023-09-17 | Discharge status: Against Medical Advice | Payer: Medicare PPO

## 2023-09-17 DIAGNOSIS — N390 Urinary tract infection, site not specified: Secondary | ICD-10-CM | POA: Diagnosis present

## 2023-09-17 DIAGNOSIS — R319 Hematuria, unspecified: Secondary | ICD-10-CM | POA: Insufficient documentation

## 2023-09-17 LAB — POCT URINALYSIS DIP (MANUAL ENTRY)
Bilirubin, UA: NEGATIVE
Glucose, UA: NEGATIVE mg/dL
Ketones, POC UA: NEGATIVE mg/dL
Nitrite, UA: NEGATIVE
Protein Ur, POC: NEGATIVE mg/dL
Spec Grav, UA: >=1.03 — AB (ref 1.010–1.025)
Urobilinogen, UA: 0.2 U/dL
pH, UA: 5 (ref 5.0–8.0)

## 2023-09-17 MED ORDER — LEVOFLOXACIN 750 MG PO TABS: 750.0000 mg | ORAL_TABLET | Freq: Every day | ORAL | 0 refills | Status: DC

## 2023-09-17 NOTE — Discharge Instructions (Signed)
Follow up with your Urologist for recheck

## 2023-09-17 NOTE — ED Triage Notes (Signed)
Triaged by provider  

## 2023-09-17 NOTE — ED Provider Notes (Addendum)
[name]    CSN: [id number] Arrival date & time: 09/17/23  1343      History   Chief Complaint No chief complaint on file.   HPI Casey Peters is a 77 y.o. male.   Patient complains of foul-smelling urine and discomfort with urination.  Patient reports that he has frequent urinary tract infections.  Patient has been seen by urology for the same.  Patient states that he normally gets better with antibiotics.  Patient reports he has not had any fever he has not had any nausea or vomiting.  Patient denies any diarrhea.  He denies any abdominal pain but does have some discomfort when he urinates.     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   S/P cardiac catheterization 12/27/2021   Dilated idiopathic cardiomyopathy (HCC)    Hypertriglyceridemia    HFrEF (heart failure with reduced ejection fraction) (HCC)    Palpitations 02/08/2020   Low testosterone 10/26/2019   Pain of finger 06/29/2019   Chronic systolic heart failure (HCC) 10/25/2018   Tobacco use 10/25/2018   Rising PSA level 07/10/2015   Microscopic hematuria 07/10/2015   Type 2 diabetes mellitus (HCC) 06/08/2014   HLD (hyperlipidemia) 06/08/2014   BP (high blood pressure) 06/08/2014   Obesity, diabetes, and hypertension syndrome (HCC) 06/08/2014   Adiposity 06/08/2014   Controlled type 2 diabetes mellitus without complication (HCC) 06/08/2014    Past Surgical History:  Procedure  Laterality Date   CORONARY/GRAFT ACUTE MI REVASCULARIZATION N/A 12/27/2021   Procedure: Coronary/Graft Acute MI Revascularization;  Surgeon: Yvonne Kendall, MD;  Location: [facility];  Service: Cardiovascular;  Laterality: N/A;   CYSTOSCOPY WITH URETHRAL DILATATION N/A 06/17/2021   Procedure: CYSTOSCOPY WITH URETHRAL DILATATION;  Surgeon: [name], MD;  Location: [facility];  Service: Urology;  Laterality: N/A;  Optilume Balloon   HERNIA REPAIR  1998   LEFT HEART CATH AND CORONARY ANGIOGRAPHY N/A 12/27/2021   Procedure: LEFT HEART CATH AND CORONARY ANGIOGRAPHY;  Surgeon: Yvonne Kendall, MD;  Location: [facility];  Service: Cardiovascular;  Laterality: N/A;   PILONIDAL CYST EXCISION     TONSILLECTOMY     URETHRA SURGERY  1995       Home Medications    Prior to Admission medications   Medication Sig Start Date End Date Taking? Authorizing Provider  aspirin EC 81 MG tablet Take 81 mg by mouth daily.    [provider]  carvedilol (COREG) 3.125 MG tablet Take 3.125 mg by mouth 2 (two) times daily. 05/19/18   [provider]  cholecalciferol (VITAMIN D3) 25 MCG (1000 UNIT) tablet Take 1,000 Units by mouth daily.    [provider]  levofloxacin (LEVAQUIN) 500 MG tablet Take 1 tablet (500 mg total) by mouth daily. 03/05/23   Vaillancourt, Lelon Mast, PA-C  metFORMIN (GLUCOPHAGE) 500 MG tablet Take 1 tablet by mouth 2 (two) times daily with  a meal. 10/14/21   [provider]  nitrofurantoin, macrocrystal-monohydrate, (MACROBID) 100 MG capsule Take 1 capsule (100 mg total) by mouth daily. 11/26/22 11/21/23  Carman Ching, PA-C  omega-3 acid ethyl esters (LOVAZA) 1 g capsule Take 2 capsules (2 g total) by mouth 2 (two) times daily with a meal. 12/29/21   Arnetha Courser, MD  ONE TOUCH ULTRA TEST test strip USE 1 STRIP 3 TIMES A DAY TO CHECK BLOOD GLUCOSE LEVELS 09/24/18   [provider]  rosuvastatin (CRESTOR) 5 MG tablet Take 1  tablet (5 mg total) by mouth daily. 12/30/21   Arnetha Courser, MD  spironolactone (ALDACTONE) 25 MG tablet Take 25 mg by mouth daily. 02/21/18 12/28/21  [provider]  testosterone (ANDROGEL) 50 MG/5GM (1%) GEL Place 5 g onto the skin daily. Please confirm with your cardiologist before resuming 12/29/21   Arnetha Courser, MD  vitamin C (ASCORBIC ACID) 500 MG tablet Take 500 mg by mouth daily.    [provider]  Zinc Gluconate 30 MG TABS Take 30 mg by mouth daily.    [provider]    Family History Family History  Problem Relation Age of Onset   Hematuria Father    Kidney disease Neg Hx    Prostate cancer Neg Hx     Social History Social History   Tobacco Use   Smoking status: Every Day    Types: Cigars   Smokeless tobacco: Never   Tobacco comments:    2-3 cigars per day  Vaping Use   Vaping status: Never Used  Substance Use Topics   Alcohol use: No    Alcohol/week: 0.0 standard drinks of alcohol   Drug use: No     Allergies   Sulfamethoxazole-trimethoprim and Trimethoprim   Review of Systems Review of Systems  All other systems reviewed and are negative.    Physical Exam Triage Vital Signs ED Triage Vitals  Encounter Vitals Group     BP      Systolic BP Percentile      Diastolic BP Percentile      Pulse      Resp      Temp      Temp src      SpO2      Weight      Height      Head Circumference      Peak Flow      Pain Score      Pain Loc      Pain Education      Exclude from Growth Chart    No data found.  Updated Vital Signs There were no vitals taken for this visit.  Visual Acuity Right Eye Distance:   Left Eye Distance:   Bilateral Distance:    Right Eye Near:   Left Eye Near:    Bilateral Near:     Physical Exam Vitals and nursing note reviewed.  Constitutional:      Appearance: He is well-developed.  HENT:     Head: Normocephalic.  Cardiovascular:     Rate and Rhythm: Normal rate.  Pulmonary:      Effort: Pulmonary effort is normal.  Abdominal:     General: There is no distension.  Musculoskeletal:        General: Normal range of motion.     Cervical back: Normal range of motion.  Skin:    General: Skin is warm.  Neurological:     General: No focal deficit present.  Mental Status: He is alert and oriented to person, place, and time.      UC Treatments / Results  Labs (all labs ordered are listed, but only abnormal results are displayed) Labs Reviewed  POCT URINALYSIS DIP (MANUAL ENTRY)    EKG   Radiology No results found.  Procedures Procedures (including critical care time)  Medications Ordered in UC Medications - No data to display  Initial Impression / Assessment and Plan / UC Course  I have reviewed the triage vital signs and the nursing notes.  Pertinent labs & imaging results that were available during my care of the patient were reviewed by me and considered in my medical decision making (see chart for details).     Neurology notes reviewed.  Patient was last treated with Levaquin.  Patient states this worked well for him.  Patient is supposed to be on Macrobid daily from urology.  Patient is allergic to Bactrim.  I will treat patient for urinary tract infection he is advised to follow-up with his urologist or his primary care physician for recheck. Final Clinical Impressions(s) / UC Diagnoses   Final diagnoses:  Urinary tract infection with hematuria, site unspecified   Discharge Instructions   None    ED Prescriptions   None    PDMP not reviewed this encounter. An After Visit Summary was printed and given to the patient.          Elson Areas, PA-C 09/17/23 1510    Elson Areas, [place] 09/17/23 1512

## 2023-09-19 LAB — URINE CULTURE

## 2023-10-22 ENCOUNTER — Ambulatory Visit: Payer: Medicare PPO | Admitting: Physician Assistant

## 2023-10-22 ENCOUNTER — Encounter: Payer: Self-pay | Admitting: Physician Assistant

## 2023-10-22 VITALS — BP 141/83 | HR 79 | Ht 71.0 in | Wt 207.0 lb

## 2023-10-22 DIAGNOSIS — N411 Chronic prostatitis: Secondary | ICD-10-CM

## 2023-10-22 DIAGNOSIS — R82998 Other abnormal findings in urine: Secondary | ICD-10-CM

## 2023-10-22 DIAGNOSIS — R300 Dysuria: Secondary | ICD-10-CM | POA: Diagnosis not present

## 2023-10-22 DIAGNOSIS — R972 Elevated prostate specific antigen [PSA]: Secondary | ICD-10-CM

## 2023-10-22 LAB — URINALYSIS, COMPLETE
Bilirubin, UA: NEGATIVE
Glucose, UA: NEGATIVE
Ketones, UA: NEGATIVE
Nitrite, UA: NEGATIVE
Protein,UA: NEGATIVE
Specific Gravity, UA: >1.03 — ABNORMAL HIGH (ref 1.005–1.030)
Urobilinogen, Ur: 0.2 mg/dL (ref 0.2–1.0)
pH, UA: 5 (ref 5.0–7.5)

## 2023-10-22 LAB — MICROSCOPIC EXAMINATION

## 2023-10-22 LAB — BLADDER SCAN AMB NON-IMAGING: PVR: 0 WU

## 2023-10-22 MED ORDER — CIPROFLOXACIN HCL 500 MG PO TABS: 500.0000 mg | ORAL_TABLET | Freq: Two times a day (BID) | ORAL | 0 refills | Status: DC

## 2023-10-22 NOTE — Progress Notes (Signed)
 10/22/2023 10:17 AM   Casey Peters 01-20-1946 [id number]  CC: Chief Complaint  Patient presents with   Prostatitis   HPI: Casey Peters is a 78 y.o. 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   He went to urgent care about a month ago with reports of malodorous urine and dysuria.  He was treated with Levaquin  750 mg x 7 days.  UA was notable for trace intact blood and trace leukocytes, no nitrites.  Urine culture grew multiple species.  He had a repeat urinalysis 2 weeks later that was notable for pyuria.  Today he reports he had some symptomatic improvement with Levaquin , but they did not fully resolved.  He denies fevers.  He describes perineal pressure, frequency, and urgency.  He also expresses concern regarding recent rise in his PSA.  He denies metal or devices in his body and is not claustrophobic.  Recent history as follows: 09/28/2023: 1.48 04/26/2023: 1.9 11/06/2022: 0.78 12/22/2021: 1.33 05/02/2021: 2.5 03/27/2021: 4.1 11/07/2020: 1.9 09/25/2020: 3.63 09/19/2018: 1.67  In-office UA today positive for trace intact blood and 1+ leukocytes; urine microscopy with 11-30 WBCs/HPF. PVR 0mL.  PMH: Past Medical History:  Diagnosis Date   Cardiomegaly    CHF (congestive heart failure) (HCC)    Constipation    Diabetic polyneuropathy (HCC)    Dilated idiopathic cardiomyopathy (HCC)    Erectile dysfunction    HLD (hyperlipidemia)    HTN (hypertension)    Low testosterone     Palpitations    Pneumonia    T2DM (type 2 diabetes mellitus) (HCC)     Surgical History: Past Surgical History:  Procedure Laterality Date   CORONARY/GRAFT ACUTE MI REVASCULARIZATION N/A 12/27/2021   Procedure: Coronary/Graft Acute MI Revascularization;  Surgeon:  Mady Bruckner, MD;  Location: [facility];  Service: Cardiovascular;  Laterality: N/A;   CYSTOSCOPY WITH URETHRAL DILATATION N/A 06/17/2021   Procedure: CYSTOSCOPY WITH URETHRAL DILATATION;  Surgeon: Twylla Glendia BROCKS, MD;  Location: [facility];  Service: Urology;  Laterality: N/A;  Optilume Balloon   HERNIA REPAIR  1998   LEFT HEART CATH AND CORONARY ANGIOGRAPHY N/A 12/27/2021   Procedure: LEFT HEART CATH AND CORONARY ANGIOGRAPHY;  Surgeon: Mady Bruckner, MD;  Location: [facility];  Service: Cardiovascular;  Laterality: N/A;   PILONIDAL CYST EXCISION     TONSILLECTOMY     URETHRA SURGERY  1995    Home Medications:  Allergies as of 10/22/2023       Reactions   Sulfamethoxazole -trimethoprim     Fatigue and muscle ache Other reaction(s): Other (See Comments) Fatigue and muscle ache   Trimethoprim  Other (See Comments)   Fatigue, hypotension        Medication List        Accurate as of October 22, 2023 10:17 AM. If you have any questions, ask your nurse or doctor.          ascorbic acid 500 MG tablet Commonly known as: VITAMIN C Take 500 mg by mouth daily.   aspirin  EC 81 MG tablet Take 81 mg by mouth daily.   carvedilol  3.125 MG tablet Commonly known as: COREG  Take 3.125 mg by mouth 2 (two) times daily.   cholecalciferol 25 MCG (1000 UNIT) tablet Commonly known as: VITAMIN D3 Take 1,000 Units by mouth daily.   levofloxacin   750 MG tablet Commonly known as: Levaquin  Take 1 tablet (750 mg total) by mouth daily.   metFORMIN  500 MG tablet Commonly known as: GLUCOPHAGE  Take 1 tablet by mouth 2 (two) times daily with a meal.   omega-3 acid ethyl esters 1 g capsule Commonly known as: LOVAZA  Take 2 capsules (2 g total) by mouth 2 (two) times daily with a meal.   ONE TOUCH ULTRA TEST test strip Generic drug: glucose blood USE 1 STRIP 3 TIMES A DAY TO CHECK BLOOD GLUCOSE LEVELS   rosuvastatin  5 MG tablet Commonly known as: CRESTOR  Take 1 tablet  (5 mg total) by mouth daily.   spironolactone  25 MG tablet Commonly known as: ALDACTONE  Take 25 mg by mouth daily.   testosterone  50 MG/5GM (1%) Gel Commonly known as: ANDROGEL  Place 5 g onto the skin daily. Please confirm with your cardiologist before resuming   Zinc Gluconate 30 MG Tabs Take 30 mg by mouth daily.        Allergies:  Allergies  Allergen Reactions   Sulfamethoxazole -Trimethoprim      Fatigue and muscle ache Other reaction(s): Other (See Comments) Fatigue and muscle ache   Trimethoprim  Other (See Comments)    Fatigue, hypotension    Family History: Family History  Problem Relation Age of Onset   Hematuria Father    Kidney disease Neg Hx    Prostate cancer Neg Hx     Social History:   reports that he has been smoking cigars. He has never used smokeless tobacco. He reports that he does not drink alcohol and does not use drugs.  Physical Exam: BP (!) 141/83   Pulse 79   Ht 5' 11 (1.803 m)   Wt 207 lb (93.9 kg)   BMI 28.87 kg/m   Constitutional:  Alert and oriented, no acute distress, nontoxic appearing HEENT: [place], AT Cardiovascular: No clubbing, cyanosis, or edema Respiratory: Normal respiratory effort, no increased work of breathing Skin: No rashes, bruises or suspicious lesions Neurologic: Grossly intact, no focal deficits, moving all 4 extremities Psychiatric: Normal mood and affect  Laboratory Data: Results for orders placed or performed in visit on 10/22/23  Microscopic Examination   Collection Time: 10/22/23 10:03 AM   Urine  Result Value Ref Range   WBC, UA 11-30 (A) 0 - 5 /hpf   RBC, Urine 0-2 0 - 2 /hpf   Epithelial Cells (non renal) 0-10 0 - 10 /hpf   Bacteria, UA Few None seen/Few  Urinalysis, Complete   Collection Time: 10/22/23 10:03 AM  Result Value Ref Range   Specific Gravity, UA >1.030 (H) 1.005 - 1.030   pH, UA 5.0 5.0 - 7.5   Color, UA Yellow Yellow   Appearance Ur Clear Clear   Leukocytes,UA 1+ (A) Negative    Protein,UA Negative Negative/Trace   Glucose, UA Negative Negative   Ketones, UA Negative Negative   RBC, UA Trace (A) Negative   Bilirubin, UA Negative Negative   Urobilinogen, Ur 0.2 0.2 - 1.0 mg/dL   Nitrite, UA Negative Negative   Microscopic Examination See below:   Bladder Scan (Post Void Residual) in office   Collection Time: 10/22/23 10:10 AM  Result Value Ref Range   PVR 0.0 WU  CULTURE, URINE COMPREHENSIVE   Collection Time: 10/22/23 10:49 AM   Specimen: Urine   UR  Result Value Ref Range   Urine Culture, Comprehensive Final report    Organism ID, Bacteria Comment    Assessment & Plan:   1. Chronic prostatitis (Primary)  I suspect persistent chronic bacterial prostatitis with only 1 week of antibiotics last month.  Will start empiric Cipro  x 4 weeks and send for culture for further analysis.  Will also obtain MR prostate to rule out underlying abscess or nodule that may warrant biopsy. - Urinalysis, Complete - Bladder Scan (Post Void Residual) in office - CULTURE, URINE COMPREHENSIVE - ciprofloxacin  (CIPRO ) 500 MG tablet; Take 1 tablet (500 mg total) by mouth 2 (two) times daily for 28 days.  Dispense: 56 tablet; Refill: 0 - MR PROSTATE W WO CONTRAST; Future  2. Rising PSA level See above.  I suspect his PSA fluctuation/rise his secondary to chronic prostatitis as above. - MR PROSTATE W WO CONTRAST; Future   Return for Will call with results.  Lucie Hones, PA-C  [facility] [place] [address] [place], [place] [address] [phone]

## 2023-10-25 LAB — CULTURE, URINE COMPREHENSIVE

## 2023-11-08 ENCOUNTER — Ambulatory Visit: Payer: Medicare PPO | Admitting: Physician Assistant

## 2023-11-08 VITALS — BP 118/71 | HR 78 | Ht 71.0 in | Wt 208.0 lb

## 2023-11-08 DIAGNOSIS — R3912 Poor urinary stream: Secondary | ICD-10-CM

## 2023-11-08 DIAGNOSIS — N411 Chronic prostatitis: Secondary | ICD-10-CM | POA: Diagnosis not present

## 2023-11-08 DIAGNOSIS — R300 Dysuria: Secondary | ICD-10-CM

## 2023-11-08 LAB — MICROSCOPIC EXAMINATION

## 2023-11-08 LAB — URINALYSIS, COMPLETE
Bilirubin, UA: NEGATIVE
Glucose, UA: NEGATIVE
Nitrite, UA: NEGATIVE
Protein,UA: NEGATIVE
RBC, UA: NEGATIVE
Specific Gravity, UA: >1.03 — ABNORMAL HIGH (ref 1.005–1.030)
Urobilinogen, Ur: 0.2 mg/dL (ref 0.2–1.0)
pH, UA: 5 (ref 5.0–7.5)

## 2023-11-08 LAB — BLADDER SCAN AMB NON-IMAGING: Scan Result: 64

## 2023-11-08 MED ORDER — SILODOSIN 8 MG PO CAPS: 8.0000 mg | ORAL_CAPSULE | Freq: Every day | ORAL | 11 refills | Status: DC

## 2023-11-08 MED ORDER — AMOXICILLIN 500 MG PO TABS: 1000.0000 mg | ORAL_TABLET | Freq: Three times a day (TID) | ORAL | 0 refills | Status: AC

## 2023-11-09 NOTE — Progress Notes (Signed)
11/08/2023 5:07 PM   Casey Peters 02-18-1946 [id number]  CC: Chief Complaint  Patient presents with   Dysuria   HPI: Casey Peters is a 78 y.o. 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  He is accompanied today by his wife, who contributes to HPI.  I saw him in clinic most recently 17 days ago.  He had been treated for acute cystitis by urgent care with Levaquin x 7 days.  His symptoms improved, but had not resolved.  He also had concerns about a recent rise in his PSA.  I started him on empiric Cipro x 4 weeks for chronic bacterial prostatitis (he tolerates Cipro better than Levaquin) and ordered a prostate MRI.  Today he reports he remains on Cipro but his symptoms have not improved.  He is no longer having dysuria.  He describes constant low back and suprapubic pain.  Additionally, he has scheduled a follow-up appointment with Dr. Guy [facility]ifer at [facility] to discuss meatoplasty.  He typically CIC's with a 12 French catheter, but has been dilating his meatus at home up to 29 [place] size with some difficulty.  He admits to some weak stream today.  In-office UA today positive for 2+ ketones and trace leukocytes; urine microscopy with granular casts.  PVR 64 mL.  PMH: Past Medical History:  Diagnosis Date   Cardiomegaly    CHF (congestive heart failure) (HCC)    Constipation    Diabetic polyneuropathy (HCC)    Dilated idiopathic cardiomyopathy (HCC)    Erectile dysfunction    HLD (hyperlipidemia)    HTN (hypertension)    Low testosterone    Palpitations    Pneumonia    T2DM (type 2 diabetes mellitus) (HCC)     Surgical History: Past Surgical History:  Procedure Laterality Date   CORONARY/GRAFT ACUTE MI REVASCULARIZATION N/A 12/27/2021    Procedure: Coronary/Graft Acute MI Revascularization;  Surgeon: Yvonne Kendall, MD;  Location: [facility];  Service: Cardiovascular;  Laterality: N/A;   CYSTOSCOPY WITH URETHRAL DILATATION N/A 06/17/2021   Procedure: CYSTOSCOPY WITH URETHRAL DILATATION;  Surgeon: [name], MD;  Location: [facility];  Service: Urology;  Laterality: N/A;  Optilume Balloon   HERNIA REPAIR  1998   LEFT HEART CATH AND CORONARY ANGIOGRAPHY N/A 12/27/2021   Procedure: LEFT HEART CATH AND CORONARY ANGIOGRAPHY;  Surgeon: Yvonne Kendall, MD;  Location: [facility];  Service: Cardiovascular;  Laterality: N/A;   PILONIDAL CYST EXCISION     TONSILLECTOMY     URETHRA SURGERY  1995    Home Medications:  Allergies as of 11/08/2023       Reactions   Sulfamethoxazole-trimethoprim    Fatigue and muscle ache Other reaction(s): Other (See Comments) Fatigue and muscle ache   Trimethoprim Other (See Comments)   Fatigue, hypotension        Medication List        Accurate as of November 08, 2023 11:59 PM. If you have any questions, ask your nurse or doctor.          STOP taking these medications    ciprofloxacin 500 MG tablet Commonly known as: Cipro       TAKE these medications    amoxicillin 500 MG tablet Commonly known as: AMOXIL Take 2 tablets (1,000 mg total) by mouth every  8 (eight) hours.   ascorbic acid 500 MG tablet Commonly known as: VITAMIN C Take 500 mg by mouth daily.   aspirin EC 81 MG tablet Take 81 mg by mouth daily.   carvedilol 3.125 MG tablet Commonly known as: COREG Take 3.125 mg by mouth 2 (two) times daily.   cholecalciferol 25 MCG (1000 UNIT) tablet Commonly known as: VITAMIN D3 Take 1,000 Units by mouth daily.   metFORMIN 500 MG tablet Commonly known as: GLUCOPHAGE Take 1 tablet by mouth 2 (two) times daily with a meal.   omega-3 acid ethyl esters 1 g capsule Commonly known as: LOVAZA Take 2 capsules (2 g total) by mouth 2 (two) times daily  with a meal.   ONE TOUCH ULTRA TEST test strip Generic drug: glucose blood USE 1 STRIP 3 TIMES A DAY TO CHECK BLOOD GLUCOSE LEVELS   rosuvastatin 5 MG tablet Commonly known as: CRESTOR Take 1 tablet (5 mg total) by mouth daily.   silodosin 8 MG Caps capsule Commonly known as: RAPAFLO Take 1 capsule (8 mg total) by mouth daily with breakfast.   spironolactone 25 MG tablet Commonly known as: ALDACTONE Take 25 mg by mouth daily.   testosterone 50 MG/5GM (1%) Gel Commonly known as: ANDROGEL Place 5 g onto the skin daily. Please confirm with your cardiologist before resuming   Zinc Gluconate 30 MG Tabs Take 30 mg by mouth daily.        Allergies:  Allergies  Allergen Reactions   Sulfamethoxazole-Trimethoprim     Fatigue and muscle ache Other reaction(s): Other (See Comments) Fatigue and muscle ache   Trimethoprim Other (See Comments)    Fatigue, hypotension    Family History: Family History  Problem Relation Age of Onset   Hematuria Father    Kidney disease Neg Hx    Prostate cancer Neg Hx     Social History:   reports that he has been smoking cigars. He has never used smokeless tobacco. He reports that he does not drink alcohol and does not use drugs.  Physical Exam: BP 118/71   Pulse 78   Ht 5\' 11"  (1.803 m)   Wt 208 lb (94.3 kg)   BMI 29.01 kg/m   Constitutional:  Alert and oriented, no acute distress, nontoxic appearing HEENT: [place], AT Cardiovascular: No clubbing, cyanosis, or edema Respiratory: Normal respiratory effort, no increased work of breathing Skin: No rashes, bruises or suspicious lesions Neurologic: Grossly intact, no focal deficits, moving all 4 extremities Psychiatric: Normal mood and affect  Laboratory Data: Results for orders placed or performed in visit on 11/08/23  Microscopic Examination   Collection Time: 11/08/23 11:16 AM   Urine  Result Value Ref Range   WBC, UA 0-5 0 - 5 /hpf   RBC, Urine 0-2 0 - 2 /hpf   Epithelial Cells  (non renal) 0-10 0 - 10 /hpf   Casts Present (A) None seen /lpf   Cast Type Granular casts (A) N/A   Crystals Present (A) N/A   Crystal Type Amorphous Sediment N/A   Mucus, UA Present (A) Not Estab.   Bacteria, UA Few None seen/Few  Urinalysis, Complete   Collection Time: 11/08/23 11:16 AM  Result Value Ref Range   Specific Gravity, UA >1.030 (H) 1.005 - 1.030   pH, UA 5.0 5.0 - 7.5   Color, UA Yellow Yellow   Appearance Ur Clear Clear   Leukocytes,UA Trace (A) Negative   Protein,UA Negative Negative/Trace   Glucose, UA Negative Negative  Ketones, UA 2+ (A) Negative   RBC, UA Negative Negative   Bilirubin, UA Negative Negative   Urobilinogen, Ur 0.2 0.2 - 1.0 mg/dL   Nitrite, UA Negative Negative   Microscopic Examination See below:   Bladder Scan (Post Void Residual) in office   Collection Time: 11/08/23 11:25 AM  Result Value Ref Range   Scan Result 64 ml    Assessment & Plan:   1. Chronic prostatitis (Primary) No symptomatic improvement on Cipro.  He previously failed Flomax, however I am going to start him on silodosin to see if it gives him greater relief due to increased prostate specificity.  I am also switching his antibiotic to amoxicillin and will try to move up his prostate MRI. - Urinalysis, Complete - Microscopic Examination - silodosin (RAPAFLO) 8 MG CAPS capsule; Take 1 capsule (8 mg total) by mouth daily with breakfast.  Dispense: 30 capsule; Refill: 11 - amoxicillin (AMOXIL) 500 MG tablet; Take 2 tablets (1,000 mg total) by mouth every 8 (eight) hours.  Dispense: 252 tablet; Refill: 0  2. Weak urinary stream Suspect secondary to meatal stenosis, agree with continued CIC and follow-up with Dr. Guy Sandifer.  I do not think that this procedure will address his chronic low back and suprapubic pain.  I told them this. - Bladder Scan (Post Void Residual) in office  Return for Will call with results.  Carman Ching, PA-C  [facility] [place] [address], [address] [place], [place] [address] [phone]

## 2023-11-11 ENCOUNTER — Telehealth: Payer: Self-pay

## 2023-11-11 NOTE — Telephone Encounter (Signed)
Prostate MRI Prep:  1- No ejaculation 48 hours prior to exam  2- No caffeine or carbonated beverages on day of the exam  3- Eat light diet evening prior and day of exam  4- Avoid eating 4 hours prior to exam  5- Fleets enema needs to be done 4 hours prior to exam -See below. Can be purchased at the drug store.

## 2023-11-11 NOTE — Telephone Encounter (Signed)
Called pt to give Prostate MRI prep instructions. Pt voiced understanding.

## 2023-11-12 ENCOUNTER — Other Ambulatory Visit: Payer: Self-pay | Admitting: Physician Assistant

## 2023-11-12 DIAGNOSIS — N390 Urinary tract infection, site not specified: Secondary | ICD-10-CM

## 2023-11-16 ENCOUNTER — Other Ambulatory Visit: Payer: Self-pay | Admitting: Physician Assistant

## 2023-11-16 DIAGNOSIS — N390 Urinary tract infection, site not specified: Secondary | ICD-10-CM

## 2023-11-17 ENCOUNTER — Ambulatory Visit: Admission: RE | Admit: 2023-11-17 | Payer: Medicare PPO | Source: Ambulatory Visit

## 2023-11-18 ENCOUNTER — Other Ambulatory Visit: Payer: Self-pay | Admitting: Urology

## 2023-11-24 ENCOUNTER — Ambulatory Visit: Payer: Medicare PPO

## 2023-11-26 ENCOUNTER — Ambulatory Visit: Payer: Self-pay | Admitting: Physician Assistant

## 2023-12-01 ENCOUNTER — Ambulatory Visit
Admission: RE | Admit: 2023-12-01 | Discharge: 2023-12-01 | Disposition: A | Discharge status: Home / Self Care | Payer: Medicare PPO | Source: Ambulatory Visit | Attending: Physician Assistant | Admitting: Physician Assistant

## 2023-12-01 DIAGNOSIS — R972 Elevated prostate specific antigen [PSA]: Secondary | ICD-10-CM | POA: Diagnosis present

## 2023-12-01 DIAGNOSIS — N411 Chronic prostatitis: Secondary | ICD-10-CM | POA: Diagnosis present

## 2023-12-01 MED ORDER — GADOBUTROL 1 MMOL/ML IV SOLN
10.0000 mL | Freq: Once | INTRAVENOUS | Status: AC | PRN
Start: 2023-12-01 — End: 2023-12-01
  Administered 2023-12-01: 10 mL via INTRAVENOUS

## 2023-12-03 ENCOUNTER — Other Ambulatory Visit: Payer: Medicare PPO

## 2023-12-10 ENCOUNTER — Encounter: Payer: Self-pay | Admitting: Physician Assistant

## 2023-12-10 ENCOUNTER — Ambulatory Visit: Payer: Medicare PPO | Admitting: Physician Assistant

## 2023-12-10 VITALS — BP 115/78 | HR 90 | Ht 71.0 in | Wt 208.0 lb

## 2023-12-10 DIAGNOSIS — N411 Chronic prostatitis: Secondary | ICD-10-CM

## 2023-12-10 MED ORDER — SILODOSIN 8 MG PO CAPS: 8.0000 mg | ORAL_CAPSULE | Freq: Every day | ORAL | 3 refills | Status: AC

## 2023-12-10 NOTE — Progress Notes (Signed)
 12/10/2023 4:56 PM   Casey Peters [birth year] [id number]  CC: Chief Complaint  Patient presents with   Recurrent UTI   HPI: Casey Peters is a 78 y.o. 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  He is accompanied today by his wife, Casey Peters, who contributes to HPI.  Today he reports some improvement in his pain and voiding symptoms after being switched from Flomax to silodosin and switching antibiotics from Cipro to amoxicillin.  Notably, he is taking 1000 mg of amoxicillin twice daily per pharmacy instructions.  The pharmacy dispensed a 41-month supply.  I had prescribed 1000 mg every 8 hours for 6 weeks.  He had scheduled an appointment with Dr. Majel Homer to reconsider procedural intervention for his meatal stenosis, but decided to cancel this in light of the above.  MR prostate dated 12/01/2023 with no suspicious lesions.  There is diffuse change in the peripheral zone consistent with inflammation/post inflammation and a small enlarged superior rectal lymph node, decreased in size compared to prior.  PMH: Past Medical History:  Diagnosis Date   Cardiomegaly    CHF (congestive heart failure) (HCC)    Constipation    Diabetic polyneuropathy (HCC)    Dilated idiopathic cardiomyopathy (HCC)    Erectile dysfunction    HLD (hyperlipidemia)    HTN (hypertension)    Low testosterone    Palpitations    Pneumonia    T2DM (type 2 diabetes mellitus) (HCC)     Surgical History: Past Surgical History:  Procedure Laterality Date   CORONARY/GRAFT ACUTE MI REVASCULARIZATION N/A 12/27/2021   Procedure: Coronary/Graft Acute MI Revascularization;  Surgeon: Yvonne Kendall, MD;  Location: [facility];  Service: Cardiovascular;  Laterality: N/A;    CYSTOSCOPY WITH URETHRAL DILATATION N/A 06/17/2021   Procedure: CYSTOSCOPY WITH URETHRAL DILATATION;  Surgeon: [name], MD;  Location: [facility];  Service: Urology;  Laterality: N/A;  Optilume Balloon   HERNIA REPAIR  1998   LEFT HEART CATH AND CORONARY ANGIOGRAPHY N/A 12/27/2021   Procedure: LEFT HEART CATH AND CORONARY ANGIOGRAPHY;  Surgeon: Yvonne Kendall, MD;  Location: [facility];  Service: Cardiovascular;  Laterality: N/A;   PILONIDAL CYST EXCISION     TONSILLECTOMY     URETHRA SURGERY  1995    Home Medications:  Allergies as of 12/10/2023       Reactions   Sulfamethoxazole-trimethoprim    Fatigue and muscle ache Other reaction(s): Other (See Comments) Fatigue and muscle ache   Trimethoprim Other (See Comments)   Fatigue, hypotension        Medication List        Accurate as of December 10, 2023  4:56 PM. If you have any questions, ask your nurse or doctor.          amoxicillin 500 MG tablet Commonly known as: AMOXIL Take 2 tablets (1,000 mg total) by mouth every 8 (eight) hours.   ascorbic acid 500 MG tablet Commonly known as: VITAMIN C Take 500 mg by mouth daily.   aspirin EC 81 MG tablet Take 81 mg by mouth daily.   carvedilol 3.125 MG tablet Commonly known as: COREG Take 3.125 mg by mouth 2 (two) times daily.   cholecalciferol 25 MCG (1000 UNIT) tablet Commonly known as: VITAMIN D3 Take 1,000 Units by mouth daily.  metFORMIN 1000 MG tablet Commonly known as: GLUCOPHAGE Take 1,000 mg by mouth 2 (two) times daily. What changed: Another medication with the same name was removed. Continue taking this medication, and follow the directions you see here. Changed by: Carman Ching   nitrofurantoin (macrocrystal-monohydrate) 100 MG capsule Commonly known as: MACROBID TAKE 1 CAPSULE BY MOUTH ONCE DAILY   omega-3 acid ethyl esters 1 g capsule Commonly known as: LOVAZA Take 2 capsules (2 g total) by mouth 2 (two) times daily with  a meal.   ONE TOUCH ULTRA TEST test strip Generic drug: glucose blood USE 1 STRIP 3 TIMES A DAY TO CHECK BLOOD GLUCOSE LEVELS   rosuvastatin 5 MG tablet Commonly known as: CRESTOR Take 1 tablet (5 mg total) by mouth daily.   silodosin 8 MG Caps capsule Commonly known as: RAPAFLO Take 1 capsule (8 mg total) by mouth daily with breakfast.   spironolactone 25 MG tablet Commonly known as: ALDACTONE Take 25 mg by mouth daily.   testosterone 50 MG/5GM (1%) Gel Commonly known as: ANDROGEL Place 5 g onto the skin daily. Please confirm with your cardiologist before resuming   Zinc Gluconate 30 MG Tabs Take 30 mg by mouth daily.        Allergies:  Allergies  Allergen Reactions   Sulfamethoxazole-Trimethoprim     Fatigue and muscle ache Other reaction(s): Other (See Comments) Fatigue and muscle ache   Trimethoprim Other (See Comments)    Fatigue, hypotension    Family History: Family History  Problem Relation Age of Onset   Hematuria Father    Kidney disease Neg Hx    Prostate cancer Neg Hx     Social History:   reports that he has been smoking cigars. He has never used smokeless tobacco. He reports that he does not drink alcohol and does not use drugs.  Physical Exam: BP 115/78   Pulse 90   Ht 5\' 11"  (1.803 m)   Wt 208 lb (94.3 kg)   BMI 29.01 kg/m   Constitutional:  Alert and oriented, no acute distress, nontoxic appearing HEENT: [place], AT Cardiovascular: No clubbing, cyanosis, or edema Respiratory: Normal respiratory effort, no increased work of breathing Skin: No rashes, bruises or suspicious lesions Neurologic: Grossly intact, no focal deficits, moving all 4 extremities Psychiatric: Normal mood and affect  Assessment & Plan:   1. Chronic prostatitis Some improvement after switching to silodosin from Flomax.  Will have him increase amoxicillin to 1000 mg every 8 hours x 2 more weeks and then he can discontinue it.  We discussed rather reassuring prostate  MRI.  His enlarged rectal lymph node has been in place for several years and is diminished in size, suspect reactive in the setting of chronic prostatitis.  We discussed that I may not be able to completely resolve his pain and that this will be a chronic problem for him that may wax and wane in severity.  May consider second opinion in the future if he desires. - silodosin (RAPAFLO) 8 MG CAPS capsule; Take 1 capsule (8 mg total) by mouth daily with breakfast.  Dispense: 90 capsule; Refill: 3   Return in about 1 year (around 12/09/2024) for Annual IPSS, PVR.  Carman Ching, PA-C  [facility] [place] [address] [place], [place] [address] [phone]

## 2024-03-02 ENCOUNTER — Other Ambulatory Visit: Payer: Self-pay | Admitting: Internal Medicine

## 2024-03-02 DIAGNOSIS — R112 Nausea with vomiting, unspecified: Secondary | ICD-10-CM

## 2024-03-10 ENCOUNTER — Ambulatory Visit
Admission: RE | Admit: 2024-03-10 | Discharge: 2024-03-10 | Disposition: A | Discharge status: Home / Self Care | Source: Ambulatory Visit | Attending: Internal Medicine | Admitting: Internal Medicine

## 2024-03-10 DIAGNOSIS — R112 Nausea with vomiting, unspecified: Secondary | ICD-10-CM | POA: Insufficient documentation

## 2024-03-10 MED ORDER — IOHEXOL 300 MG/ML  SOLN
100.0000 mL | Freq: Once | INTRAMUSCULAR | Status: AC | PRN
  Administered 2024-03-10: 100 mL via INTRAVENOUS

## 2024-03-14 ENCOUNTER — Other Ambulatory Visit: Payer: Self-pay

## 2024-03-14 ENCOUNTER — Emergency Department

## 2024-03-14 ENCOUNTER — Encounter: Payer: Self-pay | Admitting: Emergency Medicine

## 2024-03-14 ENCOUNTER — Inpatient Hospital Stay
Admission: EM | Admit: 2024-03-14 | Discharge: 2024-03-17 | DRG: 445 | Disposition: A | Discharge status: Home / Self Care | Attending: Internal Medicine | Admitting: Internal Medicine

## 2024-03-14 DIAGNOSIS — Z7984 Long term (current) use of oral hypoglycemic drugs: Secondary | ICD-10-CM

## 2024-03-14 DIAGNOSIS — K5730 Diverticulosis of large intestine without perforation or abscess without bleeding: Secondary | ICD-10-CM | POA: Diagnosis present

## 2024-03-14 DIAGNOSIS — Z888 Allergy status to other drugs, medicaments and biological substances status: Secondary | ICD-10-CM

## 2024-03-14 DIAGNOSIS — K838 Other specified diseases of biliary tract: Secondary | ICD-10-CM | POA: Diagnosis not present

## 2024-03-14 DIAGNOSIS — I110 Hypertensive heart disease with heart failure: Secondary | ICD-10-CM | POA: Diagnosis present

## 2024-03-14 DIAGNOSIS — Z87440 Personal history of urinary (tract) infections: Secondary | ICD-10-CM

## 2024-03-14 DIAGNOSIS — I509 Heart failure, unspecified: Secondary | ICD-10-CM | POA: Diagnosis present

## 2024-03-14 DIAGNOSIS — E785 Hyperlipidemia, unspecified: Secondary | ICD-10-CM | POA: Diagnosis present

## 2024-03-14 DIAGNOSIS — E1142 Type 2 diabetes mellitus with diabetic polyneuropathy: Secondary | ICD-10-CM | POA: Diagnosis present

## 2024-03-14 DIAGNOSIS — R17 Unspecified jaundice: Secondary | ICD-10-CM | POA: Diagnosis present

## 2024-03-14 DIAGNOSIS — R748 Abnormal levels of other serum enzymes: Secondary | ICD-10-CM | POA: Diagnosis present

## 2024-03-14 DIAGNOSIS — I2510 Atherosclerotic heart disease of native coronary artery without angina pectoris: Secondary | ICD-10-CM | POA: Diagnosis present

## 2024-03-14 DIAGNOSIS — Z955 Presence of coronary angioplasty implant and graft: Secondary | ICD-10-CM | POA: Diagnosis not present

## 2024-03-14 DIAGNOSIS — Z882 Allergy status to sulfonamides status: Secondary | ICD-10-CM | POA: Diagnosis not present

## 2024-03-14 DIAGNOSIS — R634 Abnormal weight loss: Secondary | ICD-10-CM

## 2024-03-14 DIAGNOSIS — Z8701 Personal history of pneumonia (recurrent): Secondary | ICD-10-CM | POA: Diagnosis not present

## 2024-03-14 DIAGNOSIS — F17290 Nicotine dependence, other tobacco product, uncomplicated: Secondary | ICD-10-CM | POA: Diagnosis present

## 2024-03-14 DIAGNOSIS — K831 Obstruction of bile duct: Principal | ICD-10-CM | POA: Diagnosis present

## 2024-03-14 DIAGNOSIS — Z881 Allergy status to other antibiotic agents status: Secondary | ICD-10-CM | POA: Diagnosis not present

## 2024-03-14 DIAGNOSIS — I420 Dilated cardiomyopathy: Secondary | ICD-10-CM | POA: Diagnosis present

## 2024-03-14 DIAGNOSIS — Z7982 Long term (current) use of aspirin: Secondary | ICD-10-CM | POA: Diagnosis not present

## 2024-03-14 DIAGNOSIS — I700 Atherosclerosis of aorta: Secondary | ICD-10-CM | POA: Diagnosis present

## 2024-03-14 DIAGNOSIS — N400 Enlarged prostate without lower urinary tract symptoms: Secondary | ICD-10-CM | POA: Diagnosis present

## 2024-03-14 DIAGNOSIS — Z792 Long term (current) use of antibiotics: Secondary | ICD-10-CM | POA: Diagnosis not present

## 2024-03-14 DIAGNOSIS — Z79899 Other long term (current) drug therapy: Secondary | ICD-10-CM

## 2024-03-14 DIAGNOSIS — R7989 Other specified abnormal findings of blood chemistry: Principal | ICD-10-CM

## 2024-03-14 LAB — CBC
HCT: 46.7 % (ref 39.0–52.0)
Hemoglobin: 15.2 g/dL (ref 13.0–17.0)
MCH: 30.3 pg (ref 26.0–34.0)
MCHC: 32.5 g/dL (ref 30.0–36.0)
MCV: 93.2 fL (ref 80.0–100.0)
Platelets: 191 10*3/uL (ref 150–400)
RBC: 5.01 MIL/uL (ref 4.22–5.81)
RDW: 17.7 % — ABNORMAL HIGH (ref 11.5–15.5)
WBC: 9 10*3/uL (ref 4.0–10.5)
nRBC: 0 % (ref 0.0–0.2)

## 2024-03-14 LAB — COMPREHENSIVE METABOLIC PANEL WITH GFR
ALT: 520 U/L — ABNORMAL HIGH (ref 0–44)
AST: 208 U/L — ABNORMAL HIGH (ref 15–41)
Albumin: 3.1 g/dL — ABNORMAL LOW (ref 3.5–5.0)
Alkaline Phosphatase: 632 U/L — ABNORMAL HIGH (ref 38–126)
Anion gap: 13 (ref 5–15)
BUN: 23 mg/dL (ref 8–23)
CO2: 25 mmol/L (ref 22–32)
Calcium: 9.1 mg/dL (ref 8.9–10.3)
Chloride: 97 mmol/L — ABNORMAL LOW (ref 98–111)
Creatinine, Ser: 0.75 mg/dL (ref 0.61–1.24)
GFR, Estimated: >60 mL/min (ref >60)
Glucose, Bld: 146 mg/dL — ABNORMAL HIGH (ref 70–99)
Potassium: 3.8 mmol/L (ref 3.5–5.1)
Sodium: 135 mmol/L (ref 135–145)
Total Bilirubin: 17.1 mg/dL — ABNORMAL HIGH (ref 0.0–1.2)
Total Protein: 7.2 g/dL (ref 6.5–8.1)

## 2024-03-14 LAB — URINALYSIS, ROUTINE W REFLEX MICROSCOPIC
Glucose, UA: NEGATIVE mg/dL
Ketones, ur: NEGATIVE mg/dL
Nitrite: NEGATIVE
Protein, ur: NEGATIVE mg/dL
Specific Gravity, Urine: 1.02 (ref 1.005–1.030)
WBC, UA: >50 WBC/hpf (ref 0–5)
pH: 5 (ref 5.0–8.0)

## 2024-03-14 LAB — HEMOGLOBIN A1C
Hgb A1c MFr Bld: 6.9 % — ABNORMAL HIGH (ref 4.8–5.6)
Mean Plasma Glucose: 151.33 mg/dL

## 2024-03-14 LAB — LIPASE, BLOOD: Lipase: 25 U/L (ref 11–51)

## 2024-03-14 LAB — GLUCOSE, CAPILLARY
Glucose-Capillary: 128 mg/dL — ABNORMAL HIGH (ref 70–99)
Glucose-Capillary: 86 mg/dL (ref 70–99)

## 2024-03-14 MED ORDER — TESTOSTERONE 50 MG/5GM (1%) TD GEL: 5.0000 g | Freq: Every day | TRANSDERMAL | Status: DC

## 2024-03-14 MED ORDER — ONDANSETRON HCL 4 MG/2ML IJ SOLN
4.0000 mg | Freq: Four times a day (QID) | INTRAMUSCULAR | Status: DC | PRN
Start: 2024-03-14 — End: 2024-03-17
  Administered 2024-03-16: 4 mg via INTRAVENOUS
  Filled 2024-03-14: qty 2

## 2024-03-14 MED ORDER — ENOXAPARIN SODIUM 60 MG/0.6ML IJ SOSY: 45.0000 mg | PREFILLED_SYRINGE | INTRAMUSCULAR | Status: DC

## 2024-03-14 MED ORDER — TAMSULOSIN HCL 0.4 MG PO CAPS
0.4000 mg | ORAL_CAPSULE | Freq: Every day | ORAL | Status: DC
  Administered 2024-03-15: 0.4 mg via ORAL
  Filled 2024-03-14: qty 1

## 2024-03-14 MED ORDER — NITROFURANTOIN MONOHYD MACRO 100 MG PO CAPS
100.0000 mg | ORAL_CAPSULE | Freq: Every day | ORAL | Status: DC
  Administered 2024-03-15 – 2024-03-17 (×3): 100 mg via ORAL
  Filled 2024-03-14 (×3): qty 1

## 2024-03-14 MED ORDER — ASPIRIN 81 MG PO TBEC
81.0000 mg | DELAYED_RELEASE_TABLET | Freq: Every evening | ORAL | Status: DC
  Administered 2024-03-14 – 2024-03-16 (×3): 81 mg via ORAL
  Filled 2024-03-14 (×3): qty 1

## 2024-03-14 MED ORDER — ENOXAPARIN SODIUM 40 MG/0.4ML IJ SOSY: 40.0000 mg | PREFILLED_SYRINGE | INTRAMUSCULAR | Status: DC

## 2024-03-14 MED ORDER — INSULIN ASPART 100 UNIT/ML IJ SOLN
0.0000 [IU] | Freq: Three times a day (TID) | INTRAMUSCULAR | Status: DC
  Administered 2024-03-14 – 2024-03-15 (×2): 2 [IU] via SUBCUTANEOUS
  Administered 2024-03-16: 5 [IU] via SUBCUTANEOUS
  Administered 2024-03-16 – 2024-03-17 (×4): 3 [IU] via SUBCUTANEOUS
  Filled 2024-03-14 (×7): qty 1

## 2024-03-14 MED ORDER — METFORMIN HCL 500 MG PO TABS: 1000.0000 mg | ORAL_TABLET | Freq: Two times a day (BID) | ORAL | Status: DC

## 2024-03-14 MED ORDER — LINAGLIPTIN 5 MG PO TABS
5.0000 mg | ORAL_TABLET | Freq: Every evening | ORAL | Status: DC
  Administered 2024-03-14 – 2024-03-16 (×3): 5 mg via ORAL
  Filled 2024-03-14 (×4): qty 1

## 2024-03-14 MED ORDER — ONDANSETRON HCL 4 MG PO TABS: 4.0000 mg | ORAL_TABLET | Freq: Four times a day (QID) | ORAL | Status: DC | PRN

## 2024-03-14 MED ORDER — GADOBUTROL 1 MMOL/ML IV SOLN
9.0000 mL | Freq: Once | INTRAVENOUS | Status: AC | PRN
  Administered 2024-03-14: 10 mL via INTRAVENOUS

## 2024-03-14 NOTE — ED Triage Notes (Signed)
 Patient to ED via POV sent by PCP for abnormal labs. AST 195, ALT 495, and Bilirubin 16.1. PT reports CT scan done Friday showed something wrong with the bile duct. Reports generalized weakness/fatigue over the past couple of months.

## 2024-03-14 NOTE — Progress Notes (Signed)
 PHARMACIST - PHYSICIAN COMMUNICATION  CONCERNING:  Enoxaparin  (Lovenox ) for DVT Prophylaxis    RECOMMENDATION: Patient was prescribed enoxaprin 40mg  q24 hours for VTE prophylaxis.   Filed Weights   03/14/24 1154  Weight: 90.7 kg (200 lb)    Body mass index is 27.89 kg/m.  Estimated Creatinine Clearance: 90.6 mL/min (by C-G formula based on SCr of 0.75 mg/dL).   Based on [facility] policy patient is candidate for enoxaparin  0.5mg /kg TBW SQ every 24 hours based on BMI being >30.  DESCRIPTION: Pharmacy has adjusted enoxaparin  dose per [facility] policy.  Patient is now receiving enoxaparin  45 mg every 24 hours    Malone Sear, PharmD, BCPS Clinical Pharmacist 03/14/2024 2:18 PM

## 2024-03-14 NOTE — Plan of Care (Signed)

## 2024-03-14 NOTE — ED Notes (Signed)
Pt transported off floor for MRI

## 2024-03-14 NOTE — H&P (Signed)
 History and Physical    Casey Peters NFA:[id number] DOB: 06-Jul-1946 DOA: 03/14/2024  PCP: Little Riff, MD (Confirm with patient/family/NH records and if not entered, this has to be entered at [facility] point of entry) Patient coming from: Home  I have personally briefly reviewed patient's old medical records in [facility]  Chief Complaint: Jaundice, weight loss  HPI: Casey Peters is a 78 y.o. male with medical history significant of dilated cardiomyopathy, CAD RCA stent in 2023, HTN, HLD, IIDM, BPH with history of recurrent UTIs on suppressive antibiotics, presented with painless jaundice.  Symptoms started 2 to 3 weeks ago, patient started to have a lower abdominal pain, poorly located, dull, episodic, very random, appears to have no relations with taking meals or drinking waters.  Also has had nocturnal pain.  Usually does not take any pain medication and just let the episode pass by itself.  In time he also noticed light-colored stool and skin color has turned darker especially on abdominal wall and chest wall.  No fever chills no nausea vomiting or diarrhea.  He lost about 8 pounds compared to last month.  2 weeks ago, he went to see PCP who ordered blood work which showed elevated LFTs and a outpatient CT scan done which showed dilated CBD and patient sent to ER for further evaluation.  ED Course: Afebrile, nontachycardic nonhypotensive nonhypoxic.  Blood work showed AST 208, ALT 520, alkaline phosphatase 632, bilirubin 17.  CT abdomen pelvis showed intrahepatic CBD dilation.  MRCP showed moderate intrahepatic bile duct dilation with abrupt tapering at the level of the hepatic hilum, benign stricture versus cholangiocarcinoma.  Review of Systems: As per HPI otherwise 14 point review of systems negative.    Past Medical History:  Diagnosis Date   Cardiomegaly    CHF (congestive heart failure) (HCC)    Constipation    Diabetic polyneuropathy (HCC)    Dilated  idiopathic cardiomyopathy (HCC)    Erectile dysfunction    HLD (hyperlipidemia)    HTN (hypertension)    Low testosterone     Palpitations    Pneumonia    T2DM (type 2 diabetes mellitus) (HCC)     Past Surgical History:  Procedure Laterality Date   CORONARY/GRAFT ACUTE MI REVASCULARIZATION N/A 12/27/2021   Procedure: Coronary/Graft Acute MI Revascularization;  Surgeon: Sammy Crisp, MD;  Location: [facility];  Service: Cardiovascular;  Laterality: N/A;   CYSTOSCOPY WITH URETHRAL DILATATION N/A 06/17/2021   Procedure: CYSTOSCOPY WITH URETHRAL DILATATION;  Surgeon: Geraline Knapp, MD;  Location: [facility] OR[facility];  Service: Urology;  Laterality: N/A;  Optilume Balloon   HERNIA REPAIR  1998   LEFT HEART CATH AND CORONARY ANGIOGRAPHY N/A 12/27/2021   Procedure: LEFT HEART CATH AND CORONARY ANGIOGRAPHY;  Surgeon: Sammy Crisp, MD;  Location: [facility];  Service: Cardiovascular;  Laterality: N/A;   PILONIDAL CYST EXCISION     TONSILLECTOMY     URETHRA SURGERY  1995     reports that he has been smoking cigars. He has never used smokeless tobacco. He reports that he does not drink alcohol and does not use drugs.  Allergies  Allergen Reactions   Citalopram Nausea And Vomiting    Dizziness, unsteady, balanced issues   Sulfamethoxazole -Trimethoprim      Fatigue and muscle ache Other reaction(s): Other (See Comments) Fatigue and muscle ache   Trimethoprim  Other (See Comments)    Fatigue, hypotension    Family History  Problem Relation Age of Onset   Hematuria Father  Kidney disease Neg Hx    Prostate cancer Neg Hx      Prior to Admission medications   Medication Sig Start Date End Date Taking? Authorizing Provider  ascorbic acid (VITAMIN C) 500 MG tablet Take 500 mg by mouth daily.   Yes [provider]  aspirin  EC 81 MG tablet Take 81 mg by mouth daily.   Yes [provider]  cholecalciferol (VITAMIN D3) 25 MCG (1000 UNIT) tablet Take 1,000  Units by mouth daily.   Yes [provider]  furosemide (LASIX) 40 MG tablet Take 40 mg by mouth daily.   Yes [provider]  glimepiride (AMARYL) 1 MG tablet Take 2 mg by mouth daily with breakfast. 03/03/24 03/03/25 Yes [provider]  JANUVIA 50 MG tablet Take 1 tablet by mouth daily. 02/29/24 02/28/25 Yes [provider]  nitrofurantoin , macrocrystal-monohydrate, (MACROBID ) 100 MG capsule TAKE 1 CAPSULE BY MOUTH ONCE DAILY 11/18/23  Yes Vaillancourt, Samantha, PA-C  rosuvastatin  (CRESTOR ) 5 MG tablet Take 1 tablet (5 mg total) by mouth daily. 12/30/21  Yes Luna Salinas, MD  silodosin  (RAPAFLO ) 8 MG CAPS capsule Take 1 capsule (8 mg total) by mouth daily with breakfast. 12/10/23  Yes Vaillancourt, Samantha, PA-C  spironolactone  (ALDACTONE ) 25 MG tablet Take 12.5 mg by mouth daily. 01/04/24  Yes [provider]  testosterone  (ANDROGEL ) 50 MG/5GM (1%) GEL Place 5 g onto the skin daily. Please confirm with your cardiologist before resuming 12/29/21  Yes Amin, Sumayya, MD  Zinc Gluconate 30 MG TABS Take 30 mg by mouth daily.   Yes [provider]  ciprofloxacin  (CIPRO ) 250 MG tablet Take 250 mg by mouth 2 (two) times daily. Patient not taking: Reported on 03/14/2024 02/24/24   [provider]  citalopram (CELEXA) 20 MG tablet Take 20 mg by mouth daily. Patient not taking: Reported on 03/14/2024 02/03/24   [provider]  metFORMIN (GLUCOPHAGE) 1000 MG tablet Take 1,000 mg by mouth 2 (two) times daily. Patient not taking: Reported on 03/14/2024 11/30/23   [provider]  omega-3 acid ethyl esters (LOVAZA ) 1 g capsule Take 2 capsules (2 g total) by mouth 2 (two) times daily with a meal. Patient not taking: Reported on 03/14/2024 12/29/21   Luna Salinas, MD  ONE TOUCH ULTRA TEST test strip USE 1 STRIP 3 TIMES A DAY TO CHECK BLOOD GLUCOSE LEVELS 09/24/18   [provider]  predniSONE (DELTASONE) 20 MG tablet Take 20 mg by mouth  daily with breakfast. Patient not taking: Reported on 03/14/2024 02/29/24   [provider]    Physical Exam: Vitals:   03/14/24 1230 03/14/24 1300 03/14/24 1455 03/14/24 1602  BP: 124/70 114/68 130/77 (!) 148/80  Pulse: 74 76 73 69  Resp: (!) 22 (!) 22 18 18   Temp:   98.8 F (37.1 C) 98.8 F (37.1 C)  TempSrc:   Oral Oral  SpO2: 95% 97% 97% 99%  Weight:      Height:        Constitutional: NAD, calm, comfortable Vitals:   03/14/24 1230 03/14/24 1300 03/14/24 1455 03/14/24 1602  BP: 124/70 114/68 130/77 (!) 148/80  Pulse: 74 76 73 69  Resp: (!) 22 (!) 22 18 18   Temp:   98.8 F (37.1 C) 98.8 F (37.1 C)  TempSrc:   Oral Oral  SpO2: 95% 97% 97% 99%  Weight:      Height:       Eyes: PERRL, lids and conjunctivae normal, positive for jaundice ENMT:  Mucous membranes are moist. Posterior pharynx clear of any exudate or lesions.Normal dentition.  Neck: normal, supple, no masses, no thyromegaly Respiratory: clear to auscultation bilaterally, no wheezing, no crackles. Normal respiratory effort. No accessory muscle use.  Cardiovascular: Regular rate and rhythm, no murmurs / rubs / gallops. No extremity edema. 2+ pedal pulses. No carotid bruits.  Abdomen: no tenderness, no masses palpated. No hepatosplenomegaly. Bowel sounds positive.  Musculoskeletal: no clubbing / cyanosis. No joint deformity upper and lower extremities. Good ROM, no contractures. Normal muscle tone.  Skin: no rashes, lesions, ulcers. No induration Neurologic: CN 2-12 grossly intact. Sensation intact, DTR normal. Strength 5/5 in all 4.  Psychiatric: Normal judgment and insight. Alert and oriented x 3. Normal mood.     Labs on Admission: I have personally reviewed following labs and imaging studies  CBC: Recent Labs  Lab 03/14/24 1159  WBC 9.0  HGB 15.2  HCT 46.7  MCV 93.2  PLT 191   Basic Metabolic Panel: Recent Labs  Lab 03/14/24 1159  NA 135  K 3.8  CL 97*  CO2 25  GLUCOSE 146*  BUN 23   CREATININE 0.75  CALCIUM  9.1   GFR: Estimated Creatinine Clearance: 90.6 mL/min (by C-G formula based on SCr of 0.75 mg/dL). Liver Function Tests: Recent Labs  Lab 03/14/24 1159  AST 208*  ALT 520*  ALKPHOS 632*  BILITOT 17.1*  PROT 7.2  ALBUMIN 3.1*   Recent Labs  Lab 03/14/24 1159  LIPASE 25   No results for input(s): "AMMONIA" in the last 168 hours. Coagulation Profile: No results for input(s): "INR", "PROTIME" in the last 168 hours. Cardiac Enzymes: No results for input(s): "CKTOTAL", "CKMB", "CKMBINDEX", "TROPONINI" in the last 168 hours. BNP (last 3 results) No results for input(s): "PROBNP" in the last 8760 hours. HbA1C: No results for input(s): "HGBA1C" in the last 72 hours. CBG: No results for input(s): "GLUCAP" in the last 168 hours. Lipid Profile: No results for input(s): "CHOL", "HDL", "LDLCALC", "TRIG", "CHOLHDL", "LDLDIRECT" in the last 72 hours. Thyroid Function Tests: No results for input(s): "TSH", "T4TOTAL", "FREET4", "T3FREE", "THYROIDAB" in the last 72 hours. Anemia Panel: No results for input(s): "VITAMINB12", "FOLATE", "FERRITIN", "TIBC", "IRON", "RETICCTPCT" in the last 72 hours. Urine analysis:    Component Value Date/Time   APPEARANCEUR Clear 11/08/2023 1116   GLUCOSEU Negative 11/08/2023 1116   BILIRUBINUR Negative 11/08/2023 1116   KETONESUR negative 09/17/2023 1505   PROTEINUR Negative 11/08/2023 1116   UROBILINOGEN 0.2 09/17/2023 1505   NITRITE Negative 11/08/2023 1116   LEUKOCYTESUR Trace (A) 11/08/2023 1116    Radiological Exams on Admission: MR ABDOMEN MRCP W WO CONTAST Result Date: 03/14/2024 CLINICAL DATA:  Further evaluation of intrahepatic bile duct dilation. Nausea. EXAM: MRI ABDOMEN WITHOUT AND WITH CONTRAST (INCLUDING MRCP) TECHNIQUE: Multiplanar multisequence MR imaging of the abdomen was performed both before and after the administration of intravenous contrast. Heavily T2-weighted images of the biliary and pancreatic  ducts were obtained. Post-processing was applied at the acquisition scanner with concurrent physician supervision which includes 3D reconstructions, MIPs, volume rendered images and/or shaded surface rendering. CONTRAST:  10mL GADAVIST  GADOBUTROL  1 MMOL/ML IV SOLN COMPARISON:  CT abdomen and pelvis dated 03/10/2024 FINDINGS: Lower chest: No acute findings. Hepatobiliary: Atrophic left hepatic lobe. Moderate intrahepatic bile duct dilation with abrupt tapering at the level of the hepatic hilum (14:39). The common bile duct is nondilated, also demonstrating tapering at the hepatic hilum. No discrete filling defect, obstructing lesion, or signal abnormality. Underdistended gallbladder. Pancreas: No mass,  inflammatory changes, or other parenchymal abnormality identified. Spleen: 2.2 x 1.8 cm mildly T2 hyperintense, enhancing focus along the medial posterior spleen (9:15) is subtly seen in retrospect on prior CT examinations and only minimally increased in size, likely benign such as a venous malformation or hamartoma. Adrenals/Urinary Tract: No adrenal nodules. No suspicious renal masses identified. No evidence of hydronephrosis. Bilateral simple cysts. No specific follow-up imaging recommended. Stomach/Bowel: Colonic diverticulosis without acute diverticulitis. Vascular/Lymphatic: No pathologically enlarged lymph nodes identified. No abdominal aortic aneurysm demonstrated. Aortic atherosclerosis. Other:  None. Musculoskeletal: No suspicious bone lesions identified. IMPRESSION: 1. Moderate intrahepatic bile duct dilation with abrupt tapering at the level of the hepatic hilum. No discrete filling defect, obstructing lesion, or signal abnormality. Findings may be secondary to benign stricture or cholangiocarcinoma. Recommend consultation to gastroenterology. 2. Colonic diverticulosis without acute diverticulitis. 3. A 2.2 cm mildly T2 hyperintense, enhancing focus along the medial posterior spleen is subtly seen in  retrospect on prior CT examinations and only minimally increased in size, likely benign such as a venous malformation or hamartoma. 4. Aortic Atherosclerosis (ICD10-I70.0). Electronically Signed   By: Limin  Xu M.D.   On: 03/14/2024 15:45   MR 3D Recon At Scanner Result Date: 03/14/2024 CLINICAL DATA:  Further evaluation of intrahepatic bile duct dilation. Nausea. EXAM: MRI ABDOMEN WITHOUT AND WITH CONTRAST (INCLUDING MRCP) TECHNIQUE: Multiplanar multisequence MR imaging of the abdomen was performed both before and after the administration of intravenous contrast. Heavily T2-weighted images of the biliary and pancreatic ducts were obtained. Post-processing was applied at the acquisition scanner with concurrent physician supervision which includes 3D reconstructions, MIPs, volume rendered images and/or shaded surface rendering. CONTRAST:  10mL GADAVIST  GADOBUTROL  1 MMOL/ML IV SOLN COMPARISON:  CT abdomen and pelvis dated 03/10/2024 FINDINGS: Lower chest: No acute findings. Hepatobiliary: Atrophic left hepatic lobe. Moderate intrahepatic bile duct dilation with abrupt tapering at the level of the hepatic hilum (14:39). The common bile duct is nondilated, also demonstrating tapering at the hepatic hilum. No discrete filling defect, obstructing lesion, or signal abnormality. Underdistended gallbladder. Pancreas: No mass, inflammatory changes, or other parenchymal abnormality identified. Spleen: 2.2 x 1.8 cm mildly T2 hyperintense, enhancing focus along the medial posterior spleen (9:15) is subtly seen in retrospect on prior CT examinations and only minimally increased in size, likely benign such as a venous malformation or hamartoma. Adrenals/Urinary Tract: No adrenal nodules. No suspicious renal masses identified. No evidence of hydronephrosis. Bilateral simple cysts. No specific follow-up imaging recommended. Stomach/Bowel: Colonic diverticulosis without acute diverticulitis. Vascular/Lymphatic: No pathologically  enlarged lymph nodes identified. No abdominal aortic aneurysm demonstrated. Aortic atherosclerosis. Other:  None. Musculoskeletal: No suspicious bone lesions identified. IMPRESSION: 1. Moderate intrahepatic bile duct dilation with abrupt tapering at the level of the hepatic hilum. No discrete filling defect, obstructing lesion, or signal abnormality. Findings may be secondary to benign stricture or cholangiocarcinoma. Recommend consultation to gastroenterology. 2. Colonic diverticulosis without acute diverticulitis. 3. A 2.2 cm mildly T2 hyperintense, enhancing focus along the medial posterior spleen is subtly seen in retrospect on prior CT examinations and only minimally increased in size, likely benign such as a venous malformation or hamartoma. 4. Aortic Atherosclerosis (ICD10-I70.0). Electronically Signed   By: Limin  Xu M.D.   On: 03/14/2024 15:45    EKG: Independently reviewed.  Sinus rhythm, no acute ST changes.  Assessment/Plan Principal Problem:   Painless jaundice  (please populate well all problems here in Problem List. (For example, if patient is on BP meds at home and you resume or decide  to hold them, it is a problem that needs to be her. Same for CAD, COPD, HLD and so on)  Painless jaundice - MRCP confirmed intrahepatic biliary obstruction - Rule out malignancy.  Consult GI for ERCP evaluation - Send tumor markers CA 19-9 and AFP - Hold off chemical DVT prophylaxis as per GI  IIDM -SSI  BPH Recurrent UTIs - No acute concern, continue Macrobid  prophylaxis - Continue Flomax   CAD s/p RCA stenting in 2023 - No acute concern, continue aspirin  - Hold off statin as LFTs elevated.  DVT prophylaxis: SCD Code Status: Full code Family Communication: Wife at bedside Disposition Plan: Patient is sick with pulmonary jaundice requiring inpatient GI workup for a newly found intrahepatic CBD obstruction.  Expect following 2 midnight hospital stay. Consults called: GI Admission status:  MedSurg admission   [place] MD [facility] Pager [phone]  03/14/2024, 4:30 PM

## 2024-03-14 NOTE — ED Provider Notes (Signed)
 [facility] Provider Note    Event Date/Time   First MD Initiated Contact with Patient 03/14/24 1209     (approximate)   History   Abnormal Lab   HPI  Casey Peters is a 78 y.o. male with history of CHF, hypertension, hyperlipidemia, presenting with elevated LFTs.  Per patient and wife, patient has been experiencing weeks of fatigue, has also had an unexpected 8 pound weight loss in last couple weeks, noticed dark urine without any dysuria or urinary frequency.  No fevers, has been having some nausea but no vomiting, no diarrhea, noticed that his stool has become [place] colored.  Also becoming more jaundiced.  Per wife they went to his primary care doctor who got LFTs that were elevated, had a CT scan that was done that showed ductal dilatation.  His primary care doctor sent him in to be admitted for further management.  He denies any abdominal pain.  No history of liver problems.  Independent history obtained from wife as above.   On independent chart review, he had a CT scan that was done on 30 May that showed mild intrahepatic biliary ductal dilatation predominantly affecting the left lobe.  He had LFTs done today at 10 AM that showed an AST of 195, ALT of 495, alk phos of 777 and a total bilirubin of 16.1.  These LFTs are increased compared to prior.  Physical Exam   Triage Vital Signs: ED Triage Vitals  Encounter Vitals Group     BP 03/14/24 1155 (P) 109/74     Systolic BP Percentile --      Diastolic BP Percentile --      Pulse Rate 03/14/24 1155 (P) 85     Resp 03/14/24 1155 (P) 18     Temp 03/14/24 1155 (P) 98.5 F (36.9 C)     Temp Source 03/14/24 1155 (P) Oral     SpO2 03/14/24 1155 (P) 98 %     Weight 03/14/24 1154 200 lb (90.7 kg)     Height 03/14/24 1154 5\' 11"  (1.803 m)     Head Circumference --      Peak Flow --      Pain Score --      Pain Loc --      Pain Education --      Exclude from Growth Chart --     Most recent vital  signs: Vitals:   03/14/24 1300 03/14/24 1455  BP: 114/68 130/77  Pulse: 76 73  Resp: (!) 22 18  Temp:  98.8 F (37.1 C)  SpO2: 97% 97%     General: Awake, no distress.  CV:  Good peripheral perfusion.  Resp:  Normal effort.  Abd:  No distention.  Soft nontender Other:  Jaundiced   ED Results / Procedures / Treatments   Labs (all labs ordered are listed, but only abnormal results are displayed) Labs Reviewed  COMPREHENSIVE METABOLIC PANEL WITH GFR - Abnormal; Notable for the following components:      Result Value   Chloride 97 (*)    Glucose, Bld 146 (*)    Albumin 3.1 (*)    AST 208 (*)    ALT 520 (*)    Alkaline Phosphatase 632 (*)    Total Bilirubin 17.1 (*)    All other components within normal limits  CBC - Abnormal; Notable for the following components:   RDW 17.7 (*)    All other components within normal limits  LIPASE, BLOOD  URINALYSIS, ROUTINE W REFLEX MICROSCOPIC  CA 19-9 (SERIAL)  HEMOGLOBIN A1C  CBG MONITORING, ED     EKG  EKG shows, sinus rhythm, rate of 79, normal QRS, normal QTc, T wave version to 3, aVF, V6, T wave flattening to V3 through V5, not significantly compared to prior     PROCEDURES:  Critical Care performed: No  Procedures   MEDICATIONS ORDERED IN ED: Medications  aspirin  EC tablet 81 mg (has no administration in time range)  nitrofurantoin  (macrocrystal-monohydrate) (MACROBID ) capsule 100 mg (has no administration in time range)  linagliptin (TRADJENTA) tablet 5 mg (has no administration in time range)  tamsulosin  (FLOMAX ) capsule 0.4 mg (has no administration in time range)  ondansetron  (ZOFRAN ) tablet 4 mg (has no administration in time range)    Or  ondansetron  (ZOFRAN ) injection 4 mg (has no administration in time range)  insulin  aspart (novoLOG ) injection 0-15 Units (has no administration in time range)  enoxaparin  (LOVENOX ) injection 45 mg (has no administration in time range)  gadobutrol  (GADAVIST ) 1 MMOL/ML  injection 9 mL (10 mLs Intravenous Contrast Given 03/14/24 1400)     IMPRESSION / MDM / ASSESSMENT AND PLAN / ED COURSE  I reviewed the triage vital signs and the nursing notes.                              Differential diagnosis includes, but is not limited to, choledocholithiasis, mass, malignancy, he denies any fever or urinary symptoms suggest UTI.  Repeat labs were drawn in triage, will add on an MRI abdomen as well as MRCP.  He will need to be admitted for further management, GI evaluation and likely stenting.  Shared decision making done with patient and he is agreeable with the plan.  Patient's presentation is most consistent with acute presentation with potential threat to life or bodily function.  Independent interpretation of labs and imaging below.  The hospitalist was agreeable with the plan for admission and will evaluate the patient.  He is admitted.  The patient is on the cardiac monitor to evaluate for evidence of arrhythmia and/or significant heart rate changes.       FINAL CLINICAL IMPRESSION(S) / ED DIAGNOSES   Final diagnoses:  Elevated LFTs  Jaundice  Weight loss     Rx / DC Orders   ED Discharge Orders     None        Note:  This document was prepared using Dragon voice recognition software and may include unintentional dictation errors.    Shane Darling, MD 03/14/24 [phone]

## 2024-03-14 NOTE — ED Notes (Signed)
RN aware bed assigned ?

## 2024-03-15 ENCOUNTER — Inpatient Hospital Stay

## 2024-03-15 ENCOUNTER — Inpatient Hospital Stay: Admitting: Anesthesiology

## 2024-03-15 ENCOUNTER — Encounter
Admission: EM | Disposition: A | Discharge status: Home / Self Care | Payer: Self-pay | Source: Home / Self Care | Attending: Internal Medicine

## 2024-03-15 ENCOUNTER — Encounter: Payer: Self-pay | Admitting: Internal Medicine

## 2024-03-15 DIAGNOSIS — K838 Other specified diseases of biliary tract: Secondary | ICD-10-CM | POA: Diagnosis not present

## 2024-03-15 DIAGNOSIS — R17 Unspecified jaundice: Secondary | ICD-10-CM | POA: Diagnosis not present

## 2024-03-15 DIAGNOSIS — K831 Obstruction of bile duct: Secondary | ICD-10-CM

## 2024-03-15 HISTORY — PX: ERCP: SHX5425

## 2024-03-15 LAB — GLUCOSE, CAPILLARY
Glucose-Capillary: 81 mg/dL (ref 70–99)
Glucose-Capillary: 77 mg/dL (ref 70–99)
Glucose-Capillary: 148 mg/dL — ABNORMAL HIGH (ref 70–99)
Glucose-Capillary: 194 mg/dL — ABNORMAL HIGH (ref 70–99)

## 2024-03-15 LAB — COMPREHENSIVE METABOLIC PANEL WITH GFR
ALT: 397 U/L — ABNORMAL HIGH (ref 0–44)
AST: 167 U/L — ABNORMAL HIGH (ref 15–41)
Albumin: 2.8 g/dL — ABNORMAL LOW (ref 3.5–5.0)
Alkaline Phosphatase: 549 U/L — ABNORMAL HIGH (ref 38–126)
Anion gap: 9 (ref 5–15)
BUN: 18 mg/dL (ref 8–23)
CO2: 22 mmol/L (ref 22–32)
Calcium: 8.7 mg/dL — ABNORMAL LOW (ref 8.9–10.3)
Chloride: 103 mmol/L (ref 98–111)
Creatinine, Ser: 0.57 mg/dL — ABNORMAL LOW (ref 0.61–1.24)
GFR, Estimated: >60 mL/min (ref >60)
Glucose, Bld: 77 mg/dL (ref 70–99)
Potassium: 3.7 mmol/L (ref 3.5–5.1)
Sodium: 134 mmol/L — ABNORMAL LOW (ref 135–145)
Total Bilirubin: 17.2 mg/dL — ABNORMAL HIGH (ref 0.0–1.2)
Total Protein: 6.4 g/dL — ABNORMAL LOW (ref 6.5–8.1)

## 2024-03-15 LAB — CBC
HCT: 41.9 % (ref 39.0–52.0)
Hemoglobin: 13.7 g/dL (ref 13.0–17.0)
MCH: 30.4 pg (ref 26.0–34.0)
MCHC: 32.7 g/dL (ref 30.0–36.0)
MCV: 92.9 fL (ref 80.0–100.0)
Platelets: 176 10*3/uL (ref 150–400)
RBC: 4.51 MIL/uL (ref 4.22–5.81)
RDW: 17.6 % — ABNORMAL HIGH (ref 11.5–15.5)
WBC: 8.3 10*3/uL (ref 4.0–10.5)
nRBC: 0 % (ref 0.0–0.2)

## 2024-03-15 SURGERY — ERCP, WITH INTERVENTION IF INDICATED
Anesthesia: General

## 2024-03-15 MED ORDER — DICLOFENAC SUPPOSITORY 100 MG
RECTAL | Status: AC
Start: 2024-03-15 — End: ?
  Filled 2024-03-15: qty 1

## 2024-03-15 MED ORDER — LIDOCAINE HCL (CARDIAC) PF 100 MG/5ML IV SOSY
PREFILLED_SYRINGE | INTRAVENOUS | Status: DC | PRN
  Administered 2024-03-15: 50 mg via INTRAVENOUS

## 2024-03-15 MED ORDER — ONDANSETRON HCL 4 MG/2ML IJ SOLN
INTRAMUSCULAR | Status: DC | PRN
  Administered 2024-03-15: 4 mg via INTRAVENOUS

## 2024-03-15 MED ORDER — PROPOFOL 10 MG/ML IV BOLUS
INTRAVENOUS | Status: DC | PRN
  Administered 2024-03-15: 80 mg via INTRAVENOUS
  Administered 2024-03-15: 125 ug/kg/min via INTRAVENOUS

## 2024-03-15 MED ORDER — DEXAMETHASONE SODIUM PHOSPHATE 10 MG/ML IJ SOLN
INTRAMUSCULAR | Status: DC | PRN
  Administered 2024-03-15: 10 mg via INTRAVENOUS

## 2024-03-15 MED ORDER — DEXMEDETOMIDINE HCL IN NACL 80 MCG/20ML IV SOLN
INTRAVENOUS | Status: DC | PRN
Start: 2024-03-15 — End: 2024-03-15
  Administered 2024-03-15: 12 ug via INTRAVENOUS

## 2024-03-15 MED ORDER — PHENYLEPHRINE 80 MCG/ML (10ML) SYRINGE FOR IV PUSH (FOR BLOOD PRESSURE SUPPORT)
PREFILLED_SYRINGE | INTRAVENOUS | Status: DC | PRN
  Administered 2024-03-15: 80 ug via INTRAVENOUS

## 2024-03-15 MED ORDER — CALCIUM CARBONATE ANTACID 500 MG PO CHEW
400.0000 mg | CHEWABLE_TABLET | Freq: Three times a day (TID) | ORAL | Status: DC | PRN
  Administered 2024-03-15 – 2024-03-16 (×2): 400 mg via ORAL
  Filled 2024-03-15 (×2): qty 2

## 2024-03-15 MED ORDER — LACTATED RINGERS IV SOLN: INTRAVENOUS | Status: DC

## 2024-03-15 MED ORDER — DICLOFENAC SUPPOSITORY 100 MG
100.0000 mg | Freq: Once | RECTAL | Status: AC
  Administered 2024-03-15: 100 mg via RECTAL

## 2024-03-15 MED ORDER — PROPOFOL 10 MG/ML IV BOLUS
INTRAVENOUS | Status: AC
  Filled 2024-03-15: qty 20

## 2024-03-15 MED ORDER — PHENYLEPHRINE 80 MCG/ML (10ML) SYRINGE FOR IV PUSH (FOR BLOOD PRESSURE SUPPORT)
PREFILLED_SYRINGE | INTRAVENOUS | Status: AC
  Filled 2024-03-15: qty 10

## 2024-03-15 NOTE — Op Note (Signed)
 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 Procedure:             Pre-Anesthesia Assessment:                        - Prior to the procedure, a History and Physical was                         performed, and patient medications and allergies were                         reviewed. The patient's tolerance of previous                         anesthesia was also reviewed. The risks and benefits                         of the procedure and the sedation options and risks                         were discussed with the patient. All questions were                         answered, and informed consent was obtained. Prior                         Anticoagulants: The patient has taken no anticoagulant                         or antiplatelet agents. ASA Grade Assessment: II - A                         patient with mild systemic disease. After reviewing                         the risks and benefits, the patient was deemed in                         satisfactory condition to undergo the procedure.  After obtaining informed consent, the scope was passed                         under direct vision. Throughout the procedure, the                         patient's blood pressure, pulse, and oxygen                          saturations were monitored continuously. The                         Duodenoscope was introduced through the mouth, and                         used to inject contrast into and used to cannulate the                         bile duct. The ERCP was accomplished without                         difficulty. The patient tolerated the procedure well. Findings:      The scout film was normal. The esophagus was successfully intubated       under direct vision. The scope was advanced to a normal major papilla in       the descending duodenum without detailed examination of the pharynx,       larynx and associated structures, and upper GI tract. The upper GI tract       was grossly normal. The bile duct was deeply cannulated with the       short-nosed traction sphincterotome. Contrast was injected. I personally       interpreted the bile duct images. There was brisk flow of contrast       through the ducts. Image quality was excellent. Contrast extended to the       entire biliary tree. The common hepatic duct contained a single       segmental stenosis 7 mm in length. A wire was passed into the biliary       tree. A 7 mm biliary sphincterotomy was made with a traction (standard)       sphincterotome using ERBE electrocautery. There was no       post-sphincterotomy bleeding. Cells for cytology were obtained by       brushing in the upper third of the main bile duct. One 7 Fr by 12 cm       plastic stent with a single external flap and a single internal flap was       placed 9 cm into the common bile duct. The stent was in good position. 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                        - A biliary sphincterotomy was performed.                        -  Cells for cytology obtained in the upper third of                         the main bile duct.                        - One plastic stent was  placed into the common bile                         duct. Recommendation:        - Return patient to hospital ward for ongoing care.                        - Clear liquid diet today.                        - Continue present medications.                        - Await cytology results.                        - Watch for pancreatitis, bleeding, perforation, and                         cholangitis. Procedure Code(s):     --- Professional ---                        [phone], Endoscopic retrograde cholangiopancreatography                         (ERCP); with placement of endoscopic stent into                         biliary or pancreatic duct, including pre- and                         post-dilation and guide wire passage, when performed,                         including sphincterotomy, when performed, each stent                        [id number], Endoscopic catheterization of the biliary                         ductal system, radiological supervision and                         interpretation Diagnosis Code(s):     --- Professional ---                        K83.1, Obstruction of bile duct CPT copyright 2022 American Medical Association. All rights reserved. The codes documented in this report are preliminary and upon coder review may  be revised to meet current compliance requirements. Marnee Sink MD, MD 03/15/2024 2:37:02 PM This report has been signed electronically. Number of Addenda: 0 Note Initiated On: 03/15/2024 1:37 PM Estimated Blood Loss:  Estimated blood loss: none.      [facility]

## 2024-03-15 NOTE — Plan of Care (Signed)

## 2024-03-15 NOTE — Progress Notes (Addendum)
 Progress Note    Casey Peters  WUJ:[id number] DOB: Nov 29, 1945  DOA: 03/14/2024 PCP: Little Riff, MD      Brief Narrative:    Medical records reviewed and are as summarized below:  Casey Peters is a 78 y.o. male with medical history significant of dilated cardiomyopathy, CAD RCA stent in 2023, HTN, HLD, IIDM, BPH with history of recurrent UTIs on suppressive antibiotics, who presented to the hospital because of abnormal labs (elevated liver enzymes) and CT abdomen findings.  Patient reported fatigue for several weeks and also complained of 8 pound weight loss in the 2 weeks preceding admission.  He complained of dark urine without any dysuria or urinary frequency.  He noticed yellowish discoloration of his eyes.  No abdominal pain or vomiting.     Workup revealed elevated liver enzymes with significant hyperbilirubinemia and intrahepatic biliary obstruction.    Assessment/Plan:   Principal Problem:   Painless jaundice   Body mass index is 27.89 kg/m.   Elevated liver enzymes, painless jaundice, intrahepatic biliary duct dilatation: Monitor liver signs.  Plan for ERCP today.  Follow-up with gastroenterologist.   Abnormal urinalysis, history of recurrent UTIs: Patient is symptomatic.[name]  No concern for UTI.  Follow-up urine culture. Continue nitrofurantoin  (suppressive antibiotic)   CAD s/p RCA stent in 2023: Continue aspirin .  Statin on hold because of elevated liver enzymes.   Colonic diverticulosis without diverticulitis, 2.2 cm splenic lesion, aortic atherosclerosis noted on MRCP.  Outpatient follow-up   Comorbidities include BPH, hypertension, type II DM, hyperlipidemia   Diet Order             Diet NPO time specified Except for: Sips with Meds  Diet effective midnight                            Consultants: Gastroenterologist  Procedures: For ERCP    Medications:    aspirin  EC  81 mg Oral QPM   insulin  aspart   0-15 Units Subcutaneous TID WC   linagliptin  5 mg Oral QPM   nitrofurantoin  (macrocrystal-monohydrate)  100 mg Oral Daily   tamsulosin   0.4 mg Oral Daily   Continuous Infusions:   Anti-infectives (From admission, onward)    Start     Dose/Rate Route Frequency Ordered Stop   03/15/24 1000  nitrofurantoin  (macrocrystal-monohydrate) (MACROBID ) capsule 100 mg        100 mg Oral Daily 03/14/24 1412                Family Communication/Anticipated D/C date and plan/Code Status   DVT prophylaxis: Place and maintain sequential compression device Start: 03/14/24 1614     Code Status: Full Code  Family Communication: Plan discussed with his wife at the bedside Disposition Plan: Plan to discharge home   Status is: Inpatient Remains inpatient appropriate because: Workup for painless jaundice and elevated liver enzymes       Subjective:   Interval events noted. He complains of dark urine but no other urinary symptoms.  He does not feel that he has a UTI.  No abdominal pain.  His wife was at the bedside.  Tina, LPN, was also at the bedside.  Objective:    Vitals:   03/14/24 1602 03/14/24 2019 03/15/24 0415 03/15/24 0823  BP: (!) 148/80 105/88 121/76 124/72  Pulse: 69 68 70 75  Resp: 18   18  Temp: 98.8 F (37.1 C) 98.8 F (37.1 C) 98.8  F (37.1 C) 98.8 F (37.1 C)  TempSrc: Oral     SpO2: 99% 97% 95% 95%  Weight:      Height:       No data found.   Intake/Output Summary (Last 24 hours) at 03/15/2024 1043 Last data filed at 03/14/2024 1900 Gross per 24 hour  Intake 240 ml  Output 200 ml  Net 40 ml   Filed Weights   03/14/24 1154  Weight: 90.7 kg    Exam:  GEN: NAD SKIN: Warm and dry.  Jaundice EYES: Icteric.  No pallor ENT: MMM CV: RRR PULM: CTA B ABD: soft, ND, NT, +BS CNS: AAO x 3, non focal EXT: No edema or tenderness        Data Reviewed:   I have personally reviewed following labs and imaging studies:  Labs: Labs show the  following:   Basic Metabolic Panel: Recent Labs  Lab 03/14/24 1159 03/15/24 0433  NA 135 134*  K 3.8 3.7  CL 97* 103  CO2 25 22  GLUCOSE 146* 77  BUN 23 18  CREATININE 0.75 0.57*  CALCIUM  9.1 8.7*   GFR Estimated Creatinine Clearance: 90.6 mL/min (A) (by C-G formula based on SCr of 0.57 mg/dL (L)). Liver Function Tests: Recent Labs  Lab 03/14/24 1159 03/15/24 0433  AST 208* 167*  ALT 520* 397*  ALKPHOS 632* 549*  BILITOT 17.1* 17.2*  PROT 7.2 6.4*  ALBUMIN 3.1* 2.8*   Recent Labs  Lab 03/14/24 1159  LIPASE 25   No results for input(s): "AMMONIA" in the last 168 hours. Coagulation profile No results for input(s): "INR", "PROTIME" in the last 168 hours.  CBC: Recent Labs  Lab 03/14/24 1159 03/15/24 0433  WBC 9.0 8.3  HGB 15.2 13.7  HCT 46.7 41.9  MCV 93.2 92.9  PLT 191 176   Cardiac Enzymes: No results for input(s): "CKTOTAL", "CKMB", "CKMBINDEX", "TROPONINI" in the last 168 hours. BNP (last 3 results) No results for input(s): "PROBNP" in the last 8760 hours. CBG: Recent Labs  Lab 03/14/24 1651 03/14/24 2019 03/15/24 0824  GLUCAP 128* 86 81   D-Dimer: No results for input(s): "DDIMER" in the last 72 hours. Hgb A1c: Recent Labs    03/14/24 1159  HGBA1C 6.9*   Lipid Profile: No results for input(s): "CHOL", "HDL", "LDLCALC", "TRIG", "CHOLHDL", "LDLDIRECT" in the last 72 hours. Thyroid function studies: No results for input(s): "TSH", "T4TOTAL", "T3FREE", "THYROIDAB" in the last 72 hours.  Invalid input(s): "FREET3" Anemia work up: No results for input(s): "VITAMINB12", "FOLATE", "FERRITIN", "TIBC", "IRON", "RETICCTPCT" in the last 72 hours. Sepsis Labs: Recent Labs  Lab 03/14/24 1159 03/15/24 0433  WBC 9.0 8.3    Microbiology No results found for this or any previous visit (from the past 240 hours).  Procedures and diagnostic studies:  MR ABDOMEN MRCP W WO CONTAST Result Date: 03/14/2024 CLINICAL DATA:  Further evaluation of  intrahepatic bile duct dilation. Nausea. EXAM: MRI ABDOMEN WITHOUT AND WITH CONTRAST (INCLUDING MRCP) TECHNIQUE: Multiplanar multisequence MR imaging of the abdomen was performed both before and after the administration of intravenous contrast. Heavily T2-weighted images of the biliary and pancreatic ducts were obtained. Post-processing was applied at the acquisition scanner with concurrent physician supervision which includes 3D reconstructions, MIPs, volume rendered images and/or shaded surface rendering. CONTRAST:  10mL GADAVIST  GADOBUTROL  1 MMOL/ML IV SOLN COMPARISON:  CT abdomen and pelvis dated 03/10/2024 FINDINGS: Lower chest: No acute findings. Hepatobiliary: Atrophic left hepatic lobe. Moderate intrahepatic bile duct dilation with abrupt tapering at  the level of the hepatic hilum (14:39). The common bile duct is nondilated, also demonstrating tapering at the hepatic hilum. No discrete filling defect, obstructing lesion, or signal abnormality. Underdistended gallbladder. Pancreas: No mass, inflammatory changes, or other parenchymal abnormality identified. Spleen: 2.2 x 1.8 cm mildly T2 hyperintense, enhancing focus along the medial posterior spleen (9:15) is subtly seen in retrospect on prior CT examinations and only minimally increased in size, likely benign such as a venous malformation or hamartoma. Adrenals/Urinary Tract: No adrenal nodules. No suspicious renal masses identified. No evidence of hydronephrosis. Bilateral simple cysts. No specific follow-up imaging recommended. Stomach/Bowel: Colonic diverticulosis without acute diverticulitis. Vascular/Lymphatic: No pathologically enlarged lymph nodes identified. No abdominal aortic aneurysm demonstrated. Aortic atherosclerosis. Other:  None. Musculoskeletal: No suspicious bone lesions identified. IMPRESSION: 1. Moderate intrahepatic bile duct dilation with abrupt tapering at the level of the hepatic hilum. No discrete filling defect, obstructing lesion,  or signal abnormality. Findings may be secondary to benign stricture or cholangiocarcinoma. Recommend consultation to gastroenterology. 2. Colonic diverticulosis without acute diverticulitis. 3. A 2.2 cm mildly T2 hyperintense, enhancing focus along the medial posterior spleen is subtly seen in retrospect on prior CT examinations and only minimally increased in size, likely benign such as a venous malformation or hamartoma. 4. Aortic Atherosclerosis (ICD10-I70.0). Electronically Signed   By: Limin  Xu M.D.   On: 03/14/2024 15:45   MR 3D Recon At Scanner Result Date: 03/14/2024 CLINICAL DATA:  Further evaluation of intrahepatic bile duct dilation. Nausea. EXAM: MRI ABDOMEN WITHOUT AND WITH CONTRAST (INCLUDING MRCP) TECHNIQUE: Multiplanar multisequence MR imaging of the abdomen was performed both before and after the administration of intravenous contrast. Heavily T2-weighted images of the biliary and pancreatic ducts were obtained. Post-processing was applied at the acquisition scanner with concurrent physician supervision which includes 3D reconstructions, MIPs, volume rendered images and/or shaded surface rendering. CONTRAST:  10mL GADAVIST  GADOBUTROL  1 MMOL/ML IV SOLN COMPARISON:  CT abdomen and pelvis dated 03/10/2024 FINDINGS: Lower chest: No acute findings. Hepatobiliary: Atrophic left hepatic lobe. Moderate intrahepatic bile duct dilation with abrupt tapering at the level of the hepatic hilum (14:39). The common bile duct is nondilated, also demonstrating tapering at the hepatic hilum. No discrete filling defect, obstructing lesion, or signal abnormality. Underdistended gallbladder. Pancreas: No mass, inflammatory changes, or other parenchymal abnormality identified. Spleen: 2.2 x 1.8 cm mildly T2 hyperintense, enhancing focus along the medial posterior spleen (9:15) is subtly seen in retrospect on prior CT examinations and only minimally increased in size, likely benign such as a venous malformation or  hamartoma. Adrenals/Urinary Tract: No adrenal nodules. No suspicious renal masses identified. No evidence of hydronephrosis. Bilateral simple cysts. No specific follow-up imaging recommended. Stomach/Bowel: Colonic diverticulosis without acute diverticulitis. Vascular/Lymphatic: No pathologically enlarged lymph nodes identified. No abdominal aortic aneurysm demonstrated. Aortic atherosclerosis. Other:  None. Musculoskeletal: No suspicious bone lesions identified. IMPRESSION: 1. Moderate intrahepatic bile duct dilation with abrupt tapering at the level of the hepatic hilum. No discrete filling defect, obstructing lesion, or signal abnormality. Findings may be secondary to benign stricture or cholangiocarcinoma. Recommend consultation to gastroenterology. 2. Colonic diverticulosis without acute diverticulitis. 3. A 2.2 cm mildly T2 hyperintense, enhancing focus along the medial posterior spleen is subtly seen in retrospect on prior CT examinations and only minimally increased in size, likely benign such as a venous malformation or hamartoma. 4. Aortic Atherosclerosis (ICD10-I70.0). Electronically Signed   By: Limin  Xu M.D.   On: 03/14/2024 15:45  LOS: 1 day   Kishawn Pickar  [facility]   Pager on [email or web address]. If 7PM-7AM, please contact night-coverage at [email or web address]     03/15/2024, 10:43 AM

## 2024-03-15 NOTE — Anesthesia Postprocedure Evaluation (Signed)
 Anesthesia Post Note  Patient: Casey Peters  Procedure(s) Performed: ERCP, WITH INTERVENTION IF INDICATED  Patient location during evaluation: Endoscopy Anesthesia Type: General Level of consciousness: awake and alert Pain management: pain level controlled Vital Signs Assessment: post-procedure vital signs reviewed and stable Respiratory status: spontaneous breathing, nonlabored ventilation, respiratory function stable and patient connected to nasal cannula oxygen Cardiovascular status: blood pressure returned to baseline and stable Postop Assessment: no apparent nausea or vomiting Anesthetic complications: no   No notable events documented.   Last Vitals:  Vitals:   03/15/24 1451 03/15/24 1502  BP: 117/82 132/75  Pulse: 79 74  Resp:  (!) 24  Temp:    SpO2: 96% 98%    Last Pain:  Vitals:   03/15/24 1502  TempSrc:   PainSc: 0-No pain                 Nancey Awkward

## 2024-03-15 NOTE — TOC Initial Note (Signed)
 Transition of Care [facility]) - Initial/Assessment Note    Patient Details  Name: Casey Peters MRN: [id number] Date of Birth: Sep 03, 1946  [facility]) CM/SW Contact:    Elsie Halo, RN Phone Number: 03/15/2024, 3:32 PM  Clinical Narrative:                  Patient is from home with his wife. He drives and doesn't report difficulty getting to and from appointments. His wife will drive him home at discharge. His PCP Is Dr. Marisa Sickles and [facility] He doesn't use any DME. Therapy evals have not been completed at this time.  No TOC needs, please outreach to [facility] if needs identified.    Expected Discharge Plan: Home/Self Care Barriers to Discharge: Continued Medical Work up   Patient Goals and CMS Choice            Expected Discharge Plan and Services   Discharge Planning Services: CM Consult   Living arrangements for the past 2 months: Single Family Home                                      Prior Living Arrangements/Services Living arrangements for the past 2 months: Single Family Home Lives with:: Spouse                   Activities of Daily Living   ADL Screening (condition at time of admission) Independently performs ADLs?: Yes (appropriate for developmental age) Is the patient deaf or have difficulty hearing?: No Does the patient have difficulty seeing, even when wearing glasses/contacts?: No Does the patient have difficulty concentrating, remembering, or making decisions?: No  Permission Sought/Granted                  Emotional Assessment       Orientation: : Oriented to Self, Oriented to Place, Oriented to  Time, Oriented to Situation   Psych Involvement: No (comment)  Admission diagnosis:  Jaundice [R17] Weight loss [R63.4] Elevated LFTs [R79.89] Painless jaundice [R17] Patient Active Problem List   Diagnosis Date Noted   Painless jaundice 03/14/2024   Shoulder pain 12/28/2021   STEMI (ST  elevation myocardial infarction) (HCC) 12/27/2021   STEMI involving right coronary artery (HCC) 12/27/2021   S/P cardiac catheterization 12/27/2021   Dilated idiopathic cardiomyopathy (HCC)    Hypertriglyceridemia    HFrEF (heart failure with reduced ejection fraction) (HCC)    Palpitations 02/08/2020   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   Pain of finger 06/29/2019   Chronic systolic heart failure (HCC) 10/25/2018   Tobacco use 10/25/2018   Rising PSA level 07/10/2015   Microscopic hematuria 07/10/2015   Type 2 diabetes mellitus (HCC) 06/08/2014   HLD (hyperlipidemia) 06/08/2014   BP (high blood pressure) 06/08/2014   Obesity, diabetes, and hypertension syndrome (HCC) 06/08/2014   Adiposity 06/08/2014   Controlled type 2 diabetes mellitus without complication (HCC) 06/08/2014   PCP:  Little Riff, MD Pharmacy:   [facility] - [place], [place] - [address] [place] [address] Phone: [phone] Fax: [phone]     Social Drivers of Health (SDOH) Social History: SDOH Screenings   Food Insecurity: No Food Insecurity (03/14/2024)  Housing: Low Risk  (03/14/2024)  Transportation Needs: No Transportation Needs (03/14/2024)  Utilities: Not At Risk (03/14/2024)  Depression (PHQ2-9): Low Risk  (11/10/2018)  Social Connections:  Socially Integrated (03/14/2024)  Tobacco Use: High Risk (03/15/2024)   SDOH Interventions:     Readmission Risk Interventions     No data to display

## 2024-03-15 NOTE — Progress Notes (Signed)
 Pt is off unit to OR for ERCP.

## 2024-03-15 NOTE — Transfer of Care (Signed)
 Immediate Anesthesia Transfer of Care Note  Patient: Casey Peters  Procedure(s) Performed: ERCP, WITH INTERVENTION IF INDICATED  Patient Location: PACU  Anesthesia Type:General  Level of Consciousness: sedated  Airway & Oxygen Therapy: Patient Spontanous Breathing  Post-op Assessment: Report given to RN and Post -op Vital signs reviewed and stable  Post vital signs: Reviewed and stable  Last Vitals:  Vitals Value Taken Time  BP 108/75 03/15/24 1441  Temp 36.4 C 03/15/24 1440  Pulse 79 03/15/24 1442  Resp 14 03/15/24 1442  SpO2 100 % 03/15/24 1442  Vitals shown include unfiled device data.  Last Pain:  Vitals:   03/15/24 1440  TempSrc: Temporal  PainSc: Asleep         Complications: No notable events documented.

## 2024-03-15 NOTE — Progress Notes (Signed)
 This Clinical research associate collected a clean catch urine specimen from the patient for order for UA. The urine is dark in color. The urine specimen was labeled and sent to Lab.

## 2024-03-15 NOTE — Anesthesia Preprocedure Evaluation (Signed)
 Anesthesia Evaluation  Patient identified by MRN, date of birth, ID band Patient awake    Reviewed: Allergy & Precautions, NPO status , Patient's Chart, lab work & pertinent test results  History of Anesthesia Complications Negative for: history of anesthetic complications  Airway Mallampati: II  TM Distance: >3 FB Neck ROM: Full    Dental  (+) Chipped   Pulmonary neg sleep apnea, neg COPD, Current Smoker and Patient abstained from smoking.   Pulmonary exam normal breath sounds clear to auscultation       Cardiovascular Exercise Tolerance: Good METShypertension, Pt. on medications + Past MI, + Cardiac Stents and +CHF  (-) CAD (-) dysrhythmias  Rhythm:Regular Rate:Normal - Systolic murmurs TTE 06/2023: CONCLUSION -------------------------------------------------------------------------------  MILD LEFT VENTRICULAR SYSTOLIC DYSFUNCTION WITH MILD LVH  ESTIMATED EF: 50%  NORMAL LA PRESSURES WITH DIASTOLIC DYSFUNCTION  NORMAL RIGHT VENTRICULAR SYSTOLIC FUNCTION  VALVULAR REGURGITATION: No AR, TRIVIAL MR, No PR, MILD TR  NO VALVULAR STENOSIS      Neuro/Psych  Neuromuscular disease  negative psych ROS   GI/Hepatic ,neg GERD  ,,(+)     (-) substance abuse   Painless jaundice, found to have CBD obstruction. Denies nausea/vomiting   Endo/Other  diabetes, Well Controlled, Oral Hypoglycemic Agents    Renal/GU negative Renal ROS     Musculoskeletal   Abdominal   Peds  Hematology   Anesthesia Other Findings Past Medical History: No date: Cardiomegaly No date: CHF (congestive heart failure) (HCC) No date: Constipation No date: Diabetic polyneuropathy (HCC) No date: Dilated idiopathic cardiomyopathy (HCC) No date: Erectile dysfunction No date: HLD (hyperlipidemia) No date: HTN (hypertension) No date: Low testosterone  No date: Palpitations No date: Pneumonia No date: T2DM (type 2 diabetes mellitus) (HCC)   Reproductive/Obstetrics                             Anesthesia Physical Anesthesia Plan  ASA: 3  Anesthesia Plan: General   Post-op Pain Management: Minimal or no pain anticipated   Induction: Intravenous  PONV Risk Score and Plan: 2 and Propofol  infusion, TIVA, Ondansetron  and Treatment may vary due to age or medical condition  Airway Management Planned: Nasal Cannula  Additional Equipment: None  Intra-op Plan:   Post-operative Plan:   Informed Consent: I have reviewed the patients History and Physical, chart, labs and discussed the procedure including the risks, benefits and alternatives for the proposed anesthesia with the patient or authorized representative who has indicated his/her understanding and acceptance.     Dental advisory given  Plan Discussed with: CRNA and Surgeon  Anesthesia Plan Comments: (Discussed risks of anesthesia with patient, including possibility of difficulty with spontaneous ventilation under anesthesia necessitating airway intervention, PONV, and rare risks such as cardiac or respiratory or neurological events, and allergic reactions. Discussed the role of CRNA in patient's perioperative care. Patient understands.)       Anesthesia Quick Evaluation

## 2024-03-15 NOTE — Plan of Care (Signed)
 Casey Faith, LPN

## 2024-03-15 NOTE — Anesthesia Procedure Notes (Signed)
 Date/Time: 03/15/2024 1:54 PM  Performed by: Angelia Kelp, CRNAPre-anesthesia Checklist: Patient identified, Emergency Drugs available, Suction available, Patient being monitored and Timeout performed Patient Re-evaluated:Patient Re-evaluated prior to induction Oxygen Delivery Method: Nasal cannula Preoxygenation: Pre-oxygenation with 100% oxygen Induction Type: IV induction

## 2024-03-15 NOTE — Consult Note (Signed)
 Marnee Sink, MD [facility]  [address] [place], [place] [address] Phone: [phone] Fax : [phone]  Consultation  Referring Provider:     Dr. Jeane Miguel Primary Care Physician:  Little Riff, MD Primary Gastroenterologist: Para Bold        Reason for Consultation:     Jaundice  Date of Admission:  03/14/2024 Date of Consultation:  03/15/2024         HPI:   Casey Peters is a 78 y.o. male who was admitted with a report of weakness and fatigue and has had an 8 pound weight loss in the last couple of weeks.  He has also been noted to have dark urine.  The patient had seen his PCP and was told the liver enzymes were elevated and had a CT scan showing:  IMPRESSION:  New mild intrahepatic biliary ductal dilatation predominantly affecting the left lobe. Recommend further evaluation with multiphase MRI of the abdomen to include MRCP sequencing.  The patient had been reported to be having symptoms for the last few weeks with lower abdominal pain and pain at night.  The patient then came to the emergency department and had an MRCP that showed:  IMPRESSION: 1. Moderate intrahepatic bile duct dilation with abrupt tapering at the level of the hepatic hilum. No discrete filling defect, obstructing lesion, or signal abnormality. Findings may be secondary to benign stricture or cholangiocarcinoma. Recommend consultation to gastroenterology. 2. Colonic diverticulosis without acute diverticulitis. 3. A 2.2 cm mildly T2 hyperintense, enhancing focus along the medial posterior spleen is subtly seen in retrospect on prior CT examinations and only minimally increased in size, likely benign such as a venous malformation or hamartoma. 4. Aortic Atherosclerosis     The patient's labs also showed his liver enzymes to be abnormal with the recent labs showing:  Component     Latest Ref Rng 03/14/2024 03/15/2024  Albumin     3.5 - 5.0 g/dL 3.1 (L)  2.8 (L)   AST     15 - 41  U/L 208 (H)  167 (H)   ALT     0 - 44 U/L 520 (H)  397 (H)   Alkaline Phosphatase     38 - 126 U/L 632 (H)  549 (H)   Total Bilirubin     0.0 - 1.2 mg/dL 38.7 (H)  56.4 (H)    The patient denies symptoms prior to his present presentation.  Past Medical History:  Diagnosis Date   Cardiomegaly    CHF (congestive heart failure) (HCC)    Constipation    Diabetic polyneuropathy (HCC)    Dilated idiopathic cardiomyopathy (HCC)    Erectile dysfunction    HLD (hyperlipidemia)    HTN (hypertension)    Low testosterone     Palpitations    Pneumonia    T2DM (type 2 diabetes mellitus) (HCC)     Past Surgical History:  Procedure Laterality Date   CORONARY/GRAFT ACUTE MI REVASCULARIZATION N/A 12/27/2021   Procedure: Coronary/Graft Acute MI Revascularization;  Surgeon: Sammy Crisp, MD;  Location: [facility] INVASIVE CV LAB;  Service: Cardiovascular;  Laterality: N/A;   CYSTOSCOPY WITH URETHRAL DILATATION N/A 06/17/2021   Procedure: CYSTOSCOPY WITH URETHRAL DILATATION;  Surgeon: Geraline Knapp, MD;  Location: [facility] ORS;  Service: Urology;  Laterality: N/A;  Optilume Balloon   HERNIA REPAIR  1998   LEFT HEART CATH AND CORONARY ANGIOGRAPHY N/A 12/27/2021   Procedure: LEFT HEART CATH AND CORONARY ANGIOGRAPHY;  Surgeon: Sammy Crisp,  MD;  Location: [facility] INVASIVE CV LAB;  Service: Cardiovascular;  Laterality: N/A;   PILONIDAL CYST EXCISION     TONSILLECTOMY     URETHRA SURGERY  1995    Prior to Admission medications   Medication Sig Start Date End Date Taking? Authorizing Provider  ascorbic acid (VITAMIN C) 500 MG tablet Take 500 mg by mouth daily.   Yes [provider]  aspirin  EC 81 MG tablet Take 81 mg by mouth daily.   Yes [provider]  cholecalciferol (VITAMIN D3) 25 MCG (1000 UNIT) tablet Take 1,000 Units by mouth daily.   Yes [provider]  furosemide (LASIX) 40 MG tablet Take 40 mg by mouth daily.   Yes [provider]  glimepiride (AMARYL) 1  MG tablet Take 2 mg by mouth daily with breakfast. 03/03/24 03/03/25 Yes [provider]  JANUVIA 50 MG tablet Take 1 tablet by mouth daily. 02/29/24 02/28/25 Yes [provider]  nitrofurantoin , macrocrystal-monohydrate, (MACROBID ) 100 MG capsule TAKE 1 CAPSULE BY MOUTH ONCE DAILY 11/18/23  Yes Vaillancourt, Samantha, PA-C  rosuvastatin  (CRESTOR ) 5 MG tablet Take 1 tablet (5 mg total) by mouth daily. 12/30/21  Yes Luna Salinas, MD  silodosin  (RAPAFLO ) 8 MG CAPS capsule Take 1 capsule (8 mg total) by mouth daily with breakfast. 12/10/23  Yes Vaillancourt, Samantha, PA-C  spironolactone  (ALDACTONE ) 25 MG tablet Take 12.5 mg by mouth daily. 01/04/24  Yes [provider]  testosterone  (ANDROGEL ) 50 MG/5GM (1%) GEL Place 5 g onto the skin daily. Please confirm with your cardiologist before resuming 12/29/21  Yes Amin, Sumayya, MD  Zinc Gluconate 30 MG TABS Take 30 mg by mouth daily.   Yes [provider]  ciprofloxacin  (CIPRO ) 250 MG tablet Take 250 mg by mouth 2 (two) times daily. Patient not taking: Reported on 03/14/2024 02/24/24   [provider]  citalopram (CELEXA) 20 MG tablet Take 20 mg by mouth daily. Patient not taking: Reported on 03/14/2024 02/03/24   [provider]  metFORMIN (GLUCOPHAGE) 1000 MG tablet Take 1,000 mg by mouth 2 (two) times daily. Patient not taking: Reported on 03/14/2024 11/30/23   [provider]  omega-3 acid ethyl esters (LOVAZA ) 1 g capsule Take 2 capsules (2 g total) by mouth 2 (two) times daily with a meal. Patient not taking: Reported on 03/14/2024 12/29/21   Luna Salinas, MD  ONE TOUCH ULTRA TEST test strip USE 1 STRIP 3 TIMES A DAY TO CHECK BLOOD GLUCOSE LEVELS 09/24/18   [provider]  predniSONE (DELTASONE) 20 MG tablet Take 20 mg by mouth daily with breakfast. Patient not taking: Reported on 03/14/2024 02/29/24   [provider]    Family History  Problem Relation Age of Onset   Hematuria Father     Kidney disease Neg Hx    Prostate cancer Neg Hx      Social History   Tobacco Use   Smoking status: Every Day    Types: Cigars   Smokeless tobacco: Never   Tobacco comments:    2-3 cigars per day  Vaping Use   Vaping status: Never Used  Substance Use Topics   Alcohol use: No    Alcohol/week: 0.0 standard drinks of alcohol   Drug use: No    Allergies as of 03/14/2024 - Review Complete 03/14/2024  Allergen Reaction Noted   Citalopram Nausea And Vomiting 03/14/2024   Sulfamethoxazole -trimethoprim   06/05/2021   Trimethoprim  Other (See Comments) 05/05/2022    Review of Systems:  All systems reviewed and negative except where noted in HPI.   Physical Exam:  Vital signs in last 24 hours: Temp:  [98.5 F (36.9 C)-98.8 F (37.1 C)] 98.8 F (37.1 C) (06/04 0415) Pulse Rate:  [68-85] 70 (06/04 0415) Resp:  [18-22] 18 (06/03 1602) BP: (105-148)/(68-88) 121/76 (06/04 0415) SpO2:  [95 %-99 %] 95 % (06/04 0415) Weight:  [90.7 kg] 90.7 kg (06/03 1154) Last BM Date : 03/14/24 (per patient) General:   Pleasant, cooperative in NAD Head:  Normocephalic and atraumatic. Eyes:   Positive icterus.   Conjunctiva yellow. PERRLA. Ears:  Normal auditory acuity. Neck:  Supple; no masses or thyroidomegaly Lungs: Respirations even and unlabored. Lungs clear to auscultation bilaterally.   No wheezes, crackles, or rhonchi.  Heart:  Regular rate and rhythm;  Without murmur, clicks, rubs or gallops Abdomen:  Soft, nondistended, nontender. Normal bowel sounds. No appreciable masses or hepatomegaly.  No rebound or guarding.  Rectal:  Not performed. Msk:  Symmetrical without gross deformities.   Extremities:  Without edema, cyanosis or clubbing. Neurologic:  Alert and oriented x3;  grossly normal neurologically. Skin:  Intact without significant lesions or rashes.  Positive jaundice Cervical Nodes:  No significant cervical adenopathy. Psych:  Alert and cooperative. Normal affect.  LAB  RESULTS: Recent Labs    03/14/24 1159 03/15/24 0433  WBC 9.0 8.3  HGB 15.2 13.7  HCT 46.7 41.9  PLT 191 176   BMET Recent Labs    03/14/24 1159 03/15/24 0433  NA 135 134*  K 3.8 3.7  CL 97* 103  CO2 25 22  GLUCOSE 146* 77  BUN 23 18  CREATININE 0.75 0.57*  CALCIUM  9.1 8.7*   LFT Recent Labs    03/15/24 0433  PROT 6.4*  ALBUMIN 2.8*  AST 167*  ALT 397*  ALKPHOS 549*  BILITOT 17.2*   PT/INR No results for input(s): "LABPROT", "INR" in the last 72 hours.  STUDIES: MR ABDOMEN MRCP W WO CONTAST Result Date: 03/14/2024 CLINICAL DATA:  Further evaluation of intrahepatic bile duct dilation. Nausea. EXAM: MRI ABDOMEN WITHOUT AND WITH CONTRAST (INCLUDING MRCP) TECHNIQUE: Multiplanar multisequence MR imaging of the abdomen was performed both before and after the administration of intravenous contrast. Heavily T2-weighted images of the biliary and pancreatic ducts were obtained. Post-processing was applied at the acquisition scanner with concurrent physician supervision which includes 3D reconstructions, MIPs, volume rendered images and/or shaded surface rendering. CONTRAST:  10mL GADAVIST  GADOBUTROL  1 MMOL/ML IV SOLN COMPARISON:  CT abdomen and pelvis dated 03/10/2024 FINDINGS: Lower chest: No acute findings. Hepatobiliary: Atrophic left hepatic lobe. Moderate intrahepatic bile duct dilation with abrupt tapering at the level of the hepatic hilum (14:39). The common bile duct is nondilated, also demonstrating tapering at the hepatic hilum. No discrete filling defect, obstructing lesion, or signal abnormality. Underdistended gallbladder. Pancreas: No mass, inflammatory changes, or other parenchymal abnormality identified. Spleen: 2.2 x 1.8 cm mildly T2 hyperintense, enhancing focus along the medial posterior spleen (9:15) is subtly seen in retrospect on prior CT examinations and only minimally increased in size, likely benign such as a venous malformation or hamartoma. Adrenals/Urinary  Tract: No adrenal nodules. No suspicious renal masses identified. No evidence of hydronephrosis. Bilateral simple cysts. No specific follow-up imaging recommended. Stomach/Bowel: Colonic diverticulosis without acute diverticulitis. Vascular/Lymphatic: No pathologically enlarged lymph nodes identified. No abdominal aortic aneurysm demonstrated. Aortic atherosclerosis. Other:  None. Musculoskeletal: No suspicious bone lesions identified. IMPRESSION: 1. Moderate intrahepatic bile duct dilation with abrupt tapering at the level of the  hepatic hilum. No discrete filling defect, obstructing lesion, or signal abnormality. Findings may be secondary to benign stricture or cholangiocarcinoma. Recommend consultation to gastroenterology. 2. Colonic diverticulosis without acute diverticulitis. 3. A 2.2 cm mildly T2 hyperintense, enhancing focus along the medial posterior spleen is subtly seen in retrospect on prior CT examinations and only minimally increased in size, likely benign such as a venous malformation or hamartoma. 4. Aortic Atherosclerosis (ICD10-I70.0). Electronically Signed   By: Limin  Xu M.D.   On: 03/14/2024 15:45   MR 3D Recon At Scanner Result Date: 03/14/2024 CLINICAL DATA:  Further evaluation of intrahepatic bile duct dilation. Nausea. EXAM: MRI ABDOMEN WITHOUT AND WITH CONTRAST (INCLUDING MRCP) TECHNIQUE: Multiplanar multisequence MR imaging of the abdomen was performed both before and after the administration of intravenous contrast. Heavily T2-weighted images of the biliary and pancreatic ducts were obtained. Post-processing was applied at the acquisition scanner with concurrent physician supervision which includes 3D reconstructions, MIPs, volume rendered images and/or shaded surface rendering. CONTRAST:  10mL GADAVIST  GADOBUTROL  1 MMOL/ML IV SOLN COMPARISON:  CT abdomen and pelvis dated 03/10/2024 FINDINGS: Lower chest: No acute findings. Hepatobiliary: Atrophic left hepatic lobe. Moderate intrahepatic  bile duct dilation with abrupt tapering at the level of the hepatic hilum (14:39). The common bile duct is nondilated, also demonstrating tapering at the hepatic hilum. No discrete filling defect, obstructing lesion, or signal abnormality. Underdistended gallbladder. Pancreas: No mass, inflammatory changes, or other parenchymal abnormality identified. Spleen: 2.2 x 1.8 cm mildly T2 hyperintense, enhancing focus along the medial posterior spleen (9:15) is subtly seen in retrospect on prior CT examinations and only minimally increased in size, likely benign such as a venous malformation or hamartoma. Adrenals/Urinary Tract: No adrenal nodules. No suspicious renal masses identified. No evidence of hydronephrosis. Bilateral simple cysts. No specific follow-up imaging recommended. Stomach/Bowel: Colonic diverticulosis without acute diverticulitis. Vascular/Lymphatic: No pathologically enlarged lymph nodes identified. No abdominal aortic aneurysm demonstrated. Aortic atherosclerosis. Other:  None. Musculoskeletal: No suspicious bone lesions identified. IMPRESSION: 1. Moderate intrahepatic bile duct dilation with abrupt tapering at the level of the hepatic hilum. No discrete filling defect, obstructing lesion, or signal abnormality. Findings may be secondary to benign stricture or cholangiocarcinoma. Recommend consultation to gastroenterology. 2. Colonic diverticulosis without acute diverticulitis. 3. A 2.2 cm mildly T2 hyperintense, enhancing focus along the medial posterior spleen is subtly seen in retrospect on prior CT examinations and only minimally increased in size, likely benign such as a venous malformation or hamartoma. 4. Aortic Atherosclerosis (ICD10-I70.0). Electronically Signed   By: Limin  Xu M.D.   On: 03/14/2024 15:45      Impression / Plan:   Assessment: Principal Problem:   Painless jaundice   Casey Peters is a 78 y.o. y/o male with jaundice and dilated intrahepatic ducts.  The patient  has had CEA 19-9 sent off already.  Plan:  The patient will be set up for an ERCP for today.  The patient will have brushings of the strictured area and a stent placed if possible to help drainage of the dilated intrahepatic ducts.  The patient has been explained the risks including procedure failure, pancreatitis, bleeding, perforation and death.  The patient has been given ample time to ask questions regarding the procedure and those questions have been answered.  We will proceed with the ERCP.  Thank you for involving me in the care of this patient.      LOS: 1 day   Marnee Sink, MD, MD. Sylvan Evener 03/15/2024, 7:23 AM,  Pager [phone] 7am-5pm  Check AMION for 5pm -7am coverage and on weekends   Note: This dictation was prepared with Dragon dictation along with smaller phrase technology. Any transcriptional errors that result from this process are unintentional.

## 2024-03-16 ENCOUNTER — Encounter: Payer: Self-pay | Admitting: Internal Medicine

## 2024-03-16 ENCOUNTER — Inpatient Hospital Stay: Admitting: Radiology

## 2024-03-16 DIAGNOSIS — R17 Unspecified jaundice: Secondary | ICD-10-CM | POA: Diagnosis not present

## 2024-03-16 HISTORY — PX: IR BILIARY DRAIN PLACEMENT WITH CHOLANGIOGRAM: IMG6043

## 2024-03-16 LAB — COMPREHENSIVE METABOLIC PANEL WITH GFR
ALT: 345 U/L — ABNORMAL HIGH (ref 0–44)
AST: 145 U/L — ABNORMAL HIGH (ref 15–41)
Albumin: 2.8 g/dL — ABNORMAL LOW (ref 3.5–5.0)
Alkaline Phosphatase: 559 U/L — ABNORMAL HIGH (ref 38–126)
Anion gap: 11 (ref 5–15)
BUN: 19 mg/dL (ref 8–23)
CO2: 23 mmol/L (ref 22–32)
Calcium: 9 mg/dL (ref 8.9–10.3)
Chloride: 98 mmol/L (ref 98–111)
Creatinine, Ser: 0.66 mg/dL (ref 0.61–1.24)
GFR, Estimated: >60 mL/min (ref >60)
Glucose, Bld: 244 mg/dL — ABNORMAL HIGH (ref 70–99)
Potassium: 4.2 mmol/L (ref 3.5–5.1)
Sodium: 132 mmol/L — ABNORMAL LOW (ref 135–145)
Total Bilirubin: 19.9 mg/dL (ref 0.0–1.2)
Total Protein: 6.5 g/dL (ref 6.5–8.1)

## 2024-03-16 LAB — PROTIME-INR
INR: 1 (ref 0.8–1.2)
Prothrombin Time: 13.1 s (ref 11.4–15.2)

## 2024-03-16 LAB — AFP TUMOR MARKER: AFP, Serum, Tumor Marker: <1.8 ng/mL (ref 0.0–8.4)

## 2024-03-16 LAB — GLUCOSE, CAPILLARY
Glucose-Capillary: 221 mg/dL — ABNORMAL HIGH (ref 70–99)
Glucose-Capillary: 155 mg/dL — ABNORMAL HIGH (ref 70–99)
Glucose-Capillary: 136 mg/dL — ABNORMAL HIGH (ref 70–99)
Glucose-Capillary: 156 mg/dL — ABNORMAL HIGH (ref 70–99)
Glucose-Capillary: 201 mg/dL — ABNORMAL HIGH (ref 70–99)

## 2024-03-16 MED ORDER — ONDANSETRON HCL 4 MG/2ML IJ SOLN
INTRAMUSCULAR | Status: AC | PRN
Start: 2024-03-16 — End: 2024-03-16
  Administered 2024-03-16: 4 mg via INTRAVENOUS

## 2024-03-16 MED ORDER — MIDAZOLAM HCL 5 MG/5ML IJ SOLN
INTRAMUSCULAR | Status: AC | PRN
  Administered 2024-03-16: .5 mg via INTRAVENOUS
  Administered 2024-03-16: 1 mg via INTRAVENOUS

## 2024-03-16 MED ORDER — MIDAZOLAM HCL 2 MG/2ML IJ SOLN
INTRAMUSCULAR | Status: AC
  Filled 2024-03-16: qty 2

## 2024-03-16 MED ORDER — FENTANYL CITRATE (PF) 100 MCG/2ML IJ SOLN
INTRAMUSCULAR | Status: AC
  Filled 2024-03-16: qty 2

## 2024-03-16 MED ORDER — FENTANYL CITRATE (PF) 100 MCG/2ML IJ SOLN
INTRAMUSCULAR | Status: AC | PRN
  Administered 2024-03-16: 25 ug via INTRAVENOUS
  Administered 2024-03-16: 50 ug via INTRAVENOUS
  Administered 2024-03-16: 25 ug via INTRAVENOUS

## 2024-03-16 MED ORDER — ALUM & MAG HYDROXIDE-SIMETH 200-200-20 MG/5ML PO SUSP
30.0000 mL | Freq: Once | ORAL | Status: AC
  Administered 2024-03-16: 30 mL via ORAL
  Filled 2024-03-16: qty 30

## 2024-03-16 MED ORDER — LIDOCAINE HCL 1 % IJ SOLN
10.0000 mL | Freq: Once | INTRAMUSCULAR | Status: AC
  Administered 2024-03-16: 10 mL via INTRADERMAL

## 2024-03-16 MED ORDER — SODIUM CHLORIDE 0.9 % IV SOLN
INTRAVENOUS | Status: AC | PRN
  Administered 2024-03-16: 2 g via INTRAVENOUS

## 2024-03-16 MED ORDER — LACTATED RINGERS IV SOLN: INTRAVENOUS | Status: AC

## 2024-03-16 MED ORDER — SILODOSIN 8 MG PO CAPS
8.0000 mg | ORAL_CAPSULE | Freq: Every day | ORAL | Status: DC
  Administered 2024-03-17: 8 mg via ORAL
  Filled 2024-03-16 (×2): qty 1

## 2024-03-16 MED ORDER — ONDANSETRON HCL 4 MG/2ML IJ SOLN
INTRAMUSCULAR | Status: AC
  Filled 2024-03-16: qty 2

## 2024-03-16 MED ORDER — SILODOSIN 8 MG PO CAPS: 8.0000 mg | ORAL_CAPSULE | Freq: Every day | ORAL | Status: DC

## 2024-03-16 MED ORDER — LIDOCAINE HCL 1 % IJ SOLN
INTRAMUSCULAR | Status: AC
  Filled 2024-03-16: qty 20

## 2024-03-16 MED ORDER — IOHEXOL 300 MG/ML  SOLN
25.0000 mL | Freq: Once | INTRAMUSCULAR | Status: AC | PRN
  Administered 2024-03-16: 15 mL

## 2024-03-16 MED ORDER — MIDAZOLAM HCL 2 MG/2ML IJ SOLN
INTRAMUSCULAR | Status: AC | PRN
  Administered 2024-03-16: .5 mg via INTRAVENOUS

## 2024-03-16 MED ORDER — TESTOSTERONE 50 MG/5GM (1%) TD GEL
5.0000 g | Freq: Every day | TRANSDERMAL | Status: DC
  Administered 2024-03-16 – 2024-03-17 (×2): 5 g via TRANSDERMAL
  Filled 2024-03-16 (×3): qty 5

## 2024-03-16 MED ORDER — SODIUM CHLORIDE 0.9 % IV SOLN
2.0000 g | INTRAVENOUS | Status: AC
  Filled 2024-03-16 (×2): qty 20

## 2024-03-16 NOTE — Plan of Care (Signed)
 Casey Peters

## 2024-03-16 NOTE — Consult Note (Signed)
 Chief Complaint: Patient was seen in consultation today for biliary obstruction  Referring Physician(s): Dr. Marnee Sink  Supervising Physician:  Elene Griffes, MD  Patient Status: [facility] - In-pt  History of Present Illness: Casey Peters is a 79 y.o. male with past medical histoyr of CHF, DM, HLD, HTN who presented to [facility] ED at the referral of his PCP after he was found to have elevated LFTs with Tbili 16.1.  CT imaging showed biliary dilatation and subsequent MRCP 6/3 showed: 1. Moderate intrahepatic bile duct dilation with abrupt tapering at the level of the hepatic hilum. No discrete filling defect, obstructing lesion, or signal abnormality. Findings may be secondary to benign stricture or cholangiocarcinoma. Recommend consultation to gastroenterology. 2. Colonic diverticulosis without acute diverticulitis. 3. A 2.2 cm mildly T2 hyperintense, enhancing focus along the medial posterior spleen is subtly seen in retrospect on prior CT examinations and only minimally increased in size, likely benign such as a venous malformation or hamartoma. 4. Aortic Atherosclerosis (ICD10-I70.0).  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 Bile duct brushings were obtained and stent placed in the common bile duct.  Unfortunately, patient with further elevation of his Tbili this AM from 17.2  19.9.  IR consulted for biliary drain placement.  Case reviewed and approved by Dr. Burna Carrier.   Past Medical History:  Diagnosis Date   Cardiomegaly    CHF (congestive heart failure) (HCC)    Constipation    Diabetic polyneuropathy (HCC)    Dilated idiopathic cardiomyopathy (HCC)    Erectile dysfunction    HLD (hyperlipidemia)    HTN (hypertension)    Low testosterone     Palpitations    Pneumonia    T2DM (type 2 diabetes mellitus) (HCC)     Past Surgical History:  Procedure Laterality Date    CORONARY/GRAFT ACUTE MI REVASCULARIZATION N/A 12/27/2021   Procedure: Coronary/Graft Acute MI Revascularization;  Surgeon: Sammy Crisp, MD;  Location: [facility] INVASIVE CV LAB;  Service: Cardiovascular;  Laterality: N/A;   CYSTOSCOPY WITH URETHRAL DILATATION N/A 06/17/2021   Procedure: CYSTOSCOPY WITH URETHRAL DILATATION;  Surgeon: Geraline Knapp, MD;  Location: [facility] ORS;  Service: Urology;  Laterality: N/A;  Optilume Balloon   ERCP N/A 03/15/2024   Procedure: ERCP, WITH INTERVENTION IF INDICATED;  Surgeon: Marnee Sink, MD;  Location: [facility] ENDOSCOPY;  Service: Endoscopy;  Laterality: N/A;   HERNIA REPAIR  1998   LEFT HEART CATH AND CORONARY ANGIOGRAPHY N/A 12/27/2021   Procedure: LEFT HEART CATH AND CORONARY ANGIOGRAPHY;  Surgeon: Sammy Crisp, MD;  Location: [facility] INVASIVE CV LAB;  Service: Cardiovascular;  Laterality: N/A;   PILONIDAL CYST EXCISION     TONSILLECTOMY     URETHRA SURGERY  1995    Allergies: Citalopram, Sulfamethoxazole -trimethoprim , and Trimethoprim   Medications: Prior to Admission medications   Medication Sig Start Date End Date Taking? Authorizing Provider  ascorbic acid (VITAMIN C) 500 MG tablet Take 500 mg by mouth daily.   Yes [provider]  aspirin  EC 81 MG tablet Take 81 mg by mouth daily.   Yes [provider]  cholecalciferol (VITAMIN D3) 25 MCG (1000 UNIT) tablet Take 1,000 Units by mouth daily.   Yes [provider]  furosemide (LASIX) 40 MG tablet Take 40 mg by mouth daily.   Yes [provider]  glimepiride (AMARYL) 1 MG tablet Take 1 mg by mouth daily with breakfast. 03/03/24 03/03/25 Yes [provider]  JANUVIA 50 MG tablet Take 1 tablet by mouth daily. 02/29/24 02/28/25 Yes [provider]  nitrofurantoin , macrocrystal-monohydrate, (MACROBID ) 100 MG capsule TAKE 1 CAPSULE BY MOUTH ONCE DAILY 11/18/23  Yes Vaillancourt, Samantha, PA-C  rosuvastatin  (CRESTOR ) 5 MG tablet Take 1 tablet (5 mg total) by mouth  daily. 12/30/21  Yes Luna Salinas, MD  silodosin  (RAPAFLO ) 8 MG CAPS capsule Take 1 capsule (8 mg total) by mouth daily with breakfast. 12/10/23  Yes Vaillancourt, Samantha, PA-C  spironolactone  (ALDACTONE ) 25 MG tablet Take 12.5 mg by mouth daily. 01/04/24  Yes [provider]  testosterone  (ANDROGEL ) 50 MG/5GM (1%) GEL Place 5 g onto the skin daily. Please confirm with your cardiologist before resuming 12/29/21  Yes Amin, Sumayya, MD  Zinc Gluconate 30 MG TABS Take 30 mg by mouth daily.   Yes [provider]  citalopram (CELEXA) 20 MG tablet Take 20 mg by mouth daily. Patient not taking: Reported on 03/14/2024 02/03/24   [provider]  metFORMIN (GLUCOPHAGE) 1000 MG tablet Take 1,000 mg by mouth 2 (two) times daily. Patient not taking: Reported on 03/14/2024 11/30/23   [provider]     Family History  Problem Relation Age of Onset   Hematuria Father    Kidney disease Neg Hx    Prostate cancer Neg Hx     Social History   Socioeconomic History   Marital status: Married    Spouse name: Not on file   Number of children: Not on file   Years of education: Not on file   Highest education level: Not on file  Occupational History   Not on file  Tobacco Use   Smoking status: Every Day    Types: Cigars   Smokeless tobacco: Never   Tobacco comments:    2-3 cigars per day  Vaping Use   Vaping status: Never Used  Substance and Sexual Activity   Alcohol use: No    Alcohol/week: 0.0 standard drinks of alcohol   Drug use: No   Sexual activity: Yes    Birth control/protection: None  Other Topics Concern   Not on file  Social History Narrative   Not on file   Social Drivers of Health   Financial Resource Strain: Not on file  Food Insecurity: No Food Insecurity (03/14/2024)   Hunger Vital Sign    Worried About Running Out of Food in the Last Year: Never true    Ran Out of Food in the Last Year: Never true  Transportation Needs: No Transportation Needs  (03/14/2024)   PRAPARE - Administrator, Civil Service (Medical): No    Lack of Transportation (Non-Medical): No  Physical Activity: Not on file  Stress: Not on file  Social Connections: Socially Integrated (03/14/2024)   Social Connection and Isolation Panel [NHANES]    Frequency of Communication with Friends and Family: More than three times a week    Frequency of Social Gatherings with Friends and Family: Twice a week    Attends Religious Services: More than 4 times per year    Active Member of [employer] or Organizations: Yes    Attends Engineer, structural: More than 4 times per year    Marital Status: Married     Review of Systems: A 12 point ROS discussed and pertinent positives are indicated in the HPI above.  All other systems are negative.  Review of Systems  Constitutional:  Negative for fatigue and fever.  Respiratory:  Negative for cough  and shortness of breath.   Cardiovascular:  Negative for chest pain.  Gastrointestinal:  Positive for abdominal pain (occasional, intermittent). Negative for nausea.  Musculoskeletal:  Negative for back pain.  Skin:  Positive for color change (jaundice).  Psychiatric/Behavioral:  Negative for behavioral problems and confusion.     Vital Signs: BP (!) 149/81 (BP Location: Right Arm)   Pulse 89   Temp 99.2 F (37.3 C)   Resp 16   Ht 5\' 11"  (1.803 m)   Wt 199 lb 15.3 oz (90.7 kg)   SpO2 97%   BMI 27.89 kg/m   Physical Exam Vitals and nursing note reviewed.  Constitutional:      General: He is not in acute distress.    Appearance: Normal appearance. He is not ill-appearing.  HENT:     Mouth/Throat:     Mouth: Mucous membranes are moist.     Pharynx: Oropharynx is clear.  Eyes:     General: Scleral icterus present.  Cardiovascular:     Rate and Rhythm: Normal rate and regular rhythm.  Pulmonary:     Effort: Pulmonary effort is normal.     Breath sounds: Normal breath sounds.  Abdominal:     General: Abdomen  is flat. There is no distension.     Palpations: Abdomen is soft.  Skin:    General: Skin is warm and dry.     Coloration: Skin is jaundiced.  Neurological:     General: No focal deficit present.     Mental Status: He is alert and oriented to person, place, and time. Mental status is at baseline.  Psychiatric:        Mood and Affect: Mood normal.        Behavior: Behavior normal.        Thought Content: Thought content normal.        Judgment: Judgment normal.      MD Evaluation Airway: WNL Heart: WNL Abdomen: WNL Chest/ Lungs: WNL ASA  Classification: 3 Mallampati/Airway Score: Two   Imaging: DG C-Arm 1-60 Min-No Report Result Date: 03/15/2024 Fluoroscopy was utilized by the requesting physician.  No radiographic interpretation.   MR ABDOMEN MRCP W WO CONTAST Result Date: 03/14/2024 CLINICAL DATA:  Further evaluation of intrahepatic bile duct dilation. Nausea. EXAM: MRI ABDOMEN WITHOUT AND WITH CONTRAST (INCLUDING MRCP) TECHNIQUE: Multiplanar multisequence MR imaging of the abdomen was performed both before and after the administration of intravenous contrast. Heavily T2-weighted images of the biliary and pancreatic ducts were obtained. Post-processing was applied at the acquisition scanner with concurrent physician supervision which includes 3D reconstructions, MIPs, volume rendered images and/or shaded surface rendering. CONTRAST:  10mL GADAVIST  GADOBUTROL  1 MMOL/ML IV SOLN COMPARISON:  CT abdomen and pelvis dated 03/10/2024 FINDINGS: Lower chest: No acute findings. Hepatobiliary: Atrophic left hepatic lobe. Moderate intrahepatic bile duct dilation with abrupt tapering at the level of the hepatic hilum (14:39). The common bile duct is nondilated, also demonstrating tapering at the hepatic hilum. No discrete filling defect, obstructing lesion, or signal abnormality. Underdistended gallbladder. Pancreas: No mass, inflammatory changes, or other parenchymal abnormality identified. Spleen:  2.2 x 1.8 cm mildly T2 hyperintense, enhancing focus along the medial posterior spleen (9:15) is subtly seen in retrospect on prior CT examinations and only minimally increased in size, likely benign such as a venous malformation or hamartoma. Adrenals/Urinary Tract: No adrenal nodules. No suspicious renal masses identified. No evidence of hydronephrosis. Bilateral simple cysts. No specific follow-up imaging recommended. Stomach/Bowel: Colonic diverticulosis without acute diverticulitis. Vascular/Lymphatic: No  pathologically enlarged lymph nodes identified. No abdominal aortic aneurysm demonstrated. Aortic atherosclerosis. Other:  None. Musculoskeletal: No suspicious bone lesions identified. IMPRESSION: 1. Moderate intrahepatic bile duct dilation with abrupt tapering at the level of the hepatic hilum. No discrete filling defect, obstructing lesion, or signal abnormality. Findings may be secondary to benign stricture or cholangiocarcinoma. Recommend consultation to gastroenterology. 2. Colonic diverticulosis without acute diverticulitis. 3. A 2.2 cm mildly T2 hyperintense, enhancing focus along the medial posterior spleen is subtly seen in retrospect on prior CT examinations and only minimally increased in size, likely benign such as a venous malformation or hamartoma. 4. Aortic Atherosclerosis (ICD10-I70.0). Electronically Signed   By: Limin  Xu M.D.   On: 03/14/2024 15:45   MR 3D Recon At Scanner Result Date: 03/14/2024 CLINICAL DATA:  Further evaluation of intrahepatic bile duct dilation. Nausea. EXAM: MRI ABDOMEN WITHOUT AND WITH CONTRAST (INCLUDING MRCP) TECHNIQUE: Multiplanar multisequence MR imaging of the abdomen was performed both before and after the administration of intravenous contrast. Heavily T2-weighted images of the biliary and pancreatic ducts were obtained. Post-processing was applied at the acquisition scanner with concurrent physician supervision which includes 3D reconstructions, MIPs, volume  rendered images and/or shaded surface rendering. CONTRAST:  10mL GADAVIST  GADOBUTROL  1 MMOL/ML IV SOLN COMPARISON:  CT abdomen and pelvis dated 03/10/2024 FINDINGS: Lower chest: No acute findings. Hepatobiliary: Atrophic left hepatic lobe. Moderate intrahepatic bile duct dilation with abrupt tapering at the level of the hepatic hilum (14:39). The common bile duct is nondilated, also demonstrating tapering at the hepatic hilum. No discrete filling defect, obstructing lesion, or signal abnormality. Underdistended gallbladder. Pancreas: No mass, inflammatory changes, or other parenchymal abnormality identified. Spleen: 2.2 x 1.8 cm mildly T2 hyperintense, enhancing focus along the medial posterior spleen (9:15) is subtly seen in retrospect on prior CT examinations and only minimally increased in size, likely benign such as a venous malformation or hamartoma. Adrenals/Urinary Tract: No adrenal nodules. No suspicious renal masses identified. No evidence of hydronephrosis. Bilateral simple cysts. No specific follow-up imaging recommended. Stomach/Bowel: Colonic diverticulosis without acute diverticulitis. Vascular/Lymphatic: No pathologically enlarged lymph nodes identified. No abdominal aortic aneurysm demonstrated. Aortic atherosclerosis. Other:  None. Musculoskeletal: No suspicious bone lesions identified. IMPRESSION: 1. Moderate intrahepatic bile duct dilation with abrupt tapering at the level of the hepatic hilum. No discrete filling defect, obstructing lesion, or signal abnormality. Findings may be secondary to benign stricture or cholangiocarcinoma. Recommend consultation to gastroenterology. 2. Colonic diverticulosis without acute diverticulitis. 3. A 2.2 cm mildly T2 hyperintense, enhancing focus along the medial posterior spleen is subtly seen in retrospect on prior CT examinations and only minimally increased in size, likely benign such as a venous malformation or hamartoma. 4. Aortic Atherosclerosis  (ICD10-I70.0). Electronically Signed   By: Limin  Xu M.D.   On: 03/14/2024 15:45   CT ABDOMEN PELVIS W CONTRAST Result Date: 03/10/2024 EXAMINATION: CT ABDOMEN PELVIS W CONTRAST CLINICAL INDICATION: Male, 78 years old. Nausea and vomiting TECHNIQUE: Axial CT of the abdomen and pelvis with 100 cc Omnipaque  300 intravenous contrast. Multiplanar reformations provided. Unless otherwise specified, incidental thyroid, adrenal, renal lesions do not require dedicated imaging follow up. Additionally, any mentioned pulmonary nodules do not require dedicated imaging follow-up based on the Fleischner guidelines unless otherwise specified. Coronary calcifications are not identified unless otherwise specified. COMPARISON: 07/19/2015 FINDINGS: There are minimal atelectatic changes in the lung bases. The heart is normal in size. There are coronary calcifications. There is new mild intrahepatic biliary ductal dilatation predominantly affecting the left lobe. The gallbladder is  normal. The spleen is normal. The pancreas appears normal. The adrenals are normal. The right kidney is normal. Left renal cyst is seen. Abdominal aorta is normal in caliber. Scattered atherosclerotic changes are present. Bladder is normal. The prostate is enlarged. Large and small bowel loops are otherwise within normal limits. There is no free fluid or pathologic lymphadenopathy by size criteria. There are degenerative changes of the spine and bony pelvis. IMPRESSION: New mild intrahepatic biliary ductal dilatation predominantly affecting the left lobe. Recommend further evaluation with multiphase MRI of the abdomen to include MRCP sequencing. DOSE REDUCTION: This exam was performed according to our departmental dose-optimization program which includes automated exposure control, adjustment of the mA and/or kV according to patient size and/or use of iterative reconstruction technique. Electronically signed by: [place] Engel MD 03/10/2024 05:30 PM EDT RP  Workstation: [id number]    Labs:  CBC: Recent Labs    03/14/24 1159 03/15/24 0433  WBC 9.0 8.3  HGB 15.2 13.7  HCT 46.7 41.9  PLT 191 176    COAGS: Recent Labs    03/16/24 0923  INR 1.0    BMP: Recent Labs    03/14/24 1159 03/15/24 0433 03/16/24 0341  NA 135 134* 132*  K 3.8 3.7 4.2  CL 97* 103 98  CO2 25 22 23   GLUCOSE 146* 77 244*  BUN 23 18 19   CALCIUM  9.1 8.7* 9.0  CREATININE 0.75 0.57* 0.66  GFRNONAA >60 >60 >60    LIVER FUNCTION TESTS: Recent Labs    03/14/24 1159 03/15/24 0433 03/16/24 0341  BILITOT 17.1* 17.2* 19.9*  AST 208* 167* 145*  ALT 520* 397* 345*  ALKPHOS 632* 549* 559*  PROT 7.2 6.4* 6.5  ALBUMIN 3.1* 2.8* 2.8*    TUMOR MARKERS: No results for input(s): "AFPTM", "CEA", "CA199", "CHROMGRNA" in the last 8760 hours.  Assessment and Plan: Biliary obstruction Patient s/p ERCP with stent placement and bile duct brushings. Cytology pending.  Further elevated Tbili this AM, now 19.9. AFP <1.8 CA 19-9 pending.  IR consulted for cholangiogram with internal/external biliary drain placement.  Discussed with patient who is agreeable.  Plan to proceed in IR this afternoon.  Per patient, he has been NPO this AM and did not get a breakfast tray.  INR 1.0.   Risks and benefits of drain placement discussed with the patient including, but not limited to bleeding, infection which may lead to sepsis or even death and damage to adjacent structures.  This interventional procedure involves the use of X-rays and because of the nature of the planned procedure, it is possible that we will have prolonged use of X-ray fluoroscopy.  Potential radiation risks to you include (but are not limited to) the following: - A slightly elevated risk for cancer  several years later in life. This risk is typically less than 0.5% percent. This risk is low in comparison to the normal incidence of human cancer, which is 33% for women and 50% for men according to the  [employer] Society. - Radiation induced injury can include skin redness, resembling a rash, tissue breakdown / ulcers and hair loss (which can be temporary or permanent).   The likelihood of either of these occurring depends on the difficulty of the procedure and whether you are sensitive to radiation due to previous procedures, disease, or genetic conditions.   IF your procedure requires a prolonged use of radiation, you will be notified and given written instructions for further action.  It is your responsibility to monitor the  irradiated area for the 2 weeks following the procedure and to notify your physician if you are concerned that you have suffered a radiation induced injury.    All of the patient's questions were answered, patient is agreeable to proceed.  Consent signed and in chart.   Thank you for this interesting consult.  I greatly enjoyed meeting Casey Peters and look forward to participating in their care.  A copy of this report was sent to the requesting provider on this date.  Electronically Signed: Afreen Siebels Sue-Ellen Olof Marcil, PA 03/16/2024, 12:06 PM   I spent a total of 40 Minutes    in face to face in clinical consultation, greater than 50% of which was counseling/coordinating care for biliary obstruction.

## 2024-03-16 NOTE — Progress Notes (Signed)
 Progress Note    Casey Peters  ZOX:[id number] DOB: 04/18/1946  DOA: 03/14/2024 PCP: Little Riff, MD      Brief Narrative:    Medical records reviewed and are as summarized below:  Casey Peters is a 78 y.o. male with medical history significant of dilated cardiomyopathy, CAD RCA stent in 2023, HTN, HLD, IIDM, BPH with history of recurrent UTIs on suppressive antibiotics, who presented to the hospital because of abnormal labs (elevated liver enzymes) and CT abdomen findings.  Patient reported fatigue for several weeks and also complained of 8 pound weight loss in the 2 weeks preceding admission.  He complained of dark urine without any dysuria or urinary frequency.  He noticed yellowish discoloration of his eyes.  No abdominal pain or vomiting.     Workup revealed elevated liver enzymes with significant hyperbilirubinemia and intrahepatic biliary obstruction.    Assessment/Plan:   Principal Problem:   Painless jaundice   Body mass index is 27.89 kg/m.   Elevated liver enzymes, painless jaundice, intrahepatic biliary duct dilatation: S/p ERCP with stent placement on 03/15/2024.  Cytology is pending.  Plan for PTC biliary drain by IR today. Follow-up with gastroenterologist.   Abnormal urinalysis, history of recurrent UTIs: Patient is asymptomatic.  Urine culture growing gram-negative rods.  This may be asymptomatic bacteriuria.  Follow-up urine culture. Continue nitrofurantoin  (suppressive antibiotic)   CAD s/p RCA stent in 2023: Continue aspirin .  Statin on hold because of elevated liver enzymes.   Colonic diverticulosis without diverticulitis, 2.2 cm splenic lesion, aortic atherosclerosis noted on MRCP.  Outpatient follow-up   Comorbidities include BPH, hypertension, type II DM, hyperlipidemia Patient prefers to use testosterone  gel and silodosin  from home since these medicines are nonformulary in the hospital.    Diet Order             Diet  NPO time specified Except for: Sips with Meds  Diet effective now                            Consultants: Gastroenterologist Interventional radiologist  Procedures: S/p ERCP on 03/15/2024    Medications:    aspirin  EC  81 mg Oral QPM   insulin  aspart  0-15 Units Subcutaneous TID WC   linagliptin  5 mg Oral QPM   nitrofurantoin  (macrocrystal-monohydrate)  100 mg Oral Daily   silodosin   8 mg Oral Q breakfast   testosterone   5 g Transdermal Daily   Continuous Infusions:  cefTRIAXone (ROCEPHIN)  IV     lactated ringers 75 mL/hr at 03/16/24 1154     Anti-infectives (From admission, onward)    Start     Dose/Rate Route Frequency Ordered Stop   03/16/24 1245  cefTRIAXone (ROCEPHIN) 2 g in sodium chloride  0.9 % 100 mL IVPB        2 g 200 mL/hr over 30 Minutes Intravenous On call 03/16/24 1145 03/17/24 1245   03/15/24 1000  nitrofurantoin  (macrocrystal-monohydrate) (MACROBID ) capsule 100 mg        100 mg Oral Daily 03/14/24 1412                Family Communication/Anticipated D/C date and plan/Code Status   DVT prophylaxis: Place and maintain sequential compression device Start: 03/14/24 1614     Code Status: Full Code  Family Communication: Plan discussed with his wife at the bedside Disposition Plan: Plan to discharge home   Status is: Inpatient Remains inpatient appropriate  because: Workup for painless jaundice and elevated liver enzymes       Subjective:   Interval events noted.  He complains of "stomach pain".  He said he wants to have a shower.  He has no problems with balance, standing or ambulation.  His wife was at the bedside.  Objective:    Vitals:   03/15/24 1717 03/15/24 2103 03/16/24 0458 03/16/24 0729  BP: (!) 149/91 (!) 147/82 (!) 143/86 (!) 149/81  Pulse: 80 89 82 89  Resp: 18 18  16   Temp: 100.1 F (37.8 C) 98.2 F (36.8 C) 99.1 F (37.3 C) 99.2 F (37.3 C)  TempSrc: Oral     SpO2: 96% 95% 95% 97%  Weight:       Height:       No data found.   Intake/Output Summary (Last 24 hours) at 03/16/2024 1301 Last data filed at 03/16/2024 1201 Gross per 24 hour  Intake 640 ml  Output 1000 ml  Net -360 ml   Filed Weights   03/14/24 1154 03/15/24 1253  Weight: 90.7 kg 90.7 kg    Exam:  GEN: NAD SKIN: Warm and dry.  Jaundice EYES: Icteric.  No pallor ENT: MMM CV: RRR PULM: CTA B ABD: soft, ND, NT, +BS CNS: AAO x 3, non focal EXT: No edema or tenderness       Data Reviewed:   I have personally reviewed following labs and imaging studies:  Labs: Labs show the following:   Basic Metabolic Panel: Recent Labs  Lab 03/14/24 1159 03/15/24 0433 03/16/24 0341  NA 135 134* 132*  K 3.8 3.7 4.2  CL 97* 103 98  CO2 25 22 23   GLUCOSE 146* 77 244*  BUN 23 18 19   CREATININE 0.75 0.57* 0.66  CALCIUM  9.1 8.7* 9.0   GFR Estimated Creatinine Clearance: 90.6 mL/min (by C-G formula based on SCr of 0.66 mg/dL). Liver Function Tests: Recent Labs  Lab 03/14/24 1159 03/15/24 0433 03/16/24 0341  AST 208* 167* 145*  ALT 520* 397* 345*  ALKPHOS 632* 549* 559*  BILITOT 17.1* 17.2* 19.9*  PROT 7.2 6.4* 6.5  ALBUMIN 3.1* 2.8* 2.8*   Recent Labs  Lab 03/14/24 1159  LIPASE 25   No results for input(s): "AMMONIA" in the last 168 hours. Coagulation profile Recent Labs  Lab 03/16/24 0923  INR 1.0    CBC: Recent Labs  Lab 03/14/24 1159 03/15/24 0433  WBC 9.0 8.3  HGB 15.2 13.7  HCT 46.7 41.9  MCV 93.2 92.9  PLT 191 176   Cardiac Enzymes: No results for input(s): "CKTOTAL", "CKMB", "CKMBINDEX", "TROPONINI" in the last 168 hours. BNP (last 3 results) No results for input(s): "PROBNP" in the last 8760 hours. CBG: Recent Labs  Lab 03/15/24 1110 03/15/24 1718 03/15/24 2105 03/16/24 0727 03/16/24 1158  GLUCAP 77 148* 194* 221* 155*   D-Dimer: No results for input(s): "DDIMER" in the last 72 hours. Hgb A1c: Recent Labs    03/14/24 1159  HGBA1C 6.9*   Lipid Profile: No  results for input(s): "CHOL", "HDL", "LDLCALC", "TRIG", "CHOLHDL", "LDLDIRECT" in the last 72 hours. Thyroid function studies: No results for input(s): "TSH", "T4TOTAL", "T3FREE", "THYROIDAB" in the last 72 hours.  Invalid input(s): "FREET3" Anemia work up: No results for input(s): "VITAMINB12", "FOLATE", "FERRITIN", "TIBC", "IRON", "RETICCTPCT" in the last 72 hours. Sepsis Labs: Recent Labs  Lab 03/14/24 1159 03/15/24 0433  WBC 9.0 8.3    Microbiology Recent Results (from the past 240 hours)  Urine Culture (for pregnant,  neutropenic or urologic patients or patients with an indwelling urinary catheter)     Status: Abnormal (Preliminary result)   Collection Time: 03/14/24  5:13 PM   Specimen: Urine, Clean Catch  Result Value Ref Range Status   Specimen Description   Final    URINE, CLEAN CATCH Performed at [facility], [address], [place], [place] [address]    Special Requests   Final    NONE Performed at [facility], [address] [place], [place] [address]    Culture >=100,000 COLONIES/mL GRAM NEGATIVE RODS (A)  Final   Report Status PENDING  Incomplete    Procedures and diagnostic studies:  DG C-Arm 1-60 Min-No Report Result Date: 03/15/2024 Fluoroscopy was utilized by the requesting physician.  No radiographic interpretation.   MR ABDOMEN MRCP W WO CONTAST Result Date: 03/14/2024 CLINICAL DATA:  Further evaluation of intrahepatic bile duct dilation. Nausea. EXAM: MRI ABDOMEN WITHOUT AND WITH CONTRAST (INCLUDING MRCP) TECHNIQUE: Multiplanar multisequence MR imaging of the abdomen was performed both before and after the administration of intravenous contrast. Heavily T2-weighted images of the biliary and pancreatic ducts were obtained. Post-processing was applied at the acquisition scanner with concurrent physician supervision which includes 3D reconstructions, MIPs, volume rendered images and/or shaded surface rendering. CONTRAST:  10mL GADAVIST   GADOBUTROL  1 MMOL/ML IV SOLN COMPARISON:  CT abdomen and pelvis dated 03/10/2024 FINDINGS: Lower chest: No acute findings. Hepatobiliary: Atrophic left hepatic lobe. Moderate intrahepatic bile duct dilation with abrupt tapering at the level of the hepatic hilum (14:39). The common bile duct is nondilated, also demonstrating tapering at the hepatic hilum. No discrete filling defect, obstructing lesion, or signal abnormality. Underdistended gallbladder. Pancreas: No mass, inflammatory changes, or other parenchymal abnormality identified. Spleen: 2.2 x 1.8 cm mildly T2 hyperintense, enhancing focus along the medial posterior spleen (9:15) is subtly seen in retrospect on prior CT examinations and only minimally increased in size, likely benign such as a venous malformation or hamartoma. Adrenals/Urinary Tract: No adrenal nodules. No suspicious renal masses identified. No evidence of hydronephrosis. Bilateral simple cysts. No specific follow-up imaging recommended. Stomach/Bowel: Colonic diverticulosis without acute diverticulitis. Vascular/Lymphatic: No pathologically enlarged lymph nodes identified. No abdominal aortic aneurysm demonstrated. Aortic atherosclerosis. Other:  None. Musculoskeletal: No suspicious bone lesions identified. IMPRESSION: 1. Moderate intrahepatic bile duct dilation with abrupt tapering at the level of the hepatic hilum. No discrete filling defect, obstructing lesion, or signal abnormality. Findings may be secondary to benign stricture or cholangiocarcinoma. Recommend consultation to gastroenterology. 2. Colonic diverticulosis without acute diverticulitis. 3. A 2.2 cm mildly T2 hyperintense, enhancing focus along the medial posterior spleen is subtly seen in retrospect on prior CT examinations and only minimally increased in size, likely benign such as a venous malformation or hamartoma. 4. Aortic Atherosclerosis (ICD10-I70.0). Electronically Signed   By: Limin  Xu M.D.   On: 03/14/2024 15:45    MR 3D Recon At Scanner Result Date: 03/14/2024 CLINICAL DATA:  Further evaluation of intrahepatic bile duct dilation. Nausea. EXAM: MRI ABDOMEN WITHOUT AND WITH CONTRAST (INCLUDING MRCP) TECHNIQUE: Multiplanar multisequence MR imaging of the abdomen was performed both before and after the administration of intravenous contrast. Heavily T2-weighted images of the biliary and pancreatic ducts were obtained. Post-processing was applied at the acquisition scanner with concurrent physician supervision which includes 3D reconstructions, MIPs, volume rendered images and/or shaded surface rendering. CONTRAST:  10mL GADAVIST  GADOBUTROL  1 MMOL/ML IV SOLN COMPARISON:  CT abdomen and pelvis dated 03/10/2024 FINDINGS: Lower chest: No acute findings. Hepatobiliary: Atrophic left hepatic  lobe. Moderate intrahepatic bile duct dilation with abrupt tapering at the level of the hepatic hilum (14:39). The common bile duct is nondilated, also demonstrating tapering at the hepatic hilum. No discrete filling defect, obstructing lesion, or signal abnormality. Underdistended gallbladder. Pancreas: No mass, inflammatory changes, or other parenchymal abnormality identified. Spleen: 2.2 x 1.8 cm mildly T2 hyperintense, enhancing focus along the medial posterior spleen (9:15) is subtly seen in retrospect on prior CT examinations and only minimally increased in size, likely benign such as a venous malformation or hamartoma. Adrenals/Urinary Tract: No adrenal nodules. No suspicious renal masses identified. No evidence of hydronephrosis. Bilateral simple cysts. No specific follow-up imaging recommended. Stomach/Bowel: Colonic diverticulosis without acute diverticulitis. Vascular/Lymphatic: No pathologically enlarged lymph nodes identified. No abdominal aortic aneurysm demonstrated. Aortic atherosclerosis. Other:  None. Musculoskeletal: No suspicious bone lesions identified. IMPRESSION: 1. Moderate intrahepatic bile duct dilation with abrupt  tapering at the level of the hepatic hilum. No discrete filling defect, obstructing lesion, or signal abnormality. Findings may be secondary to benign stricture or cholangiocarcinoma. Recommend consultation to gastroenterology. 2. Colonic diverticulosis without acute diverticulitis. 3. A 2.2 cm mildly T2 hyperintense, enhancing focus along the medial posterior spleen is subtly seen in retrospect on prior CT examinations and only minimally increased in size, likely benign such as a venous malformation or hamartoma. 4. Aortic Atherosclerosis (ICD10-I70.0). Electronically Signed   By: Limin  Xu M.D.   On: 03/14/2024 15:45               LOS: 2 days   Beni Turrell  [facility]   Pager on [email or web address]. If 7PM-7AM, please contact night-coverage at [email or web address]     03/16/2024, 1:01 PM

## 2024-03-16 NOTE — Progress Notes (Signed)
 Patient clinically stable post IR PTC biliary drain placement per Dr Julietta Ogren, tolerated well. Vitals stable post procedure. Awake/alert and oriented post procedure. Received Versed  2 mg along with Fentanyl  100 mcg IV for procedure. Wife with patient at bedside post procedure with update given per Dr Julietta Ogren. Transported back to 118 with bedside report given to care nurse post procedure/recovery.

## 2024-03-16 NOTE — Procedures (Signed)
 Interventional Radiology Procedure:   Indications: Obstructive jaundice. ERCP stent placed in CBD and left biliary ducts but unable to visualize right biliary ducts.   Procedure: PTC and placement of internal / external biliary drain  Findings: Dilatation of right hepatic bile ducts.  Access obtained from a peripheral right biliary duct.  Obstruction near the hilum.   Catheter and drain successfully advanced beyond the obstruction and into the duodenum.    Complications: None     EBL: Minimal  Plan: Follow drain output and liver labs. Biliary fluid was sent for cytology.   Magic Mohler R. Julietta Ogren, MD  Pager: [phone]

## 2024-03-16 NOTE — Progress Notes (Signed)
 Casey Sink, MD [facility]   [address], [address] [place], [place] [address] Phone: [phone] Fax : [phone]   Subjective: The patient had an ERCP yesterday with a finding of a segmental stricture in the common hepatic duct and limited filling of the intrahepatic ducts.  A stent was placed in the visible portion of the intrahepatic ducts but much of the right side was not seen.  The patient's bilirubin increased today despite having the stent in place.   Objective: Vital signs in last 24 hours: Vitals:   03/15/24 1717 03/15/24 2103 03/16/24 0458 03/16/24 0729  BP: (!) 149/91 (!) 147/82 (!) 143/86 (!) 149/81  Pulse: 80 89 82 89  Resp: 18 18  16   Temp: 100.1 F (37.8 C) 98.2 F (36.8 C) 99.1 F (37.3 C) 99.2 F (37.3 C)  TempSrc: Oral     SpO2: 96% 95% 95% 97%  Weight:      Height:       Weight change: -0.019 kg  Intake/Output Summary (Last 24 hours) at 03/16/2024 1017 Last data filed at 03/16/2024 0458 Gross per 24 hour  Intake 640 ml  Output 700 ml  Net -60 ml     Exam: Heart:: Regular rate and rhythm or without murmur or extra heart sounds Lungs: normal and clear to auscultation and percussion Abdomen: soft, nontender, normal bowel sounds   Lab Results: @LABTEST2 @ Micro Results: No results found for this or any previous visit (from the past 240 hours). Studies/Results: DG C-Arm 1-60 Min-No Report Result Date: 03/15/2024 Fluoroscopy was utilized by the requesting physician.  No radiographic interpretation.   MR ABDOMEN MRCP W WO CONTAST Result Date: 03/14/2024 CLINICAL DATA:  Further evaluation of intrahepatic bile duct dilation. Nausea. EXAM: MRI ABDOMEN WITHOUT AND WITH CONTRAST (INCLUDING MRCP) TECHNIQUE: Multiplanar multisequence MR imaging of the abdomen was performed both before and after the administration of intravenous contrast. Heavily T2-weighted images of the biliary and pancreatic ducts were obtained. Post-processing was applied at the acquisition  scanner with concurrent physician supervision which includes 3D reconstructions, MIPs, volume rendered images and/or shaded surface rendering. CONTRAST:  10mL GADAVIST  GADOBUTROL  1 MMOL/ML IV SOLN COMPARISON:  CT abdomen and pelvis dated 03/10/2024 FINDINGS: Lower chest: No acute findings. Hepatobiliary: Atrophic left hepatic lobe. Moderate intrahepatic bile duct dilation with abrupt tapering at the level of the hepatic hilum (14:39). The common bile duct is nondilated, also demonstrating tapering at the hepatic hilum. No discrete filling defect, obstructing lesion, or signal abnormality. Underdistended gallbladder. Pancreas: No mass, inflammatory changes, or other parenchymal abnormality identified. Spleen: 2.2 x 1.8 cm mildly T2 hyperintense, enhancing focus along the medial posterior spleen (9:15) is subtly seen in retrospect on prior CT examinations and only minimally increased in size, likely benign such as a venous malformation or hamartoma. Adrenals/Urinary Tract: No adrenal nodules. No suspicious renal masses identified. No evidence of hydronephrosis. Bilateral simple cysts. No specific follow-up imaging recommended. Stomach/Bowel: Colonic diverticulosis without acute diverticulitis. Vascular/Lymphatic: No pathologically enlarged lymph nodes identified. No abdominal aortic aneurysm demonstrated. Aortic atherosclerosis. Other:  None. Musculoskeletal: No suspicious bone lesions identified. IMPRESSION: 1. Moderate intrahepatic bile duct dilation with abrupt tapering at the level of the hepatic hilum. No discrete filling defect, obstructing lesion, or signal abnormality. Findings may be secondary to benign stricture or cholangiocarcinoma. Recommend consultation to gastroenterology. 2. Colonic diverticulosis without acute diverticulitis. 3. A 2.2 cm mildly T2 hyperintense, enhancing focus along the medial posterior spleen is subtly seen in retrospect on prior CT examinations and only  minimally increased in size,  likely benign such as a venous malformation or hamartoma. 4. Aortic Atherosclerosis (ICD10-I70.0). Electronically Signed   By: Limin  Xu M.D.   On: 03/14/2024 15:45   MR 3D Recon At Scanner Result Date: 03/14/2024 CLINICAL DATA:  Further evaluation of intrahepatic bile duct dilation. Nausea. EXAM: MRI ABDOMEN WITHOUT AND WITH CONTRAST (INCLUDING MRCP) TECHNIQUE: Multiplanar multisequence MR imaging of the abdomen was performed both before and after the administration of intravenous contrast. Heavily T2-weighted images of the biliary and pancreatic ducts were obtained. Post-processing was applied at the acquisition scanner with concurrent physician supervision which includes 3D reconstructions, MIPs, volume rendered images and/or shaded surface rendering. CONTRAST:  10mL GADAVIST  GADOBUTROL  1 MMOL/ML IV SOLN COMPARISON:  CT abdomen and pelvis dated 03/10/2024 FINDINGS: Lower chest: No acute findings. Hepatobiliary: Atrophic left hepatic lobe. Moderate intrahepatic bile duct dilation with abrupt tapering at the level of the hepatic hilum (14:39). The common bile duct is nondilated, also demonstrating tapering at the hepatic hilum. No discrete filling defect, obstructing lesion, or signal abnormality. Underdistended gallbladder. Pancreas: No mass, inflammatory changes, or other parenchymal abnormality identified. Spleen: 2.2 x 1.8 cm mildly T2 hyperintense, enhancing focus along the medial posterior spleen (9:15) is subtly seen in retrospect on prior CT examinations and only minimally increased in size, likely benign such as a venous malformation or hamartoma. Adrenals/Urinary Tract: No adrenal nodules. No suspicious renal masses identified. No evidence of hydronephrosis. Bilateral simple cysts. No specific follow-up imaging recommended. Stomach/Bowel: Colonic diverticulosis without acute diverticulitis. Vascular/Lymphatic: No pathologically enlarged lymph nodes identified. No abdominal aortic aneurysm demonstrated.  Aortic atherosclerosis. Other:  None. Musculoskeletal: No suspicious bone lesions identified. IMPRESSION: 1. Moderate intrahepatic bile duct dilation with abrupt tapering at the level of the hepatic hilum. No discrete filling defect, obstructing lesion, or signal abnormality. Findings may be secondary to benign stricture or cholangiocarcinoma. Recommend consultation to gastroenterology. 2. Colonic diverticulosis without acute diverticulitis. 3. A 2.2 cm mildly T2 hyperintense, enhancing focus along the medial posterior spleen is subtly seen in retrospect on prior CT examinations and only minimally increased in size, likely benign such as a venous malformation or hamartoma. 4. Aortic Atherosclerosis (ICD10-I70.0). Electronically Signed   By: Limin  Xu M.D.   On: 03/14/2024 15:45   Medications: I have reviewed the patient's current medications. Scheduled Meds:  aspirin  EC  81 mg Oral QPM   insulin  aspart  0-15 Units Subcutaneous TID WC   linagliptin  5 mg Oral QPM   nitrofurantoin  (macrocrystal-monohydrate)  100 mg Oral Daily   testosterone   5 g Transdermal Daily   Continuous Infusions:  lactated ringers     PRN Meds:.calcium  carbonate, ondansetron  **OR** ondansetron  (ZOFRAN ) IV   Assessment: Principal Problem:   Painless jaundice    Plan: The patient has a stent placed which was likely draining the left system and unlikely draining the right system since I did not see significant dilation.  The duct was brushed for cytology to rule out malignancy.  The patient continues to have an increase in bilirubin and therefore I have spoken to interventional radiology about doing a PTC to drain the right side of the biliary tree.  I have spoken to the patient and his wife and explained the plan to them.   LOS: 2 days   Casey Sink, MD.FACG 03/16/2024, 10:17 AM Pager [phone] 7am-5pm  Check AMION for 5pm -7am coverage and on weekends

## 2024-03-17 DIAGNOSIS — R17 Unspecified jaundice: Secondary | ICD-10-CM | POA: Diagnosis not present

## 2024-03-17 DIAGNOSIS — R748 Abnormal levels of other serum enzymes: Secondary | ICD-10-CM | POA: Diagnosis present

## 2024-03-17 LAB — GLUCOSE, CAPILLARY
Glucose-Capillary: 191 mg/dL — ABNORMAL HIGH (ref 70–99)
Glucose-Capillary: 158 mg/dL — ABNORMAL HIGH (ref 70–99)

## 2024-03-17 LAB — COMPREHENSIVE METABOLIC PANEL WITH GFR
ALT: 270 U/L — ABNORMAL HIGH (ref 0–44)
AST: 95 U/L — ABNORMAL HIGH (ref 15–41)
Albumin: 2.7 g/dL — ABNORMAL LOW (ref 3.5–5.0)
Alkaline Phosphatase: 475 U/L — ABNORMAL HIGH (ref 38–126)
Anion gap: 7 (ref 5–15)
BUN: 19 mg/dL (ref 8–23)
CO2: 26 mmol/L (ref 22–32)
Calcium: 8.5 mg/dL — ABNORMAL LOW (ref 8.9–10.3)
Chloride: 100 mmol/L (ref 98–111)
Creatinine, Ser: 0.76 mg/dL (ref 0.61–1.24)
GFR, Estimated: >60 mL/min (ref >60)
Glucose, Bld: 200 mg/dL — ABNORMAL HIGH (ref 70–99)
Potassium: 3.5 mmol/L (ref 3.5–5.1)
Sodium: 133 mmol/L — ABNORMAL LOW (ref 135–145)
Total Bilirubin: 12.6 mg/dL — ABNORMAL HIGH (ref 0.0–1.2)
Total Protein: 6.2 g/dL — ABNORMAL LOW (ref 6.5–8.1)

## 2024-03-17 LAB — URINE CULTURE: Culture: >=100000 — AB

## 2024-03-17 LAB — CA 19-9 (SERIAL): CA 19-9: 944 U/mL — ABNORMAL HIGH (ref 0–35)

## 2024-03-17 LAB — CYTOLOGY - NON PAP

## 2024-03-17 NOTE — Discharge Instructions (Signed)
 Empty drain daily, no need to keep or record output. No need to flush drain at this time.  Keep drain site clean and dry. May replace with gauze/tape as needed. Plan for follow-up with [facility] in approximately 4 weeks. Schedulers will contact you with appt information.

## 2024-03-17 NOTE — Discharge Summary (Addendum)
 Physician Discharge Summary   Patient: Casey Peters MRN: [id number] DOB: 02-18-1946  Admit date:     03/14/2024  Discharge date: 03/17/24  Discharge Physician: Sheril Dines   PCP: Little Riff, MD   Recommendations at discharge:   Follow-up with PCP within 1 week of discharge Follow-up with interventional radiologist as an outpatient to manage biliary drain (office will call to schedule appointment)  Discharge Diagnoses: Principal Problem:   Painless jaundice  Resolved Problems:   * No resolved hospital problems. *  Hospital Course:  COPELAND NEISEN is a 78 y.o. male with medical history significant of dilated cardiomyopathy, CAD RCA stent in 2023, HTN, HLD, IIDM, BPH with history of recurrent UTIs on suppressive antibiotics, who presented to the hospital because of abnormal labs (elevated liver enzymes) and CT abdomen findings.  Patient reported fatigue for several weeks and also complained of 8 pound weight loss in the 2 weeks preceding admission.  He complained of dark urine without any dysuria or urinary frequency.  He noticed yellowish discoloration of his eyes.  No abdominal pain or vomiting.       Workup revealed elevated liver enzymes with significant hyperbilirubinemia and intrahepatic biliary obstruction.  Assessment and Plan:  Elevated liver enzymes, painless jaundice, intrahepatic biliary duct dilatation: S/p ERCP with stent placement on 03/15/2024.   IR was consulted for drain placement because of worsening hyperbilirubinemia despite ERCP with stent. S/p PTC with placement of right-sided biliary drain by Dr. Julietta Ogren (interventional radiologist) on 03/16/2024. Bilirubin down from 19.9-12.6. Cytology is pending.  Patient and wife have been advised to follow-up cytology report with PCP. IR was consulted for discharge instructions.  Patient will follow-up with IR in the outpatient setting for biliary drain management.    Abnormal urinalysis, history of  recurrent UTIs: Patient is asymptomatic.  Urine culture showed Serratia marcescens.  This may be asymptomatic bacteriuria.  Patient said he does not have any symptoms and typically knows when he has a UTI.  No fever or leukocytosis during this admission.   Continue nitrofurantoin  (suppressive antibiotic)     CAD s/p RCA stent in 2023: Continue aspirin .  Statin on hold because of elevated liver enzymes.     Colonic diverticulosis without diverticulitis, 2.2 cm splenic lesion, aortic atherosclerosis noted on MRCP.  Outpatient follow-up     Comorbidities include BPH, hypertension, type II DM, hyperlipidemia   He feels better and wants to be discharged home today.  He is deemed stable for discharge.  Discharge plan discussed with patient and his wife at the bedside.               Consultants: Gastroenterologist, interventional radiologist Procedures performed: ERCP, PTC biliary drain Disposition: Home Diet recommendation:  Discharge Diet Orders (From admission, onward)     Start     Ordered   03/17/24 0000  Diet - low sodium heart healthy        03/17/24 1318           Cardiac diet DISCHARGE MEDICATION: Allergies as of 03/17/2024       Reactions   Citalopram Nausea And Vomiting   Dizziness, unsteady, balanced issues   Sulfamethoxazole -trimethoprim     Fatigue and muscle ache Other reaction(s): Other (See Comments) Fatigue and muscle ache   Trimethoprim  Other (See Comments)   Fatigue, hypotension        Medication List     TAKE these medications    ascorbic acid 500 MG tablet Commonly known as: VITAMIN C Take 500  mg by mouth daily.   aspirin  EC 81 MG tablet Take 81 mg by mouth daily.   carvedilol  6.25 MG tablet Commonly known as: COREG  Take 6.25 mg by mouth 2 (two) times daily with a meal.   cholecalciferol 25 MCG (1000 UNIT) tablet Commonly known as: VITAMIN D3 Take 1,000 Units by mouth daily.   citalopram 20 MG tablet Commonly known as:  CELEXA Take 20 mg by mouth daily.   furosemide 40 MG tablet Commonly known as: LASIX Take 40 mg by mouth daily.   glimepiride 1 MG tablet Commonly known as: AMARYL Take 1 mg by mouth daily with breakfast.   Januvia 50 MG tablet Generic drug: sitaGLIPtin Take 1 tablet by mouth daily.   metFORMIN 1000 MG tablet Commonly known as: GLUCOPHAGE Take 1,000 mg by mouth 2 (two) times daily.   nitrofurantoin  (macrocrystal-monohydrate) 100 MG capsule Commonly known as: MACROBID  TAKE 1 CAPSULE BY MOUTH ONCE DAILY   rosuvastatin  5 MG tablet Commonly known as: CRESTOR  Take 1 tablet (5 mg total) by mouth daily.   silodosin  8 MG Caps capsule Commonly known as: RAPAFLO  Take 1 capsule (8 mg total) by mouth daily with breakfast.   spironolactone  25 MG tablet Commonly known as: ALDACTONE  Take 12.5 mg by mouth daily.   testosterone  50 MG/5GM (1%) Gel Commonly known as: ANDROGEL  Place 5 g onto the skin daily. Please confirm with your cardiologist before resuming   Zinc Gluconate 30 MG Tabs Take 30 mg by mouth daily.        Follow-up Information     Little Riff, MD Follow up.   Specialty: Internal Medicine Why: hospital follow up Contact information: [address] [facility] [place] [address] [phone]         [facility] Follow up.   Specialty: Radiology Why: Schedulers will contact you with date and time of follow-up appointment. Contact information: [address] [place]  [address] [phone]               Discharge Exam: Cleavon Curls Weights   03/14/24 1154 03/15/24 1253  Weight: 90.7 kg 90.7 kg   GEN: NAD SKIN: Jaundice EYES: Icteric.  No pallor ENT: MMM CV: RRR PULM: CTA B ABD: soft, ND, NT, +BS CNS: AAO x 3, non focal EXT: No edema or tenderness   Condition at discharge: good  The results of significant diagnostics from this hospitalization (including imaging,  microbiology, ancillary and laboratory) are listed below for reference.   Imaging Studies: IR BILIARY DRAIN PLACEMENT WITH CHOLANGIOGRAM Result Date: 03/16/2024 INDICATION: 78 year old with obstructive jaundice. Recent imaging demonstrates biliary dilatation. Patient underwent ERCP and a nonmetallic biliary stent was placed in the common bile duct and common hepatic duct. Right hepatic ducts were not visualized during ERCP. Findings concerning for a hilar obstruction. Left hepatic lobe is atrophic based on recent imaging. EXAM: 1. Percutaneous transhepatic cholangiogram using ultrasound and fluoroscopic guidance. 2. Placement of internal/external biliary drain MEDICATIONS: Rocephin 2 g; The antibiotic was administered within an appropriate time frame prior to the initiation of the procedure. ANESTHESIA/SEDATION: Moderate (conscious) sedation was employed during this procedure. A total of Versed  2 mg and Fentanyl  100 mcg was administered intravenously by the radiology nurse. Total intra-service moderate Sedation Time: 43 minutes. The patient's level of consciousness and vital signs were monitored continuously by radiology nursing throughout the procedure under my direct supervision. FLUOROSCOPY: Radiation Exposure Index (as provided by the fluoroscopic device): 169 mGy Kerma CONTRAST:  15 mL Omnipaque  300 COMPLICATIONS: None immediate. PROCEDURE: Informed written consent was obtained from the patient after a thorough discussion of the procedural risks, benefits and alternatives. All questions were addressed. Maximal Sterile Barrier Technique was utilized including caps, mask, sterile gowns, sterile gloves, sterile drape, hand hygiene and skin antiseptic. A timeout was performed prior to the initiation of the procedure. Right side of the abdomen was prepped and draped in sterile fashion. Ultrasound demonstrated dilated right intrahepatic biliary ducts. A peripheral duct in the inferior right hepatic lobe was  targeted. Skin was anesthetized with 1% lidocaine . Using ultrasound guidance, a 21 gauge Chiba needle was directed into the right biliary duct and contrast injection confirmed placement in the biliary system. 0.018 wire was placed. Transitional dilator set was placed. J wire was placed and a Kumpe catheter was advanced over the wire into the central intrahepatic biliary system. Additional contrast was injected. Glidewire was successfully advanced into the common hepatic duct adjacent to the existing biliary stent. Catheter and wire were advanced into the duodenum. Superstiff Amplatz wire was placed. Tract was dilated with the 10 French dilator. However, the 10 [place] biliary drain would not advance beyond the hilar obstruction. Therefore, an 66 [place] biliary drain was advanced over the wire. The 8 French drain was successfully advanced beyond the obstruction and the tip was placed in the duodenum. Pigtail was formed in the duodenum. Contrast injection confirmed placement in the biliary system. Sample of bile was sent for cytology. Drain was flushed with saline and attached to a gravity bag. FINDINGS: Ultrasound demonstrated right biliary duct dilatation. A peripheral right biliary duct was successfully cannulated and cholangiogram demonstrated dilated right intrahepatic bile ducts with a central hilar obstruction. Nonmetallic biliary stent extends into the proximal left intrahepatic bile duct region. No significant filling of the left intrahepatic bile ducts. Drain was successfully advanced into the duodenum. IMPRESSION: 1. Percutaneous transhepatic cholangiogram demonstrated a dilated right biliary system with a hilar obstruction. 2. Successful placement of internal/external biliary drain. 3. Bile sample was sent for cytology. Electronically Signed   By: Elene Griffes M.D.   On: 03/16/2024 17:01   DG C-Arm 1-60 Min-No Report Result Date: 03/15/2024 Fluoroscopy was utilized by the requesting physician.  No  radiographic interpretation.   MR ABDOMEN MRCP W WO CONTAST Result Date: 03/14/2024 CLINICAL DATA:  Further evaluation of intrahepatic bile duct dilation. Nausea. EXAM: MRI ABDOMEN WITHOUT AND WITH CONTRAST (INCLUDING MRCP) TECHNIQUE: Multiplanar multisequence MR imaging of the abdomen was performed both before and after the administration of intravenous contrast. Heavily T2-weighted images of the biliary and pancreatic ducts were obtained. Post-processing was applied at the acquisition scanner with concurrent physician supervision which includes 3D reconstructions, MIPs, volume rendered images and/or shaded surface rendering. CONTRAST:  10mL GADAVIST  GADOBUTROL  1 MMOL/ML IV SOLN COMPARISON:  CT abdomen and pelvis dated 03/10/2024 FINDINGS: Lower chest: No acute findings. Hepatobiliary: Atrophic left hepatic lobe. Moderate intrahepatic bile duct dilation with abrupt tapering at the level of the hepatic hilum (14:39). The common bile duct is nondilated, also demonstrating tapering at the hepatic hilum. No discrete filling defect, obstructing lesion, or signal abnormality. Underdistended gallbladder. Pancreas: No mass, inflammatory changes, or other parenchymal abnormality identified. Spleen: 2.2 x 1.8 cm mildly T2 hyperintense, enhancing focus along the medial posterior spleen (9:15) is subtly seen in retrospect on prior CT examinations and only minimally increased in size, likely benign such as a venous malformation or hamartoma. Adrenals/Urinary Tract: No adrenal nodules. No suspicious renal masses identified. No evidence  of hydronephrosis. Bilateral simple cysts. No specific follow-up imaging recommended. Stomach/Bowel: Colonic diverticulosis without acute diverticulitis. Vascular/Lymphatic: No pathologically enlarged lymph nodes identified. No abdominal aortic aneurysm demonstrated. Aortic atherosclerosis. Other:  None. Musculoskeletal: No suspicious bone lesions identified. IMPRESSION: 1. Moderate intrahepatic  bile duct dilation with abrupt tapering at the level of the hepatic hilum. No discrete filling defect, obstructing lesion, or signal abnormality. Findings may be secondary to benign stricture or cholangiocarcinoma. Recommend consultation to gastroenterology. 2. Colonic diverticulosis without acute diverticulitis. 3. A 2.2 cm mildly T2 hyperintense, enhancing focus along the medial posterior spleen is subtly seen in retrospect on prior CT examinations and only minimally increased in size, likely benign such as a venous malformation or hamartoma. 4. Aortic Atherosclerosis (ICD10-I70.0). Electronically Signed   By: Limin  Xu M.D.   On: 03/14/2024 15:45   MR 3D Recon At Scanner Result Date: 03/14/2024 CLINICAL DATA:  Further evaluation of intrahepatic bile duct dilation. Nausea. EXAM: MRI ABDOMEN WITHOUT AND WITH CONTRAST (INCLUDING MRCP) TECHNIQUE: Multiplanar multisequence MR imaging of the abdomen was performed both before and after the administration of intravenous contrast. Heavily T2-weighted images of the biliary and pancreatic ducts were obtained. Post-processing was applied at the acquisition scanner with concurrent physician supervision which includes 3D reconstructions, MIPs, volume rendered images and/or shaded surface rendering. CONTRAST:  10mL GADAVIST  GADOBUTROL  1 MMOL/ML IV SOLN COMPARISON:  CT abdomen and pelvis dated 03/10/2024 FINDINGS: Lower chest: No acute findings. Hepatobiliary: Atrophic left hepatic lobe. Moderate intrahepatic bile duct dilation with abrupt tapering at the level of the hepatic hilum (14:39). The common bile duct is nondilated, also demonstrating tapering at the hepatic hilum. No discrete filling defect, obstructing lesion, or signal abnormality. Underdistended gallbladder. Pancreas: No mass, inflammatory changes, or other parenchymal abnormality identified. Spleen: 2.2 x 1.8 cm mildly T2 hyperintense, enhancing focus along the medial posterior spleen (9:15) is subtly seen in  retrospect on prior CT examinations and only minimally increased in size, likely benign such as a venous malformation or hamartoma. Adrenals/Urinary Tract: No adrenal nodules. No suspicious renal masses identified. No evidence of hydronephrosis. Bilateral simple cysts. No specific follow-up imaging recommended. Stomach/Bowel: Colonic diverticulosis without acute diverticulitis. Vascular/Lymphatic: No pathologically enlarged lymph nodes identified. No abdominal aortic aneurysm demonstrated. Aortic atherosclerosis. Other:  None. Musculoskeletal: No suspicious bone lesions identified. IMPRESSION: 1. Moderate intrahepatic bile duct dilation with abrupt tapering at the level of the hepatic hilum. No discrete filling defect, obstructing lesion, or signal abnormality. Findings may be secondary to benign stricture or cholangiocarcinoma. Recommend consultation to gastroenterology. 2. Colonic diverticulosis without acute diverticulitis. 3. A 2.2 cm mildly T2 hyperintense, enhancing focus along the medial posterior spleen is subtly seen in retrospect on prior CT examinations and only minimally increased in size, likely benign such as a venous malformation or hamartoma. 4. Aortic Atherosclerosis (ICD10-I70.0). Electronically Signed   By: Limin  Xu M.D.   On: 03/14/2024 15:45   CT ABDOMEN PELVIS W CONTRAST Result Date: 03/10/2024 EXAMINATION: CT ABDOMEN PELVIS W CONTRAST CLINICAL INDICATION: Male, 78 years old. Nausea and vomiting TECHNIQUE: Axial CT of the abdomen and pelvis with 100 cc Omnipaque  300 intravenous contrast. Multiplanar reformations provided. Unless otherwise specified, incidental thyroid, adrenal, renal lesions do not require dedicated imaging follow up. Additionally, any mentioned pulmonary nodules do not require dedicated imaging follow-up based on the Fleischner guidelines unless otherwise specified. Coronary calcifications are not identified unless otherwise specified. COMPARISON: 07/19/2015 FINDINGS: There  are minimal atelectatic changes in the lung bases. The heart is normal in size. There  are coronary calcifications. There is new mild intrahepatic biliary ductal dilatation predominantly affecting the left lobe. The gallbladder is normal. The spleen is normal. The pancreas appears normal. The adrenals are normal. The right kidney is normal. Left renal cyst is seen. Abdominal aorta is normal in caliber. Scattered atherosclerotic changes are present. Bladder is normal. The prostate is enlarged. Large and small bowel loops are otherwise within normal limits. There is no free fluid or pathologic lymphadenopathy by size criteria. There are degenerative changes of the spine and bony pelvis. IMPRESSION: New mild intrahepatic biliary ductal dilatation predominantly affecting the left lobe. Recommend further evaluation with multiphase MRI of the abdomen to include MRCP sequencing. DOSE REDUCTION: This exam was performed according to our departmental dose-optimization program which includes automated exposure control, adjustment of the mA and/or kV according to patient size and/or use of iterative reconstruction technique. Electronically signed by: [place] Engel MD 03/10/2024 05:30 PM EDT RP Workstation: [id number]    Microbiology: Results for orders placed or performed during the hospital encounter of 03/14/24  Urine Culture (for pregnant, neutropenic or urologic patients or patients with an indwelling urinary catheter)     Status: Abnormal   Collection Time: 03/14/24  5:13 PM   Specimen: Urine, Clean Catch  Result Value Ref Range Status   Specimen Description   Final    URINE, CLEAN CATCH Performed at [facility], [address] [place], [place] [address]    Special Requests   Final    NONE Performed at [facility], [address] [place], [place] [address]    Culture >=100,000 COLONIES/mL SERRATIA MARCESCENS (A)  Final   Report Status 03/17/2024 FINAL  Final   Organism ID, Bacteria  SERRATIA MARCESCENS (A)  Final      Susceptibility   Serratia marcescens - MIC*    CEFEPIME <=0.12 SENSITIVE Sensitive     CEFTRIAXONE <=0.25 SENSITIVE Sensitive     CIPROFLOXACIN  <=0.25 SENSITIVE Sensitive     GENTAMICIN  <=1 SENSITIVE Sensitive     NITROFURANTOIN  256 RESISTANT Resistant     TRIMETH /SULFA  <=20 SENSITIVE Sensitive     * >=100,000 COLONIES/mL SERRATIA MARCESCENS    Labs: CBC: Recent Labs  Lab 03/14/24 1159 03/15/24 0433  WBC 9.0 8.3  HGB 15.2 13.7  HCT 46.7 41.9  MCV 93.2 92.9  PLT 191 176   Basic Metabolic Panel: Recent Labs  Lab 03/14/24 1159 03/15/24 0433 03/16/24 0341 03/17/24 0801  NA 135 134* 132* 133*  K 3.8 3.7 4.2 3.5  CL 97* 103 98 100  CO2 25 22 23 26   GLUCOSE 146* 77 244* 200*  BUN 23 18 19 19   CREATININE 0.75 0.57* 0.66 0.76  CALCIUM  9.1 8.7* 9.0 8.5*   Liver Function Tests: Recent Labs  Lab 03/14/24 1159 03/15/24 0433 03/16/24 0341 03/17/24 0801  AST 208* 167* 145* 95*  ALT 520* 397* 345* 270*  ALKPHOS 632* 549* 559* 475*  BILITOT 17.1* 17.2* 19.9* 12.6*  PROT 7.2 6.4* 6.5 6.2*  ALBUMIN 3.1* 2.8* 2.8* 2.7*   CBG: Recent Labs  Lab 03/16/24 1328 03/16/24 1658 03/16/24 2055 03/17/24 0731 03/17/24 1133  GLUCAP 136* 156* 201* 191* 158*    Discharge time spent: greater than 30 minutes.  Signed: Sheril Dines, MD [facility] 03/17/2024

## 2024-03-17 NOTE — Plan of Care (Signed)

## 2024-03-17 NOTE — Progress Notes (Signed)
 Referring Physician(s): Dr. Marnee Sink  Supervising Physician: Dr. Susan Ensign  Patient Status:  [facility] - In-pt  Chief Complaint: Obstructive jaundice  Subjective: Eating lunch during visit.  Very stoic today, does not participate much in conversation and does not offer much complaint or concern.  Drain in place with significant output.   Allergies: Citalopram, Sulfamethoxazole -trimethoprim , and Trimethoprim   Medications: Prior to Admission medications   Medication Sig Start Date End Date Taking? Authorizing Provider  ascorbic acid (VITAMIN C) 500 MG tablet Take 500 mg by mouth daily.   Yes [provider]  aspirin  EC 81 MG tablet Take 81 mg by mouth daily.   Yes [provider]  cholecalciferol (VITAMIN D3) 25 MCG (1000 UNIT) tablet Take 1,000 Units by mouth daily.   Yes [provider]  furosemide (LASIX) 40 MG tablet Take 40 mg by mouth daily.   Yes [provider]  glimepiride (AMARYL) 1 MG tablet Take 1 mg by mouth daily with breakfast. 03/03/24 03/03/25 Yes [provider]  JANUVIA 50 MG tablet Take 1 tablet by mouth daily. 02/29/24 02/28/25 Yes [provider]  nitrofurantoin , macrocrystal-monohydrate, (MACROBID ) 100 MG capsule TAKE 1 CAPSULE BY MOUTH ONCE DAILY 11/18/23  Yes Vaillancourt, Samantha, PA-C  rosuvastatin  (CRESTOR ) 5 MG tablet Take 1 tablet (5 mg total) by mouth daily. 12/30/21  Yes Luna Salinas, MD  silodosin  (RAPAFLO ) 8 MG CAPS capsule Take 1 capsule (8 mg total) by mouth daily with breakfast. 12/10/23  Yes Vaillancourt, Samantha, PA-C  spironolactone  (ALDACTONE ) 25 MG tablet Take 12.5 mg by mouth daily. 01/04/24  Yes [provider]  testosterone  (ANDROGEL ) 50 MG/5GM (1%) GEL Place 5 g onto the skin daily. Please confirm with your cardiologist before resuming 12/29/21  Yes Amin, Sumayya, MD  Zinc Gluconate 30 MG TABS Take 30 mg by mouth daily.   Yes [provider]  citalopram (CELEXA) 20  MG tablet Take 20 mg by mouth daily. Patient not taking: Reported on 03/14/2024 02/03/24   [provider]  metFORMIN (GLUCOPHAGE) 1000 MG tablet Take 1,000 mg by mouth 2 (two) times daily. Patient not taking: Reported on 03/14/2024 11/30/23   [provider]     Vital Signs: BP 138/72 (BP Location: Left Arm)   Pulse 94   Temp 98.2 F (36.8 C) (Oral)   Resp 16   Ht 5\' 11"  (1.803 m)   Wt 199 lb 15.3 oz (90.7 kg)   SpO2 94%   BMI 27.89 kg/m   Physical Exam NAD, alert Abdomen: RUQ drain in place.  Insertion site clean and dry.  Dressing in place.  Drain flushes easily.  Significant volume dark bilious output in gravity bag.   Imaging: IR BILIARY DRAIN PLACEMENT WITH CHOLANGIOGRAM Result Date: 03/16/2024 INDICATION: 78 year old with obstructive jaundice. Recent imaging demonstrates biliary dilatation. Patient underwent ERCP and a nonmetallic biliary stent was placed in the common bile duct and common hepatic duct. Right hepatic ducts were not visualized during ERCP. Findings concerning for a hilar obstruction. Left hepatic lobe is atrophic based on recent imaging. EXAM: 1. Percutaneous transhepatic cholangiogram using ultrasound and fluoroscopic guidance. 2. Placement of internal/external biliary drain MEDICATIONS: Rocephin 2 g; The antibiotic was administered within an appropriate time frame prior to the initiation of the procedure. ANESTHESIA/SEDATION: Moderate (conscious) sedation was employed during this procedure. A total of Versed  2 mg and Fentanyl  100 mcg was administered intravenously by the radiology nurse. Total intra-service moderate Sedation Time: 43 minutes. The patient's level of consciousness and  vital signs were monitored continuously by radiology nursing throughout the procedure under my direct supervision. FLUOROSCOPY: Radiation Exposure Index (as provided by the fluoroscopic device): 169 mGy Kerma CONTRAST:  15 mL Omnipaque  300 COMPLICATIONS: None immediate.  PROCEDURE: Informed written consent was obtained from the patient after a thorough discussion of the procedural risks, benefits and alternatives. All questions were addressed. Maximal Sterile Barrier Technique was utilized including caps, mask, sterile gowns, sterile gloves, sterile drape, hand hygiene and skin antiseptic. A timeout was performed prior to the initiation of the procedure. Right side of the abdomen was prepped and draped in sterile fashion. Ultrasound demonstrated dilated right intrahepatic biliary ducts. A peripheral duct in the inferior right hepatic lobe was targeted. Skin was anesthetized with 1% lidocaine . Using ultrasound guidance, a 21 gauge Chiba needle was directed into the right biliary duct and contrast injection confirmed placement in the biliary system. 0.018 wire was placed. Transitional dilator set was placed. J wire was placed and a Kumpe catheter was advanced over the wire into the central intrahepatic biliary system. Additional contrast was injected. Glidewire was successfully advanced into the common hepatic duct adjacent to the existing biliary stent. Catheter and wire were advanced into the duodenum. Superstiff Amplatz wire was placed. Tract was dilated with the 10 French dilator. However, the 10 [place] biliary drain would not advance beyond the hilar obstruction. Therefore, an 11 [place] biliary drain was advanced over the wire. The 8 French drain was successfully advanced beyond the obstruction and the tip was placed in the duodenum. Pigtail was formed in the duodenum. Contrast injection confirmed placement in the biliary system. Sample of bile was sent for cytology. Drain was flushed with saline and attached to a gravity bag. FINDINGS: Ultrasound demonstrated right biliary duct dilatation. A peripheral right biliary duct was successfully cannulated and cholangiogram demonstrated dilated right intrahepatic bile ducts with a central hilar obstruction. Nonmetallic biliary stent  extends into the proximal left intrahepatic bile duct region. No significant filling of the left intrahepatic bile ducts. Drain was successfully advanced into the duodenum. IMPRESSION: 1. Percutaneous transhepatic cholangiogram demonstrated a dilated right biliary system with a hilar obstruction. 2. Successful placement of internal/external biliary drain. 3. Bile sample was sent for cytology. Electronically Signed   By: Elene Griffes M.D.   On: 03/16/2024 17:01   DG C-Arm 1-60 Min-No Report Result Date: 03/15/2024 Fluoroscopy was utilized by the requesting physician.  No radiographic interpretation.   MR ABDOMEN MRCP W WO CONTAST Result Date: 03/14/2024 CLINICAL DATA:  Further evaluation of intrahepatic bile duct dilation. Nausea. EXAM: MRI ABDOMEN WITHOUT AND WITH CONTRAST (INCLUDING MRCP) TECHNIQUE: Multiplanar multisequence MR imaging of the abdomen was performed both before and after the administration of intravenous contrast. Heavily T2-weighted images of the biliary and pancreatic ducts were obtained. Post-processing was applied at the acquisition scanner with concurrent physician supervision which includes 3D reconstructions, MIPs, volume rendered images and/or shaded surface rendering. CONTRAST:  10mL GADAVIST  GADOBUTROL  1 MMOL/ML IV SOLN COMPARISON:  CT abdomen and pelvis dated 03/10/2024 FINDINGS: Lower chest: No acute findings. Hepatobiliary: Atrophic left hepatic lobe. Moderate intrahepatic bile duct dilation with abrupt tapering at the level of the hepatic hilum (14:39). The common bile duct is nondilated, also demonstrating tapering at the hepatic hilum. No discrete filling defect, obstructing lesion, or signal abnormality. Underdistended gallbladder. Pancreas: No mass, inflammatory changes, or other parenchymal abnormality identified. Spleen: 2.2 x 1.8 cm mildly T2 hyperintense, enhancing focus along the medial posterior spleen (9:15) is subtly seen in retrospect on  prior CT examinations and only  minimally increased in size, likely benign such as a venous malformation or hamartoma. Adrenals/Urinary Tract: No adrenal nodules. No suspicious renal masses identified. No evidence of hydronephrosis. Bilateral simple cysts. No specific follow-up imaging recommended. Stomach/Bowel: Colonic diverticulosis without acute diverticulitis. Vascular/Lymphatic: No pathologically enlarged lymph nodes identified. No abdominal aortic aneurysm demonstrated. Aortic atherosclerosis. Other:  None. Musculoskeletal: No suspicious bone lesions identified. IMPRESSION: 1. Moderate intrahepatic bile duct dilation with abrupt tapering at the level of the hepatic hilum. No discrete filling defect, obstructing lesion, or signal abnormality. Findings may be secondary to benign stricture or cholangiocarcinoma. Recommend consultation to gastroenterology. 2. Colonic diverticulosis without acute diverticulitis. 3. A 2.2 cm mildly T2 hyperintense, enhancing focus along the medial posterior spleen is subtly seen in retrospect on prior CT examinations and only minimally increased in size, likely benign such as a venous malformation or hamartoma. 4. Aortic Atherosclerosis (ICD10-I70.0). Electronically Signed   By: Limin  Xu M.D.   On: 03/14/2024 15:45   MR 3D Recon At Scanner Result Date: 03/14/2024 CLINICAL DATA:  Further evaluation of intrahepatic bile duct dilation. Nausea. EXAM: MRI ABDOMEN WITHOUT AND WITH CONTRAST (INCLUDING MRCP) TECHNIQUE: Multiplanar multisequence MR imaging of the abdomen was performed both before and after the administration of intravenous contrast. Heavily T2-weighted images of the biliary and pancreatic ducts were obtained. Post-processing was applied at the acquisition scanner with concurrent physician supervision which includes 3D reconstructions, MIPs, volume rendered images and/or shaded surface rendering. CONTRAST:  10mL GADAVIST  GADOBUTROL  1 MMOL/ML IV SOLN COMPARISON:  CT abdomen and pelvis dated 03/10/2024  FINDINGS: Lower chest: No acute findings. Hepatobiliary: Atrophic left hepatic lobe. Moderate intrahepatic bile duct dilation with abrupt tapering at the level of the hepatic hilum (14:39). The common bile duct is nondilated, also demonstrating tapering at the hepatic hilum. No discrete filling defect, obstructing lesion, or signal abnormality. Underdistended gallbladder. Pancreas: No mass, inflammatory changes, or other parenchymal abnormality identified. Spleen: 2.2 x 1.8 cm mildly T2 hyperintense, enhancing focus along the medial posterior spleen (9:15) is subtly seen in retrospect on prior CT examinations and only minimally increased in size, likely benign such as a venous malformation or hamartoma. Adrenals/Urinary Tract: No adrenal nodules. No suspicious renal masses identified. No evidence of hydronephrosis. Bilateral simple cysts. No specific follow-up imaging recommended. Stomach/Bowel: Colonic diverticulosis without acute diverticulitis. Vascular/Lymphatic: No pathologically enlarged lymph nodes identified. No abdominal aortic aneurysm demonstrated. Aortic atherosclerosis. Other:  None. Musculoskeletal: No suspicious bone lesions identified. IMPRESSION: 1. Moderate intrahepatic bile duct dilation with abrupt tapering at the level of the hepatic hilum. No discrete filling defect, obstructing lesion, or signal abnormality. Findings may be secondary to benign stricture or cholangiocarcinoma. Recommend consultation to gastroenterology. 2. Colonic diverticulosis without acute diverticulitis. 3. A 2.2 cm mildly T2 hyperintense, enhancing focus along the medial posterior spleen is subtly seen in retrospect on prior CT examinations and only minimally increased in size, likely benign such as a venous malformation or hamartoma. 4. Aortic Atherosclerosis (ICD10-I70.0). Electronically Signed   By: Limin  Xu M.D.   On: 03/14/2024 15:45    Labs:  CBC: Recent Labs    03/14/24 1159 03/15/24 0433  WBC 9.0 8.3   HGB 15.2 13.7  HCT 46.7 41.9  PLT 191 176    COAGS: Recent Labs    03/16/24 0923  INR 1.0    BMP: Recent Labs    03/14/24 1159 03/15/24 0433 03/16/24 0341 03/17/24 0801  NA 135 134* 132* 133*  K 3.8 3.7 4.2 3.5  CL 97* 103 98 100  CO2 25 22 23 26   GLUCOSE 146* 77 244* 200*  BUN 23 18 19 19   CALCIUM  9.1 8.7* 9.0 8.5*  CREATININE 0.75 0.57* 0.66 0.76  GFRNONAA >60 >60 >60 >60    LIVER FUNCTION TESTS: Recent Labs    03/14/24 1159 03/15/24 0433 03/16/24 0341 03/17/24 0801  BILITOT 17.1* 17.2* 19.9* 12.6*  AST 208* 167* 145* 95*  ALT 520* 397* 345* 270*  ALKPHOS 632* 549* 559* 475*  PROT 7.2 6.4* 6.5 6.2*  ALBUMIN 3.1* 2.8* 2.8* 2.7*    Assessment and Plan: Obstructive jaundice s/p internal/external biliary drain Patient s/p right-sided biliary drain placement by Dr. Julietta Ogren.  Tbili improved to 12.2 this AM.  Patient eating with tolerance during visit.  Likely to d/c home today.  Discussed drain care with wife- given site care instruction.  Follow-up has been ordered and contact information is listed on his discharge paperwork.  Plan to see [facility] in 4 weeks for routine drain exchange.  If internalization or upsize needed, will likely need a second appointment, however first plan for routine exchange per Dr. Julietta Ogren with trend of labs and further consideration of GI interventions. Patient and wife verbalize understanding.    Electronically Signed: Shelle Galdamez Sue-Ellen Berman Grainger, PA 03/17/2024, 12:59 PM   I spent a total of 15 Minutes at the the patient's bedside AND on the patient's hospital floor or unit, greater than 50% of which was counseling/coordinating care for obstructive jaundice.

## 2024-03-17 NOTE — Progress Notes (Addendum)
 Marnee Sink, MD [name]   [address], [address] [place], [place] [address] Phone: [phone] Fax : [phone]   Subjective: The patient is laying in bed in no apparent distress.  The patient's wife states that he had some abdominal discomfort but was able to eat his meals today.  He also had the PTC done yesterday with good drainage of his biliary system and a drop in his bilirubin down to 12.6 from 19.9 yesterday.  The drainage has slowed down.   Objective: Vital signs in last 24 hours: Vitals:   03/16/24 1703 03/16/24 2114 03/17/24 0428 03/17/24 0734  BP: (!) 136/92 134/63 132/68 127/73  Pulse: 83 89 87 86  Resp: 16 18 20 16   Temp: 98.2 F (36.8 C) 99.2 F (37.3 C) 99 F (37.2 C) 99.3 F (37.4 C)  TempSrc: Oral     SpO2: 95% 93% 93% 94%  Weight:      Height:       Weight change:   Intake/Output Summary (Last 24 hours) at 03/17/2024 0917 Last data filed at 03/17/2024 0516 Gross per 24 hour  Intake --  Output 1310 ml  Net -1310 ml     Exam: General: Patient lying in bed in no apparent distress.  Biliary drain in place with minimal fluid in the bag   Lab Results: @LABTEST2 @ Micro Results: Recent Results (from the past 240 hours)  Urine Culture (for pregnant, neutropenic or urologic patients or patients with an indwelling urinary catheter)     Status: Abnormal   Collection Time: 03/14/24  5:13 PM   Specimen: Urine, Clean Catch  Result Value Ref Range Status   Specimen Description   Final    URINE, CLEAN CATCH Performed at [facility], [address], [place], [place] [address]    Special Requests   Final    NONE Performed at [facility], [address] [place], [place] [address]    Culture >=100,000 COLONIES/mL SERRATIA MARCESCENS (A)  Final   Report Status 03/17/2024 FINAL  Final   Organism ID, Bacteria SERRATIA MARCESCENS (A)  Final      Susceptibility   Serratia marcescens - MIC*    CEFEPIME <=0.12 SENSITIVE Sensitive      CEFTRIAXONE <=0.25 SENSITIVE Sensitive     CIPROFLOXACIN  <=0.25 SENSITIVE Sensitive     GENTAMICIN  <=1 SENSITIVE Sensitive     NITROFURANTOIN  256 RESISTANT Resistant     TRIMETH /SULFA  <=20 SENSITIVE Sensitive     * >=100,000 COLONIES/mL SERRATIA MARCESCENS   Studies/Results: IR BILIARY DRAIN PLACEMENT WITH CHOLANGIOGRAM Result Date: 03/16/2024 INDICATION: 78 year old with obstructive jaundice. Recent imaging demonstrates biliary dilatation. Patient underwent ERCP and a nonmetallic biliary stent was placed in the common bile duct and common hepatic duct. Right hepatic ducts were not visualized during ERCP. Findings concerning for a hilar obstruction. Left hepatic lobe is atrophic based on recent imaging. EXAM: 1. Percutaneous transhepatic cholangiogram using ultrasound and fluoroscopic guidance. 2. Placement of internal/external biliary drain MEDICATIONS: Rocephin 2 g; The antibiotic was administered within an appropriate time frame prior to the initiation of the procedure. ANESTHESIA/SEDATION: Moderate (conscious) sedation was employed during this procedure. A total of Versed  2 mg and Fentanyl  100 mcg was administered intravenously by the radiology nurse. Total intra-service moderate Sedation Time: 43 minutes. The patient's level of consciousness and vital signs were monitored continuously by radiology nursing throughout the procedure under my direct supervision. FLUOROSCOPY: Radiation Exposure Index (as provided by the fluoroscopic device): 169 mGy Kerma CONTRAST:  15  mL Omnipaque  300 COMPLICATIONS: None immediate. PROCEDURE: Informed written consent was obtained from the patient after a thorough discussion of the procedural risks, benefits and alternatives. All questions were addressed. Maximal Sterile Barrier Technique was utilized including caps, mask, sterile gowns, sterile gloves, sterile drape, hand hygiene and skin antiseptic. A timeout was performed prior to the initiation of the procedure. Right  side of the abdomen was prepped and draped in sterile fashion. Ultrasound demonstrated dilated right intrahepatic biliary ducts. A peripheral duct in the inferior right hepatic lobe was targeted. Skin was anesthetized with 1% lidocaine . Using ultrasound guidance, a 21 gauge Chiba needle was directed into the right biliary duct and contrast injection confirmed placement in the biliary system. 0.018 wire was placed. Transitional dilator set was placed. J wire was placed and a Kumpe catheter was advanced over the wire into the central intrahepatic biliary system. Additional contrast was injected. Glidewire was successfully advanced into the common hepatic duct adjacent to the existing biliary stent. Catheter and wire were advanced into the duodenum. Superstiff Amplatz wire was placed. Tract was dilated with the 10 French dilator. However, the 10 [place] biliary drain would not advance beyond the hilar obstruction. Therefore, an 14 [place] biliary drain was advanced over the wire. The 8 French drain was successfully advanced beyond the obstruction and the tip was placed in the duodenum. Pigtail was formed in the duodenum. Contrast injection confirmed placement in the biliary system. Sample of bile was sent for cytology. Drain was flushed with saline and attached to a gravity bag. FINDINGS: Ultrasound demonstrated right biliary duct dilatation. A peripheral right biliary duct was successfully cannulated and cholangiogram demonstrated dilated right intrahepatic bile ducts with a central hilar obstruction. Nonmetallic biliary stent extends into the proximal left intrahepatic bile duct region. No significant filling of the left intrahepatic bile ducts. Drain was successfully advanced into the duodenum. IMPRESSION: 1. Percutaneous transhepatic cholangiogram demonstrated a dilated right biliary system with a hilar obstruction. 2. Successful placement of internal/external biliary drain. 3. Bile sample was sent for cytology.  Electronically Signed   By: Elene Griffes M.D.   On: 03/16/2024 17:01   DG C-Arm 1-60 Min-No Report Result Date: 03/15/2024 Fluoroscopy was utilized by the requesting physician.  No radiographic interpretation.   Medications: I have reviewed the patient's current medications. Scheduled Meds:  aspirin  EC  81 mg Oral QPM   insulin  aspart  0-15 Units Subcutaneous TID WC   linagliptin  5 mg Oral QPM   nitrofurantoin  (macrocrystal-monohydrate)  100 mg Oral Daily   silodosin   8 mg Oral Q breakfast   testosterone   5 g Transdermal Daily   Continuous Infusions:  cefTRIAXone (ROCEPHIN)  IV     PRN Meds:.calcium  carbonate, ondansetron  **OR** ondansetron  (ZOFRAN ) IV   Assessment: Principal Problem:   Painless jaundice    Plan: The patient is doing well with improvement of his liver enzymes status post PTC.  The patient's cytology has not been resulted yet.  The patient and his wife have been informed of the plan to await cytology results.  I have also explained to them that there is an option of internalizing the external stent in the future.  Nothing more to do from a GI point of view at this time.  I will sign off.  Please call if any further GI concerns or questions.  We would like to thank you for the opportunity to participate in the care of Casey Peters.    LOS: 3 days   Marnee Sink,  MD.FACG 03/17/2024, 9:17 AM Pager [phone] 7am-5pm  Check AMION for 5pm -7am coverage and on weekends

## 2024-03-18 ENCOUNTER — Encounter: Payer: Self-pay | Admitting: Gastroenterology

## 2024-03-20 ENCOUNTER — Telehealth: Payer: Self-pay

## 2024-03-20 LAB — CYTOLOGY - NON PAP

## 2024-03-20 NOTE — Telephone Encounter (Signed)
 Called pt, no answer and no VM.Marland KitchenMarland Kitchen

## 2024-03-20 NOTE — Telephone Encounter (Signed)
-----   Message from Marnee Sink sent at 03/17/2024  7:51 PM EDT ----- Regarding: Ercp.  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

## 2024-03-22 ENCOUNTER — Other Ambulatory Visit: Payer: Self-pay

## 2024-03-22 ENCOUNTER — Ambulatory Visit: Payer: Self-pay | Admitting: Gastroenterology

## 2024-03-22 DIAGNOSIS — K831 Obstruction of bile duct: Secondary | ICD-10-CM

## 2024-03-22 DIAGNOSIS — R978 Other abnormal tumor markers: Secondary | ICD-10-CM

## 2024-03-23 ENCOUNTER — Telehealth: Payer: Self-pay

## 2024-03-23 NOTE — Telephone Encounter (Signed)
 Procedure clearance has been faxed to Dr Beau Bound

## 2024-03-27 NOTE — Telephone Encounter (Signed)
 Faxed x 2

## 2024-03-28 ENCOUNTER — Inpatient Hospital Stay: Attending: Internal Medicine | Admitting: Internal Medicine

## 2024-03-28 ENCOUNTER — Telehealth: Payer: Self-pay | Admitting: *Deleted

## 2024-03-28 ENCOUNTER — Inpatient Hospital Stay

## 2024-03-28 ENCOUNTER — Encounter: Payer: Self-pay | Admitting: Internal Medicine

## 2024-03-28 VITALS — BP 119/80 | HR 87 | Temp 98.3°F | Resp 19 | Ht 71.0 in | Wt 190.5 lb

## 2024-03-28 DIAGNOSIS — K5730 Diverticulosis of large intestine without perforation or abscess without bleeding: Secondary | ICD-10-CM | POA: Diagnosis not present

## 2024-03-28 DIAGNOSIS — Z9089 Acquired absence of other organs: Secondary | ICD-10-CM | POA: Diagnosis not present

## 2024-03-28 DIAGNOSIS — R748 Abnormal levels of other serum enzymes: Secondary | ICD-10-CM | POA: Insufficient documentation

## 2024-03-28 DIAGNOSIS — Z841 Family history of disorders of kidney and ureter: Secondary | ICD-10-CM | POA: Insufficient documentation

## 2024-03-28 DIAGNOSIS — R5383 Other fatigue: Secondary | ICD-10-CM | POA: Diagnosis not present

## 2024-03-28 DIAGNOSIS — R630 Anorexia: Secondary | ICD-10-CM | POA: Insufficient documentation

## 2024-03-28 DIAGNOSIS — Z79899 Other long term (current) drug therapy: Secondary | ICD-10-CM | POA: Insufficient documentation

## 2024-03-28 DIAGNOSIS — R634 Abnormal weight loss: Secondary | ICD-10-CM | POA: Diagnosis not present

## 2024-03-28 DIAGNOSIS — I110 Hypertensive heart disease with heart failure: Secondary | ICD-10-CM | POA: Diagnosis not present

## 2024-03-28 DIAGNOSIS — F17290 Nicotine dependence, other tobacco product, uncomplicated: Secondary | ICD-10-CM | POA: Insufficient documentation

## 2024-03-28 DIAGNOSIS — I2510 Atherosclerotic heart disease of native coronary artery without angina pectoris: Secondary | ICD-10-CM | POA: Diagnosis not present

## 2024-03-28 DIAGNOSIS — K831 Obstruction of bile duct: Secondary | ICD-10-CM | POA: Insufficient documentation

## 2024-03-28 DIAGNOSIS — I700 Atherosclerosis of aorta: Secondary | ICD-10-CM | POA: Insufficient documentation

## 2024-03-28 DIAGNOSIS — N400 Enlarged prostate without lower urinary tract symptoms: Secondary | ICD-10-CM | POA: Diagnosis not present

## 2024-03-28 DIAGNOSIS — Z7982 Long term (current) use of aspirin: Secondary | ICD-10-CM | POA: Insufficient documentation

## 2024-03-28 DIAGNOSIS — E785 Hyperlipidemia, unspecified: Secondary | ICD-10-CM | POA: Diagnosis not present

## 2024-03-28 DIAGNOSIS — Z79890 Hormone replacement therapy (postmenopausal): Secondary | ICD-10-CM | POA: Insufficient documentation

## 2024-03-28 DIAGNOSIS — R978 Other abnormal tumor markers: Secondary | ICD-10-CM

## 2024-03-28 DIAGNOSIS — Z881 Allergy status to other antibiotic agents status: Secondary | ICD-10-CM | POA: Insufficient documentation

## 2024-03-28 DIAGNOSIS — R110 Nausea: Secondary | ICD-10-CM | POA: Diagnosis not present

## 2024-03-28 DIAGNOSIS — Z87440 Personal history of urinary (tract) infections: Secondary | ICD-10-CM | POA: Diagnosis not present

## 2024-03-28 DIAGNOSIS — I420 Dilated cardiomyopathy: Secondary | ICD-10-CM | POA: Diagnosis not present

## 2024-03-28 LAB — COMPREHENSIVE METABOLIC PANEL WITH GFR
ALT: 241 U/L — ABNORMAL HIGH (ref 0–44)
AST: 117 U/L — ABNORMAL HIGH (ref 15–41)
Albumin: 3.5 g/dL (ref 3.5–5.0)
Alkaline Phosphatase: 229 U/L — ABNORMAL HIGH (ref 38–126)
Anion gap: 12 (ref 5–15)
BUN: 47 mg/dL — ABNORMAL HIGH (ref 8–23)
CO2: 20 mmol/L — ABNORMAL LOW (ref 22–32)
Calcium: 9.6 mg/dL (ref 8.9–10.3)
Chloride: 103 mmol/L (ref 98–111)
Creatinine, Ser: 1.18 mg/dL (ref 0.61–1.24)
GFR, Estimated: >60 mL/min (ref >60)
Glucose, Bld: 171 mg/dL — ABNORMAL HIGH (ref 70–99)
Potassium: 4.7 mmol/L (ref 3.5–5.1)
Sodium: 135 mmol/L (ref 135–145)
Total Bilirubin: 4.3 mg/dL (ref 0.0–1.2)
Total Protein: 8.2 g/dL — ABNORMAL HIGH (ref 6.5–8.1)

## 2024-03-28 LAB — CBC WITH DIFFERENTIAL/PLATELET
Abs Immature Granulocytes: 0.5 10*3/uL — ABNORMAL HIGH (ref 0.00–0.07)
Basophils Absolute: 0.2 10*3/uL — ABNORMAL HIGH (ref 0.0–0.1)
Basophils Relative: 1 %
Eosinophils Absolute: 0.4 10*3/uL (ref 0.0–0.5)
Eosinophils Relative: 3 %
HCT: 42.7 % (ref 39.0–52.0)
Hemoglobin: 14.6 g/dL (ref 13.0–17.0)
Immature Granulocytes: 4 %
Lymphocytes Relative: 16 %
Lymphs Abs: 2.2 10*3/uL (ref 0.7–4.0)
MCH: 31.1 pg (ref 26.0–34.0)
MCHC: 34.2 g/dL (ref 30.0–36.0)
MCV: 91 fL (ref 80.0–100.0)
Monocytes Absolute: 1.1 10*3/uL — ABNORMAL HIGH (ref 0.1–1.0)
Monocytes Relative: 8 %
Neutro Abs: 9.7 10*3/uL — ABNORMAL HIGH (ref 1.7–7.7)
Neutrophils Relative %: 68 %
Platelets: 378 10*3/uL (ref 150–400)
RBC: 4.69 MIL/uL (ref 4.22–5.81)
RDW: 15.9 % — ABNORMAL HIGH (ref 11.5–15.5)
WBC: 14 10*3/uL — ABNORMAL HIGH (ref 4.0–10.5)
nRBC: 0 % (ref 0.0–0.2)

## 2024-03-28 MED ORDER — ONDANSETRON HCL 8 MG PO TABS: ORAL_TABLET | ORAL | 1 refills | Status: AC

## 2024-03-28 NOTE — Assessment & Plan Note (Addendum)
#   June 2025[facility] [elevated bilirubin]- MRI/MRCP-  Moderate intrahepatic bile duct dilation with abrupt tapering at the level of the hepatic hilum. No discrete filling defect, obstructing lesion, or signal abnormality. Findings may be secondary to benign stricture or cholangiocarcinoma. A 2.2 cm mildly T2 hyperintense, enhancing focus along the medial posterior spleen is subtly seen in retrospect on prior CT examinations and only minimally increased in size, likely benign S/p ERCP [Dr.Wohl] gastroenterology-cytology atypical; IR-status post PTC-atypical cells.  CA 19-9 >900  # I long discussion with the patient regarding the significant concerns for malignancy given his ongoing weight loss and ongoing nausea-in the context of our imaging and also given elevated markers.  Recommend a PET scan ASAP.  Recommend further evaluation with surgery. Will make referral.   # Ongoing nausea-recommend Zofran .  Called in a prescription.   # CAD/ Cardiomyopathy - s/p stenting - Dr.Callwood], reasonably stable.   # Family Hx of cancer- Brother- prostate cancer- mother-breast cancer.  Recommend genetic counseling/subsequent visit.  Thank you Dr.Wohl for allowing me to participate in the care of your pleasant patient. Please do not hesitate to contact me with questions or concerns in the interim.   # DISPOSITION: # PET scan ASAP # lab- cbc/cmp; ca-19-9; CEA- # refer to Surgery intrahepatic biliary-dilation/ Kristie   # follow up TBD- Dr.B  # I reviewed the blood work- with the patient in detail; also reviewed the imaging independently [as summarized above]; and with the patient in detail.

## 2024-03-28 NOTE — Progress Notes (Signed)
 Patient wife states patient is very weak. Patient wife states patient stomach stay upset all the time, which is making his appetite very poor.

## 2024-03-28 NOTE — Progress Notes (Signed)
 [facility] CONSULT NOTE  Patient Care Team: Little Riff, MD as PCP - General (Internal Medicine) Gwyn Leos, MD as Consulting Physician (Oncology)  CHIEF COMPLAINTS/PURPOSE OF CONSULTATION: Abnormal tumor markers.    HISTORY OF PRESENTING ILLNESS: Patient ambulating-in wheel chair with assistance.  Alone/Accompanied by wife.  Casey Peters 78 y.o.  male pleasant patient with a  medical history significant of dilated cardiomyopathy, CAD RCA stent in 2023, HTN, HLD, IIDM, BPH with history of recurrent UTIs on suppressive antibiotics, who presented to the hospital because of abnormal labs (elevated liver enzymes) and CT abdomen findings.   Of note patient reported fatigue for several weeks and also complained of 8 pound weight loss in the 2 weeks preceding admission.  He complained of dark urine without any dysuria or urinary frequency.  He noticed yellowish discoloration of his eyes.  No abdominal pain or vomiting.      Workup revealed elevated liver enzymes with significant hyperbilirubinemia and intrahepatic biliary obstruction.   Patient subsequently s/p ERCP with stent placement on 03/15/2024.  IR was consulted for drain placement because of worsening hyperbilirubinemia despite ERCP with stent. S/p PTC with placement of right-sided biliary drain by Dr. Julietta Ogren (interventional radiologist) on 03/16/2024. Bilirubin down from 19.9-12.6-at the time of discharge.   Patient is here accompanied by his wife to discuss the treatment options/further management.  Patient continues to have abdominal discomfort with nausea poor appetite.  Review of Systems  Constitutional:  Positive for malaise/fatigue and weight loss. Negative for chills, diaphoresis and fever.  HENT:  Negative for nosebleeds and sore throat.   Eyes:  Negative for double vision.  Respiratory:  Negative for cough, hemoptysis, sputum production, shortness of breath and wheezing.   Cardiovascular:   Negative for chest pain, palpitations, orthopnea and leg swelling.  Gastrointestinal:  Positive for nausea. Negative for abdominal pain, blood in stool, constipation, diarrhea, heartburn, melena and vomiting.  Genitourinary:  Negative for dysuria, frequency and urgency.  Musculoskeletal:  Negative for back pain and joint pain.  Skin: Negative.  Negative for itching and rash.  Neurological:  Negative for dizziness, tingling, focal weakness, weakness and headaches.  Endo/Heme/Allergies:  Does not bruise/bleed easily.  Psychiatric/Behavioral:  Negative for depression. The patient is not nervous/anxious and does not have insomnia.     MEDICAL HISTORY:  Past Medical History:  Diagnosis Date   Cardiomegaly    CHF (congestive heart failure) (HCC)    Constipation    Diabetic polyneuropathy (HCC)    Dilated idiopathic cardiomyopathy (HCC)    Erectile dysfunction    HLD (hyperlipidemia)    HTN (hypertension)    Low testosterone     Palpitations    Pneumonia    T2DM (type 2 diabetes mellitus) (HCC)     SURGICAL HISTORY: Past Surgical History:  Procedure Laterality Date   CORONARY/GRAFT ACUTE MI REVASCULARIZATION N/A 12/27/2021   Procedure: Coronary/Graft Acute MI Revascularization;  Surgeon: Sammy Crisp, MD;  Location: [facility] INVASIVE CV LAB;  Service: Cardiovascular;  Laterality: N/A;   CYSTOSCOPY WITH URETHRAL DILATATION N/A 06/17/2021   Procedure: CYSTOSCOPY WITH URETHRAL DILATATION;  Surgeon: Geraline Knapp, MD;  Location: [facility] ORS;  Service: Urology;  Laterality: N/A;  Optilume Balloon   ERCP N/A 03/15/2024   Procedure: ERCP, WITH INTERVENTION IF INDICATED;  Surgeon: Marnee Sink, MD;  Location: [facility] ENDOSCOPY;  Service: Endoscopy;  Laterality: N/A;   HERNIA REPAIR  1998   IR BILIARY DRAIN PLACEMENT WITH CHOLANGIOGRAM  03/16/2024   LEFT HEART CATH AND  CORONARY ANGIOGRAPHY N/A 12/27/2021   Procedure: LEFT HEART CATH AND CORONARY ANGIOGRAPHY;  Surgeon: Sammy Crisp, MD;  Location: [facility]  INVASIVE CV LAB;  Service: Cardiovascular;  Laterality: N/A;   PILONIDAL CYST EXCISION     TONSILLECTOMY     URETHRA SURGERY  1995    SOCIAL HISTORY: Social History   Socioeconomic History   Marital status: Married    Spouse name: Not on file   Number of children: Not on file   Years of education: Not on file   Highest education level: Not on file  Occupational History   Not on file  Tobacco Use   Smoking status: Every Day    Types: Cigars   Smokeless tobacco: Never   Tobacco comments:    2-3 cigars per day  Vaping Use   Vaping status: Never Used  Substance and Sexual Activity   Alcohol use: No    Alcohol/week: 0.0 standard drinks of alcohol   Drug use: No   Sexual activity: Yes    Birth control/protection: None  Other Topics Concern   Not on file  Social History Narrative   Not on file   Social Drivers of Health   Financial Resource Strain: Not on file  Food Insecurity: No Food Insecurity (03/28/2024)   Hunger Vital Sign    Worried About Running Out of Food in the Last Year: Never true    Ran Out of Food in the Last Year: Never true  Transportation Needs: No Transportation Needs (03/28/2024)   PRAPARE - Administrator, Civil Service (Medical): No    Lack of Transportation (Non-Medical): No  Physical Activity: Not on file  Stress: Not on file  Social Connections: Socially Integrated (03/14/2024)   Social Connection and Isolation Panel    Frequency of Communication with Friends and Family: More than three times a week    Frequency of Social Gatherings with Friends and Family: Twice a week    Attends Religious Services: More than 4 times per year    Active Member of [employer] or Organizations: Yes    Attends Engineer, structural: More than 4 times per year    Marital Status: Married  Catering manager Violence: Not At Risk (03/28/2024)   Humiliation, Afraid, Rape, and Kick questionnaire    Fear of Current or Ex-Partner: No    Emotionally Abused: No     Physically Abused: No    Sexually Abused: No    FAMILY HISTORY: Family History  Problem Relation Age of Onset   Hematuria Father    Kidney disease Neg Hx    Prostate cancer Neg Hx     ALLERGIES:  is allergic to citalopram, sulfamethoxazole -trimethoprim , and trimethoprim .  MEDICATIONS:  Current Outpatient Medications  Medication Sig Dispense Refill   aspirin  EC 81 MG tablet Take 81 mg by mouth daily.     carvedilol  (COREG ) 6.25 MG tablet Take 6.25 mg by mouth 2 (two) times daily with a meal.     furosemide (LASIX) 40 MG tablet Take 40 mg by mouth daily.     glimepiride (AMARYL) 2 MG tablet Take 2 mg by mouth.     JANUVIA 50 MG tablet Take 1 tablet by mouth daily.     nitrofurantoin , macrocrystal-monohydrate, (MACROBID ) 100 MG capsule TAKE 1 CAPSULE BY MOUTH ONCE DAILY 90 capsule 3   ondansetron  (ZOFRAN ) 8 MG tablet One pill every 8 hours as needed for nausea/vomitting. 60 tablet 1   silodosin  (RAPAFLO ) 8 MG CAPS capsule Take 1  capsule (8 mg total) by mouth daily with breakfast. 90 capsule 3   spironolactone  (ALDACTONE ) 25 MG tablet Take 12.5 mg by mouth daily.     testosterone  (ANDROGEL ) 50 MG/5GM (1%) GEL Place 5 g onto the skin daily. Please confirm with your cardiologist before resuming     ascorbic acid (VITAMIN C) 500 MG tablet Take 500 mg by mouth daily. (Patient not taking: Reported on 03/28/2024)     cholecalciferol (VITAMIN D3) 25 MCG (1000 UNIT) tablet Take 1,000 Units by mouth daily. (Patient not taking: Reported on 03/28/2024)     citalopram (CELEXA) 20 MG tablet Take 20 mg by mouth daily. (Patient not taking: Reported on 03/28/2024)     glimepiride (AMARYL) 1 MG tablet Take 1 mg by mouth daily with breakfast. (Patient not taking: Reported on 03/28/2024)     metFORMIN  (GLUCOPHAGE ) 1000 MG tablet Take 1,000 mg by mouth 2 (two) times daily. (Patient not taking: Reported on 03/28/2024)     Zinc Gluconate 30 MG TABS Take 30 mg by mouth daily. (Patient not taking: Reported on  03/28/2024)     No current facility-administered medications for this visit.    PHYSICAL EXAMINATION:   Vitals:   03/28/24 1059  BP: 119/80  Pulse: 87  Resp: 19  Temp: 98.3 F (36.8 C)  SpO2: 99%   Filed Weights   03/28/24 1059  Weight: 190 lb 8 oz (86.4 kg)   Mild yellowish discoloration.  Appears weak.  Biliary drain in place.  Physical Exam Vitals and nursing note reviewed.  HENT:     Head: Normocephalic and atraumatic.     Mouth/Throat:     Pharynx: Oropharynx is clear.   Eyes:     Extraocular Movements: Extraocular movements intact.     Pupils: Pupils are equal, round, and reactive to light.    Cardiovascular:     Rate and Rhythm: Normal rate and regular rhythm.  Pulmonary:     Comments: Decreased breath sounds bilaterally.  Abdominal:     Palpations: Abdomen is soft.   Musculoskeletal:        General: Normal range of motion.     Cervical back: Normal range of motion.   Skin:    General: Skin is warm.   Neurological:     General: No focal deficit present.     Mental Status: He is alert and oriented to person, place, and time.   Psychiatric:        Behavior: Behavior normal.        Judgment: Judgment normal.     LABORATORY DATA:  I have reviewed the data as listed Lab Results  Component Value Date   WBC 14.0 (H) 03/28/2024   HGB 14.6 03/28/2024   HCT 42.7 03/28/2024   MCV 91.0 03/28/2024   PLT 378 03/28/2024   Recent Labs    03/15/24 0433 03/16/24 0341 03/17/24 0801  NA 134* 132* 133*  K 3.7 4.2 3.5  CL 103 98 100  CO2 22 23 26   GLUCOSE 77 244* 200*  BUN 18 19 19   CREATININE 0.57* 0.66 0.76  CALCIUM  8.7* 9.0 8.5*  GFRNONAA >60 >60 >60  PROT 6.4* 6.5 6.2*  ALBUMIN 2.8* 2.8* 2.7*  AST 167* 145* 95*  ALT 397* 345* 270*  ALKPHOS 549* 559* 475*  BILITOT 17.2* 19.9* 12.6*    RADIOGRAPHIC STUDIES: I have personally reviewed the radiological images as listed and agreed with the findings in the report. IR BILIARY DRAIN PLACEMENT  WITH CHOLANGIOGRAM Result Date:  03/16/2024 INDICATION: 78 year old with obstructive jaundice. Recent imaging demonstrates biliary dilatation. Patient underwent ERCP and a nonmetallic biliary stent was placed in the common bile duct and common hepatic duct. Right hepatic ducts were not visualized during ERCP. Findings concerning for a hilar obstruction. Left hepatic lobe is atrophic based on recent imaging. EXAM: 1. Percutaneous transhepatic cholangiogram using ultrasound and fluoroscopic guidance. 2. Placement of internal/external biliary drain MEDICATIONS: Rocephin  2 g; The antibiotic was administered within an appropriate time frame prior to the initiation of the procedure. ANESTHESIA/SEDATION: Moderate (conscious) sedation was employed during this procedure. A total of Versed  2 mg and Fentanyl  100 mcg was administered intravenously by the radiology nurse. Total intra-service moderate Sedation Time: 43 minutes. The patient's level of consciousness and vital signs were monitored continuously by radiology nursing throughout the procedure under my direct supervision. FLUOROSCOPY: Radiation Exposure Index (as provided by the fluoroscopic device): 169 mGy Kerma CONTRAST:  15 mL Omnipaque  300 COMPLICATIONS: None immediate. PROCEDURE: Informed written consent was obtained from the patient after a thorough discussion of the procedural risks, benefits and alternatives. All questions were addressed. Maximal Sterile Barrier Technique was utilized including caps, mask, sterile gowns, sterile gloves, sterile drape, hand hygiene and skin antiseptic. A timeout was performed prior to the initiation of the procedure. Right side of the abdomen was prepped and draped in sterile fashion. Ultrasound demonstrated dilated right intrahepatic biliary ducts. A peripheral duct in the inferior right hepatic lobe was targeted. Skin was anesthetized with 1% lidocaine . Using ultrasound guidance, a 21 gauge Chiba needle was directed into the  right biliary duct and contrast injection confirmed placement in the biliary system. 0.018 wire was placed. Transitional dilator set was placed. J wire was placed and a Kumpe catheter was advanced over the wire into the central intrahepatic biliary system. Additional contrast was injected. Glidewire was successfully advanced into the common hepatic duct adjacent to the existing biliary stent. Catheter and wire were advanced into the duodenum. Superstiff Amplatz wire was placed. Tract was dilated with the 10 French dilator. However, the 10 [place] biliary drain would not advance beyond the hilar obstruction. Therefore, an 48 [place] biliary drain was advanced over the wire. The 8 French drain was successfully advanced beyond the obstruction and the tip was placed in the duodenum. Pigtail was formed in the duodenum. Contrast injection confirmed placement in the biliary system. Sample of bile was sent for cytology. Drain was flushed with saline and attached to a gravity bag. FINDINGS: Ultrasound demonstrated right biliary duct dilatation. A peripheral right biliary duct was successfully cannulated and cholangiogram demonstrated dilated right intrahepatic bile ducts with a central hilar obstruction. Nonmetallic biliary stent extends into the proximal left intrahepatic bile duct region. No significant filling of the left intrahepatic bile ducts. Drain was successfully advanced into the duodenum. IMPRESSION: 1. Percutaneous transhepatic cholangiogram demonstrated a dilated right biliary system with a hilar obstruction. 2. Successful placement of internal/external biliary drain. 3. Bile sample was sent for cytology. Electronically Signed   By: Elene Griffes M.D.   On: 03/16/2024 17:01   DG C-Arm 1-60 Min-No Report Result Date: 03/15/2024 Fluoroscopy was utilized by the requesting physician.  No radiographic interpretation.   MR ABDOMEN MRCP W WO CONTAST Result Date: 03/14/2024 CLINICAL DATA:  Further evaluation of  intrahepatic bile duct dilation. Nausea. EXAM: MRI ABDOMEN WITHOUT AND WITH CONTRAST (INCLUDING MRCP) TECHNIQUE: Multiplanar multisequence MR imaging of the abdomen was performed both before and after the administration of intravenous contrast. Heavily T2-weighted images of the biliary  and pancreatic ducts were obtained. Post-processing was applied at the acquisition scanner with concurrent physician supervision which includes 3D reconstructions, MIPs, volume rendered images and/or shaded surface rendering. CONTRAST:  10mL GADAVIST  GADOBUTROL  1 MMOL/ML IV SOLN COMPARISON:  CT abdomen and pelvis dated 03/10/2024 FINDINGS: Lower chest: No acute findings. Hepatobiliary: Atrophic left hepatic lobe. Moderate intrahepatic bile duct dilation with abrupt tapering at the level of the hepatic hilum (14:39). The common bile duct is nondilated, also demonstrating tapering at the hepatic hilum. No discrete filling defect, obstructing lesion, or signal abnormality. Underdistended gallbladder. Pancreas: No mass, inflammatory changes, or other parenchymal abnormality identified. Spleen: 2.2 x 1.8 cm mildly T2 hyperintense, enhancing focus along the medial posterior spleen (9:15) is subtly seen in retrospect on prior CT examinations and only minimally increased in size, likely benign such as a venous malformation or hamartoma. Adrenals/Urinary Tract: No adrenal nodules. No suspicious renal masses identified. No evidence of hydronephrosis. Bilateral simple cysts. No specific follow-up imaging recommended. Stomach/Bowel: Colonic diverticulosis without acute diverticulitis. Vascular/Lymphatic: No pathologically enlarged lymph nodes identified. No abdominal aortic aneurysm demonstrated. Aortic atherosclerosis. Other:  None. Musculoskeletal: No suspicious bone lesions identified. IMPRESSION: 1. Moderate intrahepatic bile duct dilation with abrupt tapering at the level of the hepatic hilum. No discrete filling defect, obstructing lesion,  or signal abnormality. Findings may be secondary to benign stricture or cholangiocarcinoma. Recommend consultation to gastroenterology. 2. Colonic diverticulosis without acute diverticulitis. 3. A 2.2 cm mildly T2 hyperintense, enhancing focus along the medial posterior spleen is subtly seen in retrospect on prior CT examinations and only minimally increased in size, likely benign such as a venous malformation or hamartoma. 4. Aortic Atherosclerosis (ICD10-I70.0). Electronically Signed   By: Limin  Xu M.D.   On: 03/14/2024 15:45   MR 3D Recon At Scanner Result Date: 03/14/2024 CLINICAL DATA:  Further evaluation of intrahepatic bile duct dilation. Nausea. EXAM: MRI ABDOMEN WITHOUT AND WITH CONTRAST (INCLUDING MRCP) TECHNIQUE: Multiplanar multisequence MR imaging of the abdomen was performed both before and after the administration of intravenous contrast. Heavily T2-weighted images of the biliary and pancreatic ducts were obtained. Post-processing was applied at the acquisition scanner with concurrent physician supervision which includes 3D reconstructions, MIPs, volume rendered images and/or shaded surface rendering. CONTRAST:  10mL GADAVIST  GADOBUTROL  1 MMOL/ML IV SOLN COMPARISON:  CT abdomen and pelvis dated 03/10/2024 FINDINGS: Lower chest: No acute findings. Hepatobiliary: Atrophic left hepatic lobe. Moderate intrahepatic bile duct dilation with abrupt tapering at the level of the hepatic hilum (14:39). The common bile duct is nondilated, also demonstrating tapering at the hepatic hilum. No discrete filling defect, obstructing lesion, or signal abnormality. Underdistended gallbladder. Pancreas: No mass, inflammatory changes, or other parenchymal abnormality identified. Spleen: 2.2 x 1.8 cm mildly T2 hyperintense, enhancing focus along the medial posterior spleen (9:15) is subtly seen in retrospect on prior CT examinations and only minimally increased in size, likely benign such as a venous malformation or  hamartoma. Adrenals/Urinary Tract: No adrenal nodules. No suspicious renal masses identified. No evidence of hydronephrosis. Bilateral simple cysts. No specific follow-up imaging recommended. Stomach/Bowel: Colonic diverticulosis without acute diverticulitis. Vascular/Lymphatic: No pathologically enlarged lymph nodes identified. No abdominal aortic aneurysm demonstrated. Aortic atherosclerosis. Other:  None. Musculoskeletal: No suspicious bone lesions identified. IMPRESSION: 1. Moderate intrahepatic bile duct dilation with abrupt tapering at the level of the hepatic hilum. No discrete filling defect, obstructing lesion, or signal abnormality. Findings may be secondary to benign stricture or cholangiocarcinoma. Recommend consultation to gastroenterology. 2. Colonic diverticulosis without acute diverticulitis. 3. A 2.2  cm mildly T2 hyperintense, enhancing focus along the medial posterior spleen is subtly seen in retrospect on prior CT examinations and only minimally increased in size, likely benign such as a venous malformation or hamartoma. 4. Aortic Atherosclerosis (ICD10-I70.0). Electronically Signed   By: Limin  Xu M.D.   On: 03/14/2024 15:45   CT ABDOMEN PELVIS W CONTRAST Result Date: 03/10/2024 EXAMINATION: CT ABDOMEN PELVIS W CONTRAST CLINICAL INDICATION: Male, 78 years old. Nausea and vomiting TECHNIQUE: Axial CT of the abdomen and pelvis with 100 cc Omnipaque  300 intravenous contrast. Multiplanar reformations provided. Unless otherwise specified, incidental thyroid, adrenal, renal lesions do not require dedicated imaging follow up. Additionally, any mentioned pulmonary nodules do not require dedicated imaging follow-up based on the Fleischner guidelines unless otherwise specified. Coronary calcifications are not identified unless otherwise specified. COMPARISON: 07/19/2015 FINDINGS: There are minimal atelectatic changes in the lung bases. The heart is normal in size. There are coronary calcifications. There  is new mild intrahepatic biliary ductal dilatation predominantly affecting the left lobe. The gallbladder is normal. The spleen is normal. The pancreas appears normal. The adrenals are normal. The right kidney is normal. Left renal cyst is seen. Abdominal aorta is normal in caliber. Scattered atherosclerotic changes are present. Bladder is normal. The prostate is enlarged. Large and small bowel loops are otherwise within normal limits. There is no free fluid or pathologic lymphadenopathy by size criteria. There are degenerative changes of the spine and bony pelvis. IMPRESSION: New mild intrahepatic biliary ductal dilatation predominantly affecting the left lobe. Recommend further evaluation with multiphase MRI of the abdomen to include MRCP sequencing. DOSE REDUCTION: This exam was performed according to our departmental dose-optimization program which includes automated exposure control, adjustment of the mA and/or kV according to patient size and/or use of iterative reconstruction technique. Electronically signed by: [place] Engel MD 03/10/2024 05:30 PM EDT RP Workstation: [id number]     Elevated tumor markers # [facility] [elevated bilirubin]- MRI/MRCP-  Moderate intrahepatic bile duct dilation with abrupt tapering at the level of the hepatic hilum. No discrete filling defect, obstructing lesion, or signal abnormality. Findings may be secondary to benign stricture or cholangiocarcinoma. A 2.2 cm mildly T2 hyperintense, enhancing focus along the medial posterior spleen is subtly seen in retrospect on prior CT examinations and only minimally increased in size, likely benign S/p ERCP [Dr.Wohl] gastroenterology-cytology atypical; IR-status post PTC-atypical cells.  CA 19-9 >900  # I long discussion with the patient regarding the significant concerns for malignancy given his ongoing weight loss and ongoing nausea-in the context of our imaging and also given elevated markers.  Recommend a PET scan ASAP.   Recommend further evaluation with surgery. Will make referral.   # Ongoing nausea-recommend Zofran .  Called in a prescription.   # CAD/ Cardiomyopathy - s/p stenting - Dr.Callwood], reasonably stable.   # Family Hx of cancer- Brother- prostate cancer- mother-breast cancer.  Recommend genetic counseling/subsequent visit.  Thank you Dr.Wohl for allowing me to participate in the care of your pleasant patient. Please do not hesitate to contact me with questions or concerns in the interim.   # DISPOSITION: # PET scan ASAP # lab- [facility]; ca-19-9; [facility]- # refer to Surgery intrahepatic biliary-dilation/ Kristie   # follow up TBD- Dr.B  # I reviewed the blood work- with the patient in detail; also reviewed the imaging independently [as summarized above]; and with the patient in detail.   Above plan of care was discussed with patient/family in detail.  My contact information was given  to the patient/family.     Gwyn Leos, MD 03/28/2024 12:16 PM

## 2024-03-28 NOTE — Telephone Encounter (Signed)
 Critical Lab called by Burdette Carolin in lab; Total bilirubin 4.3 Readback. MD notified.

## 2024-03-28 NOTE — Progress Notes (Signed)
 Provided nurse navigator information. Provided further education on PET scan. Pending scheduling. Discussed options for surgical referral. Jonette Nestle is preference. Referral sent to Dr. Karleen Overall at [facility]. Escorted to lab.

## 2024-03-29 ENCOUNTER — Telehealth: Payer: Self-pay

## 2024-03-29 ENCOUNTER — Other Ambulatory Visit: Payer: Self-pay

## 2024-03-29 ENCOUNTER — Other Ambulatory Visit: Payer: Self-pay | Admitting: *Deleted

## 2024-03-29 LAB — CEA: CEA: 3.3 ng/mL (ref 0.0–4.7)

## 2024-03-29 LAB — CANCER ANTIGEN 19-9: CA 19-9: 509 U/mL — ABNORMAL HIGH (ref 0–35)

## 2024-03-29 NOTE — Telephone Encounter (Signed)
 Called and spoke with Leary Provencal, spouse. Notified that bilirubin continues to trend down. 4.3,  but not normal. PET scan has been scheduled. Reviewed instructions. Encouraged to call with any questions.

## 2024-03-30 NOTE — Telephone Encounter (Signed)
 Faxed x 2

## 2024-04-03 ENCOUNTER — Telehealth: Payer: Self-pay

## 2024-04-03 ENCOUNTER — Ambulatory Visit

## 2024-04-03 NOTE — Telephone Encounter (Signed)
 Casey Peters returned call. She stated she had not answered when [facility] called because they thought it was [facility]. Educated that [facility] is affiliated with [facility] and provided the number to call to make the appointment.

## 2024-04-03 NOTE — Telephone Encounter (Signed)
 [facility] has documented that they were unable to contact for scheduling. Called and left local phone number for scheduling on voicemail.

## 2024-04-05 ENCOUNTER — Telehealth: Payer: Self-pay

## 2024-04-05 ENCOUNTER — Ambulatory Visit

## 2024-04-05 ENCOUNTER — Inpatient Hospital Stay: Admitting: Hospice and Palliative Medicine

## 2024-04-05 DIAGNOSIS — G893 Neoplasm related pain (acute) (chronic): Secondary | ICD-10-CM

## 2024-04-05 DIAGNOSIS — R978 Other abnormal tumor markers: Secondary | ICD-10-CM

## 2024-04-05 DIAGNOSIS — Z515 Encounter for palliative care: Secondary | ICD-10-CM

## 2024-04-05 DIAGNOSIS — K219 Gastro-esophageal reflux disease without esophagitis: Secondary | ICD-10-CM

## 2024-04-05 MED ORDER — NALOXONE HCL 4 MG/0.1ML NA LIQD
NASAL | 0 refills | Status: DC
Start: 2024-04-05 — End: 2024-04-24

## 2024-04-05 MED ORDER — OXYCODONE HCL 5 MG PO TABS: 5.0000 mg | ORAL_TABLET | Freq: Three times a day (TID) | ORAL | 0 refills | Status: AC | PRN

## 2024-04-05 MED ORDER — SENNA 8.6 MG PO TABS: 1.0000 | ORAL_TABLET | Freq: Every day | ORAL | 0 refills | Status: DC | PRN

## 2024-04-05 MED ORDER — OMEPRAZOLE 20 MG PO CPDR: 20.0000 mg | DELAYED_RELEASE_CAPSULE | Freq: Every day | ORAL | 3 refills | Status: DC

## 2024-04-05 NOTE — Telephone Encounter (Signed)
 Please advise about pain.    Spoke with patient's wife who reports that Casey Peters started complaining of back pain a few days ago.  She is unable to provide pain scale or exact location of pain and patient is asleep at this time.  She just knows that it must be pretty bad because he told her to call the office for advice.  She feels that the pain is coming from him only being able to sit in recliner or lie in the bed.

## 2024-04-05 NOTE — Progress Notes (Signed)
 Virtual Visit via Telephone Note  I connected with Casey Peters on 04/05/24 at  3:20 PM EDT by telephone and verified that I am speaking with the correct person using two identifiers.  Location: Patient: Home Provider: Clinic   I discussed the limitations, risks, security and privacy concerns of performing an evaluation and management service by telephone and the availability of in person appointments. I also discussed with the patient that there may be a patient responsible charge related to this service. The patient expressed understanding and agreed to proceed.   History of Present Illness: Casey Peters is a 78 year old man with multiple medical problems including history of dilated cardiomyopathy, CAD status post stenting, HTN, HLD, DM, history of recurrent UTIs on suppressive antibiotics.  Patient was recently found to have hyperbilirubinemia and elevated transaminases with findings of biliary obstruction.  He underwent ERCP with stenting on 03/15/2024 and biliary drain placement on 03/16/2024.   Observations/Objective: Patient saw Dr. Rennie on 03/28/2024 with concerns for malignancy given ongoing weight loss/nausea.  CA 19-9 was greater than 900.  Patient was referred to general surgery and PET scan was ordered for further workup.  I was asked to reach out to the patient today to discuss pain management.  I spoke with patient and wife by phone.  Reportedly, patient has had burning pain in his stomach that is somewhat improved by taking Tums.  However, he also endorses back pain, which he attributes to decreased mobility and increased sitting in the chair/bed.  Patient also describes constipation but is not currently taking anything.  Discussed symptom management options in detail.  I recommended conservative management such as adding daily omeprazole, trial of an OTC lidocaine  patch for his back pain, and use of a daily bowel regimen such as MiraLAX and Senokot.  However,  patient's wife was interested in me prescribing low-dose oxycodone, which patient has tolerated in the past, in the event that pain is not otherwise adequately managed with OTC medications.  Will be cautious with oxycodone dosing in light of his liver dysfunction.  Assessment and Plan: Abnormal tumor markers -patient being worked up for presumed malignancy.  He is pending PET scan.  GERD -start daily omeprazole  Neoplasm related pain - PDMP reviewed.  May trial OTC lidocaine  patch for back pain.  Patient also seems to be having some burning stomach pain and I am hopeful that omeprazole will help with that.  However, he can utilize oxycodone 5 mg every 8 hours as needed if pain does not improve.  Naloxone prescribed for emergency use.  Daily bowel regimen with MiraLAX/Senokot to prevent opioid-induced constipation.  Safe storage and administration of pain medications discussed.  Case and plan discussed with Dr. Rennie  Follow Up Instructions: Follow-up telephone visit 2 to 3 weeks   I discussed the assessment and treatment plan with the patient. The patient was provided an opportunity to ask questions and all were answered. The patient agreed with the plan and demonstrated an understanding of the instructions.   The patient was advised to call back or seek an in-person evaluation if the symptoms worsen or if the condition fails to improve as anticipated.  I provided 20 minutes of non-face-to-face time during this encounter.   FONDA JONELLE MOWER, NP

## 2024-04-05 NOTE — Telephone Encounter (Signed)
 Received voicemail from spouse, Clarita, asking for pain medication for his back. She is requesting a call back.

## 2024-04-06 ENCOUNTER — Encounter

## 2024-04-06 NOTE — Telephone Encounter (Signed)
 Clearance received from Dr Florencio. Pt should stop Aspirin  7 days frior nd restart 2 days after. Form sent to be scanned

## 2024-04-10 ENCOUNTER — Telehealth: Payer: Self-pay

## 2024-04-10 NOTE — Telephone Encounter (Signed)
 Called spouse, Clarita, and notified of PET being rescheduled to 04/12/24 at 0830 with arrival time of 0800 at the medical mall entrance. She has all of the instructions for the scan. She has not made appointment with surgeon. She was instructed to call and make appointment now that pet is scheduled. She verbalized understanding.

## 2024-04-10 NOTE — Telephone Encounter (Signed)
 Spoke to spouse and she expressed understanding about holding medications

## 2024-04-12 ENCOUNTER — Ambulatory Visit
Admission: RE | Admit: 2024-04-12 | Discharge: 2024-04-12 | Disposition: A | Discharge status: Home / Self Care | Source: Ambulatory Visit | Attending: Internal Medicine | Admitting: Internal Medicine

## 2024-04-12 DIAGNOSIS — R251 Tremor, unspecified: Secondary | ICD-10-CM | POA: Diagnosis not present

## 2024-04-12 DIAGNOSIS — R911 Solitary pulmonary nodule: Secondary | ICD-10-CM | POA: Diagnosis not present

## 2024-04-12 DIAGNOSIS — E119 Type 2 diabetes mellitus without complications: Secondary | ICD-10-CM | POA: Diagnosis not present

## 2024-04-12 DIAGNOSIS — C259 Malignant neoplasm of pancreas, unspecified: Secondary | ICD-10-CM | POA: Insufficient documentation

## 2024-04-12 DIAGNOSIS — R978 Other abnormal tumor markers: Secondary | ICD-10-CM | POA: Insufficient documentation

## 2024-04-12 DIAGNOSIS — C221 Intrahepatic bile duct carcinoma: Secondary | ICD-10-CM | POA: Insufficient documentation

## 2024-04-12 LAB — GLUCOSE, CAPILLARY: Glucose-Capillary: 108 mg/dL — ABNORMAL HIGH (ref 70–99)

## 2024-04-12 MED ORDER — FLUDEOXYGLUCOSE F - 18 (FDG) INJECTION
10.5000 | Freq: Once | INTRAVENOUS | Status: AC | PRN
Start: 2024-04-12 — End: 2024-04-12
  Administered 2024-04-12: 10.5 via INTRAVENOUS

## 2024-04-13 ENCOUNTER — Other Ambulatory Visit

## 2024-04-17 ENCOUNTER — Encounter: Payer: Self-pay | Admitting: Gastroenterology

## 2024-04-17 ENCOUNTER — Ambulatory Visit: Payer: Self-pay | Admitting: Gastroenterology

## 2024-04-17 ENCOUNTER — Ambulatory Visit

## 2024-04-17 ENCOUNTER — Inpatient Hospital Stay
Admission: EM | Admit: 2024-04-17 | Discharge: 2024-04-24 | DRG: 871 | Disposition: A | Discharge status: Home Health | Attending: Student | Admitting: Student

## 2024-04-17 ENCOUNTER — Telehealth: Payer: Self-pay

## 2024-04-17 ENCOUNTER — Other Ambulatory Visit: Payer: Self-pay

## 2024-04-17 ENCOUNTER — Encounter: Payer: Self-pay | Admitting: Emergency Medicine

## 2024-04-17 ENCOUNTER — Ambulatory Visit (HOSPITAL_BASED_OUTPATIENT_CLINIC_OR_DEPARTMENT_OTHER)
Admission: RE | Admit: 2024-04-17 | Discharge: 2024-04-17 | Disposition: A | Discharge status: Home / Self Care | Source: Home / Self Care | Attending: Gastroenterology | Admitting: Gastroenterology

## 2024-04-17 ENCOUNTER — Encounter
Admission: RE | Disposition: A | Discharge status: Home / Self Care | Payer: Self-pay | Source: Home / Self Care | Attending: Gastroenterology

## 2024-04-17 ENCOUNTER — Ambulatory Visit: Admitting: Anesthesiology

## 2024-04-17 DIAGNOSIS — I2510 Atherosclerotic heart disease of native coronary artery without angina pectoris: Secondary | ICD-10-CM | POA: Diagnosis present

## 2024-04-17 DIAGNOSIS — F17200 Nicotine dependence, unspecified, uncomplicated: Secondary | ICD-10-CM | POA: Insufficient documentation

## 2024-04-17 DIAGNOSIS — J189 Pneumonia, unspecified organism: Secondary | ICD-10-CM

## 2024-04-17 DIAGNOSIS — E876 Hypokalemia: Secondary | ICD-10-CM | POA: Diagnosis present

## 2024-04-17 DIAGNOSIS — I509 Heart failure, unspecified: Secondary | ICD-10-CM | POA: Insufficient documentation

## 2024-04-17 DIAGNOSIS — B9620 Unspecified Escherichia coli [E. coli] as the cause of diseases classified elsewhere: Secondary | ICD-10-CM

## 2024-04-17 DIAGNOSIS — I110 Hypertensive heart disease with heart failure: Secondary | ICD-10-CM | POA: Diagnosis present

## 2024-04-17 DIAGNOSIS — I252 Old myocardial infarction: Secondary | ICD-10-CM

## 2024-04-17 DIAGNOSIS — F17290 Nicotine dependence, other tobacco product, uncomplicated: Secondary | ICD-10-CM | POA: Diagnosis present

## 2024-04-17 DIAGNOSIS — Z4659 Encounter for fitting and adjustment of other gastrointestinal appliance and device: Secondary | ICD-10-CM | POA: Insufficient documentation

## 2024-04-17 DIAGNOSIS — K831 Obstruction of bile duct: Secondary | ICD-10-CM

## 2024-04-17 DIAGNOSIS — Z883 Allergy status to other anti-infective agents status: Secondary | ICD-10-CM

## 2024-04-17 DIAGNOSIS — Z955 Presence of coronary angioplasty implant and graft: Secondary | ICD-10-CM

## 2024-04-17 DIAGNOSIS — R6521 Severe sepsis with septic shock: Secondary | ICD-10-CM | POA: Diagnosis present

## 2024-04-17 DIAGNOSIS — A4151 Sepsis due to Escherichia coli [E. coli]: Secondary | ICD-10-CM | POA: Diagnosis not present

## 2024-04-17 DIAGNOSIS — E119 Type 2 diabetes mellitus without complications: Secondary | ICD-10-CM | POA: Insufficient documentation

## 2024-04-17 DIAGNOSIS — K2980 Duodenitis without bleeding: Secondary | ICD-10-CM | POA: Diagnosis not present

## 2024-04-17 DIAGNOSIS — Z66 Do not resuscitate: Secondary | ICD-10-CM | POA: Diagnosis present

## 2024-04-17 DIAGNOSIS — K219 Gastro-esophageal reflux disease without esophagitis: Secondary | ICD-10-CM | POA: Diagnosis present

## 2024-04-17 DIAGNOSIS — Z888 Allergy status to other drugs, medicaments and biological substances status: Secondary | ICD-10-CM

## 2024-04-17 DIAGNOSIS — I420 Dilated cardiomyopathy: Secondary | ICD-10-CM | POA: Diagnosis present

## 2024-04-17 DIAGNOSIS — I5022 Chronic systolic (congestive) heart failure: Secondary | ICD-10-CM | POA: Diagnosis present

## 2024-04-17 DIAGNOSIS — A419 Sepsis, unspecified organism: Principal | ICD-10-CM | POA: Diagnosis present

## 2024-04-17 DIAGNOSIS — Z951 Presence of aortocoronary bypass graft: Secondary | ICD-10-CM

## 2024-04-17 DIAGNOSIS — E785 Hyperlipidemia, unspecified: Secondary | ICD-10-CM | POA: Diagnosis present

## 2024-04-17 DIAGNOSIS — R911 Solitary pulmonary nodule: Secondary | ICD-10-CM | POA: Diagnosis present

## 2024-04-17 DIAGNOSIS — Z7984 Long term (current) use of oral hypoglycemic drugs: Secondary | ICD-10-CM

## 2024-04-17 DIAGNOSIS — E872 Acidosis, unspecified: Secondary | ICD-10-CM | POA: Diagnosis present

## 2024-04-17 DIAGNOSIS — N400 Enlarged prostate without lower urinary tract symptoms: Secondary | ICD-10-CM | POA: Diagnosis present

## 2024-04-17 DIAGNOSIS — R7881 Bacteremia: Secondary | ICD-10-CM

## 2024-04-17 DIAGNOSIS — E1142 Type 2 diabetes mellitus with diabetic polyneuropathy: Secondary | ICD-10-CM | POA: Diagnosis present

## 2024-04-17 DIAGNOSIS — Z882 Allergy status to sulfonamides status: Secondary | ICD-10-CM

## 2024-04-17 DIAGNOSIS — R6520 Severe sepsis without septic shock: Secondary | ICD-10-CM | POA: Diagnosis present

## 2024-04-17 DIAGNOSIS — J440 Chronic obstructive pulmonary disease with acute lower respiratory infection: Secondary | ICD-10-CM | POA: Diagnosis present

## 2024-04-17 DIAGNOSIS — J9601 Acute respiratory failure with hypoxia: Secondary | ICD-10-CM

## 2024-04-17 DIAGNOSIS — R509 Fever, unspecified: Secondary | ICD-10-CM

## 2024-04-17 DIAGNOSIS — K769 Liver disease, unspecified: Secondary | ICD-10-CM | POA: Diagnosis present

## 2024-04-17 DIAGNOSIS — Z79899 Other long term (current) drug therapy: Secondary | ICD-10-CM

## 2024-04-17 DIAGNOSIS — N179 Acute kidney failure, unspecified: Secondary | ICD-10-CM

## 2024-04-17 DIAGNOSIS — Z7982 Long term (current) use of aspirin: Secondary | ICD-10-CM

## 2024-04-17 DIAGNOSIS — R54 Age-related physical debility: Secondary | ICD-10-CM | POA: Diagnosis present

## 2024-04-17 DIAGNOSIS — Z87440 Personal history of urinary (tract) infections: Secondary | ICD-10-CM

## 2024-04-17 DIAGNOSIS — Y95 Nosocomial condition: Secondary | ICD-10-CM | POA: Diagnosis present

## 2024-04-17 HISTORY — PX: ERCP: SHX5425

## 2024-04-17 LAB — CBC WITH DIFFERENTIAL/PLATELET
Abs Immature Granulocytes: 0.19 K/uL — ABNORMAL HIGH (ref 0.00–0.07)
Basophils Absolute: 0.1 K/uL (ref 0.0–0.1)
Basophils Relative: 0 %
Eosinophils Absolute: 0.1 K/uL (ref 0.0–0.5)
Eosinophils Relative: 0 %
HCT: 38.5 % — ABNORMAL LOW (ref 39.0–52.0)
Hemoglobin: 13.2 g/dL (ref 13.0–17.0)
Immature Granulocytes: 1 %
Lymphocytes Relative: 5 %
Lymphs Abs: 1 K/uL (ref 0.7–4.0)
MCH: 32.4 pg (ref 26.0–34.0)
MCHC: 34.3 g/dL (ref 30.0–36.0)
MCV: 94.4 fL (ref 80.0–100.0)
Monocytes Absolute: 1.6 K/uL — ABNORMAL HIGH (ref 0.1–1.0)
Monocytes Relative: 8 %
Neutro Abs: 15.8 K/uL — ABNORMAL HIGH (ref 1.7–7.7)
Neutrophils Relative %: 86 %
Platelets: 196 K/uL (ref 150–400)
RBC: 4.08 MIL/uL — ABNORMAL LOW (ref 4.22–5.81)
RDW: 15.8 % — ABNORMAL HIGH (ref 11.5–15.5)
WBC: 18.7 K/uL — ABNORMAL HIGH (ref 4.0–10.5)
nRBC: 0 % (ref 0.0–0.2)

## 2024-04-17 LAB — COMPREHENSIVE METABOLIC PANEL WITH GFR
ALT: 85 U/L — ABNORMAL HIGH (ref 0–44)
ALT: 88 U/L — ABNORMAL HIGH (ref 0–44)
AST: 33 U/L (ref 15–41)
AST: 37 U/L (ref 15–41)
Albumin: 3.1 g/dL — ABNORMAL LOW (ref 3.5–5.0)
Albumin: 3.2 g/dL — ABNORMAL LOW (ref 3.5–5.0)
Alkaline Phosphatase: 114 U/L (ref 38–126)
Alkaline Phosphatase: 120 U/L (ref 38–126)
Anion gap: 10 (ref 5–15)
Anion gap: 12 (ref 5–15)
BUN: 33 mg/dL — ABNORMAL HIGH (ref 8–23)
BUN: 34 mg/dL — ABNORMAL HIGH (ref 8–23)
CO2: 18 mmol/L — ABNORMAL LOW (ref 22–32)
CO2: 22 mmol/L (ref 22–32)
Calcium: 8.8 mg/dL — ABNORMAL LOW (ref 8.9–10.3)
Calcium: 9 mg/dL (ref 8.9–10.3)
Chloride: 106 mmol/L (ref 98–111)
Chloride: 106 mmol/L (ref 98–111)
Creatinine, Ser: 1.24 mg/dL (ref 0.61–1.24)
Creatinine, Ser: 1.45 mg/dL — ABNORMAL HIGH (ref 0.61–1.24)
GFR, Estimated: 50 mL/min — ABNORMAL LOW (ref >60)
GFR, Estimated: >60 mL/min (ref >60)
Glucose, Bld: 163 mg/dL — ABNORMAL HIGH (ref 70–99)
Glucose, Bld: 192 mg/dL — ABNORMAL HIGH (ref 70–99)
Potassium: 4 mmol/L (ref 3.5–5.1)
Potassium: 4.3 mmol/L (ref 3.5–5.1)
Sodium: 136 mmol/L (ref 135–145)
Sodium: 138 mmol/L (ref 135–145)
Total Bilirubin: 1.7 mg/dL — ABNORMAL HIGH (ref 0.0–1.2)
Total Bilirubin: 1.8 mg/dL — ABNORMAL HIGH (ref 0.0–1.2)
Total Protein: 7.4 g/dL (ref 6.5–8.1)
Total Protein: 7.8 g/dL (ref 6.5–8.1)

## 2024-04-17 LAB — GLUCOSE, CAPILLARY: Glucose-Capillary: 131 mg/dL — ABNORMAL HIGH (ref 70–99)

## 2024-04-17 SURGERY — ERCP, WITH INTERVENTION IF INDICATED
Anesthesia: General

## 2024-04-17 MED ORDER — LIDOCAINE HCL (CARDIAC) PF 100 MG/5ML IV SOSY
PREFILLED_SYRINGE | INTRAVENOUS | Status: DC | PRN
  Administered 2024-04-17: 100 mg via INTRAVENOUS

## 2024-04-17 MED ORDER — PROPOFOL 500 MG/50ML IV EMUL
INTRAVENOUS | Status: DC | PRN
  Administered 2024-04-17: 100 ug/kg/min via INTRAVENOUS

## 2024-04-17 MED ORDER — LACTATED RINGERS IV SOLN: INTRAVENOUS | Status: DC

## 2024-04-17 MED ORDER — PROPOFOL 10 MG/ML IV BOLUS
INTRAVENOUS | Status: DC | PRN
  Administered 2024-04-17: 80 mg via INTRAVENOUS
  Administered 2024-04-17: 40 mg via INTRAVENOUS

## 2024-04-17 NOTE — Op Note (Signed)
 [facility] Patient Name: Casey Peters Procedure Date: 04/17/2024 10:54 AM MRN: [id number] Account #: [id number] Date of Birth: [birth year] Admit Type: Outpatient Age: 78 Room: [facility] ENDO ROOM 4 Gender: Male Note Status: Finalized Instrument Name: [id number] Procedure:             ERCP Indications:           Stent change Providers:             Rogelia Copping MD, MD Referring MD:          Norleen RONAL Louder, MD (Referring MD) Medicines:             Propofol  per Anesthesia Complications:         No immediate complications. Procedure:             Pre-Anesthesia Assessment:                        - Prior to the procedure, a History and Physical was                         performed, and patient medications and allergies were                         reviewed. The patient's tolerance of previous                         anesthesia was also reviewed. The risks and benefits                         of the procedure and the sedation options and risks                         were discussed with the patient. All questions were                         answered, and informed consent was obtained. Prior                         Anticoagulants: The patient has taken no anticoagulant                         or antiplatelet agents. ASA Grade Assessment: III - A                         patient with severe systemic disease. After reviewing                         the risks and benefits, the patient was deemed in                         satisfactory condition to undergo the procedure.                        After obtaining informed consent, the scope was passed                         under direct vision. Throughout the procedure, the  patient's blood pressure, pulse, and oxygen                         saturations were monitored continuously. The                         Duodenoscope was introduced through the mouth, and                          used to inject contrast into and used to inject                         contrast into the bile duct. The ERCP was accomplished                         without difficulty. The patient tolerated the                         procedure well. Findings:      A scout film of the abdomen was obtained. Percutaneous drain(s) and       stent(s) were seen. One stent was removed from the biliary tree using a       snare. The bile duct was deeply cannulated with the short-nosed traction       sphincterotome. Contrast was injected. I personally interpreted the bile       duct images. There was brisk flow of contrast through the ducts. Image       quality was excellent. Contrast extended to the entire biliary tree. The       entire biliary tree contained multiple diffuse stenoses. Cells for       cytology were obtained by brushing in the hepatic duct bifurcation.       Ampulla was biopsied with a cold forceps for histology. One 12 Fr by 7       cm plastic stent with a single external flap and a single internal flap       was placed 10 cm into the common bile duct. Bile flowed through the       stent. The stent was in good position. 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                        - One stent was removed from the biliary tree.                        - Cells for cytology obtained at the hepatic duct                         bifurcation.                        - Biopsy was performed Ampulla.                        - One plastic stent was placed into the common bile  duct. Recommendation:        - Discharge patient to home.                        - Resume previous diet.                        - Await cytology results.                        - Watch for pancreatitis, bleeding, perforation, and                         cholangitis.                        - Repeat ERCP in 3 months to exchange  stent. Procedure Code(s):     --- Professional ---                        [phone], Endoscopic retrograde cholangiopancreatography                         (ERCP); with removal and exchange of stent(s), biliary                         or pancreatic duct, including pre- and post-dilation                         and guide wire passage, when performed, including                         sphincterotomy, when performed, each stent exchanged                        43261, 59, Endoscopic retrograde                         cholangiopancreatography (ERCP); with biopsy, single                         or multiple                        74328, Endoscopic catheterization of the biliary                         ductal system, radiological supervision and                         interpretation Diagnosis Code(s):     --- Professional ---                        K83.1, Obstruction of bile duct                        Z46.59, Encounter for fitting and adjustment of other                         gastrointestinal appliance and device CPT copyright 2022 American Medical Association. All rights reserved. The codes documented in this report are preliminary and upon coder review may  be revised to meet current compliance requirements. Rogelia Copping MD, MD 04/17/2024 11:33:49 AM This report has been signed electronically. Number of Addenda: 0 Note Initiated On: 04/17/2024 10:54 AM Estimated Blood Loss:  Estimated blood loss: none.      [facility]

## 2024-04-17 NOTE — Telephone Encounter (Signed)
 ERCP 3 month stent removal

## 2024-04-17 NOTE — Transfer of Care (Signed)
 Immediate Anesthesia Transfer of Care Note  Patient: Casey Peters  Procedure(s) Performed: ERCP, WITH INTERVENTION IF INDICATED  Patient Location: Endoscopy Unit  Anesthesia Type:General  Level of Consciousness: drowsy  Airway & Oxygen Therapy: Patient Spontanous Breathing  Post-op Assessment: Report given to RN and Post -op Vital signs reviewed and stable  Post vital signs: Reviewed and stable  Last Vitals:  Vitals Value Taken Time  BP 108/73 04/17/24 11:35  Temp 36.2 C 04/17/24 11:35  Pulse 86 04/17/24 11:37  Resp 16 04/17/24 11:37  SpO2 100 % 04/17/24 11:37  Vitals shown include unfiled device data.  Last Pain:  Vitals:   04/17/24 1135  TempSrc: Tympanic  PainSc: Asleep         Complications: No notable events documented.

## 2024-04-17 NOTE — Anesthesia Preprocedure Evaluation (Signed)
 Anesthesia Evaluation  Patient identified by MRN, date of birth, ID band Patient awake    Reviewed: Allergy & Precautions, NPO status , Patient's Chart, lab work & pertinent test results  History of Anesthesia Complications Negative for: history of anesthetic complications  Airway Mallampati: III  TM Distance: <3 FB Neck ROM: full    Dental  (+) Chipped, Poor Dentition   Pulmonary COPD, Current Smoker   Pulmonary exam normal        Cardiovascular hypertension, (-) angina + Past MI and +CHF  Normal cardiovascular exam     Neuro/Psych  Neuromuscular disease  negative psych ROS   GI/Hepatic negative GI ROS, Neg liver ROS,neg GERD  ,,  Endo/Other  diabetes, Type 2    Renal/GU negative Renal ROS  negative genitourinary   Musculoskeletal   Abdominal   Peds  Hematology negative hematology ROS (+)   Anesthesia Other Findings Past Medical History: No date: Cardiomegaly No date: CHF (congestive heart failure) (HCC) No date: Constipation No date: Diabetic polyneuropathy (HCC) No date: Dilated idiopathic cardiomyopathy (HCC) No date: Erectile dysfunction No date: HLD (hyperlipidemia) No date: HTN (hypertension) No date: Low testosterone  No date: Palpitations No date: Pneumonia No date: T2DM (type 2 diabetes mellitus) (HCC)  Past Surgical History: 12/27/2021: CORONARY/GRAFT ACUTE MI REVASCULARIZATION; N/A     Comment:  Procedure: Coronary/Graft Acute MI Revascularization;                Surgeon: Mady Bruckner, MD;  Location: [facility] INVASIVE               CV LAB;  Service: Cardiovascular;  Laterality: N/A; 06/17/2021: CYSTOSCOPY WITH URETHRAL DILATATION; N/A     Comment:  Procedure: CYSTOSCOPY WITH URETHRAL DILATATION;                Surgeon: Twylla Glendia BROCKS, MD;  Location: [facility] ORS;                Service: Urology;  Laterality: N/A;  Optilume Balloon 03/15/2024: ERCP; N/A     Comment:  Procedure: ERCP, WITH  INTERVENTION IF INDICATED;                Surgeon: Jinny Carmine, MD;  Location: [facility] ENDOSCOPY;                Service: Endoscopy;  Laterality: N/A; 1998: HERNIA REPAIR 03/16/2024: IR BILIARY DRAIN PLACEMENT WITH CHOLANGIOGRAM 12/27/2021: LEFT HEART CATH AND CORONARY ANGIOGRAPHY; N/A     Comment:  Procedure: LEFT HEART CATH AND CORONARY ANGIOGRAPHY;                Surgeon: Mady Bruckner, MD;  Location: [facility] INVASIVE               CV LAB;  Service: Cardiovascular;  Laterality: N/A; No date: PILONIDAL CYST EXCISION No date: TONSILLECTOMY 1995: URETHRA SURGERY  BMI    Body Mass Index: 26.08 kg/m      Reproductive/Obstetrics negative OB ROS                              Anesthesia Physical Anesthesia Plan  ASA: 3  Anesthesia Plan: General   Post-op Pain Management:    Induction: Intravenous  PONV Risk Score and Plan: Propofol  infusion and TIVA  Airway Management Planned: Natural Airway and Nasal Cannula  Additional Equipment:   Intra-op Plan:   Post-operative Plan:   Informed Consent: I have reviewed the patients History and Physical, chart,  labs and discussed the procedure including the risks, benefits and alternatives for the proposed anesthesia with the patient or authorized representative who has indicated his/her understanding and acceptance.     Dental Advisory Given  Plan Discussed with: Anesthesiologist, CRNA and Surgeon  Anesthesia Plan Comments: (Patient consented for risks of anesthesia including but not limited to:  - adverse reactions to medications - risk of airway placement if required - damage to eyes, teeth, lips or other oral mucosa - nerve damage due to positioning  - sore throat or hoarseness - Damage to heart, brain, nerves, lungs, other parts of body or loss of life  Patient voiced understanding and assent.)        Anesthesia Quick Evaluation

## 2024-04-17 NOTE — Anesthesia Postprocedure Evaluation (Signed)
 Anesthesia Post Note  Patient: Casey Peters  Procedure(s) Performed: ERCP, WITH INTERVENTION IF INDICATED  Patient location during evaluation: Endoscopy Anesthesia Type: General Level of consciousness: awake and alert Pain management: pain level controlled Vital Signs Assessment: post-procedure vital signs reviewed and stable Respiratory status: spontaneous breathing, nonlabored ventilation and respiratory function stable Cardiovascular status: blood pressure returned to baseline and stable Postop Assessment: no apparent nausea or vomiting Anesthetic complications: no   No notable events documented.   Last Vitals:  Vitals:   04/17/24 1155 04/17/24 1200  BP: (P) 122/73 135/72  Pulse:  81  Resp:  15  Temp:    SpO2: (P) 100% 99%    Last Pain:  Vitals:   04/17/24 1200  TempSrc:   PainSc: 0-No pain                 Fairy POUR Hunter Pinkard

## 2024-04-17 NOTE — Telephone Encounter (Signed)
 Verbal order from Dr Jinny  Give Tylenol  and watch and see if temp decreases, if not along with the following Sx, Pt should go to ED  start to experience severe abdominal pain, chills, difficulty swallowing or worsening throat pain, or vomiting  Spouse expressed understanding

## 2024-04-17 NOTE — Telephone Encounter (Signed)
 spouse called and stated pt had temp of 100.3 but does not have any other Sx or pain.... Please advise

## 2024-04-17 NOTE — H&P (Signed)
 Rogelia Copping, MD [facility] [address] [place], [place] [address] Phone:[phone] Fax : [phone]  Primary Care Physician:  Rudolpho Norleen BIRCH, MD Primary Gastroenterologist:  Dr. Copping  Pre-Procedure History & Physical: HPI:  Casey Peters is a 78 y.o. male is here for an ERCP.   Past Medical History:  Diagnosis Date   Cardiomegaly    CHF (congestive heart failure) (HCC)    Constipation    Diabetic polyneuropathy (HCC)    Dilated idiopathic cardiomyopathy (HCC)    Erectile dysfunction    HLD (hyperlipidemia)    HTN (hypertension)    Low testosterone     Palpitations    Pneumonia    T2DM (type 2 diabetes mellitus) (HCC)     Past Surgical History:  Procedure Laterality Date   CORONARY/GRAFT ACUTE MI REVASCULARIZATION N/A 12/27/2021   Procedure: Coronary/Graft Acute MI Revascularization;  Surgeon: Mady Bruckner, MD;  Location: [facility] INVASIVE CV LAB;  Service: Cardiovascular;  Laterality: N/A;   CYSTOSCOPY WITH URETHRAL DILATATION N/A 06/17/2021   Procedure: CYSTOSCOPY WITH URETHRAL DILATATION;  Surgeon: Twylla Glendia BROCKS, MD;  Location: [facility] ORS;  Service: Urology;  Laterality: N/A;  Optilume Balloon   ERCP N/A 03/15/2024   Procedure: ERCP, WITH INTERVENTION IF INDICATED;  Surgeon: Copping Rogelia, MD;  Location: [facility] ENDOSCOPY;  Service: Endoscopy;  Laterality: N/A;   HERNIA REPAIR  1998   IR BILIARY DRAIN PLACEMENT WITH CHOLANGIOGRAM  03/16/2024   LEFT HEART CATH AND CORONARY ANGIOGRAPHY N/A 12/27/2021   Procedure: LEFT HEART CATH AND CORONARY ANGIOGRAPHY;  Surgeon: Mady Bruckner, MD;  Location: [facility] INVASIVE CV LAB;  Service: Cardiovascular;  Laterality: N/A;   PILONIDAL CYST EXCISION     TONSILLECTOMY     URETHRA SURGERY  1995    Prior to Admission medications   Medication Sig Start Date End Date Taking? Authorizing Provider  carvedilol  (COREG ) 6.25 MG tablet Take 6.25 mg by mouth 2 (two) times daily with a meal.   Yes [provider]  glimepiride  (AMARYL) 2 MG tablet Take 2 mg by mouth. 03/21/24 03/21/25 Yes [provider]  spironolactone  (ALDACTONE ) 25 MG tablet Take 12.5 mg by mouth daily. 01/04/24  Yes [provider]  ascorbic acid (VITAMIN C) 500 MG tablet Take 500 mg by mouth daily. Patient not taking: Reported on 03/28/2024    [provider]  aspirin  EC 81 MG tablet Take 81 mg by mouth daily.    [provider]  cholecalciferol (VITAMIN D3) 25 MCG (1000 UNIT) tablet Take 1,000 Units by mouth daily. Patient not taking: Reported on 03/28/2024    [provider]  citalopram (CELEXA) 20 MG tablet Take 20 mg by mouth daily. Patient not taking: Reported on 03/28/2024 02/03/24   [provider]  furosemide  (LASIX ) 40 MG tablet Take 40 mg by mouth daily.    [provider]  glimepiride (AMARYL) 1 MG tablet Take 1 mg by mouth daily with breakfast. Patient not taking: Reported on 03/28/2024 03/03/24 03/03/25  [provider]  JANUVIA 50 MG tablet Take 1 tablet by mouth daily. 02/29/24 02/28/25  [provider]  metFORMIN  (GLUCOPHAGE ) 1000 MG tablet Take 1,000 mg by mouth 2 (two) times daily. Patient not taking: Reported on 03/28/2024 11/30/23   [provider]  naloxone  (NARCAN ) nasal spray 4 mg/0.1 mL SPRAY 1 SPRAY INTO ONE NOSTRIL AS DIRECTED FOR OPIOID OVERDOSE (TURN PERSON ON SIDE AFTER DOSE. IF NO RESPONSE IN 2-3 MINUTES OR PERSON RESPONDS BUT RELAPSES, REPEAT USING A NEW  SPRAY DEVICE AND SPRAY INTO THE OTHER NOSTRIL. CALL 911 AFTER USE.) * EMERGENCY USE ONLY * 04/05/24   Borders, Fonda SAUNDERS, NP  nitrofurantoin , macrocrystal-monohydrate, (MACROBID ) 100 MG capsule TAKE 1 CAPSULE BY MOUTH ONCE DAILY 11/18/23   Vaillancourt, Samantha, PA-C  omeprazole  (PRILOSEC) 20 MG capsule Take 1 capsule (20 mg total) by mouth daily. 04/05/24   Borders, Fonda SAUNDERS, NP  ondansetron  (ZOFRAN ) 8 MG tablet One pill every 8 hours as needed for nausea/vomitting. 03/28/24   Brahmanday, Govinda  R, MD  oxyCODONE  (OXY IR/ROXICODONE ) 5 MG immediate release tablet Take 1 tablet (5 mg total) by mouth every 8 (eight) hours as needed for severe pain (pain score 7-10). 04/05/24   Borders, Fonda SAUNDERS, NP  senna (SENOKOT) 8.6 MG TABS tablet Take 1 tablet (8.6 mg total) by mouth daily as needed for mild constipation. 04/05/24   Borders, Fonda SAUNDERS, NP  silodosin  (RAPAFLO ) 8 MG CAPS capsule Take 1 capsule (8 mg total) by mouth daily with breakfast. 12/10/23   Vaillancourt, Lucie, PA-C  testosterone  (ANDROGEL ) 50 MG/5GM (1%) GEL Place 5 g onto the skin daily. Please confirm with your cardiologist before resuming 12/29/21   Amin, Sumayya, MD  Zinc Gluconate 30 MG TABS Take 30 mg by mouth daily. Patient not taking: Reported on 03/28/2024    [provider]    Allergies as of 03/22/2024 - Review Complete 03/16/2024  Allergen Reaction Noted   Citalopram Nausea And Vomiting 03/14/2024   Sulfamethoxazole -trimethoprim   06/05/2021   Trimethoprim  Other (See Comments) 05/05/2022    Family History  Problem Relation Age of Onset   Hematuria Father    Kidney disease Neg Hx    Prostate cancer Neg Hx     Social History   Socioeconomic History   Marital status: Married    Spouse name: Not on file   Number of children: Not on file   Years of education: Not on file   Highest education level: Not on file  Occupational History   Not on file  Tobacco Use   Smoking status: Every Day    Types: Cigars   Smokeless tobacco: Never   Tobacco comments:    2-3 cigars per day  Vaping Use   Vaping status: Never Used  Substance and Sexual Activity   Alcohol use: No    Alcohol/week: 0.0 standard drinks of alcohol   Drug use: No   Sexual activity: Yes    Birth control/protection: None  Other Topics Concern   Not on file  Social History Narrative   Not on file   Social Drivers of Health   Financial Resource Strain: Not on file  Food Insecurity: No Food Insecurity (03/28/2024)   Hunger Vital Sign     Worried About Running Out of Food in the Last Year: Never true    Ran Out of Food in the Last Year: Never true  Transportation Needs: No Transportation Needs (03/28/2024)   PRAPARE - Administrator, Civil Service (Medical): No    Lack of Transportation (Non-Medical): No  Physical Activity: Not on file  Stress: Not on file  Social Connections: Socially Integrated (03/14/2024)   Social Connection and Isolation Panel    Frequency of Communication with Friends and Family: More than three times a week    Frequency of Social Gatherings with Friends and Family: Twice a week    Attends Religious Services: More than 4 times per year    Active Member of [employer] or Organizations: Yes  Attends Banker Meetings: More than 4 times per year    Marital Status: Married  Catering manager Violence: Not At Risk (03/28/2024)   Humiliation, Afraid, Rape, and Kick questionnaire    Fear of Current or Ex-Partner: No    Emotionally Abused: No    Physically Abused: No    Sexually Abused: No    Review of Systems: See HPI, otherwise negative ROS  Physical Exam: BP 130/74   Pulse 79   Temp (!) 97 F (36.1 C) (Temporal)   Resp 17   Wt 84.8 kg   SpO2 100%   BMI 26.08 kg/m  General:   Alert,  pleasant and cooperative in NAD Head:  Normocephalic and atraumatic. Neck:  Supple; no masses or thyromegaly. Lungs:  Clear throughout to auscultation.    Heart:  Regular rate and rhythm. Abdomen:  Soft, nontender and nondistended. Normal bowel sounds, without guarding, and without rebound.   Neurologic:  Alert and  oriented x4;  grossly normal neurologically.  Impression/Plan: Casey Peters is here for an ERCP to be performed for bilary stricture  Risks, benefits, limitations, and alternatives regarding  ERCP have been reviewed with the patient.  Questions have been answered.  All parties agreeable.   Rogelia Copping, MD  04/17/2024, 10:23 AM

## 2024-04-17 NOTE — Brief Op Note (Signed)
 Right hepatic duct brushing and Left hepatic duct brushing sent to cytology

## 2024-04-17 NOTE — Telephone Encounter (Signed)
Pt's spouse is aware and expressed understanding

## 2024-04-18 ENCOUNTER — Observation Stay
Admit: 2024-04-18 | Discharge: 2024-04-18 | Disposition: A | Discharge status: Home / Self Care | Attending: Student | Admitting: Student

## 2024-04-18 ENCOUNTER — Inpatient Hospital Stay

## 2024-04-18 ENCOUNTER — Emergency Department

## 2024-04-18 DIAGNOSIS — R6520 Severe sepsis without septic shock: Secondary | ICD-10-CM | POA: Diagnosis not present

## 2024-04-18 DIAGNOSIS — E872 Acidosis, unspecified: Secondary | ICD-10-CM | POA: Diagnosis present

## 2024-04-18 DIAGNOSIS — J189 Pneumonia, unspecified organism: Secondary | ICD-10-CM

## 2024-04-18 DIAGNOSIS — A419 Sepsis, unspecified organism: Secondary | ICD-10-CM | POA: Diagnosis present

## 2024-04-18 DIAGNOSIS — K769 Liver disease, unspecified: Secondary | ICD-10-CM | POA: Diagnosis present

## 2024-04-18 DIAGNOSIS — B9620 Unspecified Escherichia coli [E. coli] as the cause of diseases classified elsewhere: Secondary | ICD-10-CM

## 2024-04-18 DIAGNOSIS — K831 Obstruction of bile duct: Secondary | ICD-10-CM | POA: Diagnosis present

## 2024-04-18 DIAGNOSIS — E876 Hypokalemia: Secondary | ICD-10-CM | POA: Diagnosis present

## 2024-04-18 DIAGNOSIS — R55 Syncope and collapse: Secondary | ICD-10-CM | POA: Diagnosis not present

## 2024-04-18 DIAGNOSIS — Z951 Presence of aortocoronary bypass graft: Secondary | ICD-10-CM | POA: Diagnosis not present

## 2024-04-18 DIAGNOSIS — N179 Acute kidney failure, unspecified: Secondary | ICD-10-CM

## 2024-04-18 DIAGNOSIS — R7881 Bacteremia: Secondary | ICD-10-CM | POA: Diagnosis not present

## 2024-04-18 DIAGNOSIS — R6521 Severe sepsis with septic shock: Secondary | ICD-10-CM

## 2024-04-18 DIAGNOSIS — J9601 Acute respiratory failure with hypoxia: Secondary | ICD-10-CM

## 2024-04-18 DIAGNOSIS — J440 Chronic obstructive pulmonary disease with acute lower respiratory infection: Secondary | ICD-10-CM | POA: Diagnosis present

## 2024-04-18 DIAGNOSIS — I110 Hypertensive heart disease with heart failure: Secondary | ICD-10-CM | POA: Diagnosis present

## 2024-04-18 DIAGNOSIS — N400 Enlarged prostate without lower urinary tract symptoms: Secondary | ICD-10-CM | POA: Diagnosis present

## 2024-04-18 DIAGNOSIS — Z66 Do not resuscitate: Secondary | ICD-10-CM | POA: Diagnosis present

## 2024-04-18 DIAGNOSIS — K219 Gastro-esophageal reflux disease without esophagitis: Secondary | ICD-10-CM | POA: Diagnosis present

## 2024-04-18 DIAGNOSIS — Y95 Nosocomial condition: Secondary | ICD-10-CM | POA: Diagnosis present

## 2024-04-18 DIAGNOSIS — A4151 Sepsis due to Escherichia coli [E. coli]: Secondary | ICD-10-CM | POA: Diagnosis present

## 2024-04-18 DIAGNOSIS — E1142 Type 2 diabetes mellitus with diabetic polyneuropathy: Secondary | ICD-10-CM | POA: Diagnosis present

## 2024-04-18 DIAGNOSIS — E785 Hyperlipidemia, unspecified: Secondary | ICD-10-CM | POA: Diagnosis present

## 2024-04-18 DIAGNOSIS — R42 Dizziness and giddiness: Secondary | ICD-10-CM | POA: Diagnosis not present

## 2024-04-18 DIAGNOSIS — Z7984 Long term (current) use of oral hypoglycemic drugs: Secondary | ICD-10-CM | POA: Diagnosis not present

## 2024-04-18 DIAGNOSIS — I420 Dilated cardiomyopathy: Secondary | ICD-10-CM | POA: Diagnosis present

## 2024-04-18 DIAGNOSIS — I5022 Chronic systolic (congestive) heart failure: Secondary | ICD-10-CM | POA: Diagnosis present

## 2024-04-18 DIAGNOSIS — Z7982 Long term (current) use of aspirin: Secondary | ICD-10-CM | POA: Diagnosis not present

## 2024-04-18 DIAGNOSIS — I2510 Atherosclerotic heart disease of native coronary artery without angina pectoris: Secondary | ICD-10-CM | POA: Diagnosis present

## 2024-04-18 LAB — BLOOD CULTURE ID PANEL (REFLEXED) - BCID2

## 2024-04-18 LAB — BASIC METABOLIC PANEL WITH GFR
Anion gap: 8 (ref 5–15)
Anion gap: 9 (ref 5–15)
BUN: 33 mg/dL — ABNORMAL HIGH (ref 8–23)
BUN: 25 mg/dL — ABNORMAL HIGH (ref 8–23)
CO2: 20 mmol/L — ABNORMAL LOW (ref 22–32)
CO2: 16 mmol/L — ABNORMAL LOW (ref 22–32)
Calcium: 7.8 mg/dL — ABNORMAL LOW (ref 8.9–10.3)
Calcium: 7.8 mg/dL — ABNORMAL LOW (ref 8.9–10.3)
Chloride: 112 mmol/L — ABNORMAL HIGH (ref 98–111)
Chloride: 114 mmol/L — ABNORMAL HIGH (ref 98–111)
Creatinine, Ser: 1.68 mg/dL — ABNORMAL HIGH (ref 0.61–1.24)
Creatinine, Ser: 1.22 mg/dL (ref 0.61–1.24)
GFR, Estimated: 42 mL/min — ABNORMAL LOW (ref >60)
GFR, Estimated: >60 mL/min (ref >60)
Glucose, Bld: 129 mg/dL — ABNORMAL HIGH (ref 70–99)
Glucose, Bld: 196 mg/dL — ABNORMAL HIGH (ref 70–99)
Potassium: 2.8 mmol/L — ABNORMAL LOW (ref 3.5–5.1)
Potassium: 4.2 mmol/L (ref 3.5–5.1)
Sodium: 140 mmol/L (ref 135–145)
Sodium: 139 mmol/L (ref 135–145)

## 2024-04-18 LAB — URINALYSIS, COMPLETE (UACMP) WITH MICROSCOPIC
Bilirubin Urine: NEGATIVE
Glucose, UA: NEGATIVE mg/dL
Hgb urine dipstick: NEGATIVE
Ketones, ur: NEGATIVE mg/dL
Nitrite: NEGATIVE
Protein, ur: NEGATIVE mg/dL
Specific Gravity, Urine: 1.031 — ABNORMAL HIGH (ref 1.005–1.030)
pH: 5 (ref 5.0–8.0)

## 2024-04-18 LAB — ECHOCARDIOGRAM COMPLETE
AR max vel: 3.64 cm2
AV Area VTI: 4.02 cm2
AV Area mean vel: 3.53 cm2
AV Mean grad: 2 mmHg
AV Peak grad: 3.4 mmHg
Ao pk vel: 0.92 m/s
Area-P 1/2: 5.54 cm2
Calc EF: 44.2 %
Height: 71 in
MV VTI: 2.63 cm2
S' Lateral: 3.13 cm
Single Plane A2C EF: 54.4 %
Single Plane A4C EF: 24.3 %
Weight: 2992.01 [oz_av]

## 2024-04-18 LAB — CBG MONITORING, ED
Glucose-Capillary: 125 mg/dL — ABNORMAL HIGH (ref 70–99)
Glucose-Capillary: 139 mg/dL — ABNORMAL HIGH (ref 70–99)
Glucose-Capillary: 141 mg/dL — ABNORMAL HIGH (ref 70–99)

## 2024-04-18 LAB — HEPATIC FUNCTION PANEL
ALT: 59 U/L — ABNORMAL HIGH (ref 0–44)
AST: 27 U/L (ref 15–41)
Albumin: 2.2 g/dL — ABNORMAL LOW (ref 3.5–5.0)
Alkaline Phosphatase: 84 U/L (ref 38–126)
Bilirubin, Direct: 1.3 mg/dL — ABNORMAL HIGH (ref 0.0–0.2)
Indirect Bilirubin: 1 mg/dL — ABNORMAL HIGH (ref 0.3–0.9)
Total Bilirubin: 2.3 mg/dL — ABNORMAL HIGH (ref 0.0–1.2)
Total Protein: 5.3 g/dL — ABNORMAL LOW (ref 6.5–8.1)

## 2024-04-18 LAB — CBC
HCT: 32.4 % — ABNORMAL LOW (ref 39.0–52.0)
Hemoglobin: 10.9 g/dL — ABNORMAL LOW (ref 13.0–17.0)
MCH: 32 pg (ref 26.0–34.0)
MCHC: 33.6 g/dL (ref 30.0–36.0)
MCV: 95 fL (ref 80.0–100.0)
Platelets: 157 K/uL (ref 150–400)
RBC: 3.41 MIL/uL — ABNORMAL LOW (ref 4.22–5.81)
RDW: 16 % — ABNORMAL HIGH (ref 11.5–15.5)
WBC: 17.1 K/uL — ABNORMAL HIGH (ref 4.0–10.5)
nRBC: 0 % (ref 0.0–0.2)

## 2024-04-18 LAB — VITAMIN D 25 HYDROXY (VIT D DEFICIENCY, FRACTURES): Vit D, 25-Hydroxy: 12.89 ng/mL — ABNORMAL LOW (ref 30–100)

## 2024-04-18 LAB — SODIUM, URINE, RANDOM: Sodium, Ur: <10 mmol/L

## 2024-04-18 LAB — GLUCOSE, CAPILLARY
Glucose-Capillary: 109 mg/dL — ABNORMAL HIGH (ref 70–99)
Glucose-Capillary: 165 mg/dL — ABNORMAL HIGH (ref 70–99)
Glucose-Capillary: 168 mg/dL — ABNORMAL HIGH (ref 70–99)

## 2024-04-18 LAB — TROPONIN I (HIGH SENSITIVITY)
Troponin I (High Sensitivity): 89 ng/L — ABNORMAL HIGH (ref <18)
Troponin I (High Sensitivity): 58 ng/L — ABNORMAL HIGH (ref <18)
Troponin I (High Sensitivity): 67 ng/L — ABNORMAL HIGH (ref <18)

## 2024-04-18 LAB — CREATININE, SERUM
Creatinine, Ser: 1.44 mg/dL — ABNORMAL HIGH (ref 0.61–1.24)
GFR, Estimated: 50 mL/min — ABNORMAL LOW (ref >60)

## 2024-04-18 LAB — FOLATE: Folate: 12 ng/mL (ref >5.9)

## 2024-04-18 LAB — CYTOLOGY - NON PAP

## 2024-04-18 LAB — MAGNESIUM
Magnesium: 1.4 mg/dL — ABNORMAL LOW (ref 1.7–2.4)
Magnesium: 1.9 mg/dL (ref 1.7–2.4)

## 2024-04-18 LAB — PHOSPHORUS
Phosphorus: 2.2 mg/dL — ABNORMAL LOW (ref 2.5–4.6)
Phosphorus: 3.8 mg/dL (ref 2.5–4.6)

## 2024-04-18 LAB — LACTIC ACID, PLASMA
Lactic Acid, Venous: 1.3 mmol/L (ref 0.5–1.9)
Lactic Acid, Venous: 2.3 mmol/L (ref 0.5–1.9)
Lactic Acid, Venous: 1.8 mmol/L (ref 0.5–1.9)

## 2024-04-18 LAB — CREATININE, URINE, RANDOM: Creatinine, Urine: 90 mg/dL

## 2024-04-18 LAB — BRAIN NATRIURETIC PEPTIDE: B Natriuretic Peptide: 65.6 pg/mL (ref 0.0–100.0)

## 2024-04-18 LAB — MRSA NEXT GEN BY PCR, NASAL: MRSA by PCR Next Gen: NOT DETECTED

## 2024-04-18 MED ORDER — METRONIDAZOLE 500 MG/100ML IV SOLN
500.0000 mg | Freq: Two times a day (BID) | INTRAVENOUS | Status: DC
  Administered 2024-04-18 – 2024-04-20 (×5): 500 mg via INTRAVENOUS
  Filled 2024-04-18 (×6): qty 100

## 2024-04-18 MED ORDER — GUAIFENESIN ER 600 MG PO TB12
600.0000 mg | ORAL_TABLET | Freq: Two times a day (BID) | ORAL | Status: DC
  Administered 2024-04-18 – 2024-04-24 (×13): 600 mg via ORAL
  Filled 2024-04-18 (×13): qty 1

## 2024-04-18 MED ORDER — MIDODRINE HCL 5 MG PO TABS
10.0000 mg | ORAL_TABLET | Freq: Three times a day (TID) | ORAL | Status: DC
  Administered 2024-04-18: 10 mg via ORAL
  Filled 2024-04-18: qty 2

## 2024-04-18 MED ORDER — PERFLUTREN LIPID MICROSPHERE
1.0000 mL | INTRAVENOUS | Status: AC | PRN
  Administered 2024-04-18: 2 mL via INTRAVENOUS

## 2024-04-18 MED ORDER — IBUPROFEN 600 MG PO TABS
600.0000 mg | ORAL_TABLET | Freq: Once | ORAL | Status: AC
  Administered 2024-04-18: 600 mg via ORAL
  Filled 2024-04-18: qty 1

## 2024-04-18 MED ORDER — ACETAMINOPHEN 325 MG PO TABS
650.0000 mg | ORAL_TABLET | Freq: Four times a day (QID) | ORAL | Status: DC | PRN
  Administered 2024-04-19 – 2024-04-24 (×10): 650 mg via ORAL
  Filled 2024-04-18 (×10): qty 2

## 2024-04-18 MED ORDER — VANCOMYCIN HCL 1250 MG/250ML IV SOLN: 1250.0000 mg | INTRAVENOUS | Status: DC

## 2024-04-18 MED ORDER — SODIUM CHLORIDE 0.9 % IV BOLUS
1000.0000 mL | Freq: Once | INTRAVENOUS | Status: AC
  Administered 2024-04-18: 1000 mL via INTRAVENOUS

## 2024-04-18 MED ORDER — INSULIN ASPART 100 UNIT/ML IJ SOLN
0.0000 [IU] | INTRAMUSCULAR | Status: DC
  Administered 2024-04-18: 2 [IU] via SUBCUTANEOUS
  Administered 2024-04-19: 1 [IU] via SUBCUTANEOUS
  Administered 2024-04-19: 2 [IU] via SUBCUTANEOUS
  Administered 2024-04-20: 1 [IU] via SUBCUTANEOUS
  Filled 2024-04-18 (×4): qty 1

## 2024-04-18 MED ORDER — SODIUM CHLORIDE 0.9 % IV SOLN
250.0000 mL | INTRAVENOUS | Status: AC
  Administered 2024-04-18: 250 mL via INTRAVENOUS

## 2024-04-18 MED ORDER — PIPERACILLIN-TAZOBACTAM 3.375 G IVPB 30 MIN
3.3750 g | Freq: Once | INTRAVENOUS | Status: AC
  Administered 2024-04-18: 3.375 g via INTRAVENOUS
  Filled 2024-04-18 (×2): qty 50

## 2024-04-18 MED ORDER — ACETAMINOPHEN 650 MG RE SUPP: 650.0000 mg | Freq: Four times a day (QID) | RECTAL | Status: DC | PRN

## 2024-04-18 MED ORDER — VANCOMYCIN HCL 2000 MG/400ML IV SOLN
2000.0000 mg | Freq: Once | INTRAVENOUS | Status: AC
  Administered 2024-04-18: 2000 mg via INTRAVENOUS
  Filled 2024-04-18: qty 400

## 2024-04-18 MED ORDER — LACTATED RINGERS IV SOLN
150.0000 mL/h | INTRAVENOUS | Status: DC
Start: 2024-04-18 — End: 2024-04-18
  Administered 2024-04-18: 150 mL/h via INTRAVENOUS

## 2024-04-18 MED ORDER — ALBUTEROL SULFATE (2.5 MG/3ML) 0.083% IN NEBU: 2.5000 mg | INHALATION_SOLUTION | RESPIRATORY_TRACT | Status: DC | PRN

## 2024-04-18 MED ORDER — HEPARIN SODIUM (PORCINE) 5000 UNIT/ML IJ SOLN
5000.0000 [IU] | Freq: Three times a day (TID) | INTRAMUSCULAR | Status: DC
  Administered 2024-04-18 – 2024-04-21 (×8): 5000 [IU] via SUBCUTANEOUS
  Filled 2024-04-18 (×9): qty 1

## 2024-04-18 MED ORDER — ONDANSETRON HCL 4 MG PO TABS
4.0000 mg | ORAL_TABLET | Freq: Four times a day (QID) | ORAL | Status: DC | PRN
Start: 2024-04-18 — End: 2024-04-24

## 2024-04-18 MED ORDER — SODIUM CHLORIDE 0.9 % IV SOLN
2.0000 g | Freq: Two times a day (BID) | INTRAVENOUS | Status: DC
  Administered 2024-04-18: 2 g via INTRAVENOUS
  Filled 2024-04-18: qty 12.5

## 2024-04-18 MED ORDER — CHLORHEXIDINE GLUCONATE CLOTH 2 % EX PADS
6.0000 | MEDICATED_PAD | Freq: Every day | CUTANEOUS | Status: DC
  Administered 2024-04-19 – 2024-04-24 (×4): 6 via TOPICAL

## 2024-04-18 MED ORDER — SODIUM CHLORIDE 0.9 % IV SOLN
2.0000 g | INTRAVENOUS | Status: DC
  Administered 2024-04-18 – 2024-04-19 (×2): 2 g via INTRAVENOUS
  Filled 2024-04-18 (×3): qty 20

## 2024-04-18 MED ORDER — ACETAMINOPHEN 500 MG PO TABS
1000.0000 mg | ORAL_TABLET | Freq: Once | ORAL | Status: AC
  Administered 2024-04-18: 1000 mg via ORAL
  Filled 2024-04-18: qty 2

## 2024-04-18 MED ORDER — TAMSULOSIN HCL 0.4 MG PO CAPS
0.4000 mg | ORAL_CAPSULE | Freq: Every day | ORAL | Status: DC
  Administered 2024-04-18: 0.4 mg via ORAL
  Filled 2024-04-18 (×2): qty 1

## 2024-04-18 MED ORDER — IOHEXOL 350 MG/ML SOLN
100.0000 mL | Freq: Once | INTRAVENOUS | Status: AC | PRN
  Administered 2024-04-18: 100 mL via INTRAVENOUS

## 2024-04-18 MED ORDER — SODIUM CHLORIDE 0.9 % IV BOLUS
1000.0000 mL | Freq: Once | INTRAVENOUS | Status: AC
Start: 2024-04-18 — End: 2024-04-18
  Administered 2024-04-18: 1000 mL via INTRAVENOUS

## 2024-04-18 MED ORDER — ONDANSETRON HCL 4 MG/2ML IJ SOLN: 4.0000 mg | Freq: Four times a day (QID) | INTRAMUSCULAR | Status: DC | PRN

## 2024-04-18 MED ORDER — NOREPINEPHRINE 4 MG/250ML-% IV SOLN
0.0000 ug/min | INTRAVENOUS | Status: DC
  Administered 2024-04-18: 5 ug/min via INTRAVENOUS
  Filled 2024-04-18: qty 250

## 2024-04-18 MED ORDER — LACTATED RINGERS IV SOLN: INTRAVENOUS | Status: AC

## 2024-04-18 MED ORDER — MAGNESIUM SULFATE 2 GM/50ML IV SOLN
2.0000 g | Freq: Once | INTRAVENOUS | Status: AC
  Administered 2024-04-18: 2 g via INTRAVENOUS
  Filled 2024-04-18: qty 50

## 2024-04-18 MED ORDER — NOREPINEPHRINE 4 MG/250ML-% IV SOLN: 0.0000 ug/min | INTRAVENOUS | Status: DC

## 2024-04-18 MED ORDER — ENOXAPARIN SODIUM 40 MG/0.4ML IJ SOSY
40.0000 mg | PREFILLED_SYRINGE | INTRAMUSCULAR | Status: DC
  Administered 2024-04-18: 40 mg via SUBCUTANEOUS
  Filled 2024-04-18: qty 0.4

## 2024-04-18 MED ORDER — POTASSIUM CHLORIDE 10 MEQ/100ML IV SOLN
10.0000 meq | INTRAVENOUS | Status: AC
  Administered 2024-04-18 (×4): 10 meq via INTRAVENOUS
  Filled 2024-04-18 (×3): qty 100

## 2024-04-18 MED ORDER — POTASSIUM PHOSPHATES 15 MMOLE/5ML IV SOLN
30.0000 mmol | Freq: Once | INTRAVENOUS | Status: AC
  Administered 2024-04-18: 30 mmol via INTRAVENOUS
  Filled 2024-04-18: qty 10

## 2024-04-18 MED ORDER — IOHEXOL 300 MG/ML  SOLN: 100.0000 mL | Freq: Once | INTRAMUSCULAR | Status: DC | PRN

## 2024-04-18 MED ORDER — LACTATED RINGERS IV BOLUS
250.0000 mL | Freq: Once | INTRAVENOUS | Status: AC
  Administered 2024-04-18: 250 mL via INTRAVENOUS

## 2024-04-18 NOTE — Progress Notes (Signed)
 Patient was complaining of chest pain 5/10. Performed EKG. Notified NP Bridgett Nelwyn about patient's current condition. Orders placed for troponin.

## 2024-04-18 NOTE — Consult Note (Signed)
 NAME: Casey Peters  DOB: 12-Apr-1946  MRN: [id number]  Date/Time: 04/18/2024 1:35 PM  REQUESTING PROVIDER: Dr. Von Subjective:  REASON FOR CONSULT: E. coli bacteremia ? Casey Peters is a 78 y.o. 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  Cells were sent for cytology and the upper third of the main biliary duct and a plastic stent was placed.  The pathology of the brushings was showed showed atypical cells and malignancy could not be established.  He also had to undergo an internal/external biliary stent placement because of persistently high bilirubin. Patient  had a high CA 19.  He had seen our oncologist Patient on 04/17/2024 underwent ERCP and stent replacement with repeat brushings sent for cytology After the procedure he was feeling fine but later in the day he started feeling sick.  He had some fever and chills.  He was weak.  He came to the ED in the night..  Vitals in the ED was BP of 107/63, temperature 99.4, pulse 110, sats 97%.  Labs showed a WBC of 18.7, Hb 13.2, platelets 196 and creatinine of 1.46.  Blood cultures were sent.  CT abdomen revealed postprocedure changes in the liver with percutaneous biliary drain in place.  Scattered nodules in the lungs largest in the right middle lung measuring 7 mm..  Was placed on broad-spectrum antibiotics The blood culture came back positive for E. coli and I am asked to see the patient Patient is currently on ceftriaxone  and Flagyl .  Patient has a past history of urethral stricture and surgery to the urethra.  He also has a history of recurrent UTI and is on chronic suppressive therapy with nitrofurantoin   Past Medical History:   Diagnosis Date   Cardiomegaly    CHF (congestive heart failure) (HCC)    Constipation    Diabetic polyneuropathy (HCC)    Dilated idiopathic cardiomyopathy (HCC)    Erectile dysfunction    HLD (hyperlipidemia)    HTN (hypertension)    Low testosterone     Palpitations    Pneumonia    T2DM (type 2 diabetes mellitus) (HCC)     Past Surgical History:  Procedure Laterality Date   CORONARY/GRAFT ACUTE MI REVASCULARIZATION N/A 12/27/2021   Procedure: Coronary/Graft Acute MI Revascularization;  Surgeon: Mady Bruckner, MD;  Location: [facility];  Service: Cardiovascular;  Laterality: N/A;   CYSTOSCOPY WITH URETHRAL DILATATION N/A 06/17/2021   Procedure: CYSTOSCOPY WITH URETHRAL DILATATION;  Surgeon: Twylla Glendia BROCKS, MD;  Location: [facility];  Service: Urology;  Laterality: N/A;  Optilume Balloon   ERCP N/A 03/15/2024   Procedure: ERCP, WITH INTERVENTION IF INDICATED;  Surgeon: Jinny Carmine, MD;  Location: [facility];  Service: Endoscopy;  Laterality: N/A;   ERCP N/A 04/17/2024   Procedure: ERCP, WITH INTERVENTION IF INDICATED;  Surgeon: Jinny Carmine, MD;  Location: [facility];  Service: Endoscopy;  Laterality: N/A;   HERNIA REPAIR  1998   IR BILIARY DRAIN PLACEMENT WITH CHOLANGIOGRAM  03/16/2024   LEFT HEART CATH AND CORONARY ANGIOGRAPHY N/A 12/27/2021   Procedure: LEFT HEART CATH AND CORONARY ANGIOGRAPHY;  Surgeon: Mady Bruckner, MD;  Location: [facility];  Service: Cardiovascular;  Laterality: N/A;   PILONIDAL CYST EXCISION  TONSILLECTOMY     URETHRA SURGERY  1995    Social History   Socioeconomic History   Marital status: Married    Spouse name: Not on file   Number of children: Not on file   Years of education: Not on file   Highest education level: Not on file  Occupational History   Not on file  Tobacco Use   Smoking status: Every Day    Types: Cigars   Smokeless tobacco: Never   Tobacco comments:    2-3 cigars per day  Vaping Use   Vaping  status: Never Used  Substance and Sexual Activity   Alcohol use: No    Alcohol/week: 0.0 standard drinks of alcohol   Drug use: No   Sexual activity: Yes    Birth control/protection: None  Other Topics Concern   Not on file  Social History Narrative   Not on file   Social Drivers of Health   Financial Resource Strain: Not on file  Food Insecurity: No Food Insecurity (03/28/2024)   Hunger Vital Sign    Worried About Running Out of Food in the Last Year: Never true    Ran Out of Food in the Last Year: Never true  Transportation Needs: No Transportation Needs (03/28/2024)   PRAPARE - Administrator, Civil Service (Medical): No    Lack of Transportation (Non-Medical): No  Physical Activity: Not on file  Stress: Not on file  Social Connections: Socially Integrated (03/14/2024)   Social Connection and Isolation Panel    Frequency of Communication with Friends and Family: More than three times a week    Frequency of Social Gatherings with Friends and Family: Twice a week    Attends Religious Services: More than 4 times per year    Active Member of [employer] or Organizations: Yes    Attends Engineer, structural: More than 4 times per year    Marital Status: Married  Catering manager Violence: Not At Risk (03/28/2024)   Humiliation, Afraid, Rape, and Kick questionnaire    Fear of Current or Ex-Partner: No    Emotionally Abused: No    Physically Abused: No    Sexually Abused: No    Family History  Problem Relation Age of Onset   Hematuria Father    Kidney disease Neg Hx    Prostate cancer Neg Hx    Allergies  Allergen Reactions   Citalopram Nausea And Vomiting    Dizziness, unsteady, balanced issues   Sulfamethoxazole -Trimethoprim      Fatigue and muscle ache Other reaction(s): Other (See Comments) Fatigue and muscle ache   Trimethoprim  Other (See Comments)    Fatigue, hypotension   I? Current Facility-Administered Medications  Medication Dose Route  Frequency Provider Last Rate Last Admin   0.9 %  sodium chloride  infusion  250 mL Intravenous Continuous Chappell, Alex B, RPH 10 mL/hr at 04/18/24 1238 250 mL at 04/18/24 1238   acetaminophen  (TYLENOL ) tablet 650 mg  650 mg Oral Q6H PRN Duncan, Hazel V, MD       Or   acetaminophen  (TYLENOL ) suppository 650 mg  650 mg Rectal Q6H PRN Cleatus Delayne GAILS, MD       albuterol  (PROVENTIL ) (2.5 MG/3ML) 0.083% nebulizer solution 2.5 mg  2.5 mg Nebulization Q2H PRN Duncan, Hazel V, MD       cefTRIAXone  (ROCEPHIN ) 2 g in sodium chloride  0.9 % 100 mL IVPB  2 g Intravenous Q24H Kasa, Kurian, MD       guaiFENesin  (  MUCINEX ) 12 hr tablet 600 mg  600 mg Oral BID Von Bellis, MD   600 mg at 04/18/24 1104   heparin  injection 5,000 Units  5,000 Units Subcutaneous Q8H Von Bellis, MD       insulin  aspart (novoLOG ) injection 0-9 Units  0-9 Units Subcutaneous Q4H Cleatus Delayne GAILS, MD       lactated ringers  infusion   Intravenous Continuous Von Bellis, MD 100 mL/hr at 04/18/24 1100 New Bag at 04/18/24 1100   metroNIDAZOLE  (FLAGYL ) IVPB 500 mg  500 mg Intravenous Q12H Duncan, Hazel V, MD   Stopped at 04/18/24 9271   midodrine  (PROAMATINE ) tablet 10 mg  10 mg Oral TID WC Von Bellis, MD   10 mg at 04/18/24 1203   norepinephrine  (LEVOPHED ) 4mg  in (0.016 mg/mL) premix infusion  0-10 mcg/min Intravenous Titrated Clair Marolyn NOVAK, RPH 18.75 mL/hr at 04/18/24 1238 5 mcg/min at 04/18/24 1238   ondansetron  (ZOFRAN ) tablet 4 mg  4 mg Oral Q6H PRN Duncan, Hazel V, MD       Or   ondansetron  (ZOFRAN ) injection 4 mg  4 mg Intravenous Q6H PRN Cleatus Delayne GAILS, MD       potassium chloride  10 mEq in 100 mL IVPB  10 mEq Intravenous Q1 Hr x 4 Von Bellis, MD 100 mL/hr at 04/18/24 1327 10 mEq at 04/18/24 1327   potassium PHOSPHATE  30 mmol in dextrose  5 % 500 mL infusion  30 mmol Intravenous Once Von Bellis, MD 85 mL/hr at 04/18/24 1255 30 mmol at 04/18/24 1255   tamsulosin  (FLOMAX ) capsule 0.4 mg  0.4 mg Oral QPC supper  Von Bellis, MD       Current Outpatient Medications  Medication Sig Dispense Refill   ascorbic acid (VITAMIN C) 500 MG tablet Take 500 mg by mouth daily. (Patient not taking: Reported on 03/28/2024)     aspirin  EC 81 MG tablet Take 81 mg by mouth daily.     carvedilol  (COREG ) 6.25 MG tablet Take 6.25 mg by mouth 2 (two) times daily with a meal.     cholecalciferol (VITAMIN D3) 25 MCG (1000 UNIT) tablet Take 1,000 Units by mouth daily. (Patient not taking: Reported on 03/28/2024)     citalopram (CELEXA) 20 MG tablet Take 20 mg by mouth daily. (Patient not taking: Reported on 03/28/2024)     furosemide  (LASIX ) 40 MG tablet Take 40 mg by mouth daily.     glimepiride (AMARYL) 1 MG tablet Take 1 mg by mouth daily with breakfast. (Patient not taking: Reported on 03/28/2024)     glimepiride (AMARYL) 2 MG tablet Take 2 mg by mouth.     JANUVIA 50 MG tablet Take 1 tablet by mouth daily.     metFORMIN  (GLUCOPHAGE ) 1000 MG tablet Take 1,000 mg by mouth 2 (two) times daily. (Patient not taking: Reported on 03/28/2024)     naloxone  (NARCAN ) nasal spray 4 mg/0.1 mL SPRAY 1 SPRAY INTO ONE NOSTRIL AS DIRECTED FOR OPIOID OVERDOSE (TURN PERSON ON SIDE AFTER DOSE. IF NO RESPONSE IN 2-3 MINUTES OR PERSON RESPONDS BUT RELAPSES, REPEAT USING A NEW SPRAY DEVICE AND SPRAY INTO THE OTHER NOSTRIL. CALL 911 AFTER USE.) * EMERGENCY USE ONLY * 56 each 0   nitrofurantoin , macrocrystal-monohydrate, (MACROBID ) 100 MG capsule TAKE 1 CAPSULE BY MOUTH ONCE DAILY 90 capsule 3   omeprazole  (PRILOSEC) 20 MG capsule Take 1 capsule (20 mg total) by mouth daily. 30 capsule 3   ondansetron  (ZOFRAN ) 8 MG tablet One pill every 8 hours as  needed for nausea/vomitting. 60 tablet 1   oxyCODONE  (OXY IR/ROXICODONE ) 5 MG immediate release tablet Take 1 tablet (5 mg total) by mouth every 8 (eight) hours as needed for severe pain (pain score 7-10). 30 tablet 0   senna (SENOKOT) 8.6 MG TABS tablet Take 1 tablet (8.6 mg total) by mouth daily as needed  for mild constipation. 120 tablet 0   silodosin  (RAPAFLO ) 8 MG CAPS capsule Take 1 capsule (8 mg total) by mouth daily with breakfast. 90 capsule 3   spironolactone  (ALDACTONE ) 25 MG tablet Take 12.5 mg by mouth daily.     testosterone  (ANDROGEL ) 50 MG/5GM (1%) GEL Place 5 g onto the skin daily. Please confirm with your cardiologist before resuming     Zinc Gluconate 30 MG TABS Take 30 mg by mouth daily. (Patient not taking: Reported on 03/28/2024)     Facility-Administered Medications Ordered in Other Encounters  Medication Dose Route Frequency Provider Last Rate Last Admin   perflutren  lipid microspheres (DEFINITY ) IV suspension  1-10 mL Intravenous PRN Von Bellis, MD   2 mL at 04/18/24 1157     Abtx:  Anti-infectives (From admission, onward)    Start     Dose/Rate Route Frequency Ordered Stop   04/19/24 0000  vancomycin  (VANCOREADY) IVPB 1250 mg/250 mL  Status:  Discontinued        1,250 mg 166.7 mL/hr over 90 Minutes Intravenous Every 24 hours 04/18/24 0516 04/18/24 1322   04/18/24 1800  cefTRIAXone  (ROCEPHIN ) 2 g in sodium chloride  0.9 % 100 mL IVPB        2 g 200 mL/hr over 30 Minutes Intravenous Every 24 hours 04/18/24 1322     04/18/24 0800  ceFEPIme  (MAXIPIME ) 2 g in sodium chloride  0.9 % 100 mL IVPB  Status:  Discontinued        2 g 200 mL/hr over 30 Minutes Intravenous Every 12 hours 04/18/24 0514 04/18/24 1321   04/18/24 0600  metroNIDAZOLE  (FLAGYL ) IVPB 500 mg        500 mg 100 mL/hr over 60 Minutes Intravenous Every 12 hours 04/18/24 0444 04/25/24 0559   04/18/24 0045  piperacillin -tazobactam (ZOSYN ) IVPB 3.375 g        3.375 g 100 mL/hr over 30 Minutes Intravenous  Once 04/18/24 0034 04/18/24 0128   04/18/24 0045  vancomycin  (VANCOREADY) IVPB 2000 mg/400 mL        2,000 mg 200 mL/hr over 120 Minutes Intravenous  Once 04/18/24 0036 04/18/24 0324       REVIEW OF SYSTEMS:, Weakness Const:  fever,  chills,  weight loss Eyes: negative diplopia or visual changes,  negative eye pain ENT: negative coryza, negative sore throat Resp: negative cough, hemoptysis, dyspnea Cards: negative for chest pain, palpitations, lower extremity edema GU: As above urinary symptoms GI: Negative for abdominal pain, diarrhea, bleeding, constipation Skin: negative for rash and pruritus Heme: negative for easy bruising and gum/nose bleeding MS: Weakness Neurolo:negative for headaches, dizziness, vertigo, memory problems  Psych: negative for feelings of anxiety, depression  Endocrine:  diabetes Allergy/Immunology-Bactrim  and citalopram  VITALS:  BP (!) 81/48   Pulse (!) 102   Temp (!) 97.4 F (36.3 C) (Oral)   Resp (!) 22   Ht 5' 11 (1.803 m)   Wt 84.8 kg   SpO2 100%   BMI 26.08 kg/m   PHYSICAL EXAM:  General: Awake, lightly lethargic, responds to questions appropriately neck, oriented oriented in place person and time Head: Normocephalic, without obvious abnormality, atraumatic. Eyes: Conjunctivae clear,  anicteric sclerae. Pupils are equal ENT Nares normal. No drainage or sinus tenderness. Lips, mucosa, and tongue normal. No Thrush Neck: Supple, symmetrical, no adenopathy, thyroid: non tender no carotid bruit and no JVD. Back: No CVA tenderness. Lungs: Bilateral air entry  Heart: Tachycardia Abdomen: Soft, non-tender,not distended. Bowel sounds normal. No masses Extremities: atraumatic, no cyanosis. No edema. No clubbing Skin: No rashes or lesions. Or bruising Lymph: Cervical, supraclavicular normal. Neurologic: Grossly non-focal Pertinent Labs Lab Results CBC    Component Value Date/Time   WBC 17.1 (H) 04/18/2024 0905   RBC 3.41 (L) 04/18/2024 0905   HGB 10.9 (L) 04/18/2024 0905   HCT 32.4 (L) 04/18/2024 0905   PLT 157 04/18/2024 0905   MCV 95.0 04/18/2024 0905   MCH 32.0 04/18/2024 0905   MCHC 33.6 04/18/2024 0905   RDW 16.0 (H) 04/18/2024 0905   LYMPHSABS 1.0 04/17/2024 2330   MONOABS 1.6 (H) 04/17/2024 2330   EOSABS 0.1 04/17/2024 2330    BASOSABS 0.1 04/17/2024 2330       Latest Ref Rng & Units 04/18/2024    9:05 AM 04/18/2024    5:22 AM 04/17/2024   11:30 PM  CMP  Glucose 70 - 99 mg/dL 870   807   BUN 8 - 23 mg/dL 33   34   Creatinine 9.38 - 1.24 mg/dL 8.31  8.55  8.54   Sodium 135 - 145 mmol/L 140   136   Potassium 3.5 - 5.1 mmol/L 2.8   4.0   Chloride 98 - 111 mmol/L 112   106   CO2 22 - 32 mmol/L 20   18   Calcium  8.9 - 10.3 mg/dL 7.8   8.8   Total Protein 6.5 - 8.1 g/dL 5.3   7.8   Total Bilirubin 0.0 - 1.2 mg/dL 2.3   1.8   Alkaline Phos 38 - 126 U/L 84   114   AST 15 - 41 U/L 27   33   ALT 0 - 44 U/L 59   85       Microbiology: Recent Results (from the past 240 hours)  Blood culture (routine x 2)     Status: None (Preliminary result)   Collection Time: 04/17/24 11:29 PM   Specimen: Right Antecubital; Blood  Result Value Ref Range Status   Specimen Description RIGHT ANTECUBITAL  Final   Special Requests   Final    BOTTLES DRAWN AEROBIC AND ANAEROBIC Blood Culture adequate volume   Culture  Setup Time   Final    GRAM NEGATIVE RODS ANAEROBIC BOTTLE ONLY Organism ID to follow Performed at [facility], [address] [place], [place] [address]    Culture GRAM NEGATIVE RODS  Final   Report Status PENDING  Incomplete  Blood Culture ID Panel (Reflexed)     Status: Abnormal   Collection Time: 04/17/24 11:29 PM  Result Value Ref Range Status   Enterococcus faecalis NOT DETECTED NOT DETECTED Final   Enterococcus Faecium NOT DETECTED NOT DETECTED Final   Listeria monocytogenes NOT DETECTED NOT DETECTED Final   Staphylococcus species NOT DETECTED NOT DETECTED Final   Staphylococcus aureus (BCID) NOT DETECTED NOT DETECTED Final   Staphylococcus epidermidis NOT DETECTED NOT DETECTED Final   Staphylococcus lugdunensis NOT DETECTED NOT DETECTED Final   Streptococcus species NOT DETECTED NOT DETECTED Final   Streptococcus agalactiae NOT DETECTED NOT DETECTED Final   Streptococcus pneumoniae NOT  DETECTED NOT DETECTED Final   Streptococcus pyogenes NOT DETECTED NOT DETECTED Final  A.calcoaceticus-baumannii NOT DETECTED NOT DETECTED Final   Bacteroides fragilis NOT DETECTED NOT DETECTED Final   Enterobacterales DETECTED (A) NOT DETECTED Final    Comment: Enterobacterales represent a large order of gram negative bacteria, not a single organism.   Enterobacter cloacae complex NOT DETECTED NOT DETECTED Final   Escherichia coli DETECTED (A) NOT DETECTED Final    Comment: RESULT CALLED TO, READ BACK BY AND VERIFIED WITH: TREY GREENWOOD AT 1020 04/18/24 RAM    Klebsiella aerogenes NOT DETECTED NOT DETECTED Final   Klebsiella oxytoca NOT DETECTED NOT DETECTED Final   Klebsiella pneumoniae NOT DETECTED NOT DETECTED Final   Proteus species NOT DETECTED NOT DETECTED Final   Salmonella species NOT DETECTED NOT DETECTED Final   Serratia marcescens NOT DETECTED NOT DETECTED Final   Haemophilus influenzae NOT DETECTED NOT DETECTED Final   Neisseria meningitidis NOT DETECTED NOT DETECTED Final   Pseudomonas aeruginosa NOT DETECTED NOT DETECTED Final   Stenotrophomonas maltophilia NOT DETECTED NOT DETECTED Final   Candida albicans NOT DETECTED NOT DETECTED Final   Candida auris NOT DETECTED NOT DETECTED Final   Candida glabrata NOT DETECTED NOT DETECTED Final   Candida krusei NOT DETECTED NOT DETECTED Final   Candida parapsilosis NOT DETECTED NOT DETECTED Final   Candida tropicalis NOT DETECTED NOT DETECTED Final   Cryptococcus neoformans/gattii NOT DETECTED NOT DETECTED Final   CTX-M ESBL NOT DETECTED NOT DETECTED Final   Carbapenem resistance IMP NOT DETECTED NOT DETECTED Final   Carbapenem resistance KPC NOT DETECTED NOT DETECTED Final   Carbapenem resistance NDM NOT DETECTED NOT DETECTED Final   Carbapenem resist OXA 48 LIKE NOT DETECTED NOT DETECTED Final   Carbapenem resistance VIM NOT DETECTED NOT DETECTED Final    Comment: Performed at [facility], [address]  [place], [place] [address]  Blood culture (routine x 2)     Status: None (Preliminary result)   Collection Time: 04/18/24  2:46 AM   Specimen: BLOOD  Result Value Ref Range Status   Specimen Description BLOOD RIGHT ARM  Final   Special Requests   Final    BOTTLES DRAWN AEROBIC AND ANAEROBIC Blood Culture results may not be optimal due to an inadequate volume of blood received in culture bottles   Culture   Final    NO GROWTH < 12 HOURS Performed at [facility], [address] [place], [place] [address]    Report Status PENDING  Incomplete    IMAGING RESULTS:CT abdomen shows mildly dilated intrahepatic bile ducts in the left lobe.  Percutaneous biliary drainage catheters present in the duodenum no focal liver lesions. I have personally reviewed the films ? Impression/Recommendation 78 year old male with history of biliary obstruction E. coli bacteremia with sepsis following ERCP done yesterday Sources intestinal. Okay to continue ceftriaxone  as BCID did not show any ESBL ? Biliary obstruction.  Status post ERCP and stent replacement. Brushings have been sent and pathology is pending. Increase CA 19-9 Seen by oncology pending pathology  CAD status post CABG  Diabetes mellitus  Hypertension  AKI secondary to the infection  History of urethral strictures with frequent UTIs.  Was on suppressive therapy with nitrofurantoin   Discussed the management with the patient and his daughter.  ________________________________________________  Note:  This document was prepared using Dragon voice recognition software and may include unintentional dictation errors.

## 2024-04-18 NOTE — IPAL (Signed)
  Interdisciplinary Goals of Care Family Meeting   Date carried out: 04/18/2024  Location of the meeting: Phone conference  Member's involved: Physician, Bedside Registered Nurse, and Family Member or next of kin  Durable Power of Attorney or acting medical decision maker: wife and patient    Discussion: We discussed goals of care for Casey Peters .   I have reviewed medical records including EPIC notes, labs and imaging, assessed the patient and then met with wife to discuss major active diagnoses, plan of care, natural trajectory, prognosis, GOC, EOL wishes, disposition and options including Full code/DNI/DNR and the concept of comfort care if DNR is elected. Questions and concerns were addressed.  Wife spoke with her husband they are  in agreement to continue current plan of care . Election for DNR/DNI status.   Code status:   Code Status: Limited: Do not attempt resuscitation (DNR) -DNR-LIMITED -Do Not Intubate/DNI    Disposition: Continue current acute care  Time spent for the meeting: 30     Casey LULLA Solian, MD  04/18/2024, 5:46 AM

## 2024-04-18 NOTE — ED Provider Notes (Addendum)
 [facility] Provider Note    Event Date/Time   First MD Initiated Contact with Patient 04/17/24 2339     (approximate)   History   Post-op Problem   HPI  Casey Peters is a 78 y.o. male   Past medical history of recent biliary drain and ERCP, CAD, frequent urinary tract infections, heart failure, and type II diabetic who presents to the Emergency Department with fever and chills after his ERCP earlier today.  He noted some nausea transiently but none currently.  No abdominal pain.  He has had a very mild cough but no shortness of breath or chest pain.  He notes frequent urinary tract infections but denies any current dysuria or frequency.  He had a Tmax of just over 101 F.  He has experienced chills as well.  Independent Historian contributed to assessment above: His wife is at bedside to corroborate information given above  External Medical Documents Reviewed: Gastroenterology note from earlier this morning for ERCP for biliary stricture      Physical Exam   Triage Vital Signs: ED Triage Vitals  Encounter Vitals Group     BP 04/17/24 2327 107/63     Girls Systolic BP Percentile --      Girls Diastolic BP Percentile --      Boys Systolic BP Percentile --      Boys Diastolic BP Percentile --      Pulse Rate 04/17/24 2327 (!) 110     Resp 04/17/24 2327 17     Temp 04/17/24 2327 99.4 F (37.4 C)     Temp Source 04/17/24 2327 Oral     SpO2 04/17/24 2323 96 %     Weight 04/17/24 2325 187 lb (84.8 kg)     Height 04/17/24 2325 5' 11 (1.803 m)     Head Circumference --      Peak Flow --      Pain Score 04/17/24 2325 0     Pain Loc --      Pain Education --      Exclude from Growth Chart --     Most recent vital signs: Vitals:   04/18/24 0128 04/18/24 0131  BP:  (!) 144/120  Pulse: (!) 151 (!) 151  Resp: (!) 31 (!) 32  Temp:  98.9 F (37.2 C)  SpO2: 100% 97%    General: Awake, no distress.  CV:  Good peripheral  perfusion.  Resp:  Normal effort.  Abd:  No distention.  Other:  Biliary drain in place to the right side abdomen with no cellulitic changes or purulence from the drain site.  Soft benign abdominal exam deep palpation all quadrants.  Patient awake alert and pleasant.  His blood pressure is 100s over 60s.  He has a pulse of 110.  His lungs are clear without obvious focality or wheezing.   ED Results / Procedures / Treatments   Labs (all labs ordered are listed, but only abnormal results are displayed) Labs Reviewed  COMPREHENSIVE METABOLIC PANEL WITH GFR - Abnormal; Notable for the following components:      Result Value   CO2 18 (*)    Glucose, Bld 192 (*)    BUN 34 (*)    Creatinine, Ser 1.45 (*)    Calcium  8.8 (*)    Albumin 3.2 (*)    ALT 85 (*)    Total Bilirubin 1.8 (*)    GFR, Estimated 50 (*)    All other components within normal  limits  CBC WITH DIFFERENTIAL/PLATELET - Abnormal; Notable for the following components:   WBC 18.7 (*)    RBC 4.08 (*)    HCT 38.5 (*)    RDW 15.8 (*)    Neutro Abs 15.8 (*)    Monocytes Absolute 1.6 (*)    Abs Immature Granulocytes 0.19 (*)    All other components within normal limits  CULTURE, BLOOD (ROUTINE X 2)  CULTURE, BLOOD (ROUTINE X 2)  LACTIC ACID, PLASMA  LACTIC ACID, PLASMA  BRAIN NATRIURETIC PEPTIDE     I ordered and reviewed the above labs they are notable for white blood cell count is 18  EKG  ED ECG REPORT I, Ginnie Shams, the attending physician, personally viewed and interpreted this ECG.   Date: 04/18/2024  EKG Time: 2331  Rate: 100  Rhythm: sinus tachycardia  Axis: nl  Intervals:nl  ST&T Change: no stemi    RADIOLOGY I independently reviewed and interpreted CT angiogram and see no obvious PE I also reviewed radiologist's formal read.   PROCEDURES:  Critical Care performed: Yes, see critical care procedure note(s)  .Critical Care  Performed by: Shams Ginnie, MD Authorized by: Shams Ginnie, MD    Critical care provider statement:    Critical care time (minutes):  50   Critical care was time spent personally by me on the following activities:  Development of treatment plan with patient or surrogate, discussions with consultants, evaluation of patient's response to treatment, examination of patient, ordering and review of laboratory studies, ordering and review of radiographic studies, ordering and performing treatments and interventions, pulse oximetry, re-evaluation of patient's condition and review of old charts    MEDICATIONS ORDERED IN ED: Medications  vancomycin  (VANCOREADY) IVPB 2000 mg/400 mL (2,000 mg Intravenous New Bag/Given 04/18/24 0114)  acetaminophen  (TYLENOL ) tablet 1,000 mg (has no administration in time range)  ibuprofen  (ADVIL ) tablet 600 mg (has no administration in time range)  sodium chloride  0.9 % bolus 1,000 mL (0 mLs Intravenous Paused 04/18/24 0129)  piperacillin -tazobactam (ZOSYN ) IVPB 3.375 g (0 g Intravenous Stopped 04/18/24 0128)  iohexol  (OMNIPAQUE ) 350 MG/ML injection 100 mL (100 mLs Intravenous Contrast Given 04/18/24 0148)    External physician / consultants:  I spoke with hospital medicine for admission and regarding care plan for this patient.   IMPRESSION / MDM / ASSESSMENT AND PLAN / ED COURSE  I reviewed the triage vital signs and the nursing notes.                                Patient's presentation is most consistent with acute presentation with potential threat to life or bodily function.  Differential diagnosis includes, but is not limited to, sepsis due to intra-abdominal infection, pneumonia, urinary tract infection, URI   The patient is on the cardiac monitor to evaluate for evidence of arrhythmia and/or significant heart rate changes.  MDM:    Patient meets SIRS criteria with tachycardia leukocytosis and a Tmax of 101 at home today.  His source of infection is broad including pneumonia with his mild cough, intra-abdominal infection  with the recent procedures and biliary drain in place, urinary tract infection given his  frequent urinary tract infections.  Got broad-spectrum antibiotics ordered at the onset, give a liter of fluids but defer a further 30 cc/kg ideal body weight fluid bolus given his history of heart failure with reduced ejection fraction at this juncture with a normal lactic and normotension no  signs of hypoperfusion.  Will order CT scan chest abdomen pelvis with IV contrast to help identify source.  Check urinary tract infection with UTI as well.  He will be admitted.   -- Patient got tachycardic to the 140s and hypoxemic requiring new nasal cannula oxygen.  He was having rigors. Repeat EKG looks like sinus tachycardia. Bedside ultrasound shows grossly normal EF with no obvious pericardial effusion right heart strain, no B-lines.  No overt US  findings of pulmonary edema but flash pulmonary edema is on differential with hx of HFrEF so can finish initial liter NS but defer further fluids for now.   After his rigors, his tachycardia resolved back now in the low 100s without intervention. PE on differential as well so changed CT chest to angiogram.   He remained normotensive the entire time of the episode, in fact.BP improved from initial after fluids. CT scan has resulted with no pulmonary embolism or intra-abdominal abnormalities, expected postsurgical changes noted, but does show evidence of multifocal pneumonia. VS stable and hospitalist consulted for  admission for multifocal pneumonia, hypoxemic respiratory failure, sepsis.      FINAL CLINICAL IMPRESSION(S) / ED DIAGNOSES   Final diagnoses:  Sepsis, due to unspecified organism, unspecified whether acute organ dysfunction present (HCC)  Fever, unspecified fever cause  Multifocal pneumonia  Acute respiratory failure with hypoxemia (HCC)     Rx / DC Orders   ED Discharge Orders     None        Note:  This document was prepared using  Dragon voice recognition software and may include unintentional dictation errors.    Cyrena Mylar, MD 04/18/24 [id number]    Cyrena Mylar, MD 04/18/24 [phone]

## 2024-04-18 NOTE — Assessment & Plan Note (Signed)
 Expecting improvement with IV fluid resuscitation Monitor renal function and avoid nephrotoxins

## 2024-04-18 NOTE — Assessment & Plan Note (Signed)
 S/p ERCP 03/15/2024 and 04/17/2024, cytology pending No acute procedural complications suspected CT abdomen and pelvis nonacute LFTs reassuring Can consider GI consult to follow

## 2024-04-18 NOTE — Consult Note (Signed)
 Rogelia Copping, MD [facility]  [address], [address] [place], [place] [address] Phone: [phone] Fax : [phone]  Consultation  Referring Provider:     Dr. Von Primary Care Physician:  Rudolpho Norleen BIRCH, MD Primary Gastroenterologist:  Dr. Copping         Reason for Consultation:     Sepsis  Date of Admission:  04/17/2024 Date of Consultation:  04/18/2024         HPI:   Casey Peters is a 78 y.o. male who has a positive brushings for atypical cells of the common bile duct who presented with jaundice back in June with a PTC of the right hepatic system and a biliary stent in the left hepatic system with improvement of the liver enzymes.  The patient's wife states that the patient has not felt well for about 6 weeks with weakness and inability to get around.  He has also had a slight cough recently.  She states that his last echo showed a very reduced ejection fraction and he has a history of congestive heart failure.  The patient underwent an ERCP yesterday with the left-sided stent being exchanged and a brushing of the bile ducts.  The patient also had a PET scan that showed areas that were abnormal in addition to the patient's CA 19-9 being elevated.  The patient had a CT scan of the abdomen and pelvis that showed:  IMPRESSION: 1. Postprocedural changes in the liver with percutaneous biliary drain in place. Tip is in the duodenum. Residual contrast material within mildly dilated left intrahepatic bile ducts. Gas in the gallbladder is also likely postprocedural. 2. No abscess or free air in the abdomen. 3. No evidence of significant pulmonary embolus. 4. Patchy peripheral infiltrates in the lungs possibly representing multifocal pneumonia or scarring. 5. Scattered nodules in the lungs, largest in the right middle lung measuring 7 mm diameter. Non-contrast chest CT at 3-6 months is recommended. If the nodules are stable at time of repeat CT, then future CT at 18-24 months (from  today's scan) is considered optional for low-risk patients, but is recommended for high-risk patients. This recommendation follows the consensus statement: Guidelines for Management of Incidental Pulmonary Nodules Detected on CT Images: From the Fleischner Society 2017; Radiology 2017; 284:228-243. 6. Aortic atherosclerosis. 7. 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  The wife had called our office yesterday stating that the patient was running a fever of 100 without any other symptoms at that time.  She states that he had increasing coughing yesterday with 3 spells of uncontrolled coughing.  The patient was told by my office that if he did not feel better he should go to the emergency room.  The procedure was uneventful without any respiratory compromise during the procedure and no report of any coughing or signs of aspiration during or after the procedure.  The patient is now admitted with hypotension and tachycardia and a diagnosis of sepsis.  Past Medical History:  Diagnosis Date   Cardiomegaly    CHF (congestive heart failure) (HCC)    Constipation    Diabetic polyneuropathy (HCC)    Dilated idiopathic cardiomyopathy (HCC)    Erectile dysfunction    HLD (hyperlipidemia)    HTN (hypertension)    Low testosterone     Palpitations    Pneumonia    T2DM (type 2 diabetes mellitus) (HCC)     Past Surgical History:  Procedure Laterality Date   CORONARY/GRAFT ACUTE MI REVASCULARIZATION N/A 12/27/2021   Procedure: Coronary/Graft Acute MI  Revascularization;  Surgeon: Mady Bruckner, MD;  Location: [facility]IVE [facility];  Service: Cardiovascular;  Laterality: N/A;   CYSTOSCOPY WITH URETHRAL DILATATION N/A 06/17/2021   Procedure: CYSTOSCOPY WITH URETHRAL DILATATION;  Surgeon: Twylla Glendia BROCKS, MD;  Location: [facility] ORS;  Service: Urology;  Laterality: N/A;  Optilume Balloon   ERCP N/A 03/15/2024   Procedure: ERCP, WITH INTERVENTION IF INDICATED;  Surgeon: Jinny Carmine, MD;  Location: [facility] ENDOSCOPY;   Service: Endoscopy;  Laterality: N/A;   ERCP N/A 04/17/2024   Procedure: ERCP, WITH INTERVENTION IF INDICATED;  Surgeon: Jinny Carmine, MD;  Location: [facility] ENDOSCOPY;  Service: Endoscopy;  Laterality: N/A;   HERNIA REPAIR  1998   IR BILIARY DRAIN PLACEMENT WITH CHOLANGIOGRAM  03/16/2024   LEFT HEART CATH AND CORONARY ANGIOGRAPHY N/A 12/27/2021   Procedure: LEFT HEART CATH AND CORONARY ANGIOGRAPHY;  Surgeon: Mady Bruckner, MD;  Location: [facility] CV [facility];  Service: Cardiovascular;  Laterality: N/A;   PILONIDAL CYST EXCISION     TONSILLECTOMY     URETHRA SURGERY  1995    Prior to Admission medications   Medication Sig Start Date End Date Taking? Authorizing Provider  ascorbic acid (VITAMIN C) 500 MG tablet Take 500 mg by mouth daily. Patient not taking: Reported on 03/28/2024    [provider]  aspirin  EC 81 MG tablet Take 81 mg by mouth daily.    [provider]  carvedilol  (COREG ) 6.25 MG tablet Take 6.25 mg by mouth 2 (two) times daily with a meal.    [provider]  cholecalciferol (VITAMIN D3) 25 MCG (1000 UNIT) tablet Take 1,000 Units by mouth daily. Patient not taking: Reported on 03/28/2024    [provider]  citalopram (CELEXA) 20 MG tablet Take 20 mg by mouth daily. Patient not taking: Reported on 03/28/2024 02/03/24   [provider]  furosemide  (LASIX ) 40 MG tablet Take 40 mg by mouth daily.    [provider]  glimepiride (AMARYL) 1 MG tablet Take 1 mg by mouth daily with breakfast. Patient not taking: Reported on 03/28/2024 03/03/24 03/03/25  [provider]  glimepiride (AMARYL) 2 MG tablet Take 2 mg by mouth. 03/21/24 03/21/25  [provider]  JANUVIA 50 MG tablet Take 1 tablet by mouth daily. 02/29/24 02/28/25  [provider]  metFORMIN  (GLUCOPHAGE ) 1000 MG tablet Take 1,000 mg by mouth 2 (two) times daily. Patient not taking: Reported on 03/28/2024 11/30/23   [provider]  naloxone   (NARCAN ) nasal spray 4 mg/0.1 mL SPRAY 1 SPRAY INTO ONE NOSTRIL AS DIRECTED FOR OPIOID OVERDOSE (TURN PERSON ON SIDE AFTER DOSE. IF NO RESPONSE IN 2-3 MINUTES OR PERSON RESPONDS BUT RELAPSES, REPEAT USING A NEW SPRAY DEVICE AND SPRAY INTO THE OTHER NOSTRIL. CALL 911 AFTER USE.) * EMERGENCY USE ONLY * 04/05/24   Borders, Fonda SAUNDERS, NP  nitrofurantoin , macrocrystal-monohydrate, (MACROBID ) 100 MG capsule TAKE 1 CAPSULE BY MOUTH ONCE DAILY 11/18/23   Vaillancourt, Samantha, PA-C  omeprazole  (PRILOSEC) 20 MG capsule Take 1 capsule (20 mg total) by mouth daily. 04/05/24   Borders, Fonda SAUNDERS, NP  ondansetron  (ZOFRAN ) 8 MG tablet One pill every 8 hours as needed for nausea/vomitting. 03/28/24   Brahmanday, Govinda R, MD  oxyCODONE  (OXY IR/ROXICODONE ) 5 MG immediate release tablet Take 1 tablet (5 mg total) by mouth every 8 (eight) hours as needed for severe pain (pain score 7-10). 04/05/24   Borders, Fonda SAUNDERS, NP  senna (SENOKOT) 8.6 MG TABS tablet Take 1 tablet (8.6 mg total) by  mouth daily as needed for mild constipation. 04/05/24   Borders, Fonda SAUNDERS, NP  silodosin  (RAPAFLO ) 8 MG CAPS capsule Take 1 capsule (8 mg total) by mouth daily with breakfast. 12/10/23   Vaillancourt, Lucie, PA-C  spironolactone  (ALDACTONE ) 25 MG tablet Take 12.5 mg by mouth daily. 01/04/24   [provider]  testosterone  (ANDROGEL ) 50 MG/5GM (1%) GEL Place 5 g onto the skin daily. Please confirm with your cardiologist before resuming 12/29/21   Amin, Sumayya, MD  Zinc Gluconate 30 MG TABS Take 30 mg by mouth daily. Patient not taking: Reported on 03/28/2024    [provider]    Family History  Problem Relation Age of Onset   Hematuria Father    Kidney disease Neg Hx    Prostate cancer Neg Hx      Social History   Tobacco Use   Smoking status: Every Day    Types: Cigars   Smokeless tobacco: Never   Tobacco comments:    2-3 cigars per day  Vaping Use   Vaping status: Never Used  Substance Use Topics   Alcohol  use: No    Alcohol/week: 0.0 standard drinks of alcohol   Drug use: No    Allergies as of 04/17/2024 - Review Complete 04/17/2024  Allergen Reaction Noted   Citalopram Nausea And Vomiting 03/14/2024   Sulfamethoxazole -trimethoprim   06/05/2021   Trimethoprim  Other (See Comments) 05/05/2022    Review of Systems:    All systems reviewed and negative except where noted in HPI.   Physical Exam:  Vital signs in last 24 hours: Temp:  [97 F (36.1 C)-99.4 F (37.4 C)] 97.4 F (36.3 C) (07/08 0921) Pulse Rate:  [27-152] 102 (07/08 1000) Resp:  [15-36] 22 (07/08 1000) BP: (56-160)/(35-121) 94/47 (07/08 1000) SpO2:  [62 %-100 %] 100 % (07/08 1000) Weight:  [84.8 kg] 84.8 kg (07/07 2325)   General:   Pleasant, cooperative in NAD Head:  Normocephalic and atraumatic. Eyes:   No icterus.   Conjunctiva pink. PERRLA. Ears:  Normal auditory acuity. Neck:  Supple; no masses or thyroidomegaly Rectal:  Not performed. Skin:  Intact without significant lesions or rashes. Psych:  Alert and cooperative. Normal affect.  LAB RESULTS: Recent Labs    04/17/24 2330 04/18/24 0905  WBC 18.7* 17.1*  HGB 13.2 10.9*  HCT 38.5* 32.4*  PLT 196 157   BMET Recent Labs    04/17/24 1133 04/17/24 2330 04/18/24 0522 04/18/24 0905  NA 138 136  --  140  K 4.3 4.0  --  2.8*  CL 106 106  --  112*  CO2 22 18*  --  20*  GLUCOSE 163* 192*  --  129*  BUN 33* 34*  --  33*  CREATININE 1.24 1.45* 1.44* 1.68*  CALCIUM  9.0 8.8*  --  7.8*   LFT Recent Labs    04/17/24 2330  PROT 7.8  ALBUMIN 3.2*  AST 33  ALT 85*  ALKPHOS 114  BILITOT 1.8*   PT/INR No results for input(s): LABPROT, INR in the last 72 hours.  STUDIES: CT Angio Chest PE W/Cm &/Or Wo Cm Result Date: 04/18/2024 CLINICAL DATA:  Pulmonary embolus suspected with high probability. Fever developed after biliary procedure today. EXAM: CT ANGIOGRAPHY CHEST CT ABDOMEN AND PELVIS WITH CONTRAST TECHNIQUE: Multidetector CT imaging of the  chest was performed using the standard protocol during bolus administration of intravenous contrast. Multiplanar CT image reconstructions and MIPs were obtained to evaluate the vascular anatomy. Multidetector CT imaging of the  abdomen and pelvis was performed using the standard protocol during bolus administration of intravenous contrast. RADIATION DOSE REDUCTION: This exam was performed according to the departmental dose-optimization program which includes automated exposure control, adjustment of the mA and/or kV according to patient size and/or use of iterative reconstruction technique. CONTRAST:  OMNIPAQUE  IOHEXOL  350 MG/ML SOLN COMPARISON:  PET CT 04/12/2024.  CT chest 08/14/2020 FINDINGS: CTA CHEST FINDINGS Cardiovascular: Technically adequate study with good opacification of the central and segmental pulmonary arteries. Moderate motion artifact. No focal filling defects are demonstrated. No evidence of significant pulmonary embolus. Normal heart size. No pericardial effusions. Normal caliber thoracic aorta. No dissection. Great vessel origins are patent. Mediastinum/Nodes: No enlarged mediastinal, hilar, or axillary lymph nodes. Thyroid gland, trachea, and esophagus demonstrate no significant findings. Lungs/Pleura: Patchy peripheral infiltrates in the lungs may represent scarring or multifocal pneumonia. Similar appearance to previous study. No pleural effusion or pneumothorax. 7 mm diameter nodule in the right middle lung, series 3, image 70. Additional smaller nodule seen in both lungs. Musculoskeletal: Degenerative changes in the spine. No acute bony abnormalities. Review of the MIP images confirms the above findings. CT ABDOMEN and PELVIS FINDINGS Hepatobiliary: Residual contrast material demonstrated within mildly dilated intrahepatic bile ducts in the left lobe. A percutaneous biliary drainage catheter is present with tip in the duodenum. No focal liver lesions are seen. Extrahepatic bile ducts  are not dilated. Gas in the gallbladder is likely result of procedure. Pancreas: Unremarkable. No pancreatic ductal dilatation or surrounding inflammatory changes. Spleen: Normal in size without focal abnormality. Adrenals/Urinary Tract: Adrenal glands are unremarkable. Kidneys are normal, without renal calculi, focal lesion, or hydronephrosis. Bladder is unremarkable. Stomach/Bowel: Stomach, small bowel, and colon are not abnormally distended. No wall thickening or inflammatory changes. Appendix is not identified. Vascular/Lymphatic: Aortic atherosclerosis. No enlarged abdominal or pelvic lymph nodes. Reproductive: Prostate gland is enlarged Other: No free air or free fluid in the abdomen. No loculated collections are identified. Abdominal wall musculature appears intact. Musculoskeletal: Degenerative changes in the spine. No acute bony abnormalities. Review of the MIP images confirms the above findings. IMPRESSION: 1. Postprocedural changes in the liver with percutaneous biliary drain in place. Tip is in the duodenum. Residual contrast material within mildly dilated left intrahepatic bile ducts. Gas in the gallbladder is also likely postprocedural. 2. No abscess or free air in the abdomen. 3. No evidence of significant pulmonary embolus. 4. Patchy peripheral infiltrates in the lungs possibly representing multifocal pneumonia or scarring. 5. Scattered nodules in the lungs, largest in the right middle lung measuring 7 mm diameter. Non-contrast chest CT at 3-6 months is recommended. If the nodules are stable at time of repeat CT, then future CT at 18-24 months (from today's scan) is considered optional for low-risk patients, but is recommended for high-risk patients. This recommendation follows the consensus statement: Guidelines for Management of Incidental Pulmonary Nodules Detected on CT Images: From the Fleischner Society 2017; Radiology 2017; 284:228-243. 6. Aortic atherosclerosis. 7. 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  Electronically Signed   By: Elsie Gravely M.D.   On: 04/18/2024 02:13   CT ABDOMEN PELVIS W CONTRAST Result Date: 04/18/2024 CLINICAL DATA:  Pulmonary embolus suspected with high probability. Fever developed after biliary procedure today. EXAM: CT ANGIOGRAPHY CHEST CT ABDOMEN AND PELVIS WITH CONTRAST TECHNIQUE: Multidetector CT imaging of the chest was performed using the standard protocol during bolus administration of intravenous contrast. Multiplanar CT image reconstructions and MIPs were obtained to evaluate the vascular anatomy. Multidetector CT imaging of the  abdomen and pelvis was performed using the standard protocol during bolus administration of intravenous contrast. RADIATION DOSE REDUCTION: This exam was performed according to the departmental dose-optimization program which includes automated exposure control, adjustment of the mA and/or kV according to patient size and/or use of iterative reconstruction technique. CONTRAST:  OMNIPAQUE  IOHEXOL  350 MG/ML SOLN COMPARISON:  PET CT 04/12/2024.  CT chest 08/14/2020 FINDINGS: CTA CHEST FINDINGS Cardiovascular: Technically adequate study with good opacification of the central and segmental pulmonary arteries. Moderate motion artifact. No focal filling defects are demonstrated. No evidence of significant pulmonary embolus. Normal heart size. No pericardial effusions. Normal caliber thoracic aorta. No dissection. Great vessel origins are patent. Mediastinum/Nodes: No enlarged mediastinal, hilar, or axillary lymph nodes. Thyroid gland, trachea, and esophagus demonstrate no significant findings. Lungs/Pleura: Patchy peripheral infiltrates in the lungs may represent scarring or multifocal pneumonia. Similar appearance to previous study. No pleural effusion or pneumothorax. 7 mm diameter nodule in the right middle lung, series 3, image 70. Additional smaller nodule seen in both lungs. Musculoskeletal: Degenerative changes in the spine. No acute bony  abnormalities. Review of the MIP images confirms the above findings. CT ABDOMEN and PELVIS FINDINGS Hepatobiliary: Residual contrast material demonstrated within mildly dilated intrahepatic bile ducts in the left lobe. A percutaneous biliary drainage catheter is present with tip in the duodenum. No focal liver lesions are seen. Extrahepatic bile ducts are not dilated. Gas in the gallbladder is likely result of procedure. Pancreas: Unremarkable. No pancreatic ductal dilatation or surrounding inflammatory changes. Spleen: Normal in size without focal abnormality. Adrenals/Urinary Tract: Adrenal glands are unremarkable. Kidneys are normal, without renal calculi, focal lesion, or hydronephrosis. Bladder is unremarkable. Stomach/Bowel: Stomach, small bowel, and colon are not abnormally distended. No wall thickening or inflammatory changes. Appendix is not identified. Vascular/Lymphatic: Aortic atherosclerosis. No enlarged abdominal or pelvic lymph nodes. Reproductive: Prostate gland is enlarged Other: No free air or free fluid in the abdomen. No loculated collections are identified. Abdominal wall musculature appears intact. Musculoskeletal: Degenerative changes in the spine. No acute bony abnormalities. Review of the MIP images confirms the above findings. IMPRESSION: 1. Postprocedural changes in the liver with percutaneous biliary drain in place. Tip is in the duodenum. Residual contrast material within mildly dilated left intrahepatic bile ducts. Gas in the gallbladder is also likely postprocedural. 2. No abscess or free air in the abdomen. 3. No evidence of significant pulmonary embolus. 4. Patchy peripheral infiltrates in the lungs possibly representing multifocal pneumonia or scarring. 5. Scattered nodules in the lungs, largest in the right middle lung measuring 7 mm diameter. Non-contrast chest CT at 3-6 months is recommended. If the nodules are stable at time of repeat CT, then future CT at 18-24 months (from  today's scan) is considered optional for low-risk patients, but is recommended for high-risk patients. This recommendation follows the consensus statement: Guidelines for Management of Incidental Pulmonary Nodules Detected on CT Images: From the Fleischner Society 2017; Radiology 2017; 284:228-243. 6. Aortic atherosclerosis. 7. 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 Electronically Signed   By: Elsie Gravely M.D.   On: 04/18/2024 02:13   DG C-Arm 1-60 Min-No Report Result Date: 04/17/2024 Fluoroscopy was utilized by the requesting physician.  No radiographic interpretation.      Impression / Plan:   Assessment: Principal Problem:   HCAP (healthcare-associated pneumonia) Active Problems:   Biliary stenosis   Severe sepsis (HCC)   Acute respiratory failure with hypoxia (HCC)   AKI (acute kidney injury) (HCC)   Casey Peters is a  78 y.o. y/o male with sepsis and a cough with a abnormal CT scan with a previous diagnosis of a biliary stricture with atypical cells.  It is most likely that this lesion in the liver is a malignancy and is being worked up as such.  The patient was now admitted with worsening cough over the last 24 hours with a report of a previous cough for the last few weeks.  Plan:  The patient should be treated for his sepsis.  It is unlikely that the stent has been, infected in such a short amount of time since it was only placed yesterday to replace the previously placed stent. We are waiting for the cytology results from the brushings of the duct done yesterday. I would continue treating the patient symptomatically at this time for his sepsis and hypotension.  I will follow along with you at this time to see if any thing should change to suggest bili obstruction.  The patient's bilirubin did go up to 2.3 from 1.8 yesterday but only 1.3 his direct wall 1.0 is indirect.  With the ALT being even less than it was yesterday.  Thank you for involving me in the care of this patient.       LOS: 0 days   Rogelia Copping, MD, MD. [facility] 04/18/2024, 10:08 AM,  Pager [phone] 7am-5pm  Check AMION for 5pm -7am coverage and on weekends   Note: This dictation was prepared with Dragon dictation along with smaller phrase technology. Any transcriptional errors that result from this process are unintentional.

## 2024-04-18 NOTE — Consult Note (Signed)
 [facility] Pulmonary Medicine Consultation      Date: 04/18/2024,   MRN# [id number] Casey Peters [birth year]     CHIEF COMPLAINT:   SEPTIC SHOCK   HISTORY OF PRESENT ILLNESS   78 y.o. male with medical history significant for dilated cardiomyopathy, CAD RCA stent in 2023, HTN, HLD, IIDM, BPH with history of recurrent UTIs on suppressive antibiotics, recently hospitalized with painless jaundice, s/p ERCP  with biliary stent placement in 03/15/24, exchanged yesterday 04/17/24, with brushings sent to cytology who is being admitted for sepsis,With E coli and ENTEROBACTER SPECIES  In the ED tachycardic to the 140s to 150s and tachypneic to the 30s.  BP initially 107/63 but improving to the 160s following hydration.  O2 sat initially in the 90s on room air but desatted down to as low as 62 requiring 5 L O2 via nasal cannula to maintain sats in the high 90s. Labs notable for WBC 18,000 with lactic acid 1.3-->2.3.   Creatinine 1.45 up from baseline of 0.76 a month ago.  LFTs reassuring compared to prior.  BNP normal.   EKG with sinus tachycardia at 147  CT abdomen and pelvis showing postsurgical changes with drains in place and no acute findings.  And CTA PE protocol negative for PE but showing patchy peripheral infiltrates possibly representing multifocal pneumonia. Patient given sepsis fluid bolus and started on Zosyn  and vancomycin  for possible intra-abdominal source.  ICU CONSULTED FOR LOW BP AND STARTED PRESSORS ICU transfer required   PAST MEDICAL HISTORY   Past Medical History:  Diagnosis Date   Cardiomegaly    CHF (congestive heart failure) (HCC)    Constipation    Diabetic polyneuropathy (HCC)    Dilated idiopathic cardiomyopathy (HCC)    Erectile dysfunction    HLD (hyperlipidemia)    HTN (hypertension)    Low testosterone     Palpitations    Pneumonia    T2DM (type 2 diabetes mellitus) (HCC)      SURGICAL HISTORY   Past Surgical History:  Procedure Laterality  Date   CORONARY/GRAFT ACUTE MI REVASCULARIZATION N/A 12/27/2021   Procedure: Coronary/Graft Acute MI Revascularization;  Surgeon: Mady Bruckner, MD;  Location: [facility] INVASIVE CV LAB;  Service: Cardiovascular;  Laterality: N/A;   CYSTOSCOPY WITH URETHRAL DILATATION N/A 06/17/2021   Procedure: CYSTOSCOPY WITH URETHRAL DILATATION;  Surgeon: Twylla Glendia BROCKS, MD;  Location: [facility] ORS;  Service: Urology;  Laterality: N/A;  Optilume Balloon   ERCP N/A 03/15/2024   Procedure: ERCP, WITH INTERVENTION IF INDICATED;  Surgeon: Jinny Carmine, MD;  Location: [facility] ENDOSCOPY;  Service: Endoscopy;  Laterality: N/A;   ERCP N/A 04/17/2024   Procedure: ERCP, WITH INTERVENTION IF INDICATED;  Surgeon: Jinny Carmine, MD;  Location: [facility] ENDOSCOPY;  Service: Endoscopy;  Laterality: N/A;   HERNIA REPAIR  1998   IR BILIARY DRAIN PLACEMENT WITH CHOLANGIOGRAM  03/16/2024   LEFT HEART CATH AND CORONARY ANGIOGRAPHY N/A 12/27/2021   Procedure: LEFT HEART CATH AND CORONARY ANGIOGRAPHY;  Surgeon: Mady Bruckner, MD;  Location: [facility] INVASIVE CV LAB;  Service: Cardiovascular;  Laterality: N/A;   PILONIDAL CYST EXCISION     TONSILLECTOMY     URETHRA SURGERY  1995     FAMILY HISTORY   Family History  Problem Relation Age of Onset   Hematuria Father    Kidney disease Neg Hx    Prostate cancer Neg Hx      SOCIAL HISTORY   Social History   Tobacco Use   Smoking status: Every Day    Types:  Cigars   Smokeless tobacco: Never   Tobacco comments:    2-3 cigars per day  Vaping Use   Vaping status: Never Used  Substance Use Topics   Alcohol use: No    Alcohol/week: 0.0 standard drinks of alcohol   Drug use: No     MEDICATIONS    Home Medication:  Current Outpatient Rx   Order #: [id number] Class: Historical Med   Order #: [id number] Class: Historical Med   Order #: [id number] Class: Historical Med   Order #: [id number] Class: Historical Med   Order #: [id number] Class: Historical Med   Order #: [id number] Class: Historical  Med   Order #: [id number] Class: Historical Med   Order #: [id number] Class: Historical Med   Order #: [id number] Class: Historical Med   Order #: [id number] Class: Historical Med   Order #: [id number] Class: Normal   Order #: [id number] Class: Normal   Order #: [id number] Class: Normal   Order #: [id number] Class: Normal   Order #: [id number] Class: Normal   Order #: [id number] Class: Normal   Order #: [id number] Class: Normal   Order #: [id number] Class: Historical Med   Order #: [id number] Class: No Print   Order #: [id number] Class: Historical Med    Current Medication:  Current Facility-Administered Medications:    0.9 %  sodium chloride  infusion, 250 mL, Intravenous, Continuous, Clair Marolyn NOVAK, RPH, Last Rate: 10 mL/hr at 04/18/24 1238, 250 mL at 04/18/24 1238   acetaminophen  (TYLENOL ) tablet 650 mg, 650 mg, Oral, Q6H PRN **OR** acetaminophen  (TYLENOL ) suppository 650 mg, 650 mg, Rectal, Q6H PRN, Cleatus Delayne GAILS, MD   albuterol  (PROVENTIL ) (2.5 MG/3ML) 0.083% nebulizer solution 2.5 mg, 2.5 mg, Nebulization, Q2H PRN, Cleatus Delayne GAILS, MD   cefTRIAXone  (ROCEPHIN ) 2 g in sodium chloride  0.9 % 100 mL IVPB, 2 g, Intravenous, Q24H, Ginamarie Banfield, MD   guaiFENesin  (MUCINEX ) 12 hr tablet 600 mg, 600 mg, Oral, BID, Von Bellis, MD, 600 mg at 04/18/24 1104   heparin  injection 5,000 Units, 5,000 Units, Subcutaneous, Q8H, Von Bellis, MD   insulin  aspart (novoLOG ) injection 0-9 Units, 0-9 Units, Subcutaneous, Q4H, Cleatus Delayne GAILS, MD   lactated ringers  infusion, , Intravenous, Continuous, Von Bellis, MD, Last Rate: 100 mL/hr at 04/18/24 1100, New Bag at 04/18/24 1100   metroNIDAZOLE  (FLAGYL ) IVPB 500 mg, 500 mg, Intravenous, Q12H, Cleatus Delayne GAILS, MD, Stopped at 04/18/24 9271   midodrine  (PROAMATINE ) tablet 10 mg, 10 mg, Oral, TID WC, Von Bellis, MD, 10 mg at 04/18/24 1203   norepinephrine  (LEVOPHED ) 4mg  in (0.016 mg/mL) premix infusion, 0-10 mcg/min, Intravenous, Titrated, Clair Marolyn NOVAK, RPH, Last  Rate: 18.75 mL/hr at 04/18/24 1238, 5 mcg/min at 04/18/24 1238   ondansetron  (ZOFRAN ) tablet 4 mg, 4 mg, Oral, Q6H PRN **OR** ondansetron  (ZOFRAN ) injection 4 mg, 4 mg, Intravenous, Q6H PRN, Cleatus Delayne GAILS, MD   potassium chloride  10 mEq in 100 mL IVPB, 10 mEq, Intravenous, Q1 Hr x 4, Von Bellis, MD, Last Rate: 100 mL/hr at 04/18/24 1218, 10 mEq at 04/18/24 1218   potassium PHOSPHATE  30 mmol in dextrose  5 % 500 mL infusion, 30 mmol, Intravenous, Once, Von Bellis, MD, Last Rate: 85 mL/hr at 04/18/24 1255, 30 mmol at 04/18/24 1255   tamsulosin  (FLOMAX ) capsule 0.4 mg, 0.4 mg, Oral, QPC supper, Von Bellis, MD   NOREEN ON 04/19/2024] vancomycin  (VANCOREADY) IVPB 1250 mg/250 mL, 1,250 mg, Intravenous, Q24H, Belue, Rankin RAMAN, RPH  Current Outpatient Medications:    ascorbic acid (VITAMIN C) 500 MG tablet, Take 500 mg by mouth daily. (Patient not taking: Reported on 03/28/2024), Disp: ,  Rfl:    aspirin  EC 81 MG tablet, Take 81 mg by mouth daily., Disp: , Rfl:    carvedilol  (COREG ) 6.25 MG tablet, Take 6.25 mg by mouth 2 (two) times daily with a meal., Disp: , Rfl:    cholecalciferol (VITAMIN D3) 25 MCG (1000 UNIT) tablet, Take 1,000 Units by mouth daily. (Patient not taking: Reported on 03/28/2024), Disp: , Rfl:    citalopram (CELEXA) 20 MG tablet, Take 20 mg by mouth daily. (Patient not taking: Reported on 03/28/2024), Disp: , Rfl:    furosemide  (LASIX ) 40 MG tablet, Take 40 mg by mouth daily., Disp: , Rfl:    glimepiride (AMARYL) 1 MG tablet, Take 1 mg by mouth daily with breakfast. (Patient not taking: Reported on 03/28/2024), Disp: , Rfl:    glimepiride (AMARYL) 2 MG tablet, Take 2 mg by mouth., Disp: , Rfl:    JANUVIA 50 MG tablet, Take 1 tablet by mouth daily., Disp: , Rfl:    metFORMIN  (GLUCOPHAGE ) 1000 MG tablet, Take 1,000 mg by mouth 2 (two) times daily. (Patient not taking: Reported on 03/28/2024), Disp: , Rfl:    naloxone  (NARCAN ) nasal spray 4 mg/0.1 mL, SPRAY 1 SPRAY INTO ONE NOSTRIL AS  DIRECTED FOR OPIOID OVERDOSE (TURN PERSON ON SIDE AFTER DOSE. IF NO RESPONSE IN 2-3 MINUTES OR PERSON RESPONDS BUT RELAPSES, REPEAT USING A NEW SPRAY DEVICE AND SPRAY INTO THE OTHER NOSTRIL. CALL 911 AFTER USE.) * EMERGENCY USE ONLY *, Disp: 56 each, Rfl: 0   nitrofurantoin , macrocrystal-monohydrate, (MACROBID ) 100 MG capsule, TAKE 1 CAPSULE BY MOUTH ONCE DAILY, Disp: 90 capsule, Rfl: 3   omeprazole  (PRILOSEC) 20 MG capsule, Take 1 capsule (20 mg total) by mouth daily., Disp: 30 capsule, Rfl: 3   ondansetron  (ZOFRAN ) 8 MG tablet, One pill every 8 hours as needed for nausea/vomitting., Disp: 60 tablet, Rfl: 1   oxyCODONE  (OXY IR/ROXICODONE ) 5 MG immediate release tablet, Take 1 tablet (5 mg total) by mouth every 8 (eight) hours as needed for severe pain (pain score 7-10)., Disp: 30 tablet, Rfl: 0   senna (SENOKOT) 8.6 MG TABS tablet, Take 1 tablet (8.6 mg total) by mouth daily as needed for mild constipation., Disp: 120 tablet, Rfl: 0   silodosin  (RAPAFLO ) 8 MG CAPS capsule, Take 1 capsule (8 mg total) by mouth daily with breakfast., Disp: 90 capsule, Rfl: 3   spironolactone  (ALDACTONE ) 25 MG tablet, Take 12.5 mg by mouth daily., Disp: , Rfl:    testosterone  (ANDROGEL ) 50 MG/5GM (1%) GEL, Place 5 g onto the skin daily. Please confirm with your cardiologist before resuming, Disp: , Rfl:    Zinc Gluconate 30 MG TABS, Take 30 mg by mouth daily. (Patient not taking: Reported on 03/28/2024), Disp: , Rfl:   Facility-Administered Medications Ordered in Other Encounters:    perflutren  lipid microspheres (DEFINITY ) IV suspension, 1-10 mL, Intravenous, PRN, Von Bellis, MD, 2 mL at 04/18/24 1157    ALLERGIES   Citalopram, Sulfamethoxazole -trimethoprim , and Trimethoprim    BP (!) 81/48   Pulse (!) 102   Temp (!) 97.4 F (36.3 C) (Oral)   Resp (!) 22   Ht 5' 11 (1.803 m)   Wt 84.8 kg   SpO2 100%   BMI 26.08 kg/m     Review of Systems:  Gen:  feels lousy HEENT: Denies blurred vision, double  vision, ear pain, eye pain, hearing loss, nose bleeds, sore throat Cardiac:  No dizziness, chest pain or heaviness, chest tightness,edema, No JVD Gi: + nausea Gu:  Denies bladder  incontinence, burning urine Ext:   Denies Joint pain, stiffness or swelling Skin: Denies  skin rash, easy bruising or bleeding or hives Endoc:  Denies polyuria, polydipsia , polyphagia or weight change Psych:   Denies depression, insomnia or hallucinations  Other:  All other systems negative  PHYSICAL EXAMINATION:  GENERAL:critically ill appearing EYES: Pupils equal, round, reactive to light.  No scleral icterus.  MOUTH: Moist mucosal membrane.  NECK: Supple.  PULMONARY: Lungs clear to auscultation CARDIOVASCULAR: S1 and S2.  Regular rate and rhythm GASTROINTESTINAL: Soft, nontender, -distended. Positive bowel sounds.  MUSCULOSKELETAL: No swelling, clubbing, or edema.  NEUROLOGIC: lethargic SKIN:normal, warm to touch, Capillary refill delayed  Pulses present bilaterally      IMAGING    CT Angio Chest PE W/Cm &/Or Wo Cm Result Date: 04/18/2024 CLINICAL DATA:  Pulmonary embolus suspected with high probability. Fever developed after biliary procedure today. EXAM: CT ANGIOGRAPHY CHEST CT ABDOMEN AND PELVIS WITH CONTRAST TECHNIQUE: Multidetector CT imaging of the chest was performed using the standard protocol during bolus administration of intravenous contrast. Multiplanar CT image reconstructions and MIPs were obtained to evaluate the vascular anatomy. Multidetector CT imaging of the abdomen and pelvis was performed using the standard protocol during bolus administration of intravenous contrast. RADIATION DOSE REDUCTION: This exam was performed according to the departmental dose-optimization program which includes automated exposure control, adjustment of the mA and/or kV according to patient size and/or use of iterative reconstruction technique. CONTRAST:  OMNIPAQUE  IOHEXOL  350 MG/ML SOLN COMPARISON:  PET  CT 04/12/2024.  CT chest 08/14/2020 FINDINGS: CTA CHEST FINDINGS Cardiovascular: Technically adequate study with good opacification of the central and segmental pulmonary arteries. Moderate motion artifact. No focal filling defects are demonstrated. No evidence of significant pulmonary embolus. Normal heart size. No pericardial effusions. Normal caliber thoracic aorta. No dissection. Great vessel origins are patent. Mediastinum/Nodes: No enlarged mediastinal, hilar, or axillary lymph nodes. Thyroid gland, trachea, and esophagus demonstrate no significant findings. Lungs/Pleura: Patchy peripheral infiltrates in the lungs may represent scarring or multifocal pneumonia. Similar appearance to previous study. No pleural effusion or pneumothorax. 7 mm diameter nodule in the right middle lung, series 3, image 70. Additional smaller nodule seen in both lungs. Musculoskeletal: Degenerative changes in the spine. No acute bony abnormalities. Review of the MIP images confirms the above findings. CT ABDOMEN and PELVIS FINDINGS Hepatobiliary: Residual contrast material demonstrated within mildly dilated intrahepatic bile ducts in the left lobe. A percutaneous biliary drainage catheter is present with tip in the duodenum. No focal liver lesions are seen. Extrahepatic bile ducts are not dilated. Gas in the gallbladder is likely result of procedure. Pancreas: Unremarkable. No pancreatic ductal dilatation or surrounding inflammatory changes. Spleen: Normal in size without focal abnormality. Adrenals/Urinary Tract: Adrenal glands are unremarkable. Kidneys are normal, without renal calculi, focal lesion, or hydronephrosis. Bladder is unremarkable. Stomach/Bowel: Stomach, small bowel, and colon are not abnormally distended. No wall thickening or inflammatory changes. Appendix is not identified. Vascular/Lymphatic: Aortic atherosclerosis. No enlarged abdominal or pelvic lymph nodes. Reproductive: Prostate gland is enlarged Other: No free  air or free fluid in the abdomen. No loculated collections are identified. Abdominal wall musculature appears intact. Musculoskeletal: Degenerative changes in the spine. No acute bony abnormalities. Review of the MIP images confirms the above findings. IMPRESSION: 1. Postprocedural changes in the liver with percutaneous biliary drain in place. Tip is in the duodenum. Residual contrast material within mildly dilated left intrahepatic bile ducts. Gas in the gallbladder is also likely postprocedural. 2. No abscess or  free air in the abdomen. 3. No evidence of significant pulmonary embolus. 4. Patchy peripheral infiltrates in the lungs possibly representing multifocal pneumonia or scarring. 5. Scattered nodules in the lungs, largest in the right middle lung measuring 7 mm diameter. Non-contrast chest CT at 3-6 months is recommended. If the nodules are stable at time of repeat CT, then future CT at 18-24 months (from today's scan) is considered optional for low-risk patients, but is recommended for high-risk patients. This recommendation follows the consensus statement: Guidelines for Management of Incidental Pulmonary Nodules Detected on CT Images: From the Fleischner Society 2017; Radiology 2017; 284:228-243. 6. Aortic atherosclerosis. 7. 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 Electronically Signed   By: Elsie Gravely M.D.   On: 04/18/2024 02:13   CT ABDOMEN PELVIS W CONTRAST Result Date: 04/18/2024 CLINICAL DATA:  Pulmonary embolus suspected with high probability. Fever developed after biliary procedure today. EXAM: CT ANGIOGRAPHY CHEST CT ABDOMEN AND PELVIS WITH CONTRAST TECHNIQUE: Multidetector CT imaging of the chest was performed using the standard protocol during bolus administration of intravenous contrast. Multiplanar CT image reconstructions and MIPs were obtained to evaluate the vascular anatomy. Multidetector CT imaging of the abdomen and pelvis was performed using the standard protocol during bolus administration  of intravenous contrast. RADIATION DOSE REDUCTION: This exam was performed according to the departmental dose-optimization program which includes automated exposure control, adjustment of the mA and/or kV according to patient size and/or use of iterative reconstruction technique. CONTRAST:  OMNIPAQUE  IOHEXOL  350 MG/ML SOLN COMPARISON:  PET CT 04/12/2024.  CT chest 08/14/2020 FINDINGS: CTA CHEST FINDINGS Cardiovascular: Technically adequate study with good opacification of the central and segmental pulmonary arteries. Moderate motion artifact. No focal filling defects are demonstrated. No evidence of significant pulmonary embolus. Normal heart size. No pericardial effusions. Normal caliber thoracic aorta. No dissection. Great vessel origins are patent. Mediastinum/Nodes: No enlarged mediastinal, hilar, or axillary lymph nodes. Thyroid gland, trachea, and esophagus demonstrate no significant findings. Lungs/Pleura: Patchy peripheral infiltrates in the lungs may represent scarring or multifocal pneumonia. Similar appearance to previous study. No pleural effusion or pneumothorax. 7 mm diameter nodule in the right middle lung, series 3, image 70. Additional smaller nodule seen in both lungs. Musculoskeletal: Degenerative changes in the spine. No acute bony abnormalities. Review of the MIP images confirms the above findings. CT ABDOMEN and PELVIS FINDINGS Hepatobiliary: Residual contrast material demonstrated within mildly dilated intrahepatic bile ducts in the left lobe. A percutaneous biliary drainage catheter is present with tip in the duodenum. No focal liver lesions are seen. Extrahepatic bile ducts are not dilated. Gas in the gallbladder is likely result of procedure. Pancreas: Unremarkable. No pancreatic ductal dilatation or surrounding inflammatory changes. Spleen: Normal in size without focal abnormality. Adrenals/Urinary Tract: Adrenal glands are unremarkable. Kidneys are normal, without renal calculi,  focal lesion, or hydronephrosis. Bladder is unremarkable. Stomach/Bowel: Stomach, small bowel, and colon are not abnormally distended. No wall thickening or inflammatory changes. Appendix is not identified. Vascular/Lymphatic: Aortic atherosclerosis. No enlarged abdominal or pelvic lymph nodes. Reproductive: Prostate gland is enlarged Other: No free air or free fluid in the abdomen. No loculated collections are identified. Abdominal wall musculature appears intact. Musculoskeletal: Degenerative changes in the spine. No acute bony abnormalities. Review of the MIP images confirms the above findings. IMPRESSION: 1. Postprocedural changes in the liver with percutaneous biliary drain in place. Tip is in the duodenum. Residual contrast material within mildly dilated left intrahepatic bile ducts. Gas in the gallbladder is also likely postprocedural. 2. No abscess or  free air in the abdomen. 3. No evidence of significant pulmonary embolus. 4. Patchy peripheral infiltrates in the lungs possibly representing multifocal pneumonia or scarring. 5. Scattered nodules in the lungs, largest in the right middle lung measuring 7 mm diameter. Non-contrast chest CT at 3-6 months is recommended. If the nodules are stable at time of repeat CT, then future CT at 18-24 months (from today's scan) is considered optional for low-risk patients, but is recommended for high-risk patients. This recommendation follows the consensus statement: Guidelines for Management of Incidental Pulmonary Nodules Detected on CT Images: From the Fleischner Society 2017; Radiology 2017; 284:228-243. 6. Aortic atherosclerosis. 7. 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 Electronically Signed   By: Elsie Gravely M.D.   On: 04/18/2024 02:13   DG C-Arm 1-60 Min-No Report Result Date: 04/17/2024 Fluoroscopy was utilized by the requesting physician.  No radiographic interpretation.   NM PET Image Initial (PI) Skull Base To Thigh (F-18 FDG) Addendum Date: 04/12/2024 ADDENDUM  REPORT: 04/12/2024 11:23 ADDENDUM: Additional impression. There is some asymmetric uptake in the area of the sella turcica/pituitary. Dedicated brain imaging could be performed with MRI as clinically appropriate to exclude lesion. Please correlate for any history Electronically Signed   By: Ranell Bring M.D.   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 EXAM: NUCLEAR MEDICINE PET SKULL BASE TO THIGH TECHNIQUE: 10.5 mCi F-18 FDG was injected intravenously. Full-ring PET imaging was performed from the skull base to thigh after the radiotracer. CT data was obtained and used for attenuation correction and anatomic localization. Fasting blood glucose: 108 mg/dl COMPARISON:  MRI abdomen 03/14/2024. CT abdomen pelvis 03/10/2024. CTA chest 08/14/2020. FINDINGS: Mediastinal blood pool activity: SUV max 2.5 Liver activity: SUV max 3.2 NECK: There is no specific abnormal uptake identified in the neck including along lymph node change of the submandibular, posterior triangle or internal jugular regions. Near symmetric uptake of the visualized intracranial compartment. However there is some uptake in the area of the sella. Please correlate for any known history. Dedicated MRI could be performed clinically appropriate. Incidental CT findings: Visualized paranasal sinuses and mastoid air cells are clear. Left sided concha bullosa. The parotid glands, submandibular glands and thyroid gland are unremarkable. Scattered vascular calcifications. CHEST: No specific abnormal radiotracer uptake identified above blood pool in the axillary regions, hilum or mediastinum. There is a small focus of uptake within the left upper lobe with maximum SUV of 2.3. This is seen on PET image 38. There is no CT correlate to a nodule in this location. Diameter of uptake approaches 6 mm. There is a nodule in the middle lobe however on CT image 54 measuring 5 mm which  does not show any abnormal uptake and was present on the CT of 2021 demonstrating long-term stability. Incidental CT findings: Breathing motion. There is some linear opacity lung bases likely scar or atelectasis. Some wispy ground-glass areas are identified which could be scarring or fibrotic changes based on the prior CT scan. Heart is enlarged. Scattered vascular calcifications are seen including along the coronary arteries. Patulous esophagus. ABDOMEN/PELVIS: There is a focal area of uptake which is somewhat linear in configuration with maximum SUV value of 8.0 centered at the hilum of the liver extending to the left hepatic lobe which is in the location of the stricture seen on the previous MRI and may correspond to the area of neoplasm. There is a more subtle area of uptake identified towards the ampulla measuring maximum  SUV of 6.2 but this is in the exact location of the stents. One being a right hepatic lobe PTC in the other endoscopic stent extending towards the left. This could be more artifactual as there is no clear mass lesion in this location on previous exam. Elsewhere there is physiologic distribution radiotracer along the parenchymal organs, bowel and renal collecting systems. No clear abnormal nodal uptake identified. Incidental CT findings: S dated placement of biliary stents. The biliary ductal dilatation in the right hepatic lobe is improved. The left hepatic lobe is also slightly improved with some residual. There is atrophy of the left hepatic lobe lateral segment. Air in the nondilated gallbladder. Mild global atrophy of the pancreas. The spleen, adrenal glands are grossly preserved on this noncontrast attenuation correction CT. Renal cysts are again identified as on prior MRI. No abnormal calcifications seen within either kidney nor along the course of either ureter. Underdistended urinary bladder. Heterogeneous enlarged prostate. Large bowel has a normal course and caliber with scattered  stool. Colonic diverticula identified. Stomach is underdistended. Small bowel is nondilated. Of note the cecum resides in the anterior right hemipelvis. Scattered vascular calcifications. Normal caliber aorta and IVC. Few small scattered nodes identified in the abdomen pelvis, not pathologic by size criteria. Small fat containing inguinal hernias. SKELETON: No abnormal uptake along the visualized osseous structures. Incidental CT findings: Scattered diffuse degenerative changes identified. Curvature of the spine. IMPRESSION: Focal area of uptake identified in the hilum of the liver at the location of strictures seen by previous imaging consistent with area of neoplasm. No other areas of abnormal uptake at this time suggest additional areas of disease. Indwelling right hepatic lobe PTC and endoscopic left-sided stent. Some improvement of the intrahepatic biliary ductal dilatation with residual particularly along the left hepatic lobe. Left hepatic lobe is also atrophic. There is a small focus of asymmetric uptake along the left upper lobe of the lung without a corresponding lung nodule on CT scan. In the setting recommend a dedicated high-resolution CT scan for further spacer resolution to see if there is an underlying lesion versus a follow up CT in 3 months. Residual areas of scarring and fibrotic changes in a similar distribution to the abnormality on prior remote CT scan of the chest. There is a right-sided lung nodule which is stable since 2021 has no abnormal uptake. No additional follow-up of this specific nodule. Electronically Signed: By: Ranell Bring M.D. On: 04/12/2024 10:38      ASSESSMENT/PLAN   78 year old white male seen today for assessment for ICU transfer indication for severe septic shock placed on vasopressors due to E. 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 coli species -use vasopressors to keep MAP>65 as needed -follow ABG and LA as needed -follow up cultures -emperic ABX -consider stress dose steroids -aggressive IV fluid Resuscitation Interventional radiology contacted GI contacted  INFECTIOUS DISEASE -continue antibiotics as prescribed -follow up cultures -follow up ID consultation    Family at bedside updated  Patient  satisfied with Plan of action and management. All questions answered   I Assessed the need for Labs I Assessed the need for Foley I Assessed the need for Central Venous Line Family Discussion when available I Assessed the need for Mobilization I made an Assessment of medications to be adjusted accordingly Safety Risk assessment completed  CASE DISCUSSED IN MULTIDISCIPLINARY ROUNDS WITH  ICU TEAM     Critical Care Time devoted to patient care services described in this note is 85 minutes.  Critical care was necessary to treat /prevent imminent and life-threatening deterioration. Overall, patient is critically ill, prognosis is guarded.  Patient with Multiorgan failure and at high risk for cardiac arrest and death.    Nickolas Alm Cellar, M.D.  [facility]  Medical Director [facility] Medical Director [facility] Cardio-Pulmonary Department

## 2024-04-18 NOTE — Progress Notes (Signed)
 [facility] Progress Note  Patient: Casey Peters    FMW:[id number]  DOA: 04/17/2024     Date of Service: the patient was seen and examined on 04/18/2024  Chief Complaint  Patient presents with   Post-op Problem   Brief hospital course: Casey Peters is a 78 y.o. male with medical history significant for dilated cardiomyopathy, CAD RCA stent in 2023, HTN, HLD, IIDM, BPH with history of recurrent UTIs on suppressive antibiotics, recently hospitalized with painless jaundice, s/p ERCP  with biliary stent placement in 03/15/24, exchanged yesterday 04/17/24, with brushings sent to cytology who is being admitted for sepsis secondary to multifocal pneumonia.  History is given by wife at the bedside who states that he has been weak and staying in bed since his hospitalization a month ago when he had the first stent placed, however yesterday several hours after his procedure he developed a cough and a fever which prompted the visit to the ED.  She states he has had nausea but without vomiting and has ongoing abdominal pain for over a month which has not changed.  Patient appears very frail, lethargic and unable to contribute to history. In the ED tachycardic to the 140s to 150s and tachypneic to the 30s.  BP initially 107/63 but improving to the 160s following hydration.  O2 sat initially in the 90s on room air but desatted down to as low as 62 requiring 5 L O2 via nasal cannula to maintain sats in the high 90s. Labs notable for WBC 18,000 with lactic acid 1.3-->2.3.  Creatinine 1.45 up from baseline of 0.76 a month ago.  LFTs reassuring compared to prior.  BNP normal.  EKG with sinus tachycardia at 147 CT abdomen and pelvis showing postsurgical changes with drains in place and no acute findings.  And CTA PE protocol negative for PE but showing patchy peripheral infiltrates possibly representing multifocal pneumonia. Patient given sepsis fluid bolus and started on Zosyn  and vancomycin  for possible  intra-abdominal source. Admission requested    Assessment and Plan:  # Septic shock secondary to E. coli bacteremia and HCAP S/p 3 L fluid bolus given Continue maintenance IV fluid MAP was below 65, started Levophed  Monitor for fluid overload in view of history of HFrEF Critical care team was consulted, patient will be transferred to ICU for close monitoring Continue to biotics cefepime , Flagyl  and vancomycin  Pharmacy consulted for dosing Blood culture growing E. Coli Follow ID consult   # Acute respiratory failure with hypoxia # HCAP (healthcare-associated pneumonia) Sepsis criteria include fever, tachycardia and tachypnea with leukocytosis and lactic acidosis with multifocal pneumonia Possible aspiration during sedation for procedure given timing of onset of symptoms Patient was hypoxic to 62% and tachypneic to the 30s requiring 5 L to maintain sats in the high 90s Continue cefepime  vancomycin  and Flagyl  Sepsis fluids Protonix SLP consult Patient is at high risk of clinical deterioration      # AKI (acute kidney injury) due to septic shock Expecting improvement with IV fluid resuscitation Monitor renal function and avoid nephrotoxins  # Hypokalemia, potassium repleted. # Hypomagnesemia, mag repleted. # Hypophosphatemia, Phos repleted. Monitor electrolytes and replete as needed.  # Biliary stenosis S/p ERCP 03/15/2024 and 04/17/2024, cytology pending No acute procedural complications suspected CT abdomen and pelvis nonacute GI consulted to follow     # STEMI involving right coronary artery [facility]) S/p cath with stent to RCA. Has residual post cath pain and is on nitroglycerin  infusion Post-cath orders per Dr. Lonni End Continues  on carvedilol , rosuvastatin .  As needed labetalol  and hydralazine  for SBP over 150.  On Aggrastat  Troponin 89 possible demand ischemia Continue to trend troponin F/u Repeat TTE    # HFrEF (heart failure with reduced ejection fraction)  (HCC) Dilated idiopathic cardiomyopathy (HCC) Hold carvedilol , spironolactone  in the setting of sepsis Daily weights   # Controlled type 2 diabetes mellitus without complication  Sliding scale insulin  coverage   # Pulmonary nodule, incidental finding. CT chest: Scattered nodules in the lungs, largest in the right middle lung measuring 7 mm diameter. Non-contrast chest CT at 3-6 months is recommended. If the nodules are stable at time of repeat CT, then future CT at 18-24 months (from today's scan) is considered optional for low-risk patients, but is recommended for high-risk patients. Recommended to follow with PCP and pulmonary as an outpatient.  Body mass index is 26.08 kg/m.  Interventions:  Diet: CLD advance as per tolerance DVT Prophylaxis: Subcutaneous Heparin     Advance goals of care discussion: DNR/DNI-limited  Family Communication: family was present at bedside, at the time of interview.  The pt provided permission to discuss medical plan with the family. Opportunity was given to ask question and all questions were answered satisfactorily.   Disposition:  Pt is from Home, admitted with sepsis, resp failure, PNA, still on IV antibiotics, transferred to ICU, which precludes a safe discharge. Discharge to Home, when stable, may need few days to improve.  Subjective: Patient was admitted overnight with sepsis, complaining of mild shortness of breath, denies any abdominal pain, no palpitations or chest pain.   Physical Exam: General: NAD, lying comfortably Appear in no distress, affect appropriate Eyes: PERRLA ENT: Oral Mucosa Clear, moist  Neck: no JVD,  Cardiovascular: S1 and S2 Present, no Murmur,  Respiratory: Equal air entry bilaterally, mild bibasilar crackles, no wheezes  Abdomen: BS present, Soft and TD, biliary drain intact Skin: no rashes Extremities: no Pedal edema, no calf tenderness Neurologic: without any new focal findings Gait not checked due to patient  safety concerns  Vitals:   04/18/24 0700 04/18/24 0730 04/18/24 0800 04/18/24 0830  BP: (!) 56/35 (!) 73/48 (!) 78/50 (!) 78/35  Pulse: (!) 103 100 (!) 101 (!) 102  Resp: 19 19 (!) 23 (!) 21  Temp:      TempSrc:      SpO2: 98% 99% 100% 100%  Weight:      Height:       No intake or output data in the 24 hours ending 04/18/24 0855 Filed Weights   04/17/24 2325  Weight: 84.8 kg    Data Reviewed: I have personally reviewed and interpreted daily labs, tele strips, imagings as discussed above. I reviewed all nursing notes, pharmacy notes, vitals, pertinent old records I have discussed plan of care as described above with RN and patient/family.  CBC: Recent Labs  Lab 04/17/24 2330  WBC 18.7*  NEUTROABS 15.8*  HGB 13.2  HCT 38.5*  MCV 94.4  PLT 196   Basic Metabolic Panel: Recent Labs  Lab 04/17/24 1133 04/17/24 2330 04/18/24 0522  NA 138 136  --   K 4.3 4.0  --   CL 106 106  --   CO2 22 18*  --   GLUCOSE 163* 192*  --   BUN 33* 34*  --   CREATININE 1.24 1.45* 1.44*  CALCIUM  9.0 8.8*  --     Studies: CT Angio Chest PE W/Cm &/Or Wo Cm Result Date: 04/18/2024 CLINICAL DATA:  Pulmonary embolus suspected  with high probability. Fever developed after biliary procedure today. EXAM: CT ANGIOGRAPHY CHEST CT ABDOMEN AND PELVIS WITH CONTRAST TECHNIQUE: Multidetector CT imaging of the chest was performed using the standard protocol during bolus administration of intravenous contrast. Multiplanar CT image reconstructions and MIPs were obtained to evaluate the vascular anatomy. Multidetector CT imaging of the abdomen and pelvis was performed using the standard protocol during bolus administration of intravenous contrast. RADIATION DOSE REDUCTION: This exam was performed according to the departmental dose-optimization program which includes automated exposure control, adjustment of the mA and/or kV according to patient size and/or use of iterative reconstruction technique. CONTRAST:   OMNIPAQUE  IOHEXOL  350 MG/ML SOLN COMPARISON:  PET CT 04/12/2024.  CT chest 08/14/2020 FINDINGS: CTA CHEST FINDINGS Cardiovascular: Technically adequate study with good opacification of the central and segmental pulmonary arteries. Moderate motion artifact. No focal filling defects are demonstrated. No evidence of significant pulmonary embolus. Normal heart size. No pericardial effusions. Normal caliber thoracic aorta. No dissection. Great vessel origins are patent. Mediastinum/Nodes: No enlarged mediastinal, hilar, or axillary lymph nodes. Thyroid gland, trachea, and esophagus demonstrate no significant findings. Lungs/Pleura: Patchy peripheral infiltrates in the lungs may represent scarring or multifocal pneumonia. Similar appearance to previous study. No pleural effusion or pneumothorax. 7 mm diameter nodule in the right middle lung, series 3, image 70. Additional smaller nodule seen in both lungs. Musculoskeletal: Degenerative changes in the spine. No acute bony abnormalities. Review of the MIP images confirms the above findings. CT ABDOMEN and PELVIS FINDINGS Hepatobiliary: Residual contrast material demonstrated within mildly dilated intrahepatic bile ducts in the left lobe. A percutaneous biliary drainage catheter is present with tip in the duodenum. No focal liver lesions are seen. Extrahepatic bile ducts are not dilated. Gas in the gallbladder is likely result of procedure. Pancreas: Unremarkable. No pancreatic ductal dilatation or surrounding inflammatory changes. Spleen: Normal in size without focal abnormality. Adrenals/Urinary Tract: Adrenal glands are unremarkable. Kidneys are normal, without renal calculi, focal lesion, or hydronephrosis. Bladder is unremarkable. Stomach/Bowel: Stomach, small bowel, and colon are not abnormally distended. No wall thickening or inflammatory changes. Appendix is not identified. Vascular/Lymphatic: Aortic atherosclerosis. No enlarged abdominal or pelvic lymph nodes.  Reproductive: Prostate gland is enlarged Other: No free air or free fluid in the abdomen. No loculated collections are identified. Abdominal wall musculature appears intact. Musculoskeletal: Degenerative changes in the spine. No acute bony abnormalities. Review of the MIP images confirms the above findings. IMPRESSION: 1. Postprocedural changes in the liver with percutaneous biliary drain in place. Tip is in the duodenum. Residual contrast material within mildly dilated left intrahepatic bile ducts. Gas in the gallbladder is also likely postprocedural. 2. No abscess or free air in the abdomen. 3. No evidence of significant pulmonary embolus. 4. Patchy peripheral infiltrates in the lungs possibly representing multifocal pneumonia or scarring. 5. Scattered nodules in the lungs, largest in the right middle lung measuring 7 mm diameter. Non-contrast chest CT at 3-6 months is recommended. If the nodules are stable at time of repeat CT, then future CT at 18-24 months (from today's scan) is considered optional for low-risk patients, but is recommended for high-risk patients. This recommendation follows the consensus statement: Guidelines for Management of Incidental Pulmonary Nodules Detected on CT Images: From the Fleischner Society 2017; Radiology 2017; 284:228-243. 6. Aortic atherosclerosis. 7. 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 Electronically Signed   By: Elsie Gravely M.D.   On: 04/18/2024 02:13   CT ABDOMEN PELVIS W CONTRAST Result Date: 04/18/2024 CLINICAL DATA:  Pulmonary embolus suspected  with high probability. Fever developed after biliary procedure today. EXAM: CT ANGIOGRAPHY CHEST CT ABDOMEN AND PELVIS WITH CONTRAST TECHNIQUE: Multidetector CT imaging of the chest was performed using the standard protocol during bolus administration of intravenous contrast. Multiplanar CT image reconstructions and MIPs were obtained to evaluate the vascular anatomy. Multidetector CT imaging of the abdomen and pelvis was performed  using the standard protocol during bolus administration of intravenous contrast. RADIATION DOSE REDUCTION: This exam was performed according to the departmental dose-optimization program which includes automated exposure control, adjustment of the mA and/or kV according to patient size and/or use of iterative reconstruction technique. CONTRAST:  OMNIPAQUE  IOHEXOL  350 MG/ML SOLN COMPARISON:  PET CT 04/12/2024.  CT chest 08/14/2020 FINDINGS: CTA CHEST FINDINGS Cardiovascular: Technically adequate study with good opacification of the central and segmental pulmonary arteries. Moderate motion artifact. No focal filling defects are demonstrated. No evidence of significant pulmonary embolus. Normal heart size. No pericardial effusions. Normal caliber thoracic aorta. No dissection. Great vessel origins are patent. Mediastinum/Nodes: No enlarged mediastinal, hilar, or axillary lymph nodes. Thyroid gland, trachea, and esophagus demonstrate no significant findings. Lungs/Pleura: Patchy peripheral infiltrates in the lungs may represent scarring or multifocal pneumonia. Similar appearance to previous study. No pleural effusion or pneumothorax. 7 mm diameter nodule in the right middle lung, series 3, image 70. Additional smaller nodule seen in both lungs. Musculoskeletal: Degenerative changes in the spine. No acute bony abnormalities. Review of the MIP images confirms the above findings. CT ABDOMEN and PELVIS FINDINGS Hepatobiliary: Residual contrast material demonstrated within mildly dilated intrahepatic bile ducts in the left lobe. A percutaneous biliary drainage catheter is present with tip in the duodenum. No focal liver lesions are seen. Extrahepatic bile ducts are not dilated. Gas in the gallbladder is likely result of procedure. Pancreas: Unremarkable. No pancreatic ductal dilatation or surrounding inflammatory changes. Spleen: Normal in size without focal abnormality. Adrenals/Urinary Tract: Adrenal glands are  unremarkable. Kidneys are normal, without renal calculi, focal lesion, or hydronephrosis. Bladder is unremarkable. Stomach/Bowel: Stomach, small bowel, and colon are not abnormally distended. No wall thickening or inflammatory changes. Appendix is not identified. Vascular/Lymphatic: Aortic atherosclerosis. No enlarged abdominal or pelvic lymph nodes. Reproductive: Prostate gland is enlarged Other: No free air or free fluid in the abdomen. No loculated collections are identified. Abdominal wall musculature appears intact. Musculoskeletal: Degenerative changes in the spine. No acute bony abnormalities. Review of the MIP images confirms the above findings. IMPRESSION: 1. Postprocedural changes in the liver with percutaneous biliary drain in place. Tip is in the duodenum. Residual contrast material within mildly dilated left intrahepatic bile ducts. Gas in the gallbladder is also likely postprocedural. 2. No abscess or free air in the abdomen. 3. No evidence of significant pulmonary embolus. 4. Patchy peripheral infiltrates in the lungs possibly representing multifocal pneumonia or scarring. 5. Scattered nodules in the lungs, largest in the right middle lung measuring 7 mm diameter. Non-contrast chest CT at 3-6 months is recommended. If the nodules are stable at time of repeat CT, then future CT at 18-24 months (from today's scan) is considered optional for low-risk patients, but is recommended for high-risk patients. This recommendation follows the consensus statement: Guidelines for Management of Incidental Pulmonary Nodules Detected on CT Images: From the Fleischner Society 2017; Radiology 2017; 284:228-243. 6. Aortic atherosclerosis. 7. 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 Electronically Signed   By: Elsie Gravely M.D.   On: 04/18/2024 02:13   DG C-Arm 1-60 Min-No Report Result Date: 04/17/2024 Fluoroscopy was utilized by the requesting  physician.  No radiographic interpretation.    Scheduled Meds:  enoxaparin   (LOVENOX ) injection  40 mg Subcutaneous Q24H   insulin  aspart  0-9 Units Subcutaneous Q4H   Continuous Infusions:  ceFEPime  (MAXIPIME ) IV     lactated ringers  999 mL/hr (04/18/24 0540)   metronidazole  Stopped (04/18/24 0728)   sodium chloride      [START ON 04/19/2024] vancomycin      PRN Meds: acetaminophen  **OR** acetaminophen , albuterol , ondansetron  **OR** ondansetron  (ZOFRAN ) IV  Time spent: 55 minutes  Author: ELVAN SOR. MD [facility] 04/18/2024 8:55 AM  To reach On-call, see care teams to locate the attending and reach out to them via [email or web address]. If 7PM-7AM, please contact night-coverage If you still have difficulty reaching the attending provider, please page the [facility] (Director on Call) for [facility] on amion for assistance.

## 2024-04-18 NOTE — Progress Notes (Signed)
 PHARMACY - PHYSICIAN COMMUNICATION CRITICAL VALUE ALERT - BLOOD CULTURE IDENTIFICATION (BCID)  Casey Peters is an 78 y.o. male who presented to [facility] on [facility]/17/2024 with a chief complaint of fever and chills after ERCP earlier in day 7/7  Assessment:  7/7 blood cultures with GNR, BCID detects E coli (no resistance genes).  Source may be GI or lung.  CT without significant findings for GI source.    Name of physician (or Provider) Contacted: Dr Von  Current antibiotics: Vanco/cefepime /metronidazole   Changes to prescribed antibiotics recommended:  Defering antibiotics to ID   Results for orders placed or performed during the hospital encounter of 04/17/24  Blood Culture ID Panel (Reflexed) (Collected: 04/17/2024 11:29 PM)  Result Value Ref Range   Enterococcus faecalis NOT DETECTED NOT DETECTED   Enterococcus Faecium NOT DETECTED NOT DETECTED   Listeria monocytogenes NOT DETECTED NOT DETECTED   Staphylococcus species NOT DETECTED NOT DETECTED   Staphylococcus aureus (BCID) NOT DETECTED NOT DETECTED   Staphylococcus epidermidis NOT DETECTED NOT DETECTED   Staphylococcus lugdunensis NOT DETECTED NOT DETECTED   Streptococcus species NOT DETECTED NOT DETECTED   Streptococcus agalactiae NOT DETECTED NOT DETECTED   Streptococcus pneumoniae NOT DETECTED NOT DETECTED   Streptococcus pyogenes NOT DETECTED NOT DETECTED   A.calcoaceticus-baumannii NOT DETECTED NOT DETECTED   Bacteroides fragilis NOT DETECTED NOT DETECTED   Enterobacterales DETECTED (A) NOT DETECTED   Enterobacter cloacae complex NOT DETECTED NOT DETECTED   Escherichia coli DETECTED (A) NOT DETECTED   Klebsiella aerogenes NOT DETECTED NOT DETECTED   Klebsiella oxytoca NOT DETECTED NOT DETECTED   Klebsiella pneumoniae NOT DETECTED NOT DETECTED   Proteus species NOT DETECTED NOT DETECTED   Salmonella species NOT DETECTED NOT DETECTED   Serratia marcescens NOT DETECTED NOT DETECTED   Haemophilus influenzae NOT  DETECTED NOT DETECTED   Neisseria meningitidis NOT DETECTED NOT DETECTED   Pseudomonas aeruginosa NOT DETECTED NOT DETECTED   Stenotrophomonas maltophilia NOT DETECTED NOT DETECTED   Candida albicans NOT DETECTED NOT DETECTED   Candida auris NOT DETECTED NOT DETECTED   Candida glabrata NOT DETECTED NOT DETECTED   Candida krusei NOT DETECTED NOT DETECTED   Candida parapsilosis NOT DETECTED NOT DETECTED   Candida tropicalis NOT DETECTED NOT DETECTED   Cryptococcus neoformans/gattii NOT DETECTED NOT DETECTED   CTX-M ESBL NOT DETECTED NOT DETECTED   Carbapenem resistance IMP NOT DETECTED NOT DETECTED   Carbapenem resistance KPC NOT DETECTED NOT DETECTED   Carbapenem resistance NDM NOT DETECTED NOT DETECTED   Carbapenem resist OXA 48 LIKE NOT DETECTED NOT DETECTED   Carbapenem resistance VIM NOT DETECTED NOT DETECTED    Celestine Slovak, PharmD, BCPS, BCIDP Work Cell: [phone] 04/18/2024 10:25 AM

## 2024-04-18 NOTE — ED Notes (Signed)
 Pt was placed in trendelenburg position however, pt refused and wanted to sit up. Pt wife educated on the importance however, both requested sit up.

## 2024-04-18 NOTE — ED Notes (Signed)
 Pt suddenly became tachypneic, tachycardic, and hypoxic. PT placed on Vandiver @ 6lpm.

## 2024-04-18 NOTE — Progress Notes (Signed)
 Pharmacy Antibiotic Note  Casey Peters is a 78 y.o. male admitted on 04/17/2024 with infection of unknown origin.  Pharmacy has been consulted for Vancomycin  dosing for 7 days.  Plan: Pt given Vancomycin  2000 mg once. Vancomycin  1250 mg IV Q 24 hrs. Goal AUC 400-550. Expected AUC: 479.3 SCr used: 1.45  Pharmacy will continue to follow and will adjust abx dosing whenever warranted.  Temp (24hrs), Avg:98.1 F (36.7 C), Min:97 F (36.1 C), Max:99.4 F (37.4 C)   Recent Labs  Lab 04/17/24 1133 04/17/24 2329 04/17/24 2330 04/18/24 0245  WBC  --   --  18.7*  --   CREATININE 1.24  --  1.45*  --   LATICACIDVEN  --  1.3  --  2.3*    Estimated Creatinine Clearance: 46.2 mL/min (A) (by C-G formula based on SCr of 1.45 mg/dL (H)).    Allergies  Allergen Reactions   Citalopram Nausea And Vomiting    Dizziness, unsteady, balanced issues   Sulfamethoxazole -Trimethoprim      Fatigue and muscle ache Other reaction(s): Other (See Comments) Fatigue and muscle ache   Trimethoprim  Other (See Comments)    Fatigue, hypotension    Antimicrobials this admission: 7/08 Zosyn  >> x 1 dose 7/08 Vancomycin  >> x 7 days 7/08 Cefepime  >> x 7 days 7/08 Flagyl  >> x 7 days  Microbiology results: 7/08 BCx: Pending  Thank you for allowing pharmacy to be a part of this patient's care.  Rankin CANDIE Dills, PharmD, [facility] 04/18/2024 5:16 AM

## 2024-04-18 NOTE — Assessment & Plan Note (Addendum)
 Severe sepsis Acute respiratory failure with hypoxia Sepsis criteria include fever, tachycardia and tachypnea with leukocytosis and lactic acidosis with multifocal pneumonia Possible aspiration during sedation for procedure given timing of onset of symptoms Patient was hypoxic to 62% and tachypneic to the 30s requiring 5 L to maintain sats in the high 90s Will treat with cefepime  vancomycin  and Flagyl  Sepsis fluids Protonix Keep n.p.o. SLP consult While source is likely respiratory, can consider GI consult given recent procedure Monitor for fluid overload in view of history of HFrEF Patient is at high risk of clinical deterioration Addendum:: Following admission, patient becomes hypotensive in spite of IV fluid resuscitation.  IV fluid bolus ordered.  Discussed with wife further who elects to place patient DNR/DNI

## 2024-04-18 NOTE — ED Notes (Addendum)
 Pt reporting to ED today d/t fever that was developed post biliary procedure today. Pt came home and was doing well but then began to get warm and weak. Pt spiked a fever and spouse gave half dose of extra strength Tylenol  which showed some improvement before going back up. Pt temp now 98.70f orally. Pt connected to cardiac leads and pulse oximetry. Pt denies CP or dyspnea. Area around biliary drain normal. Pt denies needs at this time. Pt ABCs intact. RR even and unlabored on [place] @ 2lpm. Pt in NAD. Bed in lowest locked position. Call bell in reach.   Past Medical History:  Diagnosis Date   Cardiomegaly    CHF (congestive heart failure) (HCC)    Constipation    Diabetic polyneuropathy (HCC)    Dilated idiopathic cardiomyopathy (HCC)    Erectile dysfunction    HLD (hyperlipidemia)    HTN (hypertension)    Low testosterone     Palpitations    Pneumonia    T2DM (type 2 diabetes mellitus) (HCC)

## 2024-04-18 NOTE — H&P (Addendum)
 History and Physical    Patient: Casey Peters FMW:[id number] DOB: 01-Dec-1945 DOA: 04/17/2024 DOS: the patient was seen and examined on 04/18/2024 PCP: Rudolpho Norleen BIRCH, MD  Patient coming from: Home  Chief Complaint:  Chief Complaint  Patient presents with   Post-op Problem    HPI: Casey Peters is a 78 y.o. male with medical history significant for dilated cardiomyopathy, CAD RCA stent in 2023, HTN, HLD, IIDM, BPH with history of recurrent UTIs on suppressive antibiotics, recently hospitalized with painless jaundice, s/p ERCP  with biliary stent placement in 03/15/24, exchanged yesterday 04/17/24, with brushings sent to cytology who is being admitted for sepsis secondary to multifocal pneumonia.  History is given by wife at the bedside who states that he has been weak and staying in bed since his hospitalization a month ago when he had the first stent placed, however yesterday several hours after his procedure he developed a cough and a fever which prompted the visit to the ED.  She states he has had nausea but without vomiting and has ongoing abdominal pain for over a month which has not changed.  Patient appears very frail, lethargic and unable to contribute to history. In the ED tachycardic to the 140s to 150s and tachypneic to the 30s.  BP initially 107/63 but improving to the 160s following hydration.  O2 sat initially in the 90s on room air but desatted down to as low as 62 requiring 5 L O2 via nasal cannula to maintain sats in the high 90s. Labs notable for WBC 18,000 with lactic acid 1.3-->2.3.  Creatinine 1.45 up from baseline of 0.76 a month ago.  LFTs reassuring compared to prior.  BNP normal.  EKG with sinus tachycardia at 147 CT abdomen and pelvis showing postsurgical changes with drains in place and no acute findings.  And CTA PE protocol negative for PE but showing patchy peripheral infiltrates possibly representing multifocal pneumonia. Patient given sepsis fluid bolus and  started on Zosyn  and vancomycin  for possible intra-abdominal source. Admission requested    Past Medical History:  Diagnosis Date   Cardiomegaly    CHF (congestive heart failure) (HCC)    Constipation    Diabetic polyneuropathy (HCC)    Dilated idiopathic cardiomyopathy (HCC)    Erectile dysfunction    HLD (hyperlipidemia)    HTN (hypertension)    Low testosterone     Palpitations    Pneumonia    T2DM (type 2 diabetes mellitus) (HCC)    Past Surgical History:  Procedure Laterality Date   CORONARY/GRAFT ACUTE MI REVASCULARIZATION N/A 12/27/2021   Procedure: Coronary/Graft Acute MI Revascularization;  Surgeon: Mady Bruckner, MD;  Location: [facility];  Service: Cardiovascular;  Laterality: N/A;   CYSTOSCOPY WITH URETHRAL DILATATION N/A 06/17/2021   Procedure: CYSTOSCOPY WITH URETHRAL DILATATION;  Surgeon: Twylla Glendia BROCKS, MD;  Location: [facility];  Service: Urology;  Laterality: N/A;  Optilume Balloon   ERCP N/A 03/15/2024   Procedure: ERCP, WITH INTERVENTION IF INDICATED;  Surgeon: Jinny Carmine, MD;  Location: [facility];  Service: Endoscopy;  Laterality: N/A;   ERCP N/A 04/17/2024   Procedure: ERCP, WITH INTERVENTION IF INDICATED;  Surgeon: Jinny Carmine, MD;  Location: [facility];  Service: Endoscopy;  Laterality: N/A;   HERNIA REPAIR  1998   IR BILIARY DRAIN PLACEMENT WITH CHOLANGIOGRAM  03/16/2024   LEFT HEART CATH AND CORONARY ANGIOGRAPHY N/A 12/27/2021   Procedure: LEFT HEART CATH AND CORONARY ANGIOGRAPHY;  Surgeon: Mady Bruckner, MD;  Location: [facility];  Service: Cardiovascular;  Laterality: N/A;   PILONIDAL CYST EXCISION     TONSILLECTOMY     URETHRA SURGERY  1995   Social History:  reports that he has been smoking cigars. He has never used smokeless tobacco. He reports that he does not drink alcohol and does not use drugs.  Allergies  Allergen Reactions   Citalopram Nausea And Vomiting    Dizziness, unsteady, balanced issues    Sulfamethoxazole -Trimethoprim      Fatigue and muscle ache Other reaction(s): Other (See Comments) Fatigue and muscle ache   Trimethoprim  Other (See Comments)    Fatigue, hypotension    Family History  Problem Relation Age of Onset   Hematuria Father    Kidney disease Neg Hx    Prostate cancer Neg Hx     Prior to Admission medications   Medication Sig Start Date End Date Taking? Authorizing Provider  ascorbic acid (VITAMIN C) 500 MG tablet Take 500 mg by mouth daily. Patient not taking: Reported on 03/28/2024    [provider]  aspirin  EC 81 MG tablet Take 81 mg by mouth daily.    [provider]  carvedilol  (COREG ) 6.25 MG tablet Take 6.25 mg by mouth 2 (two) times daily with a meal.    [provider]  cholecalciferol (VITAMIN D3) 25 MCG (1000 UNIT) tablet Take 1,000 Units by mouth daily. Patient not taking: Reported on 03/28/2024    [provider]  citalopram (CELEXA) 20 MG tablet Take 20 mg by mouth daily. Patient not taking: Reported on 03/28/2024 02/03/24   [provider]  furosemide  (LASIX ) 40 MG tablet Take 40 mg by mouth daily.    [provider]  glimepiride (AMARYL) 1 MG tablet Take 1 mg by mouth daily with breakfast. Patient not taking: Reported on 03/28/2024 03/03/24 03/03/25  [provider]  glimepiride (AMARYL) 2 MG tablet Take 2 mg by mouth. 03/21/24 03/21/25  [provider]  JANUVIA 50 MG tablet Take 1 tablet by mouth daily. 02/29/24 02/28/25  [provider]  metFORMIN  (GLUCOPHAGE ) 1000 MG tablet Take 1,000 mg by mouth 2 (two) times daily. Patient not taking: Reported on 03/28/2024 11/30/23   [provider]  naloxone  (NARCAN ) nasal spray 4 mg/0.1 mL SPRAY 1 SPRAY INTO ONE NOSTRIL AS DIRECTED FOR OPIOID OVERDOSE (TURN PERSON ON SIDE AFTER DOSE. IF NO RESPONSE IN 2-3 MINUTES OR PERSON RESPONDS BUT RELAPSES, REPEAT USING A NEW SPRAY DEVICE AND SPRAY INTO THE OTHER NOSTRIL. CALL 911 AFTER  USE.) * EMERGENCY USE ONLY * 04/05/24   Borders, Fonda SAUNDERS, NP  nitrofurantoin , macrocrystal-monohydrate, (MACROBID ) 100 MG capsule TAKE 1 CAPSULE BY MOUTH ONCE DAILY 11/18/23   Vaillancourt, Samantha, PA-C  omeprazole  (PRILOSEC) 20 MG capsule Take 1 capsule (20 mg total) by mouth daily. 04/05/24   Borders, Fonda SAUNDERS, NP  ondansetron  (ZOFRAN ) 8 MG tablet One pill every 8 hours as needed for nausea/vomitting. 03/28/24   Brahmanday, Govinda R, MD  oxyCODONE  (OXY IR/ROXICODONE ) 5 MG immediate release tablet Take 1 tablet (5 mg total) by mouth every 8 (eight) hours as needed for severe pain (pain score 7-10). 04/05/24   Borders, Fonda SAUNDERS, NP  senna (SENOKOT) 8.6 MG TABS tablet Take 1 tablet (8.6 mg total) by mouth daily as needed for mild constipation. 04/05/24   Borders, Fonda SAUNDERS, NP  silodosin  (RAPAFLO ) 8 MG CAPS capsule Take 1 capsule (8 mg total) by mouth daily with breakfast. 12/10/23   Vaillancourt, Lucie, PA-C  spironolactone  (ALDACTONE ) 25 MG tablet Take  12.5 mg by mouth daily. 01/04/24   [provider]  testosterone  (ANDROGEL ) 50 MG/5GM (1%) GEL Place 5 g onto the skin daily. Please confirm with your cardiologist before resuming 12/29/21   Amin, Sumayya, MD  Zinc Gluconate 30 MG TABS Take 30 mg by mouth daily. Patient not taking: Reported on 03/28/2024    [provider]    Physical Exam: Vitals:   04/18/24 0131 04/18/24 0200 04/18/24 0215 04/18/24 0225  BP: (!) 144/120 (!) 160/69    Pulse: (!) 151  (!) 117 (!) 112  Resp: (!) 32 19  (!) 27  Temp: 98.9 F (37.2 C)     TempSrc: Oral     SpO2: 97%   99%  Weight:      Height:       Physical Exam Vitals and nursing note reviewed.  Constitutional:      Comments: Frail-appearing, lethargic, tachycardia and tachypneic.  Will open eyes when name is called  HENT:     Head: Normocephalic and atraumatic.  Cardiovascular:     Rate and Rhythm: Regular rhythm. Tachycardia present.     Heart sounds: Normal heart sounds.  Pulmonary:      Effort: Tachypnea present.     Breath sounds: Normal breath sounds.     Comments: Coarse breath Sounds Abdominal:     Palpations: Abdomen is soft.     Tenderness: There is no abdominal tenderness.  Neurological:     General: No focal deficit present.     Labs on Admission: I have personally reviewed following labs and imaging studies  CBC: Recent Labs  Lab 04/17/24 2330  WBC 18.7*  NEUTROABS 15.8*  HGB 13.2  HCT 38.5*  MCV 94.4  PLT 196   Basic Metabolic Panel: Recent Labs  Lab 04/17/24 1133 04/17/24 2330  NA 138 136  K 4.3 4.0  CL 106 106  CO2 22 18*  GLUCOSE 163* 192*  BUN 33* 34*  CREATININE 1.24 1.45*  CALCIUM  9.0 8.8*   GFR: Estimated Creatinine Clearance: 46.2 mL/min (A) (by C-G formula based on SCr of 1.45 mg/dL (H)). Liver Function Tests: Recent Labs  Lab 04/17/24 1133 04/17/24 2330  AST 37 33  ALT 88* 85*  ALKPHOS 120 114  BILITOT 1.7* 1.8*  PROT 7.4 7.8  ALBUMIN 3.1* 3.2*   No results for input(s): LIPASE, AMYLASE in the last 168 hours. No results for input(s): AMMONIA in the last 168 hours. Coagulation Profile: No results for input(s): INR, PROTIME in the last 168 hours. Cardiac Enzymes: No results for input(s): CKTOTAL, CKMB, CKMBINDEX, TROPONINI in the last 168 hours. BNP (last 3 results) No results for input(s): PROBNP in the last 8760 hours. HbA1C: No results for input(s): HGBA1C in the last 72 hours. CBG: Recent Labs  Lab 04/12/24 0821 04/17/24 1030  GLUCAP 108* 131*   Lipid Profile: No results for input(s): CHOL, HDL, LDLCALC, TRIG, CHOLHDL, LDLDIRECT in the last 72 hours. Thyroid Function Tests: No results for input(s): TSH, T4TOTAL, FREET4, T3FREE, THYROIDAB in the last 72 hours. Anemia Panel: No results for input(s): VITAMINB12, FOLATE, FERRITIN, TIBC, IRON, RETICCTPCT in the last 72 hours. Urine analysis:    Component Value Date/Time   COLORURINE AMBER (A)  03/14/2024 1713   APPEARANCEUR HAZY (A) 03/14/2024 1713   APPEARANCEUR Clear 11/08/2023 1116   LABSPEC 1.020 03/14/2024 1713   PHURINE 5.0 03/14/2024 1713   GLUCOSEU NEGATIVE 03/14/2024 1713   HGBUR MODERATE (A) 03/14/2024 1713   BILIRUBINUR MODERATE (A) 03/14/2024 1713  BILIRUBINUR Negative 11/08/2023 1116   KETONESUR NEGATIVE 03/14/2024 1713   PROTEINUR NEGATIVE 03/14/2024 1713   UROBILINOGEN 0.2 09/17/2023 1505   NITRITE NEGATIVE 03/14/2024 1713   LEUKOCYTESUR LARGE (A) 03/14/2024 1713    Radiological Exams on Admission: CT Angio Chest PE W/Cm &/Or Wo Cm Result Date: 04/18/2024 CLINICAL DATA:  Pulmonary embolus suspected with high probability. Fever developed after biliary procedure today. EXAM: CT ANGIOGRAPHY CHEST CT ABDOMEN AND PELVIS WITH CONTRAST TECHNIQUE: Multidetector CT imaging of the chest was performed using the standard protocol during bolus administration of intravenous contrast. Multiplanar CT image reconstructions and MIPs were obtained to evaluate the vascular anatomy. Multidetector CT imaging of the abdomen and pelvis was performed using the standard protocol during bolus administration of intravenous contrast. RADIATION DOSE REDUCTION: This exam was performed according to the departmental dose-optimization program which includes automated exposure control, adjustment of the mA and/or kV according to patient size and/or use of iterative reconstruction technique. CONTRAST:  OMNIPAQUE  IOHEXOL  350 MG/ML SOLN COMPARISON:  PET CT 04/12/2024.  CT chest 08/14/2020 FINDINGS: CTA CHEST FINDINGS Cardiovascular: Technically adequate study with good opacification of the central and segmental pulmonary arteries. Moderate motion artifact. No focal filling defects are demonstrated. No evidence of significant pulmonary embolus. Normal heart size. No pericardial effusions. Normal caliber thoracic aorta. No dissection. Great vessel origins are patent. Mediastinum/Nodes: No enlarged  mediastinal, hilar, or axillary lymph nodes. Thyroid gland, trachea, and esophagus demonstrate no significant findings. Lungs/Pleura: Patchy peripheral infiltrates in the lungs may represent scarring or multifocal pneumonia. Similar appearance to previous study. No pleural effusion or pneumothorax. 7 mm diameter nodule in the right middle lung, series 3, image 70. Additional smaller nodule seen in both lungs. Musculoskeletal: Degenerative changes in the spine. No acute bony abnormalities. Review of the MIP images confirms the above findings. CT ABDOMEN and PELVIS FINDINGS Hepatobiliary: Residual contrast material demonstrated within mildly dilated intrahepatic bile ducts in the left lobe. A percutaneous biliary drainage catheter is present with tip in the duodenum. No focal liver lesions are seen. Extrahepatic bile ducts are not dilated. Gas in the gallbladder is likely result of procedure. Pancreas: Unremarkable. No pancreatic ductal dilatation or surrounding inflammatory changes. Spleen: Normal in size without focal abnormality. Adrenals/Urinary Tract: Adrenal glands are unremarkable. Kidneys are normal, without renal calculi, focal lesion, or hydronephrosis. Bladder is unremarkable. Stomach/Bowel: Stomach, small bowel, and colon are not abnormally distended. No wall thickening or inflammatory changes. Appendix is not identified. Vascular/Lymphatic: Aortic atherosclerosis. No enlarged abdominal or pelvic lymph nodes. Reproductive: Prostate gland is enlarged Other: No free air or free fluid in the abdomen. No loculated collections are identified. Abdominal wall musculature appears intact. Musculoskeletal: Degenerative changes in the spine. No acute bony abnormalities. Review of the MIP images confirms the above findings. IMPRESSION: 1. Postprocedural changes in the liver with percutaneous biliary drain in place. Tip is in the duodenum. Residual contrast material within mildly dilated left intrahepatic bile ducts.  Gas in the gallbladder is also likely postprocedural. 2. No abscess or free air in the abdomen. 3. No evidence of significant pulmonary embolus. 4. Patchy peripheral infiltrates in the lungs possibly representing multifocal pneumonia or scarring. 5. Scattered nodules in the lungs, largest in the right middle lung measuring 7 mm diameter. Non-contrast chest CT at 3-6 months is recommended. If the nodules are stable at time of repeat CT, then future CT at 18-24 months (from today's scan) is considered optional for low-risk patients, but is recommended for high-risk patients. This recommendation follows the  consensus statement: Guidelines for Management of Incidental Pulmonary Nodules Detected on CT Images: From the Fleischner Society 2017; Radiology 2017; 284:228-243. 6. Aortic atherosclerosis. 7. 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 Electronically Signed   By: Elsie Gravely M.D.   On: 04/18/2024 02:13   CT ABDOMEN PELVIS W CONTRAST Result Date: 04/18/2024 CLINICAL DATA:  Pulmonary embolus suspected with high probability. Fever developed after biliary procedure today. EXAM: CT ANGIOGRAPHY CHEST CT ABDOMEN AND PELVIS WITH CONTRAST TECHNIQUE: Multidetector CT imaging of the chest was performed using the standard protocol during bolus administration of intravenous contrast. Multiplanar CT image reconstructions and MIPs were obtained to evaluate the vascular anatomy. Multidetector CT imaging of the abdomen and pelvis was performed using the standard protocol during bolus administration of intravenous contrast. RADIATION DOSE REDUCTION: This exam was performed according to the departmental dose-optimization program which includes automated exposure control, adjustment of the mA and/or kV according to patient size and/or use of iterative reconstruction technique. CONTRAST:  OMNIPAQUE  IOHEXOL  350 MG/ML SOLN COMPARISON:  PET CT 04/12/2024.  CT chest 08/14/2020 FINDINGS: CTA CHEST FINDINGS Cardiovascular: Technically  adequate study with good opacification of the central and segmental pulmonary arteries. Moderate motion artifact. No focal filling defects are demonstrated. No evidence of significant pulmonary embolus. Normal heart size. No pericardial effusions. Normal caliber thoracic aorta. No dissection. Great vessel origins are patent. Mediastinum/Nodes: No enlarged mediastinal, hilar, or axillary lymph nodes. Thyroid gland, trachea, and esophagus demonstrate no significant findings. Lungs/Pleura: Patchy peripheral infiltrates in the lungs may represent scarring or multifocal pneumonia. Similar appearance to previous study. No pleural effusion or pneumothorax. 7 mm diameter nodule in the right middle lung, series 3, image 70. Additional smaller nodule seen in both lungs. Musculoskeletal: Degenerative changes in the spine. No acute bony abnormalities. Review of the MIP images confirms the above findings. CT ABDOMEN and PELVIS FINDINGS Hepatobiliary: Residual contrast material demonstrated within mildly dilated intrahepatic bile ducts in the left lobe. A percutaneous biliary drainage catheter is present with tip in the duodenum. No focal liver lesions are seen. Extrahepatic bile ducts are not dilated. Gas in the gallbladder is likely result of procedure. Pancreas: Unremarkable. No pancreatic ductal dilatation or surrounding inflammatory changes. Spleen: Normal in size without focal abnormality. Adrenals/Urinary Tract: Adrenal glands are unremarkable. Kidneys are normal, without renal calculi, focal lesion, or hydronephrosis. Bladder is unremarkable. Stomach/Bowel: Stomach, small bowel, and colon are not abnormally distended. No wall thickening or inflammatory changes. Appendix is not identified. Vascular/Lymphatic: Aortic atherosclerosis. No enlarged abdominal or pelvic lymph nodes. Reproductive: Prostate gland is enlarged Other: No free air or free fluid in the abdomen. No loculated collections are identified. Abdominal wall  musculature appears intact. Musculoskeletal: Degenerative changes in the spine. No acute bony abnormalities. Review of the MIP images confirms the above findings. IMPRESSION: 1. Postprocedural changes in the liver with percutaneous biliary drain in place. Tip is in the duodenum. Residual contrast material within mildly dilated left intrahepatic bile ducts. Gas in the gallbladder is also likely postprocedural. 2. No abscess or free air in the abdomen. 3. No evidence of significant pulmonary embolus. 4. Patchy peripheral infiltrates in the lungs possibly representing multifocal pneumonia or scarring. 5. Scattered nodules in the lungs, largest in the right middle lung measuring 7 mm diameter. Non-contrast chest CT at 3-6 months is recommended. If the nodules are stable at time of repeat CT, then future CT at 18-24 months (from today's scan) is considered optional for low-risk patients, but is recommended for high-risk patients. This recommendation follows the  consensus statement: Guidelines for Management of Incidental Pulmonary Nodules Detected on CT Images: From the Fleischner Society 2017; Radiology 2017; 284:228-243. 6. Aortic atherosclerosis. 7. 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 Electronically Signed   By: Elsie Gravely M.D.   On: 04/18/2024 02:13   DG C-Arm 1-60 Min-No Report Result Date: 04/17/2024 Fluoroscopy was utilized by the requesting physician.  No radiographic interpretation.   Data Reviewed for HPI: Relevant notes from primary care and specialist visits, past discharge summaries as available in EHR, including Care Everywhere. Prior diagnostic testing as pertinent to current admission diagnoses Updated medications and problem lists for reconciliation ED course, including vitals, labs, imaging, treatment and response to treatment Triage notes, nursing and pharmacy notes and ED provider's notes Notable results as noted above in HPI      Assessment and Plan: * HCAP (healthcare-associated  pneumonia) Severe sepsis Acute respiratory failure with hypoxia Sepsis criteria include fever, tachycardia and tachypnea with leukocytosis and lactic acidosis with multifocal pneumonia Possible aspiration during sedation for procedure given timing of onset of symptoms Patient was hypoxic to 62% and tachypneic to the 30s requiring 5 L to maintain sats in the high 90s Will treat with cefepime  vancomycin  and Flagyl  Sepsis fluids Protonix Keep n.p.o. SLP consult While source is likely respiratory, can consider GI consult given recent procedure Monitor for fluid overload in view of history of HFrEF Patient is at high risk of clinical deterioration Addendum:: Following admission, patient becomes hypotensive in spite of IV fluid resuscitation.  IV fluid bolus ordered.  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 No acute issues suspected    HFrEF (heart failure with reduced ejection fraction) (HCC) Dilated idiopathic cardiomyopathy (HCC) Hold carvedilol , spironolactone  in the setting of sepsis Daily weights   Controlled type 2 diabetes mellitus without complication (HCC) Sliding scale insulin  coverage     DVT prophylaxis: Lovenox   Consults: none  Advance Care Planning:   Code Status: Limited: Do not attempt resuscitation (DNR) -DNR-LIMITED -Do Not Intubate/DNI    Family Communication: Wife at bedside  Disposition Plan: Back to previous home environment  Severity of Illness: The appropriate patient status for this patient is INPATIENT. Inpatient status is judged to be  reasonable and necessary in order to provide the required intensity of service to ensure the patient's safety. The patient's presenting symptoms, physical exam findings, and initial radiographic and laboratory data in the context of their chronic comorbidities is felt to place them at high risk for further clinical deterioration. Furthermore, it is not anticipated that the patient will be medically stable for discharge from the hospital within 2 midnights of admission.   * I certify that at the point of admission it is my clinical judgment that the patient will require inpatient hospital care spanning beyond 2 midnights from the point of admission due to high intensity of service, high risk for further deterioration and high frequency of surveillance required.* CRITICAL CARE Performed by: Delayne LULLA Solian   Total critical care time: 75 minutes  Critical care time was exclusive of separately billable procedures and treating other patients.  Critical care was necessary to treat or prevent imminent or life-threatening deterioration.  Critical care was time spent personally by me on the following activities: development of treatment plan with patient and/or surrogate as well as nursing, discussions with consultants, evaluation of patient's response to treatment, examination of patient, obtaining history from patient or surrogate, ordering and performing treatments and interventions, ordering and review of laboratory studies, ordering and review of radiographic studies, pulse oximetry and re-evaluation of patient's condition.  Author: Delayne LULLA Solian, MD 04/18/2024 4:35 AM  For on call review [email or web address].

## 2024-04-19 DIAGNOSIS — B9620 Unspecified Escherichia coli [E. coli] as the cause of diseases classified elsewhere: Secondary | ICD-10-CM | POA: Diagnosis not present

## 2024-04-19 DIAGNOSIS — R7881 Bacteremia: Secondary | ICD-10-CM | POA: Diagnosis not present

## 2024-04-19 DIAGNOSIS — A419 Sepsis, unspecified organism: Secondary | ICD-10-CM | POA: Diagnosis not present

## 2024-04-19 DIAGNOSIS — J189 Pneumonia, unspecified organism: Secondary | ICD-10-CM | POA: Diagnosis not present

## 2024-04-19 DIAGNOSIS — K769 Liver disease, unspecified: Secondary | ICD-10-CM

## 2024-04-19 DIAGNOSIS — N179 Acute kidney failure, unspecified: Secondary | ICD-10-CM | POA: Diagnosis not present

## 2024-04-19 DIAGNOSIS — R6520 Severe sepsis without septic shock: Secondary | ICD-10-CM | POA: Diagnosis not present

## 2024-04-19 DIAGNOSIS — K831 Obstruction of bile duct: Secondary | ICD-10-CM | POA: Diagnosis not present

## 2024-04-19 LAB — CBC
HCT: 32.8 % — ABNORMAL LOW (ref 39.0–52.0)
Hemoglobin: 11.1 g/dL — ABNORMAL LOW (ref 13.0–17.0)
MCH: 32 pg (ref 26.0–34.0)
MCHC: 33.8 g/dL (ref 30.0–36.0)
MCV: 94.5 fL (ref 80.0–100.0)
Platelets: 153 K/uL (ref 150–400)
RBC: 3.47 MIL/uL — ABNORMAL LOW (ref 4.22–5.81)
RDW: 16.3 % — ABNORMAL HIGH (ref 11.5–15.5)
WBC: 17.2 K/uL — ABNORMAL HIGH (ref 4.0–10.5)
nRBC: 0 % (ref 0.0–0.2)

## 2024-04-19 LAB — HEPATIC FUNCTION PANEL
ALT: 57 U/L — ABNORMAL HIGH (ref 0–44)
AST: 23 U/L (ref 15–41)
Albumin: 2.3 g/dL — ABNORMAL LOW (ref 3.5–5.0)
Alkaline Phosphatase: 74 U/L (ref 38–126)
Bilirubin, Direct: 0.8 mg/dL — ABNORMAL HIGH (ref 0.0–0.2)
Indirect Bilirubin: 0.9 mg/dL (ref 0.3–0.9)
Total Bilirubin: 1.7 mg/dL — ABNORMAL HIGH (ref 0.0–1.2)
Total Protein: 5.8 g/dL — ABNORMAL LOW (ref 6.5–8.1)

## 2024-04-19 LAB — BASIC METABOLIC PANEL WITH GFR
Anion gap: 8 (ref 5–15)
BUN: 24 mg/dL — ABNORMAL HIGH (ref 8–23)
CO2: 19 mmol/L — ABNORMAL LOW (ref 22–32)
Calcium: 8 mg/dL — ABNORMAL LOW (ref 8.9–10.3)
Chloride: 113 mmol/L — ABNORMAL HIGH (ref 98–111)
Creatinine, Ser: 1.2 mg/dL (ref 0.61–1.24)
GFR, Estimated: >60 mL/min (ref >60)
Glucose, Bld: 101 mg/dL — ABNORMAL HIGH (ref 70–99)
Potassium: 3.9 mmol/L (ref 3.5–5.1)
Sodium: 140 mmol/L (ref 135–145)

## 2024-04-19 LAB — TROPONIN I (HIGH SENSITIVITY): Troponin I (High Sensitivity): 63 ng/L — ABNORMAL HIGH (ref <18)

## 2024-04-19 LAB — GLUCOSE, CAPILLARY
Glucose-Capillary: 95 mg/dL (ref 70–99)
Glucose-Capillary: 94 mg/dL (ref 70–99)
Glucose-Capillary: 151 mg/dL — ABNORMAL HIGH (ref 70–99)
Glucose-Capillary: 134 mg/dL — ABNORMAL HIGH (ref 70–99)
Glucose-Capillary: 136 mg/dL — ABNORMAL HIGH (ref 70–99)
Glucose-Capillary: 119 mg/dL — ABNORMAL HIGH (ref 70–99)

## 2024-04-19 LAB — MAGNESIUM: Magnesium: 1.8 mg/dL (ref 1.7–2.4)

## 2024-04-19 LAB — SURGICAL PATHOLOGY

## 2024-04-19 LAB — PHOSPHORUS: Phosphorus: 2.6 mg/dL (ref 2.5–4.6)

## 2024-04-19 MED ORDER — VITAMIN D (ERGOCALCIFEROL) 1.25 MG (50000 UNIT) PO CAPS
50000.0000 [IU] | ORAL_CAPSULE | ORAL | Status: DC
  Administered 2024-04-19: 50000 [IU] via ORAL
  Filled 2024-04-19: qty 1

## 2024-04-19 MED ORDER — SODIUM BICARBONATE 8.4 % IV SOLN
100.0000 meq | Freq: Once | INTRAVENOUS | Status: AC
  Administered 2024-04-19: 100 meq via INTRAVENOUS
  Filled 2024-04-19: qty 50

## 2024-04-19 MED ORDER — FUROSEMIDE 10 MG/ML IJ SOLN
40.0000 mg | Freq: Once | INTRAMUSCULAR | Status: AC
  Administered 2024-04-19: 40 mg via INTRAVENOUS
  Filled 2024-04-19: qty 4

## 2024-04-19 NOTE — Plan of Care (Signed)
  Problem: Education: Goal: Ability to describe self-care measures that may prevent or decrease complications (Diabetes Survival Skills Education) will improve Outcome: Progressing   Problem: Coping: Goal: Ability to adjust to condition or change in health will improve Outcome: Progressing   Problem: Fluid Volume: Goal: Ability to maintain a balanced intake and output will improve Outcome: Progressing   Problem: Health Behavior/Discharge Planning: Goal: Ability to identify and utilize available resources and services will improve Outcome: Progressing Goal: Ability to manage health-related needs will improve Outcome: Progressing   Problem: Nutritional: Goal: Maintenance of adequate nutrition will improve Outcome: Progressing   Problem: Skin Integrity: Goal: Risk for impaired skin integrity will decrease Outcome: Progressing   Problem: Tissue Perfusion: Goal: Adequacy of tissue perfusion will improve Outcome: Progressing   Problem: Fluid Volume: Goal: Hemodynamic stability will improve Outcome: Progressing   Problem: Clinical Measurements: Goal: Signs and symptoms of infection will decrease Outcome: Progressing   Problem: Respiratory: Goal: Ability to maintain adequate ventilation will improve Outcome: Progressing   Problem: Education: Goal: Knowledge of General Education information will improve Description: Including pain rating scale, medication(s)/side effects and non-pharmacologic comfort measures Outcome: Progressing   Problem: Health Behavior/Discharge Planning: Goal: Ability to manage health-related needs will improve Outcome: Progressing   Problem: Clinical Measurements: Goal: Ability to maintain clinical measurements within normal limits will improve Outcome: Progressing   Problem: Coping: Goal: Level of anxiety will decrease Outcome: Progressing   Problem: Safety: Goal: Ability to remain free from injury will improve Outcome: Progressing

## 2024-04-19 NOTE — Plan of Care (Signed)
   Problem: Coping: Goal: Ability to adjust to condition or change in health will improve Outcome: Progressing   Problem: Fluid Volume: Goal: Ability to maintain a balanced intake and output will improve Outcome: Progressing   Problem: Metabolic: Goal: Ability to maintain appropriate glucose levels will improve Outcome: Progressing

## 2024-04-19 NOTE — Evaluation (Signed)
 Physical Therapy Evaluation Patient Details Name: Casey Peters MRN: [id number] DOB: 1946-04-28 Today's Date: 04/19/2024  History of Present Illness  Casey Peters is a 78 y.o. male with medical history significant for dilated cardiomyopathy, CAD RCA stent in 2023, HTN, HLD, IIDM, BPH with history of recurrent UTIs on suppressive antibiotics, recently hospitalized with painless jaundice, s/p ERCP  with biliary stent placement in 03/15/24, exchanged yesterday 04/17/24, with brushings sent to cytology who is being admitted for sepsis secondary to multifocal pneumonia.  Clinical Impression  Pt is a pleasant 78 year old male who was admitted for septic shock after stent placement. Pt performs bed mobility with mod a, requiring multimodal cueing to initiate and perform mobility. When sitting, pt demo heavy right lateral lean requiring physical assistance from PT to maintain midline posture alongside multimodal cueing. Unable to perform STS transfers and ambulation d/t pt fatigue. Pt demonstrates deficits with activity tolerance, strength, and balance limiting his capability of taking care of himself and returning home. Pt would benefit from skilled PT interventions to meet therapy goals and improve QoL. PT to follow acutely as appropriate.          If plan is discharge home, recommend the following: A lot of help with walking and/or transfers;A lot of help with bathing/dressing/bathroom;Assistance with cooking/housework;Assist for transportation;Help with stairs or ramp for entrance   Can travel by private vehicle   No    Equipment Recommendations Wheelchair (measurements PT);Other (comment) (pt's wife requesting WC for d/c to home)  Recommendations for Other Services       Functional Status Assessment Patient has had a recent decline in their functional status and demonstrates the ability to make significant improvements in function in a reasonable and predictable amount of time.      Precautions / Restrictions Precautions Precautions: Fall Recall of Precautions/Restrictions: Intact Restrictions Weight Bearing Restrictions Per Provider Order: No      Mobility  Bed Mobility Overal bed mobility: Needs Assistance Bed Mobility: Supine to Sit, Sit to Supine     Supine to sit: Mod assist Sit to supine: Mod assist   General bed mobility comments: mod a for supine<>sit, pt requiring multimodal cueing throughout mobility, heavy right lateral lean requiring physical asssistance from PT to maintain midline posture    Transfers                   General transfer comment: not appropriate this date    Ambulation/Gait               General Gait Details: not appropriate this date  Stairs            Wheelchair Mobility     Tilt Bed    Modified Rankin (Stroke Patients Only)       Balance Overall balance assessment: Needs assistance Sitting-balance support: Bilateral upper extremity supported, Feet supported Sitting balance-Leahy Scale: Poor Sitting balance - Comments: sitting EOB Postural control: Right lateral lean     Standing balance comment: not appropriate this date                             Pertinent Vitals/Pain Pain Assessment Pain Assessment: No/denies pain    Home Living Family/patient expects to be discharged to:: Private residence Living Arrangements: Spouse/significant other Available Help at Discharge: Family Type of Home: House Home Access: Level entry       Home Layout: One level Home Equipment: Agricultural consultant (2 wheels);Toilet  riser Additional Comments: lives with wife, daughter staying with them for a couple of days after d/c    Prior Function Prior Level of Function : Independent/Modified Independent             Mobility Comments: does not use RW but has it avaiable to him ADLs Comments: ind with ADLs     Extremity/Trunk Assessment   Upper Extremity Assessment Upper Extremity  Assessment: Generalized weakness    Lower Extremity Assessment Lower Extremity Assessment: Generalized weakness    Cervical / Trunk Assessment Cervical / Trunk Assessment: Kyphotic  Communication   Communication Communication: Impaired Factors Affecting Communication: Difficulty expressing self;Reduced clarity of speech    Cognition Arousal: Alert Behavior During Therapy: Flat affect   PT - Cognitive impairments: No apparent impairments                         Following commands: Intact       Cueing Cueing Techniques: Verbal cues, Tactile cues     General Comments      Exercises     Assessment/Plan    PT Assessment Patient needs continued PT services  PT Problem List Decreased strength;Decreased range of motion;Decreased activity tolerance;Decreased balance;Decreased mobility;Decreased coordination;Decreased knowledge of use of DME;Decreased safety awareness       PT Treatment Interventions DME instruction;Gait training;Functional mobility training;Therapeutic activities;Therapeutic exercise;Balance training;Patient/family education    PT Goals (Current goals can be found in the Care Plan section)  Acute Rehab PT Goals Patient Stated Goal: to feel better and return home PT Goal Formulation: With patient/family Time For Goal Achievement: 05/03/24 Potential to Achieve Goals: Good    Frequency Min 2X/week     Co-evaluation               AM-PAC PT 6 Clicks Mobility  Outcome Measure Help needed turning from your back to your side while in a flat bed without using bedrails?: A Little Help needed moving from lying on your back to sitting on the side of a flat bed without using bedrails?: A Lot Help needed moving to and from a bed to a chair (including a wheelchair)?: A Lot Help needed standing up from a chair using your arms (e.g., wheelchair or bedside chair)?: A Lot Help needed to walk in hospital room?: A Lot   6 Click Score: 11    End  of Session   Activity Tolerance: Patient limited by fatigue Patient left: in bed;with call bell/phone within reach;with bed alarm set;with family/visitor present Nurse Communication: Mobility status PT Visit Diagnosis: Unsteadiness on feet (R26.81);Muscle weakness (generalized) (M62.81);History of falling (Z91.81);Other abnormalities of gait and mobility (R26.89)    Time: 8642-8584 PT Time Calculation (min) (ACUTE ONLY): 18 min   Charges:                  Tamari Redwine Romero-Perozo, SPT  04/19/2024, 3:13 PM

## 2024-04-19 NOTE — Progress Notes (Signed)
 Date of Admission:  04/17/2024    ID: Casey Peters is a 78 y.o. male  Principal Problem:   HCAP (healthcare-associated pneumonia) Active Problems:   Biliary stenosis   Severe sepsis (HCC)   Acute respiratory failure with hypoxia (HCC)   AKI (acute kidney injury) (HCC)   Septic shock (HCC)  Wife at bed side  Subjective: Pain abdomen much improved Had a good breakfast  Medications:   Chlorhexidine  Gluconate Cloth  6 each Topical Daily   guaiFENesin   600 mg Oral BID   heparin  injection (subcutaneous)  5,000 Units Subcutaneous Q8H   insulin  aspart  0-9 Units Subcutaneous Q4H   tamsulosin   0.4 mg Oral QPC supper   Vitamin D  (Ergocalciferol )  50,000 Units Oral Q7 days    Objective: Vital signs in last 24 hours: Patient Vitals for the past 24 hrs:  BP Temp Temp src Pulse Resp SpO2 Weight  04/19/24 1000 103/64 -- -- 86 20 100 % --  04/19/24 0900 108/62 -- -- 91 16 100 % --  04/19/24 0800 (!) 103/58 98.2 F (36.8 C) Oral 81 16 100 % --  04/19/24 0733 103/60 -- -- 84 20 100 % --  04/19/24 0700 (!) 86/54 -- -- 83 -- 99 % --  04/19/24 0600 (!) 91/58 -- -- 76 -- 100 % --  04/19/24 0510 -- -- -- -- -- -- 90.8 kg  04/19/24 0505 (!) 100/57 -- -- 85 (!) 21 100 % --  04/19/24 0400 (!) 89/64 -- -- 75 (!) 25 100 % --  04/19/24 0300 (!) 104/58 -- -- 79 (!) 25 100 % --  04/19/24 0207 (!) 87/55 -- -- 85 (!) 24 100 % --  04/19/24 0200 (!) 84/38 -- -- 82 (!) 21 100 % --  04/19/24 0100 (!) 94/57 -- -- 84 (!) 33 100 % --  04/19/24 0030 102/62 -- -- 87 (!) 26 100 % --  04/19/24 0000 (!) 90/41 -- -- 83 (!) 21 100 % --  04/18/24 2300 (!) 97/57 -- -- 82 (!) 22 100 % --  04/18/24 2200 95/63 -- -- 80 (!) 25 100 % --  04/18/24 2100 98/67 -- -- 86 (!) 25 100 % --  04/18/24 2000 107/70 -- -- 87 20 100 % --  04/18/24 1950 -- 97.7 F (36.5 C) Oral -- -- -- --  04/18/24 1900 103/68 -- -- 86 (!) 24 100 % --  04/18/24 1800 104/68 -- -- 88 (!) 26 100 % --  04/18/24 1700 131/68 -- -- 90 (!) 26  100 % --  04/18/24 1630 113/84 -- -- 92 (!) 29 100 % --  04/18/24 1600 122/75 -- -- 92 (!) 26 100 % --  04/18/24 1530 119/73 -- -- 93 (!) 22 100 % --  04/18/24 1515 -- -- -- 94 (!) 23 100 % --  04/18/24 1500 113/70 -- -- 92 (!) 22 100 % --  04/18/24 1445 -- -- -- 93 (!) 24 100 % --  04/18/24 1430 116/70 -- -- 91 (!) 24 100 % --  04/18/24 1415 -- -- -- 92 (!) 21 100 % --  04/18/24 1400 116/68 -- -- 94 (!) 22 100 % --  04/18/24 1351 -- 97.7 F (36.5 C) Oral -- -- -- --  04/18/24 1345 -- -- -- -- (!) 22 -- --  04/18/24 1330 -- -- -- -- (!) 22 -- --  04/18/24 1315 -- -- -- -- (!) 22 -- --  04/18/24 1300 109/72 -- -- -- Casey Peters  26 -- --  04/18/24 1245 -- -- -- -- (!) 21 -- --  04/18/24 1230 (!) 83/51 -- -- -- 19 -- --  04/18/24 1215 -- -- -- -- 18 -- --  04/18/24 1200 (!) 81/48 -- -- -- (!) 22 -- --  04/18/24 1145 -- -- -- -- 20 -- --  04/18/24 1130 (!) 95/55 -- -- -- 18 -- --  04/18/24 1030 (!) 85/51 -- -- -- 20 -- --      PHYSICAL EXAM:  General: sleeping- awakens easily Head: Normocephalic, without obvious abnormality, atraumatic. Eyes: Conjunctivae clear, anicteric sclerae. Pupils are equal ENT Nares normal. No drainage or sinus tenderness. Lips, mucosa, and tongue normal. No Thrush Neck: Supple, symmetrical, no adenopathy, thyroid: non tender no carotid bruit and no JVD. Back: No CVA tenderness. Lungs: Clear to auscultation bilaterally. No Wheezing or Rhonchi. No rales. Heart: Regular rate and rhythm, no murmur, rub or gallop. Abdomen: Soft, rt perc biliary drain Extremities: atraumatic, no cyanosis. No edema. No clubbing Skin: No rashes or lesions. Or bruising Lymph: Cervical, supraclavicular normal. Neurologic: Grossly non-focal  Lab Results    Latest Ref Rng & Units 04/19/2024    5:00 AM 04/18/2024    9:05 AM 04/17/2024   11:30 PM  CBC  WBC 4.0 - 10.5 K/uL 17.2  17.1  18.7   Hemoglobin 13.0 - 17.0 g/dL 88.8  89.0  86.7   Hematocrit 39.0 - 52.0 % 32.8  32.4  38.5    Platelets 150 - 400 K/uL 153  157  196        Latest Ref Rng & Units 04/19/2024    5:00 AM 04/18/2024    6:21 PM 04/18/2024    9:05 AM  CMP  Glucose 70 - 99 mg/dL 898  803  870   BUN 8 - 23 mg/dL 24  25  33   Creatinine 0.61 - 1.24 mg/dL 8.79  8.77  8.31   Sodium 135 - 145 mmol/L 140  139  140   Potassium 3.5 - 5.1 mmol/L 3.9  4.2  2.8   Chloride 98 - 111 mmol/L 113  114  112   CO2 22 - 32 mmol/L 19  16  20    Calcium  8.9 - 10.3 mg/dL 8.0  7.8  7.8   Total Protein 6.5 - 8.1 g/dL 5.8   5.3   Total Bilirubin 0.0 - 1.2 mg/dL 1.7   2.3   Alkaline Phos 38 - 126 U/L 74   84   AST 15 - 41 U/L 23   27   ALT 0 - 44 U/L 57   59       Microbiology:  Studies/Results: ECHOCARDIOGRAM COMPLETE Result Date: 04/18/2024    ECHOCARDIOGRAM REPORT   Patient Name:   Casey Peters Date of Exam: 04/18/2024 Medical Rec #:  [id number]            Height:       71.0 in Accession #:    [id number]           Weight:       187.0 lb Date of Birth:  1945/10/21            BSA:          2.049 m Patient Age:    78 years             BP:           86/53 mmHg Patient Gender: M  HR:           99 bpm. Exam Location:  [facility] Procedure: 2D Echo, Color Doppler, Cardiac Doppler and Intracardiac            Opacification Agent (Both Spectral and Color Flow Doppler were            utilized during procedure). Indications:     CHF-Acute Systolic I50.21  History:         Patient has prior history of Echocardiogram examinations, most                  recent 12/28/2021. CHF.  Sonographer:     Ashley McNeely-Sloane Referring Phys:  [id number] ELVAN SOR Diagnosing Phys: Dwayne D Callwood MD IMPRESSIONS  1. Left ventricular ejection fraction, by estimation, is 50 to 55%. The left ventricle has low normal function. The left ventricle has no regional wall motion abnormalities. Left ventricular diastolic parameters are consistent with Grade I diastolic dysfunction (impaired relaxation).  2. Right ventricular systolic function is  normal. The right ventricular size is normal.  3. The mitral valve is normal in structure. Trivial mitral valve regurgitation.  4. The aortic valve is normal in structure. Aortic valve regurgitation is not visualized. FINDINGS  Left Ventricle: Left ventricular ejection fraction, by estimation, is 50 to 55%. The left ventricle has low normal function. The left ventricle has no regional wall motion abnormalities. Definity  contrast agent was given IV to delineate the left ventricular endocardial borders. Strain was performed and the global longitudinal strain is indeterminate. Global longitudinal strain performed but not reported based on interpreter judgement due to suboptimal tracking. The left ventricular internal cavity size was normal in size. There is borderline left ventricular hypertrophy. Left ventricular diastolic parameters are consistent with Grade I diastolic dysfunction (impaired relaxation). Right Ventricle: The right ventricular size is normal. No increase in right ventricular wall thickness. Right ventricular systolic function is normal. Left Atrium: Left atrial size was normal in size. Right Atrium: Right atrial size was normal in size. Pericardium: There is no evidence of pericardial effusion. Mitral Valve: The mitral valve is normal in structure. Trivial mitral valve regurgitation. MV peak gradient, 3.1 mmHg. The mean mitral valve gradient is 2.0 mmHg. Tricuspid Valve: The tricuspid valve is normal in structure. Tricuspid valve regurgitation is mild. Aortic Valve: The aortic valve is normal in structure. Aortic valve regurgitation is not visualized. Aortic valve mean gradient measures 2.0 mmHg. Aortic valve peak gradient measures 3.4 mmHg. Aortic valve area, by VTI measures 4.02 cm. Pulmonic Valve: The pulmonic valve was normal in structure. Pulmonic valve regurgitation is not visualized. Aorta: The ascending aorta was not well visualized. IAS/Shunts: No atrial level shunt detected by color flow  Doppler. Additional Comments: 3D was performed not requiring image post processing on an independent workstation and was indeterminate.  LEFT VENTRICLE PLAX 2D LVIDd:         5.10 cm     Diastology LVIDs:         3.13 cm     LV e' medial:    7.18 cm/s LV PW:         1.10 cm     LV E/e' medial:  10.8 LV IVS:        1.10 cm     LV e' lateral:   9.46 cm/s LVOT diam:     2.30 cm     LV E/e' lateral: 8.2 LV SV:         60 LV SV Index:  29 LVOT Area:     4.15 cm  LV Volumes (MOD) LV vol d, MOD A2C: 98.3 ml LV vol d, MOD A4C: 81.0 ml LV vol s, MOD A2C: 44.8 ml LV vol s, MOD A4C: 61.3 ml LV SV MOD A2C:     53.5 ml LV SV MOD A4C:     81.0 ml LV SV MOD BP:      41.5 ml RIGHT VENTRICLE RV Basal diam:  3.90 cm RV Mid diam:    3.20 cm RV S prime:     10.30 cm/s TAPSE (M-mode): 1.8 cm LEFT ATRIUM             Index        RIGHT ATRIUM           Index LA diam:        3.60 cm 1.76 cm/m   RA Area:     18.20 cm LA Vol (A2C):   42.2 ml 20.59 ml/m  RA Volume:   50.70 ml  24.74 ml/m LA Vol (A4C):   34.5 ml 16.84 ml/m LA Biplane Vol: 39.8 ml 19.42 ml/m  AORTIC VALVE                    PULMONIC VALVE AV Area (Vmax):    3.64 cm     PV Vmax:        0.87 m/s AV Area (Vmean):   3.53 cm     PV Vmean:       62.600 cm/s AV Area (VTI):     4.02 cm     PV VTI:         0.155 m AV Vmax:           92.00 cm/s   PV Peak grad:   3.1 mmHg AV Vmean:          64.200 cm/s  PV Mean grad:   2.0 mmHg AV VTI:            0.150 m      RVOT Peak grad: 2 mmHg AV Peak Grad:      3.4 mmHg AV Mean Grad:      2.0 mmHg LVOT Vmax:         80.70 cm/s LVOT Vmean:        54.500 cm/s LVOT VTI:          0.145 m LVOT/AV VTI ratio: 0.97  AORTA Ao Root diam: 3.60 cm Ao Asc diam:  3.10 cm MITRAL VALVE MV Area (PHT): 5.54 cm     SHUNTS MV Area VTI:   2.63 cm     Systemic VTI:  0.14 m MV Peak grad:  3.1 mmHg     Systemic Diam: 2.30 cm MV Mean grad:  2.0 mmHg     Pulmonic VTI:  0.108 m MV Vmax:       0.88 m/s MV Vmean:      61.5 cm/s MV Decel Time: 137 msec MV E  velocity: 77.60 cm/s MV A velocity: 105.00 cm/s MV E/A ratio:  0.74 Dwayne D Callwood MD Electronically signed by Cara JONETTA Lovelace MD Signature Date/Time: 04/18/2024/10:22:11 PM    Final    US  RENAL Result Date: 04/18/2024 CLINICAL DATA:  [id number] AKI (acute kidney injury) (HCC) [id number] EXAM: RENAL / URINARY TRACT ULTRASOUND COMPLETE COMPARISON:  Mar 10, 2024 FINDINGS: Right Kidney: Renal measurements: 13.1 x 6.3 x 10.3 cm = volume: 273 mL. Normal echogenicity. 1.2 x 0.8 x 0.9 cm cyst in  the upper pole. No hydronephrosis or nephrolithiasis. Left Kidney: Renal measurements: 12 x 5.4 x 5.3 cm = volume: 181 mL. Normal echogenicity. A couple of small cysts in the left kidney measuring up to 1.6 x 1.4 x 1.6 cm. No hydronephrosis or nephrolithiasis. Bladder: Circumferential wall thickening with trabeculation. Abnormal postvoid residual of 121 mL. Other: None. IMPRESSION: 1. No hydronephrosis or nephrolithiasis. 2. Circumferential wall thickening of the urinary bladder, which in the setting of prostatomegaly, may represent changes of chronic bladder outlet obstruction. If there is concern for acute cystitis, correlation with urinalysis would be recommended. 3. Abnormal postvoid residual of 121 mL. Electronically Signed   By: Rogelia Myers M.D.   On: 04/18/2024 16:00   CT Angio Chest PE W/Cm &/Or Wo Cm Result Date: 04/18/2024 CLINICAL DATA:  Pulmonary embolus suspected with high probability. Fever developed after biliary procedure today. EXAM: CT ANGIOGRAPHY CHEST CT ABDOMEN AND PELVIS WITH CONTRAST TECHNIQUE: Multidetector CT imaging of the chest was performed using the standard protocol during bolus administration of intravenous contrast. Multiplanar CT image reconstructions and MIPs were obtained to evaluate the vascular anatomy. Multidetector CT imaging of the abdomen and pelvis was performed using the standard protocol during bolus administration of intravenous contrast. RADIATION DOSE REDUCTION: This exam was  performed according to the departmental dose-optimization program which includes automated exposure control, adjustment of the mA and/or kV according to patient size and/or use of iterative reconstruction technique. CONTRAST:  OMNIPAQUE  IOHEXOL  350 MG/ML SOLN COMPARISON:  PET CT 04/12/2024.  CT chest 08/14/2020 FINDINGS: CTA CHEST FINDINGS Cardiovascular: Technically adequate study with good opacification of the central and segmental pulmonary arteries. Moderate motion artifact. No focal filling defects are demonstrated. No evidence of significant pulmonary embolus. Normal heart size. No pericardial effusions. Normal caliber thoracic aorta. No dissection. Great vessel origins are patent. Mediastinum/Nodes: No enlarged mediastinal, hilar, or axillary lymph nodes. Thyroid gland, trachea, and esophagus demonstrate no significant findings. Lungs/Pleura: Patchy peripheral infiltrates in the lungs may represent scarring or multifocal pneumonia. Similar appearance to previous study. No pleural effusion or pneumothorax. 7 mm diameter nodule in the right middle lung, series 3, image 70. Additional smaller nodule seen in both lungs. Musculoskeletal: Degenerative changes in the spine. No acute bony abnormalities. Review of the MIP images confirms the above findings. CT ABDOMEN and PELVIS FINDINGS Hepatobiliary: Residual contrast material demonstrated within mildly dilated intrahepatic bile ducts in the left lobe. A percutaneous biliary drainage catheter is present with tip in the duodenum. No focal liver lesions are seen. Extrahepatic bile ducts are not dilated. Gas in the gallbladder is likely result of procedure. Pancreas: Unremarkable. No pancreatic ductal dilatation or surrounding inflammatory changes. Spleen: Normal in size without focal abnormality. Adrenals/Urinary Tract: Adrenal glands are unremarkable. Kidneys are normal, without renal calculi, focal lesion, or hydronephrosis. Bladder is unremarkable.  Stomach/Bowel: Stomach, small bowel, and colon are not abnormally distended. No wall thickening or inflammatory changes. Appendix is not identified. Vascular/Lymphatic: Aortic atherosclerosis. No enlarged abdominal or pelvic lymph nodes. Reproductive: Prostate gland is enlarged Other: No free air or free fluid in the abdomen. No loculated collections are identified. Abdominal wall musculature appears intact. Musculoskeletal: Degenerative changes in the spine. No acute bony abnormalities. Review of the MIP images confirms the above findings. IMPRESSION: 1. Postprocedural changes in the liver with percutaneous biliary drain in place. Tip is in the duodenum. Residual contrast material within mildly dilated left intrahepatic bile ducts. Gas in the gallbladder is also likely postprocedural. 2. No abscess or free air in  the abdomen. 3. No evidence of significant pulmonary embolus. 4. Patchy peripheral infiltrates in the lungs possibly representing multifocal pneumonia or scarring. 5. Scattered nodules in the lungs, largest in the right middle lung measuring 7 mm diameter. Non-contrast chest CT at 3-6 months is recommended. If the nodules are stable at time of repeat CT, then future CT at 18-24 months (from today's scan) is considered optional for low-risk patients, but is recommended for high-risk patients. This recommendation follows the consensus statement: Guidelines for Management of Incidental Pulmonary Nodules Detected on CT Images: From the Fleischner Society 2017; Radiology 2017; 284:228-243. 6. Aortic atherosclerosis. 7. 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 Electronically Signed   By: Elsie Gravely M.D.   On: 04/18/2024 02:13   CT ABDOMEN PELVIS W CONTRAST Result Date: 04/18/2024 CLINICAL DATA:  Pulmonary embolus suspected with high probability. Fever developed after biliary procedure today. EXAM: CT ANGIOGRAPHY CHEST CT ABDOMEN AND PELVIS WITH CONTRAST TECHNIQUE: Multidetector CT imaging of the chest was performed  using the standard protocol during bolus administration of intravenous contrast. Multiplanar CT image reconstructions and MIPs were obtained to evaluate the vascular anatomy. Multidetector CT imaging of the abdomen and pelvis was performed using the standard protocol during bolus administration of intravenous contrast. RADIATION DOSE REDUCTION: This exam was performed according to the departmental dose-optimization program which includes automated exposure control, adjustment of the mA and/or kV according to patient size and/or use of iterative reconstruction technique. CONTRAST:  OMNIPAQUE  IOHEXOL  350 MG/ML SOLN COMPARISON:  PET CT 04/12/2024.  CT chest 08/14/2020 FINDINGS: CTA CHEST FINDINGS Cardiovascular: Technically adequate study with good opacification of the central and segmental pulmonary arteries. Moderate motion artifact. No focal filling defects are demonstrated. No evidence of significant pulmonary embolus. Normal heart size. No pericardial effusions. Normal caliber thoracic aorta. No dissection. Great vessel origins are patent. Mediastinum/Nodes: No enlarged mediastinal, hilar, or axillary lymph nodes. Thyroid gland, trachea, and esophagus demonstrate no significant findings. Lungs/Pleura: Patchy peripheral infiltrates in the lungs may represent scarring or multifocal pneumonia. Similar appearance to previous study. No pleural effusion or pneumothorax. 7 mm diameter nodule in the right middle lung, series 3, image 70. Additional smaller nodule seen in both lungs. Musculoskeletal: Degenerative changes in the spine. No acute bony abnormalities. Review of the MIP images confirms the above findings. CT ABDOMEN and PELVIS FINDINGS Hepatobiliary: Residual contrast material demonstrated within mildly dilated intrahepatic bile ducts in the left lobe. A percutaneous biliary drainage catheter is present with tip in the duodenum. No focal liver lesions are seen. Extrahepatic bile ducts are not dilated. Gas  in the gallbladder is likely result of procedure. Pancreas: Unremarkable. No pancreatic ductal dilatation or surrounding inflammatory changes. Spleen: Normal in size without focal abnormality. Adrenals/Urinary Tract: Adrenal glands are unremarkable. Kidneys are normal, without renal calculi, focal lesion, or hydronephrosis. Bladder is unremarkable. Stomach/Bowel: Stomach, small bowel, and colon are not abnormally distended. No wall thickening or inflammatory changes. Appendix is not identified. Vascular/Lymphatic: Aortic atherosclerosis. No enlarged abdominal or pelvic lymph nodes. Reproductive: Prostate gland is enlarged Other: No free air or free fluid in the abdomen. No loculated collections are identified. Abdominal wall musculature appears intact. Musculoskeletal: Degenerative changes in the spine. No acute bony abnormalities. Review of the MIP images confirms the above findings. IMPRESSION: 1. Postprocedural changes in the liver with percutaneous biliary drain in place. Tip is in the duodenum. Residual contrast material within mildly dilated left intrahepatic bile ducts. Gas in the gallbladder is also likely postprocedural. 2. No abscess or free air in  the abdomen. 3. No evidence of significant pulmonary embolus. 4. Patchy peripheral infiltrates in the lungs possibly representing multifocal pneumonia or scarring. 5. Scattered nodules in the lungs, largest in the right middle lung measuring 7 mm diameter. Non-contrast chest CT at 3-6 months is recommended. If the nodules are stable at time of repeat CT, then future CT at 18-24 months (from today's scan) is considered optional for low-risk patients, but is recommended for high-risk patients. This recommendation follows the consensus statement: Guidelines for Management of Incidental Pulmonary Nodules Detected on CT Images: From the Fleischner Society 2017; Radiology 2017; 284:228-243. 6. Aortic atherosclerosis. 7. 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 Electronically  Signed   By: Elsie Gravely M.D.   On: 04/18/2024 02:13   DG C-Arm 1-60 Min-No Report Result Date: 04/17/2024 Fluoroscopy was utilized by the requesting physician.  No radiographic interpretation.     Assessment/Plan: 78 year old male with history of biliary obstruction E. coli bacteremia with sepsis following ERCP done on monday On  ceftriaxone  as BCID did not show any ESBL Repeat blood culture ? Biliary obstruction.  Status post ERCP and stent replacement. Also has biliary drain Brushings have been sent andcytology shows reactive cells Increased CA 19-9    CAD status post CABG   Diabetes mellitus   Hypertension   AKI secondary to the infection   History of urethral strictures with frequent UTIs.  Was on suppressive therapy with nitrofurantoin    Discussed the management with the patient and his wife

## 2024-04-19 NOTE — Progress Notes (Signed)
 [facility] Progress Note  Patient: Casey Peters    FMW:[id number]  DOA: 04/17/2024     Date of Service: the patient was seen and examined on 04/19/2024  Chief Complaint  Patient presents with   Post-op Problem   Brief hospital course: Casey Peters is a 78 y.o. male with medical history significant for dilated cardiomyopathy, CAD RCA stent in 2023, HTN, HLD, IIDM, BPH with history of recurrent UTIs on suppressive antibiotics, recently hospitalized with painless jaundice, s/p ERCP  with biliary stent placement in 03/15/24, exchanged yesterday 04/17/24, with brushings sent to cytology who is being admitted for sepsis secondary to multifocal pneumonia.  History is given by wife at the bedside who states that he has been weak and staying in bed since his hospitalization a month ago when he had the first stent placed, however yesterday several hours after his procedure he developed a cough and a fever which prompted the visit to the ED.  She states he has had nausea but without vomiting and has ongoing abdominal pain for over a month which has not changed.  Patient appears very frail, lethargic and unable to contribute to history. In the ED tachycardic to the 140s to 150s and tachypneic to the 30s.  BP initially 107/63 but improving to the 160s following hydration.  O2 sat initially in the 90s on room air but desatted down to as low as 62 requiring 5 L O2 via nasal cannula to maintain sats in the high 90s. Labs notable for WBC 18,000 with lactic acid 1.3-->2.3.  Creatinine 1.45 up from baseline of 0.76 a month ago.  LFTs reassuring compared to prior.  BNP normal.  EKG with sinus tachycardia at 147 CT abdomen and pelvis showing postsurgical changes with drains in place and no acute findings.  And CTA PE protocol negative for PE but showing patchy peripheral infiltrates possibly representing multifocal pneumonia. Patient given sepsis fluid bolus and started on Zosyn  and vancomycin  for possible  intra-abdominal source. Admission requested    Assessment and Plan:  # Septic shock secondary to E. coli bacteremia and HCAP S/p 3 L fluid bolus given 7/8 S/p Levophed  given for 6 hours then patient was transferred to ICU, downgraded in the evening to [facility] service on 7/8 Continue maintenance IV fluid Monitor for fluid overload in view of history of HFrEF S/p cefepime , Flagyl  and vancomycin . ID consulted, continued Flagyl  and started ceftriaxone  due to E. coli bacteremia.  Discontinued vancomycin  and cefepime . Pharmacy consulted for dosing Blood culture growing E. 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 S/p ERCP 03/15/2024 and 04/17/2024, brush biopsy sent for cytology.  No acute procedural complications suspected. Cytology : Few atypical cells, favor reactive CT abdomen and pelvis nonacute GI consulted to follow 7/9 consulted IR for biliary drain exchange and repeat brush biopsy for adequate sampling to rule out malignancy  # Acute respiratory failure with hypoxia # HCAP (healthcare-associated pneumonia) Sepsis criteria include fever, tachycardia and tachypnea with leukocytosis and lactic acidosis with multifocal pneumonia Possible aspiration during sedation for procedure given timing of onset of symptoms Patient was hypoxic to 62% and tachypneic to the 30s requiring 5 L to maintain sats in the high 90s Continue cefepime  vancomycin  and Flagyl  Sepsis fluids Protonix SLP consult Patient is at high risk of clinical deterioration      #  AKI (acute kidney injury) due to septic shock Expecting improvement with IV fluid resuscitation Monitor renal function and avoid nephrotoxins  # Hypokalemia, potassium repleted. # Hypomagnesemia, mag repleted. # Hypophosphatemia,  Phos repleted. Monitor electrolytes and replete as needed.  # Metabolic acidosis Bicarb 100 mEq IV one-time dose given Monitor BMP daily    # STEMI involving right coronary artery [facility]) S/p cath with stent to RCA. Has residual post cath pain and is on nitroglycerin  infusion Post-cath orders per Dr. Lonni End Continues on carvedilol , rosuvastatin .  As needed labetalol  and hydralazine  for SBP over 150.  On Aggrastat  Troponin 89 possible demand ischemia Continue to trend troponin F/u Repeat TTE    # HFrEF (heart failure with reduced ejection fraction) (HCC) Dilated idiopathic cardiomyopathy (HCC) Hold carvedilol , spironolactone  in the setting of sepsis Daily weights   # Controlled type 2 diabetes mellitus without complication  Sliding scale insulin  coverage   # Pulmonary nodule, incidental finding. CT chest: Scattered nodules in the lungs, largest in the right middle lung measuring 7 mm diameter. Non-contrast chest CT at 3-6 months is recommended. If the nodules are stable at time of repeat CT, then future CT at 18-24 months (from today's scan) is considered optional for low-risk patients, but is recommended for high-risk patients. Recommended to follow with PCP and pulmonary as an outpatient. Patient is already following oncologist  # Vitamin D  deficiency: started vitamin D  50,000 units p.o. weekly, follow with PCP to repeat vitamin D  level after 3 to 6 months.   Body mass index is 27.92 kg/m.  Interventions:  Diet: CLD advance as per tolerance DVT Prophylaxis: Subcutaneous Heparin     Advance goals of care discussion: DNR/DNI-limited  Family Communication: family was present at bedside, at the time of interview.  The pt provided permission to discuss medical plan with the family. Opportunity was given to ask question and all questions were answered satisfactorily.   Disposition:  Pt is from Home, admitted with sepsis, resp failure, PNA, still on IV antibiotics,  transferred to ICU, which precludes a safe discharge. Discharge to Home, when stable, may need few days to improve.  Subjective: No significant events overnight.  Patient is feeling improvement, denied any specific complaints, no chest pain or palpitation, no shortness of breath, no abdominal pain. Patient tolerated clear liquid diet was advanced.    Physical Exam: General: NAD, lying comfortably Appear in no distress, affect appropriate Eyes: PERRLA ENT: Oral Mucosa Clear, moist  Neck: no JVD,  Cardiovascular: S1 and S2 Present, no Murmur,  Respiratory: Equal air entry bilaterally, mild bibasilar crackles, no wheezes  Abdomen: BS present, Soft and TD, biliary drain intact Skin: no rashes Extremities: no Pedal edema, no calf tenderness Neurologic: without any new focal findings Gait not checked due to patient safety concerns  Vitals:   04/19/24 0900 04/19/24 1000 04/19/24 1100 04/19/24 1200  BP: 108/62 103/64 116/60 118/66  Pulse: 91 86 88 87  Resp: 16 20 20 19   Temp:    98.2 F (36.8 C)  TempSrc:    Oral  SpO2: 100% 100% 97% 99%  Weight:      Height:        Intake/Output Summary (Last 24 hours) at 04/19/2024 1239 Last data filed at 04/19/2024 1125 Gross per 24 hour  Intake 3208.36 ml  Output 1400 ml  Net 1808.36 ml   Filed Weights   04/17/24 2325 04/19/24 0510  Weight: 84.8 kg 90.8 kg    Data Reviewed: I have personally reviewed and interpreted  daily labs, tele strips, imagings as discussed above. I reviewed all nursing notes, pharmacy notes, vitals, pertinent old records I have discussed plan of care as described above with RN and patient/family.  CBC: Recent Labs  Lab 04/17/24 2330 04/18/24 0905 04/19/24 0500  WBC 18.7* 17.1* 17.2*  NEUTROABS 15.8*  --   --   HGB 13.2 10.9* 11.1*  HCT 38.5* 32.4* 32.8*  MCV 94.4 95.0 94.5  PLT 196 157 153   Basic Metabolic Panel: Recent Labs  Lab 04/17/24 1133 04/17/24 2330 04/18/24 0522 04/18/24 0905  04/18/24 1821 04/19/24 0500  NA 138 136  --  140 139 140  K 4.3 4.0  --  2.8* 4.2 3.9  CL 106 106  --  112* 114* 113*  CO2 22 18*  --  20* 16* 19*  GLUCOSE 163* 192*  --  129* 196* 101*  BUN 33* 34*  --  33* 25* 24*  CREATININE 1.24 1.45* 1.44* 1.68* 1.22 1.20  CALCIUM  9.0 8.8*  --  7.8* 7.8* 8.0*  MG  --   --   --  1.4* 1.9 1.8  PHOS  --   --   --  2.2* 3.8 2.6    Studies: US  RENAL Result Date: 04/18/2024 CLINICAL DATA:  [id number] AKI (acute kidney injury) (HCC) [id number] EXAM: RENAL / URINARY TRACT ULTRASOUND COMPLETE COMPARISON:  Mar 10, 2024 FINDINGS: Right Kidney: Renal measurements: 13.1 x 6.3 x 10.3 cm = volume: 273 mL. Normal echogenicity. 1.2 x 0.8 x 0.9 cm cyst in the upper pole. No hydronephrosis or nephrolithiasis. Left Kidney: Renal measurements: 12 x 5.4 x 5.3 cm = volume: 181 mL. Normal echogenicity. A couple of small cysts in the left kidney measuring up to 1.6 x 1.4 x 1.6 cm. No hydronephrosis or nephrolithiasis. Bladder: Circumferential wall thickening with trabeculation. Abnormal postvoid residual of 121 mL. Other: None. IMPRESSION: 1. No hydronephrosis or nephrolithiasis. 2. Circumferential wall thickening of the urinary bladder, which in the setting of prostatomegaly, may represent changes of chronic bladder outlet obstruction. If there is concern for acute cystitis, correlation with urinalysis would be recommended. 3. Abnormal postvoid residual of 121 mL. Electronically Signed   By: Rogelia Myers M.D.   On: 04/18/2024 16:00    Scheduled Meds:  Chlorhexidine  Gluconate Cloth  6 each Topical Daily   guaiFENesin   600 mg Oral BID   heparin  injection (subcutaneous)  5,000 Units Subcutaneous Q8H   insulin  aspart  0-9 Units Subcutaneous Q4H   tamsulosin   0.4 mg Oral QPC supper   Vitamin D  (Ergocalciferol )  50,000 Units Oral Q7 days   Continuous Infusions:  cefTRIAXone  (ROCEPHIN )  IV 2 g (04/18/24 2003)   metronidazole  Stopped (04/19/24 0627)   PRN Meds: acetaminophen  **OR**  acetaminophen , albuterol , ondansetron  **OR** ondansetron  (ZOFRAN ) IV  Time spent: 55 minutes  Author: ELVAN SOR. MD [facility] 04/19/2024 12:39 PM  To reach On-call, see care teams to locate the attending and reach out to them via [email or web address]. If 7PM-7AM, please contact night-coverage If you still have difficulty reaching the attending provider, please page the [facility] (Director on Call) for [facility] on amion for assistance.

## 2024-04-19 NOTE — Progress Notes (Signed)
 IR asked to evaluate patient for biliary drain exchange with possible brush biopsy. Per Dr. Philip this procedure may be considered once patient is clinically stable. IR will follow along peripherally and will work with the primary teams to determine appropriate timing of procedure.   Please contact IR with any questions.  Warren Dais, AGACNP-BC 04/19/2024, 2:55 PM

## 2024-04-19 NOTE — Progress Notes (Signed)
 Rogelia Copping, MD [facility]   [address], [address] [place], [place] [address] Phone: [phone] Fax : [phone]   Subjective: The patient was admitted with sepsis and positive blood cultures after an ERCP with a stent change.  The patient is now off pressors and states that he is feeling well.  He is not having any abdominal pain which she had reported prior to having the ERCP.  The brushings from the ERCP showed reactive cells without any sign of malignancy.   Objective: Vital signs in last 24 hours: Vitals:   04/19/24 1300 04/19/24 1400 04/19/24 1500 04/19/24 1600  BP: (!) 114/58 124/81 123/68 125/60  Pulse: 88 96 99 94  Resp: (!) 28 (!) 33 (!) 30 (!) 34  Temp:    99.5 F (37.5 C)  TempSrc:    Oral  SpO2: 98% 99% 97% 97%  Weight:      Height:       Weight change: 5.977 kg  Intake/Output Summary (Last 24 hours) at 04/19/2024 1615 Last data filed at 04/19/2024 1500 Gross per 24 hour  Intake 3208.36 ml  Output 1850 ml  Net 1358.36 ml     Exam: Heart:: Regular rate and rhythm or without murmur or extra heart sounds Lungs: normal and clear to auscultation and percussion Abdomen: soft, nontender, normal bowel sounds   Lab Results: @LABTEST2 @ Micro Results: Recent Results (from the past 240 hours)  Blood culture (routine x 2)     Status: Abnormal (Preliminary result)   Collection Time: 04/17/24 11:29 PM   Specimen: Right Antecubital; Blood  Result Value Ref Range Status   Specimen Description   Final    RIGHT ANTECUBITAL Performed at [facility], [address], [place], [place] [address]    Special Requests   Final    BOTTLES DRAWN AEROBIC AND ANAEROBIC Blood Culture adequate volume Performed at [facility], [address] [place], [place] [address]    Culture  Setup Time   Final    GRAM NEGATIVE RODS IN BOTH AEROBIC AND ANAEROBIC BOTTLES GRAM STAIN REVIEWED-AGREE WITH RESULT DRT CRITICAL RESULT CALLED TO, READ BACK BY AND VERIFIED  WITH: TREY GREENWOOD AT 1020 04/18/24 RAM    Culture (A)  Final    ESCHERICHIA COLI SUSCEPTIBILITIES TO FOLLOW Performed at [facility], [address] [place], [place] [address]    Report Status PENDING  Incomplete  Blood Culture ID Panel (Reflexed)     Status: Abnormal   Collection Time: 04/17/24 11:29 PM  Result Value Ref Range Status   Enterococcus faecalis NOT DETECTED NOT DETECTED Final   Enterococcus Faecium NOT DETECTED NOT DETECTED Final   Listeria monocytogenes NOT DETECTED NOT DETECTED Final   Staphylococcus species NOT DETECTED NOT DETECTED Final   Staphylococcus aureus (BCID) NOT DETECTED NOT DETECTED Final   Staphylococcus epidermidis NOT DETECTED NOT DETECTED Final   Staphylococcus lugdunensis NOT DETECTED NOT DETECTED Final   Streptococcus species NOT DETECTED NOT DETECTED Final   Streptococcus agalactiae NOT DETECTED NOT DETECTED Final   Streptococcus pneumoniae NOT DETECTED NOT DETECTED Final   Streptococcus pyogenes NOT DETECTED NOT DETECTED Final   A.calcoaceticus-baumannii NOT DETECTED NOT DETECTED Final   Bacteroides fragilis NOT DETECTED NOT DETECTED Final   Enterobacterales DETECTED (A) NOT DETECTED Final    Comment: Enterobacterales represent a large order of gram negative bacteria, not a single organism. CRITICAL RESULT CALLED TO, READ BACK BY AND VERIFIED WITH: TREY GREENWOOD AT 1020 04/18/24 RAM  Enterobacter cloacae complex NOT DETECTED NOT DETECTED Final   Escherichia coli DETECTED (A) NOT DETECTED Final    Comment: RESULT CALLED TO, READ BACK BY AND VERIFIED WITH: TREY GREENWOOD AT 1020 04/18/24 RAM CRITICAL RESULT CALLED TO, READ BACK BY AND VERIFIED WITH:    Klebsiella aerogenes NOT DETECTED NOT DETECTED Final   Klebsiella oxytoca NOT DETECTED NOT DETECTED Final   Klebsiella pneumoniae NOT DETECTED NOT DETECTED Final   Proteus species NOT DETECTED NOT DETECTED Final   Salmonella species NOT DETECTED NOT DETECTED Final   Serratia marcescens  NOT DETECTED NOT DETECTED Final   Haemophilus influenzae NOT DETECTED NOT DETECTED Final   Neisseria meningitidis NOT DETECTED NOT DETECTED Final   Pseudomonas aeruginosa NOT DETECTED NOT DETECTED Final   Stenotrophomonas maltophilia NOT DETECTED NOT DETECTED Final   Candida albicans NOT DETECTED NOT DETECTED Final   Candida auris NOT DETECTED NOT DETECTED Final   Candida glabrata NOT DETECTED NOT DETECTED Final   Candida krusei NOT DETECTED NOT DETECTED Final   Candida parapsilosis NOT DETECTED NOT DETECTED Final   Candida tropicalis NOT DETECTED NOT DETECTED Final   Cryptococcus neoformans/gattii NOT DETECTED NOT DETECTED Final   CTX-M ESBL NOT DETECTED NOT DETECTED Final   Carbapenem resistance IMP NOT DETECTED NOT DETECTED Final   Carbapenem resistance KPC NOT DETECTED NOT DETECTED Final   Carbapenem resistance NDM NOT DETECTED NOT DETECTED Final   Carbapenem resist OXA 48 LIKE NOT DETECTED NOT DETECTED Final   Carbapenem resistance VIM NOT DETECTED NOT DETECTED Final    Comment: Performed at [facility], [address] [place], [place] [address]  Blood culture (routine x 2)     Status: None (Preliminary result)   Collection Time: 04/18/24  2:46 AM   Specimen: BLOOD  Result Value Ref Range Status   Specimen Description BLOOD RIGHT ARM  Final   Special Requests   Final    BOTTLES DRAWN AEROBIC AND ANAEROBIC Blood Culture results may not be optimal due to an inadequate volume of blood received in culture bottles   Culture  Setup Time   Final    GRAM NEGATIVE RODS ANAEROBIC BOTTLE ONLY RESULT CALLED TO, READ BACK BY AND VERIFIED WITH: TREY GREENWOOD AT 1420 04/18/24 RAM Performed at [facility], [address] [place], [place] [address]    Culture GRAM NEGATIVE RODS  Final   Report Status PENDING  Incomplete  MRSA Next Gen by PCR, Nasal     Status: None   Collection Time: 04/18/24  8:09 PM   Specimen: Nasal Mucosa; Nasal Swab  Result Value Ref Range Status    MRSA by PCR Next Gen NOT DETECTED NOT DETECTED Final    Comment: (NOTE) The GeneXpert MRSA Assay (FDA approved for NASAL specimens only), is one component of a comprehensive MRSA colonization surveillance program. It is not intended to diagnose MRSA infection nor to guide or monitor treatment for MRSA infections. Test performance is not FDA approved in patients less than 45 years old. Performed at [facility], [address] [place] [place], [place] [address]    Studies/Results: ECHOCARDIOGRAM COMPLETE Result Date: 04/18/2024    ECHOCARDIOGRAM REPORT   Patient Name:   RAYLAN HANTON Date of Exam: 04/18/2024 Medical Rec #:  [id number]            Height:       71.0 in Accession #:    [id number]           Weight:  187.0 lb Date of Birth:  May 26, 1946            BSA:          2.049 m Patient Age:    78 years             BP:           86/53 mmHg Patient Gender: M                    HR:           99 bpm. Exam Location:  [facility] Procedure: 2D Echo, Color Doppler, Cardiac Doppler and Intracardiac            Opacification Agent (Both Spectral and Color Flow Doppler were            utilized during procedure). Indications:     CHF-Acute Systolic I50.21  History:         Patient has prior history of Echocardiogram examinations, most                  recent 12/28/2021. CHF.  Sonographer:     Ashley McNeely-Sloane Referring Phys:  [id number] ELVAN SOR Diagnosing Phys: Dwayne D Callwood MD IMPRESSIONS  1. Left ventricular ejection fraction, by estimation, is 50 to 55%. The left ventricle has low normal function. The left ventricle has no regional wall motion abnormalities. Left ventricular diastolic parameters are consistent with Grade I diastolic dysfunction (impaired relaxation).  2. Right ventricular systolic function is normal. The right ventricular size is normal.  3. The mitral valve is normal in structure. Trivial mitral valve regurgitation.  4. The aortic valve is normal in structure. Aortic valve  regurgitation is not visualized. FINDINGS  Left Ventricle: Left ventricular ejection fraction, by estimation, is 50 to 55%. The left ventricle has low normal function. The left ventricle has no regional wall motion abnormalities. Definity  contrast agent was given IV to delineate the left ventricular endocardial borders. Strain was performed and the global longitudinal strain is indeterminate. Global longitudinal strain performed but not reported based on interpreter judgement due to suboptimal tracking. The left ventricular internal cavity size was normal in size. There is borderline left ventricular hypertrophy. Left ventricular diastolic parameters are consistent with Grade I diastolic dysfunction (impaired relaxation). Right Ventricle: The right ventricular size is normal. No increase in right ventricular wall thickness. Right ventricular systolic function is normal. Left Atrium: Left atrial size was normal in size. Right Atrium: Right atrial size was normal in size. Pericardium: There is no evidence of pericardial effusion. Mitral Valve: The mitral valve is normal in structure. Trivial mitral valve regurgitation. MV peak gradient, 3.1 mmHg. The mean mitral valve gradient is 2.0 mmHg. Tricuspid Valve: The tricuspid valve is normal in structure. Tricuspid valve regurgitation is mild. Aortic Valve: The aortic valve is normal in structure. Aortic valve regurgitation is not visualized. Aortic valve mean gradient measures 2.0 mmHg. Aortic valve peak gradient measures 3.4 mmHg. Aortic valve area, by VTI measures 4.02 cm. Pulmonic Valve: The pulmonic valve was normal in structure. Pulmonic valve regurgitation is not visualized. Aorta: The ascending aorta was not well visualized. IAS/Shunts: No atrial level shunt detected by color flow Doppler. Additional Comments: 3D was performed not requiring image post processing on an independent workstation and was indeterminate.  LEFT VENTRICLE PLAX 2D LVIDd:         5.10 cm      Diastology LVIDs:         3.13 cm  LV e' medial:    7.18 cm/s LV PW:         1.10 cm     LV E/e' medial:  10.8 LV IVS:        1.10 cm     LV e' lateral:   9.46 cm/s LVOT diam:     2.30 cm     LV E/e' lateral: 8.2 LV SV:         60 LV SV Index:   29 LVOT Area:     4.15 cm  LV Volumes (MOD) LV vol d, MOD A2C: 98.3 ml LV vol d, MOD A4C: 81.0 ml LV vol s, MOD A2C: 44.8 ml LV vol s, MOD A4C: 61.3 ml LV SV MOD A2C:     53.5 ml LV SV MOD A4C:     81.0 ml LV SV MOD BP:      41.5 ml RIGHT VENTRICLE RV Basal diam:  3.90 cm RV Mid diam:    3.20 cm RV S prime:     10.30 cm/s TAPSE (M-mode): 1.8 cm LEFT ATRIUM             Index        RIGHT ATRIUM           Index LA diam:        3.60 cm 1.76 cm/m   RA Area:     18.20 cm LA Vol (A2C):   42.2 ml 20.59 ml/m  RA Volume:   50.70 ml  24.74 ml/m LA Vol (A4C):   34.5 ml 16.84 ml/m LA Biplane Vol: 39.8 ml 19.42 ml/m  AORTIC VALVE                    PULMONIC VALVE AV Area (Vmax):    3.64 cm     PV Vmax:        0.87 m/s AV Area (Vmean):   3.53 cm     PV Vmean:       62.600 cm/s AV Area (VTI):     4.02 cm     PV VTI:         0.155 m AV Vmax:           92.00 cm/s   PV Peak grad:   3.1 mmHg AV Vmean:          64.200 cm/s  PV Mean grad:   2.0 mmHg AV VTI:            0.150 m      RVOT Peak grad: 2 mmHg AV Peak Grad:      3.4 mmHg AV Mean Grad:      2.0 mmHg LVOT Vmax:         80.70 cm/s LVOT Vmean:        54.500 cm/s LVOT VTI:          0.145 m LVOT/AV VTI ratio: 0.97  AORTA Ao Root diam: 3.60 cm Ao Asc diam:  3.10 cm MITRAL VALVE MV Area (PHT): 5.54 cm     SHUNTS MV Area VTI:   2.63 cm     Systemic VTI:  0.14 m MV Peak grad:  3.1 mmHg     Systemic Diam: 2.30 cm MV Mean grad:  2.0 mmHg     Pulmonic VTI:  0.108 m MV Vmax:       0.88 m/s MV Vmean:      61.5 cm/s MV Decel Time: 137 msec MV E velocity: 77.60 cm/s MV A  velocity: 105.00 cm/s MV E/A ratio:  0.74 Cara JONETTA Lovelace MD Electronically signed by Cara JONETTA Lovelace MD Signature Date/Time: 04/18/2024/10:22:11 PM    Final    US   RENAL Result Date: 04/18/2024 CLINICAL DATA:  [id number] AKI (acute kidney injury) (HCC) [id number] EXAM: RENAL / URINARY TRACT ULTRASOUND COMPLETE COMPARISON:  Mar 10, 2024 FINDINGS: Right Kidney: Renal measurements: 13.1 x 6.3 x 10.3 cm = volume: 273 mL. Normal echogenicity. 1.2 x 0.8 x 0.9 cm cyst in the upper pole. No hydronephrosis or nephrolithiasis. Left Kidney: Renal measurements: 12 x 5.4 x 5.3 cm = volume: 181 mL. Normal echogenicity. A couple of small cysts in the left kidney measuring up to 1.6 x 1.4 x 1.6 cm. No hydronephrosis or nephrolithiasis. Bladder: Circumferential wall thickening with trabeculation. Abnormal postvoid residual of 121 mL. Other: None. IMPRESSION: 1. No hydronephrosis or nephrolithiasis. 2. Circumferential wall thickening of the urinary bladder, which in the setting of prostatomegaly, may represent changes of chronic bladder outlet obstruction. If there is concern for acute cystitis, correlation with urinalysis would be recommended. 3. Abnormal postvoid residual of 121 mL. Electronically Signed   By: Rogelia Myers M.D.   On: 04/18/2024 16:00   CT Angio Chest PE W/Cm &/Or Wo Cm Result Date: 04/18/2024 CLINICAL DATA:  Pulmonary embolus suspected with high probability. Fever developed after biliary procedure today. EXAM: CT ANGIOGRAPHY CHEST CT ABDOMEN AND PELVIS WITH CONTRAST TECHNIQUE: Multidetector CT imaging of the chest was performed using the standard protocol during bolus administration of intravenous contrast. Multiplanar CT image reconstructions and MIPs were obtained to evaluate the vascular anatomy. Multidetector CT imaging of the abdomen and pelvis was performed using the standard protocol during bolus administration of intravenous contrast. RADIATION DOSE REDUCTION: This exam was performed according to the departmental dose-optimization program which includes automated exposure control, adjustment of the mA and/or kV according to patient size and/or use of iterative  reconstruction technique. CONTRAST:  OMNIPAQUE  IOHEXOL  350 MG/ML SOLN COMPARISON:  PET CT 04/12/2024.  CT chest 08/14/2020 FINDINGS: CTA CHEST FINDINGS Cardiovascular: Technically adequate study with good opacification of the central and segmental pulmonary arteries. Moderate motion artifact. No focal filling defects are demonstrated. No evidence of significant pulmonary embolus. Normal heart size. No pericardial effusions. Normal caliber thoracic aorta. No dissection. Great vessel origins are patent. Mediastinum/Nodes: No enlarged mediastinal, hilar, or axillary lymph nodes. Thyroid gland, trachea, and esophagus demonstrate no significant findings. Lungs/Pleura: Patchy peripheral infiltrates in the lungs may represent scarring or multifocal pneumonia. Similar appearance to previous study. No pleural effusion or pneumothorax. 7 mm diameter nodule in the right middle lung, series 3, image 70. Additional smaller nodule seen in both lungs. Musculoskeletal: Degenerative changes in the spine. No acute bony abnormalities. Review of the MIP images confirms the above findings. CT ABDOMEN and PELVIS FINDINGS Hepatobiliary: Residual contrast material demonstrated within mildly dilated intrahepatic bile ducts in the left lobe. A percutaneous biliary drainage catheter is present with tip in the duodenum. No focal liver lesions are seen. Extrahepatic bile ducts are not dilated. Gas in the gallbladder is likely result of procedure. Pancreas: Unremarkable. No pancreatic ductal dilatation or surrounding inflammatory changes. Spleen: Normal in size without focal abnormality. Adrenals/Urinary Tract: Adrenal glands are unremarkable. Kidneys are normal, without renal calculi, focal lesion, or hydronephrosis. Bladder is unremarkable. Stomach/Bowel: Stomach, small bowel, and colon are not abnormally distended. No wall thickening or inflammatory changes. Appendix is not identified. Vascular/Lymphatic: Aortic atherosclerosis. No  enlarged abdominal or pelvic lymph nodes. Reproductive: Prostate gland is  enlarged Other: No free air or free fluid in the abdomen. No loculated collections are identified. Abdominal wall musculature appears intact. Musculoskeletal: Degenerative changes in the spine. No acute bony abnormalities. Review of the MIP images confirms the above findings. IMPRESSION: 1. Postprocedural changes in the liver with percutaneous biliary drain in place. Tip is in the duodenum. Residual contrast material within mildly dilated left intrahepatic bile ducts. Gas in the gallbladder is also likely postprocedural. 2. No abscess or free air in the abdomen. 3. No evidence of significant pulmonary embolus. 4. Patchy peripheral infiltrates in the lungs possibly representing multifocal pneumonia or scarring. 5. Scattered nodules in the lungs, largest in the right middle lung measuring 7 mm diameter. Non-contrast chest CT at 3-6 months is recommended. If the nodules are stable at time of repeat CT, then future CT at 18-24 months (from today's scan) is considered optional for low-risk patients, but is recommended for high-risk patients. This recommendation follows the consensus statement: Guidelines for Management of Incidental Pulmonary Nodules Detected on CT Images: From the Fleischner Society 2017; Radiology 2017; 284:228-243. 6. Aortic atherosclerosis. 7. 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 Electronically Signed   By: Elsie Gravely M.D.   On: 04/18/2024 02:13   CT ABDOMEN PELVIS W CONTRAST Result Date: 04/18/2024 CLINICAL DATA:  Pulmonary embolus suspected with high probability. Fever developed after biliary procedure today. EXAM: CT ANGIOGRAPHY CHEST CT ABDOMEN AND PELVIS WITH CONTRAST TECHNIQUE: Multidetector CT imaging of the chest was performed using the standard protocol during bolus administration of intravenous contrast. Multiplanar CT image reconstructions and MIPs were obtained to evaluate the vascular anatomy. Multidetector CT  imaging of the abdomen and pelvis was performed using the standard protocol during bolus administration of intravenous contrast. RADIATION DOSE REDUCTION: This exam was performed according to the departmental dose-optimization program which includes automated exposure control, adjustment of the mA and/or kV according to patient size and/or use of iterative reconstruction technique. CONTRAST:  OMNIPAQUE  IOHEXOL  350 MG/ML SOLN COMPARISON:  PET CT 04/12/2024.  CT chest 08/14/2020 FINDINGS: CTA CHEST FINDINGS Cardiovascular: Technically adequate study with good opacification of the central and segmental pulmonary arteries. Moderate motion artifact. No focal filling defects are demonstrated. No evidence of significant pulmonary embolus. Normal heart size. No pericardial effusions. Normal caliber thoracic aorta. No dissection. Great vessel origins are patent. Mediastinum/Nodes: No enlarged mediastinal, hilar, or axillary lymph nodes. Thyroid gland, trachea, and esophagus demonstrate no significant findings. Lungs/Pleura: Patchy peripheral infiltrates in the lungs may represent scarring or multifocal pneumonia. Similar appearance to previous study. No pleural effusion or pneumothorax. 7 mm diameter nodule in the right middle lung, series 3, image 70. Additional smaller nodule seen in both lungs. Musculoskeletal: Degenerative changes in the spine. No acute bony abnormalities. Review of the MIP images confirms the above findings. CT ABDOMEN and PELVIS FINDINGS Hepatobiliary: Residual contrast material demonstrated within mildly dilated intrahepatic bile ducts in the left lobe. A percutaneous biliary drainage catheter is present with tip in the duodenum. No focal liver lesions are seen. Extrahepatic bile ducts are not dilated. Gas in the gallbladder is likely result of procedure. Pancreas: Unremarkable. No pancreatic ductal dilatation or surrounding inflammatory changes. Spleen: Normal in size without focal abnormality.  Adrenals/Urinary Tract: Adrenal glands are unremarkable. Kidneys are normal, without renal calculi, focal lesion, or hydronephrosis. Bladder is unremarkable. Stomach/Bowel: Stomach, small bowel, and colon are not abnormally distended. No wall thickening or inflammatory changes. Appendix is not identified. Vascular/Lymphatic: Aortic atherosclerosis. No enlarged abdominal or pelvic lymph nodes. Reproductive: Prostate gland is  enlarged Other: No free air or free fluid in the abdomen. No loculated collections are identified. Abdominal wall musculature appears intact. Musculoskeletal: Degenerative changes in the spine. No acute bony abnormalities. Review of the MIP images confirms the above findings. IMPRESSION: 1. Postprocedural changes in the liver with percutaneous biliary drain in place. Tip is in the duodenum. Residual contrast material within mildly dilated left intrahepatic bile ducts. Gas in the gallbladder is also likely postprocedural. 2. No abscess or free air in the abdomen. 3. No evidence of significant pulmonary embolus. 4. Patchy peripheral infiltrates in the lungs possibly representing multifocal pneumonia or scarring. 5. Scattered nodules in the lungs, largest in the right middle lung measuring 7 mm diameter. Non-contrast chest CT at 3-6 months is recommended. If the nodules are stable at time of repeat CT, then future CT at 18-24 months (from today's scan) is considered optional for low-risk patients, but is recommended for high-risk patients. This recommendation follows the consensus statement: Guidelines for Management of Incidental Pulmonary Nodules Detected on CT Images: From the Fleischner Society 2017; Radiology 2017; 284:228-243. 6. Aortic atherosclerosis. 7. 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 Electronically Signed   By: Elsie Gravely M.D.   On: 04/18/2024 02:13   Medications: I have reviewed the patient's current medications. Scheduled Meds:  Chlorhexidine  Gluconate Cloth  6 each Topical Daily    guaiFENesin   600 mg Oral BID   heparin  injection (subcutaneous)  5,000 Units Subcutaneous Q8H   insulin  aspart  0-9 Units Subcutaneous Q4H   tamsulosin   0.4 mg Oral QPC supper   Vitamin D  (Ergocalciferol )  50,000 Units Oral Q7 days   Continuous Infusions:  cefTRIAXone  (ROCEPHIN )  IV 2 g (04/18/24 2003)   metronidazole  Stopped (04/19/24 0627)   PRN Meds:.acetaminophen  **OR** acetaminophen , albuterol , ondansetron  **OR** ondansetron  (ZOFRAN ) IV   Assessment: Principal Problem:   HCAP (healthcare-associated pneumonia) Active Problems:   Biliary stenosis   Severe sepsis (HCC)   Acute respiratory failure with hypoxia (HCC)   AKI (acute kidney injury) (HCC)   Septic shock (HCC)    Plan: The patient has a PET scan that was done for his liver lesion and positive CA 19-9.  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  I have asked interventional radiology to consider brushing the duct and changing the stent since they have better access to the area in question then I do with the percutaneous stent in place.  They are concerned about exacerbating the sepsis at this time.  I recommend continue following the patient's symptoms and continue antibiotics.   LOS: 1 day   Rogelia Copping, MD.FACG 04/19/2024, 4:15 PM Pager [phone] 7am-5pm  Check AMION for 5pm -7am coverage and on weekends

## 2024-04-20 DIAGNOSIS — J189 Pneumonia, unspecified organism: Secondary | ICD-10-CM | POA: Diagnosis not present

## 2024-04-20 DIAGNOSIS — N179 Acute kidney failure, unspecified: Secondary | ICD-10-CM | POA: Diagnosis not present

## 2024-04-20 DIAGNOSIS — B9620 Unspecified Escherichia coli [E. coli] as the cause of diseases classified elsewhere: Secondary | ICD-10-CM | POA: Diagnosis not present

## 2024-04-20 DIAGNOSIS — R7881 Bacteremia: Secondary | ICD-10-CM | POA: Diagnosis not present

## 2024-04-20 DIAGNOSIS — K831 Obstruction of bile duct: Secondary | ICD-10-CM | POA: Diagnosis not present

## 2024-04-20 LAB — CBC
HCT: 34 % — ABNORMAL LOW (ref 39.0–52.0)
Hemoglobin: 11.4 g/dL — ABNORMAL LOW (ref 13.0–17.0)
MCH: 31.2 pg (ref 26.0–34.0)
MCHC: 33.5 g/dL (ref 30.0–36.0)
MCV: 93.2 fL (ref 80.0–100.0)
Platelets: 139 K/uL — ABNORMAL LOW (ref 150–400)
RBC: 3.65 MIL/uL — ABNORMAL LOW (ref 4.22–5.81)
RDW: 15.6 % — ABNORMAL HIGH (ref 11.5–15.5)
WBC: 9.3 K/uL (ref 4.0–10.5)
nRBC: 0 % (ref 0.0–0.2)

## 2024-04-20 LAB — CULTURE, BLOOD (ROUTINE X 2): Special Requests: ADEQUATE

## 2024-04-20 LAB — HEPATIC FUNCTION PANEL
ALT: 40 U/L (ref 0–44)
AST: 21 U/L (ref 15–41)
Albumin: 2.2 g/dL — ABNORMAL LOW (ref 3.5–5.0)
Alkaline Phosphatase: 79 U/L (ref 38–126)
Bilirubin, Direct: 0.6 mg/dL — ABNORMAL HIGH (ref 0.0–0.2)
Indirect Bilirubin: 0.9 mg/dL (ref 0.3–0.9)
Total Bilirubin: 1.5 mg/dL — ABNORMAL HIGH (ref 0.0–1.2)
Total Protein: 5.6 g/dL — ABNORMAL LOW (ref 6.5–8.1)

## 2024-04-20 LAB — BASIC METABOLIC PANEL WITH GFR
Anion gap: 9 (ref 5–15)
BUN: 21 mg/dL (ref 8–23)
CO2: 24 mmol/L (ref 22–32)
Calcium: 7.9 mg/dL — ABNORMAL LOW (ref 8.9–10.3)
Chloride: 104 mmol/L (ref 98–111)
Creatinine, Ser: 1.28 mg/dL — ABNORMAL HIGH (ref 0.61–1.24)
GFR, Estimated: 58 mL/min — ABNORMAL LOW (ref >60)
Glucose, Bld: 121 mg/dL — ABNORMAL HIGH (ref 70–99)
Potassium: 3.2 mmol/L — ABNORMAL LOW (ref 3.5–5.1)
Sodium: 137 mmol/L (ref 135–145)

## 2024-04-20 LAB — GLUCOSE, CAPILLARY
Glucose-Capillary: 115 mg/dL — ABNORMAL HIGH (ref 70–99)
Glucose-Capillary: 112 mg/dL — ABNORMAL HIGH (ref 70–99)
Glucose-Capillary: 144 mg/dL — ABNORMAL HIGH (ref 70–99)
Glucose-Capillary: 141 mg/dL — ABNORMAL HIGH (ref 70–99)
Glucose-Capillary: 177 mg/dL — ABNORMAL HIGH (ref 70–99)
Glucose-Capillary: 191 mg/dL — ABNORMAL HIGH (ref 70–99)

## 2024-04-20 LAB — MAGNESIUM: Magnesium: 1.6 mg/dL — ABNORMAL LOW (ref 1.7–2.4)

## 2024-04-20 LAB — PHOSPHORUS: Phosphorus: 2.4 mg/dL — ABNORMAL LOW (ref 2.5–4.6)

## 2024-04-20 MED ORDER — GLUCERNA SHAKE PO LIQD
237.0000 mL | Freq: Three times a day (TID) | ORAL | Status: DC
  Administered 2024-04-20 – 2024-04-22 (×6): 237 mL via ORAL

## 2024-04-20 MED ORDER — METRONIDAZOLE 500 MG PO TABS: 500.0000 mg | ORAL_TABLET | Freq: Two times a day (BID) | ORAL | Status: DC

## 2024-04-20 MED ORDER — MAGNESIUM SULFATE 2 GM/50ML IV SOLN
2.0000 g | Freq: Once | INTRAVENOUS | Status: AC
  Administered 2024-04-20: 2 g via INTRAVENOUS
  Filled 2024-04-20: qty 50

## 2024-04-20 MED ORDER — K PHOS MONO-SOD PHOS DI & MONO 155-852-130 MG PO TABS
500.0000 mg | ORAL_TABLET | Freq: Four times a day (QID) | ORAL | Status: AC
  Administered 2024-04-20 (×2): 500 mg via ORAL
  Filled 2024-04-20 (×2): qty 2

## 2024-04-20 MED ORDER — POTASSIUM CHLORIDE CRYS ER 20 MEQ PO TBCR
40.0000 meq | EXTENDED_RELEASE_TABLET | Freq: Once | ORAL | Status: AC
  Administered 2024-04-20: 40 meq via ORAL
  Filled 2024-04-20: qty 2

## 2024-04-20 MED ORDER — SILODOSIN 8 MG PO CAPS
8.0000 mg | ORAL_CAPSULE | Freq: Every day | ORAL | Status: DC
  Administered 2024-04-20 – 2024-04-24 (×5): 8 mg via ORAL
  Filled 2024-04-20 (×5): qty 1

## 2024-04-20 MED ORDER — INSULIN ASPART 100 UNIT/ML IJ SOLN
0.0000 [IU] | Freq: Three times a day (TID) | INTRAMUSCULAR | Status: DC
  Administered 2024-04-20: 2 [IU] via SUBCUTANEOUS
  Administered 2024-04-20 – 2024-04-21 (×3): 1 [IU] via SUBCUTANEOUS
  Administered 2024-04-21: 2 [IU] via SUBCUTANEOUS
  Administered 2024-04-21: 1 [IU] via SUBCUTANEOUS
  Administered 2024-04-22: 2 [IU] via SUBCUTANEOUS
  Administered 2024-04-22 (×3): 1 [IU] via SUBCUTANEOUS
  Administered 2024-04-23: 2 [IU] via SUBCUTANEOUS
  Administered 2024-04-24 (×2): 1 [IU] via SUBCUTANEOUS
  Filled 2024-04-20 (×12): qty 1

## 2024-04-20 MED ORDER — PIPERACILLIN-TAZOBACTAM 3.375 G IVPB
3.3750 g | Freq: Three times a day (TID) | INTRAVENOUS | Status: DC
  Administered 2024-04-20 – 2024-04-24 (×12): 3.375 g via INTRAVENOUS
  Filled 2024-04-20 (×12): qty 50

## 2024-04-20 MED ORDER — TESTOSTERONE 50 MG/5GM (1%) TD GEL
5.0000 g | Freq: Every day | TRANSDERMAL | Status: DC
  Administered 2024-04-20 – 2024-04-24 (×5): 5 g via TRANSDERMAL
  Filled 2024-04-20 (×5): qty 5

## 2024-04-20 MED ORDER — SODIUM CHLORIDE 0.9 % IV SOLN
2.0000 g | INTRAVENOUS | Status: DC
  Filled 2024-04-20: qty 2

## 2024-04-20 NOTE — Progress Notes (Signed)
 PT Cancellation Note  Patient Details Name: Casey Peters MRN: [id number] DOB: [birth year]   Cancelled Treatment:    Reason Eval/Treat Not Completed: Other (comment) Chart revied, pt ran a fever a 3 pm. Pt declined therapy upon entering room d/t feeling weak. PT/OT repositioned pt in bed. Will check back tomorrow morning to perform therapy once appropriate.    Tyna Huertas Romero-Perozo, SPT  04/20/2024, 3:54 PM

## 2024-04-20 NOTE — Care Management Important Message (Signed)
 Important Message  Patient Details  Name: Casey Peters MRN: [id number] Date of Birth: July 08, 1946   Important Message Given:  Yes - Medicare IM     Rojelio SHAUNNA Rattler 04/20/2024, 2:16 PM

## 2024-04-20 NOTE — Consult Note (Signed)
 Chief Complaint: Patient was seen in consultation today for biliary drain care with possible biopsy   Referring Physician(s): Dr. Elvan Sor  Supervising Physician: Luverne Aran  Patient Status: [facility] - In-pt  History of Present Illness: Casey Peters is a 78 y.o. male known to IR from a previous biliary drain placement with Dr. 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 He presented to [facility] on 7/7 for a scheduled ERCP with possible stent placement with Dr. Jinny. Per op note, a stent was placed into the CBD and brush biopsies were performed. Patient tolerated the procedure well and was discharged home. He returned to the ED later that evening with a fever. He was found to be septic with multifocal pneumonia and was admitted to the ICU. IR was consulted for possible biliary drain exchange with brush biopsies.  Unfortunately patient continues to remain tachypneic and is now febrile up to 103.1. Multiple discussions were had with the patient and family at the bedside as well as with the Hospital team. At this time, patient and family wish to hold off on the procedure until the patient has improved. They are happy to reconsider this procedure later during his admission should he feel up to it.    Past Medical History:  Diagnosis Date   Cardiomegaly    CHF (congestive heart failure) (HCC)    Constipation    Diabetic polyneuropathy (HCC)    Dilated idiopathic cardiomyopathy (HCC)    Erectile dysfunction    HLD (hyperlipidemia)    HTN (hypertension)    Low testosterone     Palpitations    Pneumonia    T2DM (type 2 diabetes mellitus) (HCC)     Past Surgical History:  Procedure Laterality Date   CORONARY/GRAFT ACUTE MI REVASCULARIZATION N/A 12/27/2021   Procedure: Coronary/Graft Acute MI Revascularization;  Surgeon: Mady Bruckner, MD;  Location: [facility] INVASIVE CV LAB;  Service: Cardiovascular;  Laterality: N/A;   CYSTOSCOPY WITH URETHRAL  DILATATION N/A 06/17/2021   Procedure: CYSTOSCOPY WITH URETHRAL DILATATION;  Surgeon: Twylla Glendia BROCKS, MD;  Location: [facility] ORS;  Service: Urology;  Laterality: N/A;  Optilume Balloon   ERCP N/A 03/15/2024   Procedure: ERCP, WITH INTERVENTION IF INDICATED;  Surgeon: Jinny Carmine, MD;  Location: [facility] ENDOSCOPY;  Service: Endoscopy;  Laterality: N/A;   ERCP N/A 04/17/2024   Procedure: ERCP, WITH INTERVENTION IF INDICATED;  Surgeon: Jinny Carmine, MD;  Location: [facility] ENDOSCOPY;  Service: Endoscopy;  Laterality: N/A;   HERNIA REPAIR  1998   IR BILIARY DRAIN PLACEMENT WITH CHOLANGIOGRAM  03/16/2024   LEFT HEART CATH AND CORONARY ANGIOGRAPHY N/A 12/27/2021   Procedure: LEFT HEART CATH AND CORONARY ANGIOGRAPHY;  Surgeon: Mady Bruckner, MD;  Location: [facility] INVASIVE CV LAB;  Service: Cardiovascular;  Laterality: N/A;   PILONIDAL CYST EXCISION     TONSILLECTOMY     URETHRA SURGERY  1995    Allergies: Citalopram, Sulfamethoxazole -trimethoprim , and Trimethoprim   Medications: Prior to Admission medications   Medication Sig Start Date End Date Taking? Authorizing Provider  aspirin  EC 81 MG tablet Take 81 mg by mouth daily.   Yes [provider]  carvedilol  (COREG ) 6.25 MG tablet Take 6.25 mg by mouth 2 (two) times daily with a meal.   Yes [provider]  glimepiride (AMARYL) 2 MG tablet Take 2 mg by mouth. 03/21/24 03/21/25 Yes [provider]  JANUVIA 50 MG tablet Take 1 tablet by mouth daily. 02/29/24 02/28/25 Yes [provider]  nitrofurantoin , macrocrystal-monohydrate, (MACROBID ) 100  MG capsule TAKE 1 CAPSULE BY MOUTH ONCE DAILY 11/18/23  Yes Vaillancourt, Samantha, PA-C  omeprazole  (PRILOSEC) 20 MG capsule Take 1 capsule (20 mg total) by mouth daily. 04/05/24  Yes Borders, Fonda SAUNDERS, NP  ondansetron  (ZOFRAN ) 8 MG tablet One pill every 8 hours as needed for nausea/vomitting. 03/28/24  Yes Rennie Cindy SAUNDERS, MD  oxyCODONE  (OXY IR/ROXICODONE ) 5 MG immediate release tablet  Take 1 tablet (5 mg total) by mouth every 8 (eight) hours as needed for severe pain (pain score 7-10). 04/05/24  Yes Borders, Fonda SAUNDERS, NP  senna (SENOKOT) 8.6 MG TABS tablet Take 1 tablet (8.6 mg total) by mouth daily as needed for mild constipation. 04/05/24  Yes Borders, Fonda SAUNDERS, NP  silodosin  (RAPAFLO ) 8 MG CAPS capsule Take 1 capsule (8 mg total) by mouth daily with breakfast. 12/10/23  Yes Vaillancourt, Samantha, PA-C  spironolactone  (ALDACTONE ) 25 MG tablet Take 12.5 mg by mouth daily. 01/04/24  Yes [provider]  testosterone  (ANDROGEL ) 50 MG/5GM (1%) GEL Place 5 g onto the skin daily. Please confirm with your cardiologist before resuming 12/29/21  Yes Caleen Qualia, MD  ascorbic acid (VITAMIN C) 500 MG tablet Take 500 mg by mouth daily. Patient not taking: No sig reported    [provider]  cholecalciferol (VITAMIN D3) 25 MCG (1000 UNIT) tablet Take 1,000 Units by mouth daily. Patient not taking: Reported on 03/28/2024    [provider]  citalopram (CELEXA) 20 MG tablet Take 20 mg by mouth daily. Patient not taking: Reported on 03/28/2024 02/03/24   [provider]  furosemide  (LASIX ) 40 MG tablet Take 40 mg by mouth daily. Patient not taking: Reported on 04/18/2024    [provider]  glimepiride (AMARYL) 1 MG tablet Take 1 mg by mouth daily with breakfast. Patient not taking: Reported on 03/28/2024 03/03/24 03/03/25  [provider]  metFORMIN  (GLUCOPHAGE ) 1000 MG tablet Take 1,000 mg by mouth 2 (two) times daily. Patient not taking: Reported on 03/14/2024 11/30/23   [provider]  naloxone  (NARCAN ) nasal spray 4 mg/0.1 mL SPRAY 1 SPRAY INTO ONE NOSTRIL AS DIRECTED FOR OPIOID OVERDOSE (TURN PERSON ON SIDE AFTER DOSE. IF NO RESPONSE IN 2-3 MINUTES OR PERSON RESPONDS BUT RELAPSES, REPEAT USING A NEW SPRAY DEVICE AND SPRAY INTO THE OTHER NOSTRIL. CALL 911 AFTER USE.) * EMERGENCY USE ONLY * Patient not taking: Reported on 04/18/2024 04/05/24    Borders, Fonda SAUNDERS, NP  Zinc Gluconate 30 MG TABS Take 30 mg by mouth daily. Patient not taking: No sig reported    [provider]     Family History  Problem Relation Age of Onset   Hematuria Father    Kidney disease Neg Hx    Prostate cancer Neg Hx     Social History   Socioeconomic History   Marital status: Married    Spouse name: Not on file   Number of children: Not on file   Years of education: Not on file   Highest education level: Not on file  Occupational History   Not on file  Tobacco Use   Smoking status: Every Day    Types: Cigars   Smokeless tobacco: Never   Tobacco comments:    2-3 cigars per day  Vaping Use   Vaping status: Never Used  Substance and Sexual Activity   Alcohol use: No    Alcohol/week: 0.0 standard drinks of alcohol   Drug use: No   Sexual activity: Yes    Birth control/protection:  None  Other Topics Concern   Not on file  Social History Narrative   Not on file   Social Drivers of Health   Financial Resource Strain: Not on file  Food Insecurity: No Food Insecurity (04/19/2024)   Hunger Vital Sign    Worried About Running Out of Food in the Last Year: Never true    Ran Out of Food in the Last Year: Never true  Transportation Needs: No Transportation Needs (04/19/2024)   PRAPARE - Administrator, Civil Service (Medical): No    Lack of Transportation (Non-Medical): No  Physical Activity: Not on file  Stress: Not on file  Social Connections: Socially Integrated (04/19/2024)   Social Connection and Isolation Panel    Frequency of Communication with Friends and Family: More than three times a week    Frequency of Social Gatherings with Friends and Family: Twice a week    Attends Religious Services: More than 4 times per year    Active Member of [employer] or Organizations: Yes    Attends Engineer, structural: More than 4 times per year    Marital Status: Married    Review of Systems  Constitutional:  Positive  for chills and fever.  Respiratory:  Positive for shortness of breath.   All other ROS negative.  Vital Signs: BP 128/72   Pulse 89   Temp (!) 102.3 F (39.1 C) (Axillary)   Resp (!) 29   Ht 5' 11 (1.803 m)   Wt 200 lb 2.8 oz (90.8 kg)   SpO2 95%   BMI 27.92 kg/m    Physical Exam Vitals reviewed.  Constitutional:      Appearance: Normal appearance.  HENT:     Head: Normocephalic and atraumatic.     Mouth/Throat:     Mouth: Mucous membranes are moist.     Pharynx: Oropharynx is clear.  Cardiovascular:     Rate and Rhythm: Normal rate and regular rhythm.  Pulmonary:     Effort: Tachypnea present.  Abdominal:     General: Abdomen is flat.     Palpations: Abdomen is soft.     Comments: RUQ drain in place with ~135mL of bilious output in gravity bag  Musculoskeletal:        General: Normal range of motion.     Cervical back: Normal range of motion.  Skin:    General: Skin is warm.  Neurological:     General: No focal deficit present.     Mental Status: He is alert and oriented to person, place, and time. Mental status is at baseline.  Psychiatric:        Mood and Affect: Mood normal.        Behavior: Behavior normal.        Judgment: Judgment normal.     Imaging: ECHOCARDIOGRAM COMPLETE Result Date: 04/18/2024    ECHOCARDIOGRAM REPORT   Patient Name:   MARGARITA BOBROWSKI Date of Exam: 04/18/2024 Medical Rec #:  [id number]            Height:       71.0 in Accession #:    [id number]           Weight:       187.0 lb Date of Birth:  [birth year]            BSA:          2.049 m Patient Age:    56 years  BP:           86/53 mmHg Patient Gender: M                    HR:           99 bpm. Exam Location:  [facility] Procedure: 2D Echo, Color Doppler, Cardiac Doppler and Intracardiac            Opacification Agent (Both Spectral and Color Flow Doppler were            utilized during procedure). Indications:     CHF-Acute Systolic I50.21  History:         Patient has prior  history of Echocardiogram examinations, most                  recent 12/28/2021. CHF.  Sonographer:     Ashley McNeely-Sloane Referring Phys:  [id number] ELVAN SOR Diagnosing Phys: Dwayne D Callwood MD IMPRESSIONS  1. Left ventricular ejection fraction, by estimation, is 50 to 55%. The left ventricle has low normal function. The left ventricle has no regional wall motion abnormalities. Left ventricular diastolic parameters are consistent with Grade I diastolic dysfunction (impaired relaxation).  2. Right ventricular systolic function is normal. The right ventricular size is normal.  3. The mitral valve is normal in structure. Trivial mitral valve regurgitation.  4. The aortic valve is normal in structure. Aortic valve regurgitation is not visualized. FINDINGS  Left Ventricle: Left ventricular ejection fraction, by estimation, is 50 to 55%. The left ventricle has low normal function. The left ventricle has no regional wall motion abnormalities. Definity  contrast agent was given IV to delineate the left ventricular endocardial borders. Strain was performed and the global longitudinal strain is indeterminate. Global longitudinal strain performed but not reported based on interpreter judgement due to suboptimal tracking. The left ventricular internal cavity size was normal in size. There is borderline left ventricular hypertrophy. Left ventricular diastolic parameters are consistent with Grade I diastolic dysfunction (impaired relaxation). Right Ventricle: The right ventricular size is normal. No increase in right ventricular wall thickness. Right ventricular systolic function is normal. Left Atrium: Left atrial size was normal in size. Right Atrium: Right atrial size was normal in size. Pericardium: There is no evidence of pericardial effusion. Mitral Valve: The mitral valve is normal in structure. Trivial mitral valve regurgitation. MV peak gradient, 3.1 mmHg. The mean mitral valve gradient is 2.0 mmHg. Tricuspid  Valve: The tricuspid valve is normal in structure. Tricuspid valve regurgitation is mild. Aortic Valve: The aortic valve is normal in structure. Aortic valve regurgitation is not visualized. Aortic valve mean gradient measures 2.0 mmHg. Aortic valve peak gradient measures 3.4 mmHg. Aortic valve area, by VTI measures 4.02 cm. Pulmonic Valve: The pulmonic valve was normal in structure. Pulmonic valve regurgitation is not visualized. Aorta: The ascending aorta was not well visualized. IAS/Shunts: No atrial level shunt detected by color flow Doppler. Additional Comments: 3D was performed not requiring image post processing on an independent workstation and was indeterminate.  LEFT VENTRICLE PLAX 2D LVIDd:         5.10 cm     Diastology LVIDs:         3.13 cm     LV e' medial:    7.18 cm/s LV PW:         1.10 cm     LV E/e' medial:  10.8 LV IVS:        1.10 cm     LV e'  lateral:   9.46 cm/s LVOT diam:     2.30 cm     LV E/e' lateral: 8.2 LV SV:         60 LV SV Index:   29 LVOT Area:     4.15 cm  LV Volumes (MOD) LV vol d, MOD A2C: 98.3 ml LV vol d, MOD A4C: 81.0 ml LV vol s, MOD A2C: 44.8 ml LV vol s, MOD A4C: 61.3 ml LV SV MOD A2C:     53.5 ml LV SV MOD A4C:     81.0 ml LV SV MOD BP:      41.5 ml RIGHT VENTRICLE RV Basal diam:  3.90 cm RV Mid diam:    3.20 cm RV S prime:     10.30 cm/s TAPSE (M-mode): 1.8 cm LEFT ATRIUM             Index        RIGHT ATRIUM           Index LA diam:        3.60 cm 1.76 cm/m   RA Area:     18.20 cm LA Vol (A2C):   42.2 ml 20.59 ml/m  RA Volume:   50.70 ml  24.74 ml/m LA Vol (A4C):   34.5 ml 16.84 ml/m LA Biplane Vol: 39.8 ml 19.42 ml/m  AORTIC VALVE                    PULMONIC VALVE AV Area (Vmax):    3.64 cm     PV Vmax:        0.87 m/s AV Area (Vmean):   3.53 cm     PV Vmean:       62.600 cm/s AV Area (VTI):     4.02 cm     PV VTI:         0.155 m AV Vmax:           92.00 cm/s   PV Peak grad:   3.1 mmHg AV Vmean:          64.200 cm/s  PV Mean grad:   2.0 mmHg AV VTI:             0.150 m      RVOT Peak grad: 2 mmHg AV Peak Grad:      3.4 mmHg AV Mean Grad:      2.0 mmHg LVOT Vmax:         80.70 cm/s LVOT Vmean:        54.500 cm/s LVOT VTI:          0.145 m LVOT/AV VTI ratio: 0.97  AORTA Ao Root diam: 3.60 cm Ao Asc diam:  3.10 cm MITRAL VALVE MV Area (PHT): 5.54 cm     SHUNTS MV Area VTI:   2.63 cm     Systemic VTI:  0.14 m MV Peak grad:  3.1 mmHg     Systemic Diam: 2.30 cm MV Mean grad:  2.0 mmHg     Pulmonic VTI:  0.108 m MV Vmax:       0.88 m/s MV Vmean:      61.5 cm/s MV Decel Time: 137 msec MV E velocity: 77.60 cm/s MV A velocity: 105.00 cm/s MV E/A ratio:  0.74 Dwayne D Callwood MD Electronically signed by Cara JONETTA Lovelace MD Signature Date/Time: 04/18/2024/10:22:11 PM    Final    US  RENAL Result Date: 04/18/2024 CLINICAL DATA:  [id number] AKI (acute kidney injury) (HCC) [id number]  EXAM: RENAL / URINARY TRACT ULTRASOUND COMPLETE COMPARISON:  Mar 10, 2024 FINDINGS: Right Kidney: Renal measurements: 13.1 x 6.3 x 10.3 cm = volume: 273 mL. Normal echogenicity. 1.2 x 0.8 x 0.9 cm cyst in the upper pole. No hydronephrosis or nephrolithiasis. Left Kidney: Renal measurements: 12 x 5.4 x 5.3 cm = volume: 181 mL. Normal echogenicity. A couple of small cysts in the left kidney measuring up to 1.6 x 1.4 x 1.6 cm. No hydronephrosis or nephrolithiasis. Bladder: Circumferential wall thickening with trabeculation. Abnormal postvoid residual of 121 mL. Other: None. IMPRESSION: 1. No hydronephrosis or nephrolithiasis. 2. Circumferential wall thickening of the urinary bladder, which in the setting of prostatomegaly, may represent changes of chronic bladder outlet obstruction. If there is concern for acute cystitis, correlation with urinalysis would be recommended. 3. Abnormal postvoid residual of 121 mL. Electronically Signed   By: Rogelia Myers M.D.   On: 04/18/2024 16:00   CT Angio Chest PE W/Cm &/Or Wo Cm Result Date: 04/18/2024 CLINICAL DATA:  Pulmonary embolus suspected with high probability.  Fever developed after biliary procedure today. EXAM: CT ANGIOGRAPHY CHEST CT ABDOMEN AND PELVIS WITH CONTRAST TECHNIQUE: Multidetector CT imaging of the chest was performed using the standard protocol during bolus administration of intravenous contrast. Multiplanar CT image reconstructions and MIPs were obtained to evaluate the vascular anatomy. Multidetector CT imaging of the abdomen and pelvis was performed using the standard protocol during bolus administration of intravenous contrast. RADIATION DOSE REDUCTION: This exam was performed according to the departmental dose-optimization program which includes automated exposure control, adjustment of the mA and/or kV according to patient size and/or use of iterative reconstruction technique. CONTRAST:  OMNIPAQUE  IOHEXOL  350 MG/ML SOLN COMPARISON:  PET CT 04/12/2024.  CT chest 08/14/2020 FINDINGS: CTA CHEST FINDINGS Cardiovascular: Technically adequate study with good opacification of the central and segmental pulmonary arteries. Moderate motion artifact. No focal filling defects are demonstrated. No evidence of significant pulmonary embolus. Normal heart size. No pericardial effusions. Normal caliber thoracic aorta. No dissection. Great vessel origins are patent. Mediastinum/Nodes: No enlarged mediastinal, hilar, or axillary lymph nodes. Thyroid gland, trachea, and esophagus demonstrate no significant findings. Lungs/Pleura: Patchy peripheral infiltrates in the lungs may represent scarring or multifocal pneumonia. Similar appearance to previous study. No pleural effusion or pneumothorax. 7 mm diameter nodule in the right middle lung, series 3, image 70. Additional smaller nodule seen in both lungs. Musculoskeletal: Degenerative changes in the spine. No acute bony abnormalities. Review of the MIP images confirms the above findings. CT ABDOMEN and PELVIS FINDINGS Hepatobiliary: Residual contrast material demonstrated within mildly dilated intrahepatic bile ducts  in the left lobe. A percutaneous biliary drainage catheter is present with tip in the duodenum. No focal liver lesions are seen. Extrahepatic bile ducts are not dilated. Gas in the gallbladder is likely result of procedure. Pancreas: Unremarkable. No pancreatic ductal dilatation or surrounding inflammatory changes. Spleen: Normal in size without focal abnormality. Adrenals/Urinary Tract: Adrenal glands are unremarkable. Kidneys are normal, without renal calculi, focal lesion, or hydronephrosis. Bladder is unremarkable. Stomach/Bowel: Stomach, small bowel, and colon are not abnormally distended. No wall thickening or inflammatory changes. Appendix is not identified. Vascular/Lymphatic: Aortic atherosclerosis. No enlarged abdominal or pelvic lymph nodes. Reproductive: Prostate gland is enlarged Other: No free air or free fluid in the abdomen. No loculated collections are identified. Abdominal wall musculature appears intact. Musculoskeletal: Degenerative changes in the spine. No acute bony abnormalities. Review of the MIP images confirms the above findings. IMPRESSION: 1. Postprocedural changes in the  liver with percutaneous biliary drain in place. Tip is in the duodenum. Residual contrast material within mildly dilated left intrahepatic bile ducts. Gas in the gallbladder is also likely postprocedural. 2. No abscess or free air in the abdomen. 3. No evidence of significant pulmonary embolus. 4. Patchy peripheral infiltrates in the lungs possibly representing multifocal pneumonia or scarring. 5. Scattered nodules in the lungs, largest in the right middle lung measuring 7 mm diameter. Non-contrast chest CT at 3-6 months is recommended. If the nodules are stable at time of repeat CT, then future CT at 18-24 months (from today's scan) is considered optional for low-risk patients, but is recommended for high-risk patients. This recommendation follows the consensus statement: Guidelines for Management of Incidental  Pulmonary Nodules Detected on CT Images: From the Fleischner Society 2017; Radiology 2017; 284:228-243. 6. Aortic atherosclerosis. 7. 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 Electronically Signed   By: Elsie Gravely M.D.   On: 04/18/2024 02:13   CT ABDOMEN PELVIS W CONTRAST Result Date: 04/18/2024 CLINICAL DATA:  Pulmonary embolus suspected with high probability. Fever developed after biliary procedure today. EXAM: CT ANGIOGRAPHY CHEST CT ABDOMEN AND PELVIS WITH CONTRAST TECHNIQUE: Multidetector CT imaging of the chest was performed using the standard protocol during bolus administration of intravenous contrast. Multiplanar CT image reconstructions and MIPs were obtained to evaluate the vascular anatomy. Multidetector CT imaging of the abdomen and pelvis was performed using the standard protocol during bolus administration of intravenous contrast. RADIATION DOSE REDUCTION: This exam was performed according to the departmental dose-optimization program which includes automated exposure control, adjustment of the mA and/or kV according to patient size and/or use of iterative reconstruction technique. CONTRAST:  OMNIPAQUE  IOHEXOL  350 MG/ML SOLN COMPARISON:  PET CT 04/12/2024.  CT chest 08/14/2020 FINDINGS: CTA CHEST FINDINGS Cardiovascular: Technically adequate study with good opacification of the central and segmental pulmonary arteries. Moderate motion artifact. No focal filling defects are demonstrated. No evidence of significant pulmonary embolus. Normal heart size. No pericardial effusions. Normal caliber thoracic aorta. No dissection. Great vessel origins are patent. Mediastinum/Nodes: No enlarged mediastinal, hilar, or axillary lymph nodes. Thyroid gland, trachea, and esophagus demonstrate no significant findings. Lungs/Pleura: Patchy peripheral infiltrates in the lungs may represent scarring or multifocal pneumonia. Similar appearance to previous study. No pleural effusion or pneumothorax. 7 mm diameter nodule  in the right middle lung, series 3, image 70. Additional smaller nodule seen in both lungs. Musculoskeletal: Degenerative changes in the spine. No acute bony abnormalities. Review of the MIP images confirms the above findings. CT ABDOMEN and PELVIS FINDINGS Hepatobiliary: Residual contrast material demonstrated within mildly dilated intrahepatic bile ducts in the left lobe. A percutaneous biliary drainage catheter is present with tip in the duodenum. No focal liver lesions are seen. Extrahepatic bile ducts are not dilated. Gas in the gallbladder is likely result of procedure. Pancreas: Unremarkable. No pancreatic ductal dilatation or surrounding inflammatory changes. Spleen: Normal in size without focal abnormality. Adrenals/Urinary Tract: Adrenal glands are unremarkable. Kidneys are normal, without renal calculi, focal lesion, or hydronephrosis. Bladder is unremarkable. Stomach/Bowel: Stomach, small bowel, and colon are not abnormally distended. No wall thickening or inflammatory changes. Appendix is not identified. Vascular/Lymphatic: Aortic atherosclerosis. No enlarged abdominal or pelvic lymph nodes. Reproductive: Prostate gland is enlarged Other: No free air or free fluid in the abdomen. No loculated collections are identified. Abdominal wall musculature appears intact. Musculoskeletal: Degenerative changes in the spine. No acute bony abnormalities. Review of the MIP images confirms the above findings. IMPRESSION: 1. Postprocedural changes in the  liver with percutaneous biliary drain in place. Tip is in the duodenum. Residual contrast material within mildly dilated left intrahepatic bile ducts. Gas in the gallbladder is also likely postprocedural. 2. No abscess or free air in the abdomen. 3. No evidence of significant pulmonary embolus. 4. Patchy peripheral infiltrates in the lungs possibly representing multifocal pneumonia or scarring. 5. Scattered nodules in the lungs, largest in the right middle lung  measuring 7 mm diameter. Non-contrast chest CT at 3-6 months is recommended. If the nodules are stable at time of repeat CT, then future CT at 18-24 months (from today's scan) is considered optional for low-risk patients, but is recommended for high-risk patients. This recommendation follows the consensus statement: Guidelines for Management of Incidental Pulmonary Nodules Detected on CT Images: From the Fleischner Society 2017; Radiology 2017; 284:228-243. 6. Aortic atherosclerosis. 7. 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 Electronically Signed   By: Elsie Gravely M.D.   On: 04/18/2024 02:13   DG C-Arm 1-60 Min-No Report Result Date: 04/17/2024 Fluoroscopy was utilized by the requesting physician.  No radiographic interpretation.   NM PET Image Initial (PI) Skull Base To Thigh (F-18 FDG) Addendum Date: 04/12/2024 ADDENDUM REPORT: 04/12/2024 11:23 ADDENDUM: Additional impression. There is some asymmetric uptake in the area of the sella turcica/pituitary. Dedicated brain imaging could be performed with MRI as clinically appropriate to exclude lesion. Please correlate for any history Electronically Signed   By: Ranell Bring M.D.   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 EXAM: NUCLEAR MEDICINE PET SKULL BASE TO THIGH TECHNIQUE: 10.5 mCi F-18 FDG was injected intravenously. Full-ring PET imaging was performed from the skull base to thigh after the radiotracer. CT data was obtained and used for attenuation correction and anatomic localization. Fasting blood glucose: 108 mg/dl COMPARISON:  MRI abdomen 03/14/2024. CT abdomen pelvis 03/10/2024. CTA chest 08/14/2020. FINDINGS: Mediastinal blood pool activity: SUV max 2.5 Liver activity: SUV max 3.2 NECK: There is no specific abnormal uptake identified in the neck including along lymph node change of the submandibular, posterior triangle or internal jugular regions. Near symmetric uptake  of the visualized intracranial compartment. However there is some uptake in the area of the sella. Please correlate for any known history. Dedicated MRI could be performed clinically appropriate. Incidental CT findings: Visualized paranasal sinuses and mastoid air cells are clear. Left sided concha bullosa. The parotid glands, submandibular glands and thyroid gland are unremarkable. Scattered vascular calcifications. CHEST: No specific abnormal radiotracer uptake identified above blood pool in the axillary regions, hilum or mediastinum. There is a small focus of uptake within the left upper lobe with maximum SUV of 2.3. This is seen on PET image 38. There is no CT correlate to a nodule in this location. Diameter of uptake approaches 6 mm. There is a nodule in the middle lobe however on CT image 54 measuring 5 mm which does not show any abnormal uptake and was present on the CT of 2021 demonstrating long-term stability. Incidental CT findings: Breathing motion. There is some linear opacity lung bases likely scar or atelectasis. Some wispy ground-glass areas are identified which could be scarring or fibrotic changes based on the prior CT scan. Heart is enlarged. Scattered vascular calcifications are seen including along the coronary arteries. Patulous esophagus. ABDOMEN/PELVIS: There is a focal area of uptake which is somewhat linear in configuration with maximum SUV value of 8.0 centered at the hilum of the liver extending to the left hepatic lobe which  is in the location of the stricture seen on the previous MRI and may correspond to the area of neoplasm. There is a more subtle area of uptake identified towards the ampulla measuring maximum SUV of 6.2 but this is in the exact location of the stents. One being a right hepatic lobe PTC in the other endoscopic stent extending towards the left. This could be more artifactual as there is no clear mass lesion in this location on previous exam. Elsewhere there is  physiologic distribution radiotracer along the parenchymal organs, bowel and renal collecting systems. No clear abnormal nodal uptake identified. Incidental CT findings: S dated placement of biliary stents. The biliary ductal dilatation in the right hepatic lobe is improved. The left hepatic lobe is also slightly improved with some residual. There is atrophy of the left hepatic lobe lateral segment. Air in the nondilated gallbladder. Mild global atrophy of the pancreas. The spleen, adrenal glands are grossly preserved on this noncontrast attenuation correction CT. Renal cysts are again identified as on prior MRI. No abnormal calcifications seen within either kidney nor along the course of either ureter. Underdistended urinary bladder. Heterogeneous enlarged prostate. Large bowel has a normal course and caliber with scattered stool. Colonic diverticula identified. Stomach is underdistended. Small bowel is nondilated. Of note the cecum resides in the anterior right hemipelvis. Scattered vascular calcifications. Normal caliber aorta and IVC. Few small scattered nodes identified in the abdomen pelvis, not pathologic by size criteria. Small fat containing inguinal hernias. SKELETON: No abnormal uptake along the visualized osseous structures. Incidental CT findings: Scattered diffuse degenerative changes identified. Curvature of the spine. IMPRESSION: Focal area of uptake identified in the hilum of the liver at the location of strictures seen by previous imaging consistent with area of neoplasm. No other areas of abnormal uptake at this time suggest additional areas of disease. Indwelling right hepatic lobe PTC and endoscopic left-sided stent. Some improvement of the intrahepatic biliary ductal dilatation with residual particularly along the left hepatic lobe. Left hepatic lobe is also atrophic. There is a small focus of asymmetric uptake along the left upper lobe of the lung without a corresponding lung nodule on CT  scan. In the setting recommend a dedicated high-resolution CT scan for further spacer resolution to see if there is an underlying lesion versus a follow up CT in 3 months. Residual areas of scarring and fibrotic changes in a similar distribution to the abnormality on prior remote CT scan of the chest. There is a right-sided lung nodule which is stable since 2021 has no abnormal uptake. No additional follow-up of this specific nodule. Electronically Signed: By: Ranell Bring M.D. On: 04/12/2024 10:38    Labs:  CBC: Recent Labs    04/17/24 2330 04/18/24 0905 04/19/24 0500 04/20/24 0423  WBC 18.7* 17.1* 17.2* 9.3  HGB 13.2 10.9* 11.1* 11.4*  HCT 38.5* 32.4* 32.8* 34.0*  PLT 196 157 153 139*    COAGS: Recent Labs    03/16/24 0923  INR 1.0    BMP: Recent Labs    04/18/24 0905 04/18/24 1821 04/19/24 0500 04/20/24 0423  NA 140 139 140 137  K 2.8* 4.2 3.9 3.2*  CL 112* 114* 113* 104  CO2 20* 16* 19* 24  GLUCOSE 129* 196* 101* 121*  BUN 33* 25* 24* 21  CALCIUM  7.8* 7.8* 8.0* 7.9*  CREATININE 1.68* 1.22 1.20 1.28*  GFRNONAA 42* >60 >60 58*    LIVER FUNCTION TESTS: Recent Labs    04/17/24 2330 04/18/24 0905 04/19/24 0500  04/20/24 0423  BILITOT 1.8* 2.3* 1.7* 1.5*  AST 33 27 23 21   ALT 85* 59* 57* 40  ALKPHOS 114 84 74 79  PROT 7.8 5.3* 5.8* 5.6*  ALBUMIN 3.2* 2.2* 2.3* 2.2*    TUMOR MARKERS: No results for input(s): AFPTM, CEA, CA199, CHROMGRNA in the last 8760 hours.  Assessment and Plan:  Biliary stricture: NASRI BOAKYE is a 79 y.o. male with a history of biliary drain placement on 6/5 with Dr. DELENA Balder secondary to common bile duct stricture with elevated LFTs. Patient presented to ED following ERCP on 7/7 and was found to be septic with multifocal pneumonia. IR was consulted for possible biliary drain exchange with possible brush biopsy.  -Patient continues to be febrile, tachypneic, and feel weak -After multiple discussions with patient and  family, IR will hold on any procedure at this time -Per patient, if he is feeling stronger later during his admission, he is open to reconsidering the procedure. Patient is also amenable to following up as an outpatient for the procedure -This plan has been relayed to the Hospital team  -IR will delete the order. Should the team wish to pursue the procedure later in his admission, please place a new consult order and IR will be happy to assist.   Thank you for this interesting consult. I greatly enjoyed meeting WILGUS DEYTON and look forward to participating in their care. A copy of this report was sent to the requesting provider on this date.  Electronically Signed: Glennon CHRISTELLA Bal, PA-C 04/20/2024, 5:01 PM   I spent a total of 55 Miinutes in face to face clinical consultation, greater than 50% of which was counseling/coordinating care for biliary drain care.

## 2024-04-20 NOTE — Telephone Encounter (Signed)
 3 month ercp has been scheduled

## 2024-04-20 NOTE — Progress Notes (Signed)
 [facility] Progress Note  Patient: Casey Peters    FMW:[id number]  DOA: 04/17/2024     Date of Service: the patient was seen and examined on 04/20/2024  Chief Complaint  Patient presents with   Post-op Problem   Brief hospital course: CONSTANTIN HILLERY is a 78 y.o. male with medical history significant for dilated cardiomyopathy, CAD RCA stent in 2023, HTN, HLD, IIDM, BPH with history of recurrent UTIs on suppressive antibiotics, recently hospitalized with painless jaundice, s/p ERCP  with biliary stent placement in 03/15/24, exchanged yesterday 04/17/24, with brushings sent to cytology who is being admitted for sepsis secondary to multifocal pneumonia.  History is given by wife at the bedside who states that he has been weak and staying in bed since his hospitalization a month ago when he had the first stent placed, however yesterday several hours after his procedure he developed a cough and a fever which prompted the visit to the ED.  She states he has had nausea but without vomiting and has ongoing abdominal pain for over a month which has not changed.  Patient appears very frail, lethargic and unable to contribute to history. In the ED tachycardic to the 140s to 150s and tachypneic to the 30s.  BP initially 107/63 but improving to the 160s following hydration.  O2 sat initially in the 90s on room air but desatted down to as low as 62 requiring 5 L O2 via nasal cannula to maintain sats in the high 90s. Labs notable for WBC 18,000 with lactic acid 1.3-->2.3.  Creatinine 1.45 up from baseline of 0.76 a month ago.  LFTs reassuring compared to prior.  BNP normal.  EKG with sinus tachycardia at 147 CT abdomen and pelvis showing postsurgical changes with drains in place and no acute findings.  And CTA PE protocol negative for PE but showing patchy peripheral infiltrates possibly representing multifocal pneumonia. Patient given sepsis fluid bolus and started on Zosyn  and vancomycin  for possible  intra-abdominal source. Admission requested    Assessment and Plan:  # Septic shock secondary to E. coli bacteremia and HCAP S/p 3 L fluid bolus given 7/8 S/p Levophed  given for 6 hours then patient was transferred to ICU, downgraded in the evening to [facility] service on 7/8 Continue maintenance IV fluid Monitor for fluid overload in view of history of HFrEF S/p cefepime , Flagyl  and vancomycin . ID consulted, continued Flagyl  and started ceftriaxone  due to E. coli bacteremia.  Discontinued vancomycin  and cefepime . Pharmacy consulted for dosing Blood culture growing E. 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 S/p ERCP 03/15/2024 and 04/17/2024, brush biopsy sent for cytology.  No acute procedural complications suspected. Cytology : Few atypical cells, favor reactive CT abdomen and pelvis nonacute GI consulted to follow 7/9 consulted IR for biliary drain exchange and repeat brush biopsy for adequate sampling to rule out malignancy 7/10 patient was feeling weak so he refused IR for the procedure today, patient was consulted and we will get it done tomorrow a.m. if he feels stable   # Acute respiratory failure with hypoxia # HCAP (healthcare-associated pneumonia) Sepsis criteria include fever, tachycardia and tachypnea with leukocytosis and lactic acidosis with multifocal pneumonia Possible aspiration during sedation for procedure given timing of onset of symptoms Patient was hypoxic to 62% and tachypneic to the 30s requiring  5 L to maintain sats in the high 90s S/p cefepime  vancomycin  and Flagyl , Sepsis fluids Protonix SLP consult Patient is at high risk of clinical deterioration Continue antibiotics as above as per ID    # AKI (acute kidney injury) due to septic shock Expecting  improvement with IV fluid resuscitation Monitor renal function and avoid nephrotoxins  # Hypokalemia, potassium repleted. # Hypomagnesemia, mag repleted. # Hypophosphatemia, Phos repleted. Monitor electrolytes and replete as needed.  # Metabolic acidosis Bicarb 100 mEq IV one-time dose given Monitor BMP daily    # h/o STEMI S/p cath with stent to RCA.  Held home meds Resume aspirin  when stable after the procedure Troponin 89 possible demand ischemia f/u Repeat TTE    # HFrEF (heart failure with reduced ejection fraction) (HCC) Dilated idiopathic cardiomyopathy (HCC) Hold carvedilol , spironolactone  in the setting of sepsis Daily weights   # Controlled type 2 diabetes mellitus without complication  Sliding scale insulin  coverage   # Pulmonary nodule, incidental finding. CT chest: Scattered nodules in the lungs, largest in the right middle lung measuring 7 mm diameter. Non-contrast chest CT at 3-6 months is recommended. If the nodules are stable at time of repeat CT, then future CT at 18-24 months (from today's scan) is considered optional for low-risk patients, but is recommended for high-risk patients. Recommended to follow with PCP and pulmonary as an outpatient. Patient is already following oncologist  # Vitamin D  deficiency: started vitamin D  50,000 units p.o. weekly, follow with PCP to repeat vitamin D  level after 3 to 6 months.   Body mass index is 27.92 kg/m.  Interventions:  Diet: CLD advance as per tolerance DVT Prophylaxis: Subcutaneous Heparin     Advance goals of care discussion: DNR/DNI-limited  Family Communication: family was present at bedside, at the time of interview.  The pt provided permission to discuss medical plan with the family. Opportunity was given to ask question and all questions were answered satisfactorily.   Disposition:  Pt is from Home, admitted with sepsis, resp failure, PNA, still on IV antibiotics, transferred to ICU, which precludes  a safe discharge. Discharge to Home, when stable, may need few days to improve.  Subjective: No significant events overnight.  In the morning time patient was feeling little bit weak, no any specific complaints, denied any chest pain or palpitation, no abdominal pain.    Physical Exam: General: NAD, lying comfortably Appear in no distress, affect appropriate Eyes: PERRLA ENT: Oral Mucosa Clear, moist  Neck: no JVD,  Cardiovascular: S1 and S2 Present, no Murmur,  Respiratory: Equal air entry bilaterally, mild bibasilar crackles, no wheezes  Abdomen: BS present, Soft and TD, biliary drain intact Skin: no rashes Extremities: no Pedal edema, no calf tenderness Neurologic: without any new focal findings Gait not checked due to patient safety concerns  Vitals:   04/20/24 0940 04/20/24 1000 04/20/24 1100 04/20/24 1200  BP: 115/60 137/73 134/73 130/75  Pulse: 84 86 86 84  Resp: (!) 28 (!) 32 (!) 25 (!) 23  Temp:    99 F (37.2 C)  TempSrc:    Oral  SpO2: 99% 99% 99% 98%  Weight:      Height:        Intake/Output Summary (Last 24 hours) at 04/20/2024 1447 Last data filed at 04/20/2024 1101 Gross per 24 hour  Intake 573.72 ml  Output 4150 ml  Net -3576.28 ml   Filed Weights   04/17/24 2325 04/19/24 0510  Weight: 84.8 kg 90.8 kg  Data Reviewed: I have personally reviewed and interpreted daily labs, tele strips, imagings as discussed above. I reviewed all nursing notes, pharmacy notes, vitals, pertinent old records I have discussed plan of care as described above with RN and patient/family.  CBC: Recent Labs  Lab 04/17/24 2330 04/18/24 0905 04/19/24 0500 04/20/24 0423  WBC 18.7* 17.1* 17.2* 9.3  NEUTROABS 15.8*  --   --   --   HGB 13.2 10.9* 11.1* 11.4*  HCT 38.5* 32.4* 32.8* 34.0*  MCV 94.4 95.0 94.5 93.2  PLT 196 157 153 139*   Basic Metabolic Panel: Recent Labs  Lab 04/17/24 2330 04/18/24 0522 04/18/24 0905 04/18/24 1821 04/19/24 0500 04/20/24 0423  NA  136  --  140 139 140 137  K 4.0  --  2.8* 4.2 3.9 3.2*  CL 106  --  112* 114* 113* 104  CO2 18*  --  20* 16* 19* 24  GLUCOSE 192*  --  129* 196* 101* 121*  BUN 34*  --  33* 25* 24* 21  CREATININE 1.45* 1.44* 1.68* 1.22 1.20 1.28*  CALCIUM  8.8*  --  7.8* 7.8* 8.0* 7.9*  MG  --   --  1.4* 1.9 1.8 1.6*  PHOS  --   --  2.2* 3.8 2.6 2.4*    Studies: No results found.   Scheduled Meds:  Chlorhexidine  Gluconate Cloth  6 each Topical Daily   feeding supplement (GLUCERNA SHAKE)  237 mL Oral TID BM   guaiFENesin   600 mg Oral BID   heparin  injection (subcutaneous)  5,000 Units Subcutaneous Q8H   insulin  aspart  0-9 Units Subcutaneous TID AC & HS   [START ON 04/21/2024] metroNIDAZOLE   500 mg Oral Q12H   phosphorus  500 mg Oral QID   Vitamin D  (Ergocalciferol )  50,000 Units Oral Q7 days   Continuous Infusions:  cefTRIAXone  (ROCEPHIN )  IV Stopped (04/19/24 1739)   metronidazole  Stopped (04/20/24 9366)   PRN Meds: acetaminophen  **OR** acetaminophen , albuterol , ondansetron  **OR** ondansetron  (ZOFRAN ) IV  Time spent: 55 minutes  Author: ELVAN SOR. MD [facility] 04/20/2024 2:47 PM  To reach On-call, see care teams to locate the attending and reach out to them via [email or web address]. If 7PM-7AM, please contact night-coverage If you still have difficulty reaching the attending provider, please page the [facility] (Director on Call) for [facility] on amion for assistance.

## 2024-04-20 NOTE — Plan of Care (Signed)

## 2024-04-20 NOTE — Progress Notes (Signed)
 Date of Admission:  04/17/2024    ID: Casey Peters is a 78 y.o. male  Principal Problem:   HCAP (healthcare-associated pneumonia) Active Problems:   Biliary stenosis   Severe sepsis (HCC)   Acute respiratory failure with hypoxia (HCC)   AKI (acute kidney injury) (HCC)   Septic shock (HCC)   E coli bacteremia    Subjective: Fever 103  Medications:   Chlorhexidine  Gluconate Cloth  6 each Topical Daily   guaiFENesin   600 mg Oral BID   heparin  injection (subcutaneous)  5,000 Units Subcutaneous Q8H   insulin  aspart  0-9 Units Subcutaneous Q4H   [START ON 04/21/2024] metroNIDAZOLE   500 mg Oral Q12H   phosphorus  500 mg Oral QID   Vitamin D  (Ergocalciferol )  50,000 Units Oral Q7 days    Objective: Vital signs in last 24 hours: Patient Vitals for the past 24 hrs:  BP Temp Temp src Pulse Resp SpO2  04/20/24 1200 130/75 99 F (37.2 C) Oral 84 (!) 23 98 %  04/20/24 1100 134/73 -- -- 86 (!) 25 99 %  04/20/24 1000 137/73 -- -- 86 (!) 32 99 %  04/20/24 0940 115/60 -- -- 84 (!) 28 99 %  04/20/24 0919 -- 99.6 F (37.6 C) Oral -- -- --  04/20/24 0900 -- -- -- 85 (!) 24 98 %  04/20/24 0800 (!) 141/73 -- -- 93 (!) 33 100 %  04/20/24 0730 -- 99.7 F (37.6 C) Oral 91 -- 100 %  04/20/24 0715 -- -- -- 87 -- 98 %  04/20/24 0700 117/64 -- -- 87 (!) 28 98 %  04/20/24 0645 -- -- -- 88 -- 97 %  04/20/24 0630 -- -- -- 90 -- 96 %  04/20/24 0615 -- -- -- 88 -- 97 %  04/20/24 0600 120/68 -- -- 85 -- 97 %  04/20/24 0545 -- -- -- 82 -- 99 %  04/20/24 0530 -- -- -- 81 -- 99 %  04/20/24 0515 -- -- -- 79 -- 99 %  04/20/24 0500 (!) 101/56 -- -- 78 -- 100 %  04/20/24 0445 -- -- -- 80 -- 100 %  04/20/24 0430 -- -- -- 76 -- 99 %  04/20/24 0426 -- 98.3 F (36.8 C) Oral 82 -- 100 %  04/20/24 0415 -- -- -- 80 -- 100 %  04/20/24 0400 (!) 106/55 -- -- 76 -- 100 %  04/20/24 0100 99/62 -- -- 79 (!) 23 97 %  04/20/24 0000 121/89 -- -- 89 (!) 28 98 %  04/19/24 2210 101/63 99.8 F (37.7 C) Oral 94  (!) 25 97 %  04/19/24 2100 (!) 121/58 -- -- 91 (!) 32 100 %  04/19/24 1935 -- 99.6 F (37.6 C) Oral 99 (!) 29 95 %  04/19/24 1800 120/61 -- -- 95 (!) 35 96 %  04/19/24 1700 123/62 -- -- 93 (!) 33 97 %  04/19/24 1600 125/60 99.5 F (37.5 C) Oral 94 (!) 34 97 %  04/19/24 1500 123/68 -- -- 99 (!) 30 97 %  04/19/24 1400 124/81 -- -- 96 (!) 33 99 %      PHYSICAL EXAM:  General: lethargic Lungs: Clear to auscultation bilaterally. No Wheezing or Rhonchi. No rales. Heart: Regular rate and rhythm, no murmur, rub or gallop. Abdomen: Soft, rt perc biliary drain Extremities: atraumatic, no cyanosis. No edema. No clubbing Skin: No rashes or lesions. Or bruising Lymph: Cervical, supraclavicular normal. Neurologic: Grossly non-focal  Lab Results    Latest  Ref Rng & Units 04/20/2024    4:23 AM 04/19/2024    5:00 AM 04/18/2024    9:05 AM  CBC  WBC 4.0 - 10.5 K/uL 9.3  17.2  17.1   Hemoglobin 13.0 - 17.0 g/dL 88.5  88.8  89.0   Hematocrit 39.0 - 52.0 % 34.0  32.8  32.4   Platelets 150 - 400 K/uL 139  153  157        Latest Ref Rng & Units 04/20/2024    4:23 AM 04/19/2024    5:00 AM 04/18/2024    6:21 PM  CMP  Glucose 70 - 99 mg/dL 878  898  803   BUN 8 - 23 mg/dL 21  24  25    Creatinine 0.61 - 1.24 mg/dL 8.71  8.79  8.77   Sodium 135 - 145 mmol/L 137  140  139   Potassium 3.5 - 5.1 mmol/L 3.2  3.9  4.2   Chloride 98 - 111 mmol/L 104  113  114   CO2 22 - 32 mmol/L 24  19  16    Calcium  8.9 - 10.3 mg/dL 7.9  8.0  7.8   Total Protein 6.5 - 8.1 g/dL 5.6  5.8    Total Bilirubin 0.0 - 1.2 mg/dL 1.5  1.7    Alkaline Phos 38 - 126 U/L 79  74    AST 15 - 41 U/L 21  23    ALT 0 - 44 U/L 40  57        Microbiology:  Studies/Results: US  RENAL Result Date: 04/18/2024 CLINICAL DATA:  [id number] AKI (acute kidney injury) (HCC) [id number] EXAM: RENAL / URINARY TRACT ULTRASOUND COMPLETE COMPARISON:  Mar 10, 2024 FINDINGS: Right Kidney: Renal measurements: 13.1 x 6.3 x 10.3 cm = volume: 273 mL. Normal  echogenicity. 1.2 x 0.8 x 0.9 cm cyst in the upper pole. No hydronephrosis or nephrolithiasis. Left Kidney: Renal measurements: 12 x 5.4 x 5.3 cm = volume: 181 mL. Normal echogenicity. A couple of small cysts in the left kidney measuring up to 1.6 x 1.4 x 1.6 cm. No hydronephrosis or nephrolithiasis. Bladder: Circumferential wall thickening with trabeculation. Abnormal postvoid residual of 121 mL. Other: None. IMPRESSION: 1. No hydronephrosis or nephrolithiasis. 2. Circumferential wall thickening of the urinary bladder, which in the setting of prostatomegaly, may represent changes of chronic bladder outlet obstruction. If there is concern for acute cystitis, correlation with urinalysis would be recommended. 3. Abnormal postvoid residual of 121 mL. Electronically Signed   By: Rogelia Myers M.D.   On: 04/18/2024 16:00     Assessment/Plan: 78 year old male with history of biliary obstruction E. coli bacteremia with sepsis following ERCP done on monday On  ceftriaxone  Had fever of 103- will boraden coverage to zosyn  Repeat blood culture sent today ? Biliary obstruction.  Status post ERCP and stent replacement. Also has biliary drain Brushings have been sent andcytology shows reactive cells Increased CA 19-9    CAD status post CABG   Diabetes mellitus   Hypertension   AKI secondary to the infection   History of urethral strictures with frequent UTIs.  Was on suppressive therapy with nitrofurantoin    Discussed the management with the patient and his son at bedside and with hospitalist

## 2024-04-20 NOTE — Progress Notes (Signed)
 OT Cancellation Note  Patient Details Name: Casey Peters MRN: [id number] DOB: 02-23-1946   Cancelled Treatment:    Reason Eval/Treat Not Completed: Fatigue/lethargy limiting ability to participate;Patient declined, no reason specified. Upon attempt, pt endorsing weakness and fatigue, requesting therapy to return the next date. Assist for repositioning for comfort in bed.   Serge Main R., MPH, MS, OTR/L ascom [phone] 04/20/24, 3:52 PM

## 2024-04-20 NOTE — Progress Notes (Signed)
 Home medications have been brought in by wife and medications are to be taken down to pharmacy administration.

## 2024-04-21 DIAGNOSIS — B9620 Unspecified Escherichia coli [E. coli] as the cause of diseases classified elsewhere: Secondary | ICD-10-CM | POA: Diagnosis not present

## 2024-04-21 DIAGNOSIS — J189 Pneumonia, unspecified organism: Secondary | ICD-10-CM | POA: Diagnosis not present

## 2024-04-21 DIAGNOSIS — K831 Obstruction of bile duct: Secondary | ICD-10-CM | POA: Diagnosis not present

## 2024-04-21 DIAGNOSIS — R7881 Bacteremia: Secondary | ICD-10-CM | POA: Diagnosis not present

## 2024-04-21 LAB — BASIC METABOLIC PANEL WITH GFR
Anion gap: 10 (ref 5–15)
BUN: 17 mg/dL (ref 8–23)
CO2: 26 mmol/L (ref 22–32)
Calcium: 7.8 mg/dL — ABNORMAL LOW (ref 8.9–10.3)
Chloride: 101 mmol/L (ref 98–111)
Creatinine, Ser: 1.1 mg/dL (ref 0.61–1.24)
GFR, Estimated: >60 mL/min (ref >60)
Glucose, Bld: 142 mg/dL — ABNORMAL HIGH (ref 70–99)
Potassium: 3.4 mmol/L — ABNORMAL LOW (ref 3.5–5.1)
Sodium: 137 mmol/L (ref 135–145)

## 2024-04-21 LAB — AMYLASE: Amylase: 45 U/L (ref 28–100)

## 2024-04-21 LAB — CBC
HCT: 33.8 % — ABNORMAL LOW (ref 39.0–52.0)
Hemoglobin: 11.6 g/dL — ABNORMAL LOW (ref 13.0–17.0)
MCH: 31.4 pg (ref 26.0–34.0)
MCHC: 34.3 g/dL (ref 30.0–36.0)
MCV: 91.4 fL (ref 80.0–100.0)
Platelets: 142 K/uL — ABNORMAL LOW (ref 150–400)
RBC: 3.7 MIL/uL — ABNORMAL LOW (ref 4.22–5.81)
RDW: 15.2 % (ref 11.5–15.5)
WBC: 6.4 K/uL (ref 4.0–10.5)
nRBC: 0 % (ref 0.0–0.2)

## 2024-04-21 LAB — HEPATIC FUNCTION PANEL
ALT: 35 U/L (ref 0–44)
AST: 25 U/L (ref 15–41)
Albumin: 2.3 g/dL — ABNORMAL LOW (ref 3.5–5.0)
Alkaline Phosphatase: 76 U/L (ref 38–126)
Bilirubin, Direct: 0.6 mg/dL — ABNORMAL HIGH (ref 0.0–0.2)
Indirect Bilirubin: 0.6 mg/dL (ref 0.3–0.9)
Total Bilirubin: 1.2 mg/dL (ref 0.0–1.2)
Total Protein: 5.8 g/dL — ABNORMAL LOW (ref 6.5–8.1)

## 2024-04-21 LAB — GLUCOSE, CAPILLARY
Glucose-Capillary: 133 mg/dL — ABNORMAL HIGH (ref 70–99)
Glucose-Capillary: 170 mg/dL — ABNORMAL HIGH (ref 70–99)
Glucose-Capillary: 148 mg/dL — ABNORMAL HIGH (ref 70–99)

## 2024-04-21 LAB — LIPASE, BLOOD: Lipase: 60 U/L — ABNORMAL HIGH (ref 11–51)

## 2024-04-21 LAB — PHOSPHORUS: Phosphorus: 2.1 mg/dL — ABNORMAL LOW (ref 2.5–4.6)

## 2024-04-21 LAB — MAGNESIUM: Magnesium: 1.9 mg/dL (ref 1.7–2.4)

## 2024-04-21 MED ORDER — GUAIFENESIN-DM 100-10 MG/5ML PO SYRP: 5.0000 mL | ORAL_SOLUTION | ORAL | Status: DC | PRN

## 2024-04-21 MED ORDER — ENOXAPARIN SODIUM 40 MG/0.4ML IJ SOSY
40.0000 mg | PREFILLED_SYRINGE | Freq: Every day | INTRAMUSCULAR | Status: DC
  Administered 2024-04-21 – 2024-04-23 (×3): 40 mg via SUBCUTANEOUS
  Filled 2024-04-21 (×3): qty 0.4

## 2024-04-21 MED ORDER — K PHOS MONO-SOD PHOS DI & MONO 155-852-130 MG PO TABS
500.0000 mg | ORAL_TABLET | Freq: Three times a day (TID) | ORAL | Status: AC
  Administered 2024-04-21 (×3): 500 mg via ORAL
  Filled 2024-04-21 (×3): qty 2

## 2024-04-21 MED ORDER — POTASSIUM CHLORIDE CRYS ER 20 MEQ PO TBCR
40.0000 meq | EXTENDED_RELEASE_TABLET | Freq: Once | ORAL | Status: AC
  Administered 2024-04-21: 40 meq via ORAL
  Filled 2024-04-21: qty 2

## 2024-04-21 NOTE — Progress Notes (Signed)
 Physical Therapy Treatment Patient Details Name: Casey Peters MRN: [id number] DOB: [birth year] Today's Date: 04/21/2024   History of Present Illness Casey Peters is Casey 78 y.o. male with medical history significant for dilated cardiomyopathy, CAD RCA stent in 2023, HTN, HLD, IIDM, BPH with history of recurrent UTIs on suppressive antibiotics, recently hospitalized with painless jaundice, s/p ERCP  with biliary stent placement in 03/15/24, exchanged yesterday 04/17/24, with brushings sent to cytology who is being admitted for sepsis secondary to multifocal pneumonia.    PT Comments  Patient sidelying in bed on arrival and agreeable to PT session. Patient with flat affect throughout session. Able to complete bed mobility with minA. Stood from EOB with CGA then ambulated ~60' with CGA-minA and RW. Patient with occasional scissoring gait. Limited by fatigue. Discharge plan remains appropriate. Hopeful to progress to discharge home with therapy follow up.     If plan is discharge home, recommend the following: Casey little help with walking and/or transfers;Casey little help with bathing/dressing/bathroom;Assistance with cooking/housework;Help with stairs or ramp for entrance;Assist for transportation   Can travel by private vehicle     No  Equipment Recommendations  Rolling Marybel Alcott (2 wheels);BSC/3in1;Wheelchair (measurements PT);Wheelchair cushion (measurements PT)    Recommendations for Other Services       Precautions / Restrictions Precautions Precautions: Fall Recall of Precautions/Restrictions: Intact Restrictions Weight Bearing Restrictions Per Provider Order: No     Mobility  Bed Mobility Overal bed mobility: Needs Assistance Bed Mobility: Supine to Sit     Supine to sit: Min assist, HOB elevated (+HHA)          Transfers Overall transfer level: Needs assistance Equipment used: Rolling Mearl Harewood (2 wheels) Transfers: Sit to/from Stand Sit to Stand: Contact guard assist                 Ambulation/Gait Ambulation/Gait assistance: Contact guard assist, Min assist Gait Distance (Feet): 60 Feet Assistive device: Rolling Paiton B[place]ting[place] (2 wheels) Gait Pattern/deviations: Step-through pattern, Decreased stride length, Scissoring Gait velocity: decreased     General Gait Details: mild scissoring noted with ambulation. LOB x 1 during mobility   Stairs             Wheelchair Mobility     Tilt Bed    Modified Rankin (Stroke Patients Only)       Balance Overall balance assessment: Needs assistance Sitting-balance support: Feet supported Sitting balance-Leahy Scale: Fair     Standing balance support: Bilateral upper extremity supported, Reliant on assistive device for balance Standing balance-Leahy Scale: Fair                              Hotel manager: No apparent difficulties  Cognition Arousal: Alert Behavior During Therapy: WFL for tasks assessed/performed, Flat affect   PT - Cognitive impairments: No apparent impairments                         Following commands: Intact      Cueing Cueing Techniques: Verbal cues, Gestural cues  Exercises      General Comments General comments (skin integrity, edema, etc.): VSS on RA      Pertinent Vitals/Pain Pain Assessment Pain Assessment: No/denies pain    Home Living Family/patient expects to be discharged to:: Private residence Living Arrangements: Spouse/significant other Available Help at Discharge: Family Type of Home: House Home Access: Stairs to enter   [employer] of Steps: 2  Home Layout: One level Home Equipment: Agricultural consultant (2 wheels);Toilet riser Additional Comments: lives with wife, daughter staying with them for Casey couple of days after d/c    Prior Function            PT Goals (current goals can now be found in the care plan section) Acute Rehab PT Goals PT Goal Formulation: With  patient/family Time For Goal Achievement: 05/03/24 Potential to Achieve Goals: Good Progress towards PT goals: Progressing toward goals    Frequency    Min 2X/week      PT Plan      Co-evaluation PT/OT/SLP Co-Evaluation/Treatment: Yes Reason for Co-Treatment: For patient/therapist safety;To address functional/ADL transfers PT goals addressed during session: Mobility/safety with mobility;Balance OT goals addressed during session: ADL's and self-care      AM-PAC PT 6 Clicks Mobility   Outcome Measure  Help needed turning from your back to your side while in Casey flat bed without using bedrails?: Casey Little Help needed moving from lying on your back to sitting on the side of Casey flat bed without using bedrails?: Casey Little Help needed moving to and from Casey bed to Casey chair (including Casey wheelchair)?: Casey Little Help needed standing up from Casey chair using your arms (e.g., wheelchair or bedside chair)?: Casey Little Help needed to walk in hospital room?: Casey Little Help needed climbing 3-5 steps with Casey railing? : Casey Lot 6 Click Score: 17    End of Session Equipment Utilized During Treatment: Gait belt Activity Tolerance: Patient tolerated treatment well Patient left: in chair;with call bell/phone within reach;with family/visitor present Nurse Communication: Mobility status PT Visit Diagnosis: Unsteadiness on feet (R26.81);Muscle weakness (generalized) (M62.81);History of falling (Z91.81);Other abnormalities of gait and mobility (R26.89)     Time: 8941-8885 PT Time Calculation (min) (ACUTE ONLY): 16 min  Charges:    $Therapeutic Activity: 8-22 mins PT General Charges $$ ACUTE PT VISIT: 1 Visit                     Casey Peters, PT, DPT Physical Therapist - [facility]  [facility]    Casey Peters Casey Peters 04/21/2024, 1:23 PM

## 2024-04-21 NOTE — Progress Notes (Signed)
 Progress Note   Patient: Casey Peters FMW:[id number] DOB: 02/26/1946 DOA: 04/17/2024     3 DOS: the patient was seen and examined on 04/21/2024   Brief hospital course: Casey Peters is a 78 y.o. male with medical history significant for dilated cardiomyopathy, CAD RCA stent in 2023, HTN, HLD, IIDM, BPH with history of recurrent UTIs on suppressive antibiotics, recently hospitalized with painless jaundice, s/p ERCP  with biliary stent placement in 03/15/24, exchanged yesterday 04/17/24, with brushings sent to cytology who is being admitted for sepsis secondary to multifocal pneumonia.  History is given by wife at the bedside who states that he has been weak and staying in bed since his hospitalization a month ago when he had the first stent placed, however yesterday several hours after his procedure he developed a cough and a fever which prompted the visit to the ED.  She states he has had nausea but without vomiting and has ongoing abdominal pain for over a month which has not changed.  Patient appears very frail, lethargic and unable to contribute to history. In the ED tachycardic to the 140s to 150s and tachypneic to the 30s.  BP initially 107/63 but improving to the 160s following hydration.  O2 sat initially in the 90s on room air but desatted down to as low as 62 requiring 5 L O2 via nasal cannula to maintain sats in the high 90s. Labs notable for WBC 18,000 with lactic acid 1.3-->2.3.  Creatinine 1.45 up from baseline of 0.76 a month ago.  LFTs reassuring compared to prior.  BNP normal.  EKG with sinus tachycardia at 147 CT abdomen and pelvis showing postsurgical changes with drains in place and no acute findings.  And CTA PE protocol negative for PE but showing patchy peripheral infiltrates possibly representing multifocal pneumonia. Patient given sepsis fluid bolus and started on Zosyn  and vancomycin  for possible intra-abdominal source. Admission requested   Assessment and Plan:  #  Septic shock secondary to E. coli bacteremia and HCAP S/p 3 L fluid bolus given 7/8 S/p Levophed  given for 6 hours then patient was transferred to ICU, downgraded in the evening to [facility] service on 7/8 Continue maintenance IV fluid Monitor for fluid overload in view of history of HFrEF S/p cefepime , Flagyl  and vancomycin . ID consulted, continued Flagyl  and started ceftriaxone  due to E. 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 S/p ERCP 03/15/2024 and 04/17/2024, brush biopsy sent for cytology.  No acute procedural complications suspected. Cytology : Few atypical cells, favor reactive CT abdomen and pelvis nonacute GI consulted to follow 7/9 consulted IR for biliary drain exchange and repeat brush biopsy for adequate sampling to rule out malignancy 7/10 patient was feeling weak so he refused IR for the procedure today, patient was consulted and we will get it done tomorrow a.m. if he feels stable 7/11 Patient continues to decline procedure for now     # Acute respiratory failure with hypoxia # HCAP (healthcare-associated pneumonia) Sepsis criteria include fever, tachycardia and tachypnea with leukocytosis and lactic acidosis with multifocal pneumonia Possible aspiration during sedation for procedure given timing of onset of symptoms Patient was hypoxic to 62% and tachypneic to the 30s requiring  5 L to maintain sats in the high 90s S/p cefepime  vancomycin  and Flagyl , Sepsis fluids Protonix  SLP consult Patient is at high risk of clinical deterioration Continue IV Zosyn  per ID recommendation    # AKI (acute kidney injury) due to septic shock Expecting improvement with IV fluid resuscitation Monitor renal  function and avoid nephrotoxins   # Hypokalemia, potassium repleted. # Hypomagnesemia, mag repleted. # Hypophosphatemia, Phos repleted. Monitor electrolytes and replete as needed.   # Metabolic acidosis Bicarb 100 mEq IV one-time dose given Monitor BMP daily     # h/o STEMI S/p cath with stent to RCA.  Held home meds Resume aspirin  when stable after the procedure Troponin 89 possible demand ischemia f/u Repeat TTE     # HFrEF (heart failure with reduced ejection fraction) (HCC) Dilated idiopathic cardiomyopathy (HCC) Hold carvedilol , spironolactone  in the setting of sepsis Daily weights   # Controlled type 2 diabetes mellitus without complication  Sliding scale insulin  coverage   # Pulmonary nodule, incidental finding. CT chest: Scattered nodules in the lungs, largest in the right middle lung measuring 7 mm diameter. Non-contrast chest CT at 3-6 months is recommended. If the nodules are stable at time of repeat CT, then future CT at 18-24 months (from today's scan) is considered optional for low-risk patients, but is recommended for high-risk patients. Recommended to follow with PCP and pulmonary as an outpatient. Patient is already following oncologist   # Vitamin D  deficiency: started vitamin D  50,000 units p.o. weekly, follow with PCP to repeat vitamin D  level after 3 to 6 months.     Body mass index is 27.92 kg/m.  Interventions:        Subjective: Sleeping but arouses easily.  Physical Exam: Vitals:   04/21/24 1200 04/21/24 1300 04/21/24 1400 04/21/24 1500  BP: 115/79  122/71   Pulse: 78 92 (!) 138 76  Resp: (!) 21 (!) 22 (!) 40   Temp:      TempSrc:      SpO2: 97% 98% 93% 97%  Weight:      Height:       General: Sleeping but arouses easily Lungs: Clear to auscultation bilaterally.  Heart: Regular rate and rhythm, no murmur, rub or gallop. Abdomen: Soft, rt perc biliary drain Extremities: atraumatic, no cyanosis. No edema. No clubbing Skin: No rashes  or lesions. Or bruising Lymph: Cervical, supraclavicular normal. Neurologic: Grossly non-focal, weak   Data Reviewed: Lipase 60, potassium 3.4, phosphorus 2.1 Labs reviewed  Family Communication: Plan of care discussed with patient at the bedside.  He verbalizes understanding and agrees with the plan  Disposition: Status is: Inpatient Remains inpatient appropriate because: On IV antibiotics  Planned Discharge Destination: TBD    Time spent:  45  minutes  Author: Aimee Somerset, MD 04/21/2024 3:25 PM  For on call review [email or web address].

## 2024-04-21 NOTE — Plan of Care (Signed)

## 2024-04-21 NOTE — Progress Notes (Signed)
 Occupational Therapy Evaluation Patient Details Name: Casey Peters MRN: [id number] DOB: 22-Feb-1946 Today's Date: 04/21/2024   History of Present Illness   Casey Peters is a 78 y.o. male with medical history significant for dilated cardiomyopathy, CAD RCA stent in 2023, HTN, HLD, IIDM, BPH with history of recurrent UTIs on suppressive antibiotics, recently hospitalized with painless jaundice, s/p ERCP  with biliary stent placement in 03/15/24, exchanged yesterday 04/17/24, with brushings sent to cytology who is being admitted for sepsis secondary to multifocal pneumonia.     Clinical Impressions Casey Peters was seen for OT evaluation this date. Prior to hospital admission, pt was IND in ADLs. Pt lives w/ spouse. Pt presents to acute OT demonstrating impaired ADL performance and functional mobility 2/2 generalized weakness and decreased activity tolerance (See OT problem list for additional functional deficits). Pt currently requires MIN A for bed mobility; CGA + RW to ambulate ~ 50 ft. Pt demonstrated occasional scissoring gate, reported fatigue, and experienced one minor LOB which pt self-corrected. Pt educated on proper IS use, demonstrated good carryover.  Pt would benefit from skilled OT services to address noted impairments and functional limitations (see below for any additional details) in order to maximize safety and independence while minimizing falls risk and caregiver burden. Anticipate the need for follow up OT services upon acute hospital DC.      If plan is discharge home, recommend the following:   A little help with walking and/or transfers;A little help with bathing/dressing/bathroom;Assist for transportation;Help with stairs or ramp for entrance     Functional Status Assessment   Patient has had a recent decline in their functional status and demonstrates the ability to make significant improvements in function in a reasonable and predictable amount of  time.     Equipment Recommendations   Other (comment) (defer to next venue of care)     Recommendations for Other Services         Precautions/Restrictions   Precautions Precautions: Fall Recall of Precautions/Restrictions: Intact Restrictions Weight Bearing Restrictions Per Provider Order: No     Mobility Bed Mobility Overal bed mobility: Needs Assistance Bed Mobility: Supine to Sit     Supine to sit: Min assist, HOB elevated (+HHA)          Transfers Overall transfer level: Needs assistance Equipment used: Rolling walker (2 wheels) Transfers: Sit to/from Stand Sit to Stand: Contact guard assist                  Balance Overall balance assessment: Needs assistance Sitting-balance support: Feet supported Sitting balance-Leahy Scale: Fair     Standing balance support: Bilateral upper extremity supported, Reliant on assistive device for balance Standing balance-Leahy Scale: Fair                             ADL either performed or assessed with clinical judgement   ADL Overall ADL's : Needs assistance/impaired                                       General ADL Comments: CGA for simulated toilet t/f     Vision         Perception         Praxis         Pertinent Vitals/Pain Pain Assessment Pain Assessment: No/denies pain     Extremity/Trunk Assessment Upper  Extremity Assessment Upper Extremity Assessment: Generalized weakness   Lower Extremity Assessment Lower Extremity Assessment: Generalized weakness       Communication Communication Communication: No apparent difficulties   Cognition Arousal: Alert Behavior During Therapy: WFL for tasks assessed/performed, Flat affect Cognition: No apparent impairments             OT - Cognition Comments: wife reports flat affect due to pt not feeling well                 Following commands: Intact       Cueing  General Comments   Cueing  Techniques: Verbal cues;Gestural cues      Exercises     Shoulder Instructions      Home Living Family/patient expects to be discharged to:: Private residence Living Arrangements: Spouse/significant other Available Help at Discharge: Family Type of Home: House Home Access: Stairs to enter Secretary/administrator of Steps: 2   Home Layout: One level     Bathroom Shower/Tub: Producer, television/film/video: Standard Bathroom Accessibility: No   Home Equipment: Agricultural consultant (2 wheels);Toilet riser   Additional Comments: lives with wife, daughter staying with them for a couple of days after d/c      Prior Functioning/Environment Prior Level of Function : Independent/Modified Independent             Mobility Comments: does not use RW but has it avaiable to him ADLs Comments: ind with ADLs    OT Problem List: Decreased strength;Decreased activity tolerance;Impaired balance (sitting and/or standing)   OT Treatment/Interventions: Self-care/ADL training;Patient/family education;Therapeutic exercise      OT Goals(Current goals can be found in the care plan section)   Acute Rehab OT Goals Patient Stated Goal: to go home OT Goal Formulation: With patient/family Time For Goal Achievement: 05/05/24 Potential to Achieve Goals: Good ADL Goals Pt Will Perform Lower Body Dressing: with supervision;sitting/lateral leans;sit to/from stand Pt Will Transfer to Toilet: with supervision;ambulating;regular height toilet Additional ADL Goal #1: Pt will demonstrate standing tolerance of > 10 min. for engagement in ADL/IADL task.   OT Frequency:  Min 2X/week    Co-evaluation PT/OT/SLP Co-Evaluation/Treatment: Yes Reason for Co-Treatment: For patient/therapist safety;To address functional/ADL transfers   OT goals addressed during session: ADL's and self-care      AM-PAC OT 6 Clicks Daily Activity     Outcome Measure Help from another person eating meals?: None Help from  another person taking care of personal grooming?: None Help from another person toileting, which includes using toliet, bedpan, or urinal?: A Little Help from another person bathing (including washing, rinsing, drying)?: A Lot Help from another person to put on and taking off regular upper body clothing?: A Little Help from another person to put on and taking off regular lower body clothing?: A Little 6 Click Score: 19   End of Session Equipment Utilized During Treatment: Gait belt;Rolling walker (2 wheels)  Activity Tolerance: Patient tolerated treatment well Patient left: in chair;with call bell/phone within reach;with family/visitor present  OT Visit Diagnosis: Unsteadiness on feet (R26.81);Other abnormalities of gait and mobility (R26.89);Muscle weakness (generalized) (M62.81)                Time: 8941-8885 OT Time Calculation (min): 16 min Charges:  OT General Charges $OT Visit: 1 Visit OT Evaluation $OT Eval Moderate Complexity: 1 [address] Student OT   [employer] 04/21/2024, 1:02 PM

## 2024-04-21 NOTE — Progress Notes (Signed)
 Date of Admission:  04/17/2024    ID: Casey Peters is a 78 y.o. male  Principal Problem:   HCAP (healthcare-associated pneumonia) Active Problems:   Biliary obstruction   Severe sepsis (HCC)   Acute respiratory failure with hypoxia (HCC)   AKI (acute kidney injury) (HCC)   Septic shock (HCC)   E coli bacteremia   Family at bed side Subjective: Doing better No fever Had breakfast Some pain in the epigastric behind the sternum  Medications:   Chlorhexidine  Gluconate Cloth  6 each Topical Daily   enoxaparin  (LOVENOX ) injection  40 mg Subcutaneous QHS   feeding supplement (GLUCERNA SHAKE)  237 mL Oral TID BM   guaiFENesin   600 mg Oral BID   insulin  aspart  0-9 Units Subcutaneous TID AC & HS   phosphorus  500 mg Oral TID   potassium chloride   40 mEq Oral Once   silodosin   8 mg Oral Daily   testosterone   5 g Transdermal Daily   Vitamin D  (Ergocalciferol )  50,000 Units Oral Q7 days    Objective: Vital signs in last 24 hours: Patient Vitals for the past 24 hrs:  BP Temp Temp src Pulse Resp SpO2  04/21/24 1000 126/79 -- -- 91 (!) 28 94 %  04/21/24 0900 -- -- -- 89 (!) 28 94 %  04/21/24 0800 128/77 99.5 F (37.5 C) Oral 81 (!) 24 96 %  04/21/24 0700 -- -- -- 85 (!) 23 93 %  04/21/24 0600 107/62 -- -- 90 20 94 %  04/21/24 0409 114/72 99.7 F (37.6 C) Oral 94 (!) 24 92 %  04/21/24 0200 121/73 -- -- 95 (!) 26 92 %  04/21/24 0100 -- -- -- 96 (!) 22 96 %  04/21/24 0000 (!) 143/70 100.3 F (37.9 C) Oral 92 (!) 29 97 %  04/20/24 2300 -- -- -- 96 19 98 %  04/20/24 2200 (!) 131/56 -- -- 79 (!) 23 98 %  04/20/24 2100 -- -- -- 98 (!) 25 100 %  04/20/24 2000 121/71 99.9 F (37.7 C) Oral 84 (!) 29 98 %  04/20/24 1930 -- -- -- 81 (!) 30 97 %  04/20/24 1915 -- -- -- 87 -- 97 %  04/20/24 1900 117/71 -- -- 77 -- 92 %  04/20/24 1845 -- -- -- 82 -- 98 %  04/20/24 1830 -- -- -- 82 -- 96 %  04/20/24 1815 -- -- -- 80 -- 95 %  04/20/24 1800 100/72 -- -- 78 -- 98 %  04/20/24 1745  -- -- -- 83 -- 96 %  04/20/24 1730 -- -- -- 86 -- 97 %  04/20/24 1715 -- -- -- (!) 25 -- 93 %  04/20/24 1700 123/73 99.4 F (37.4 C) Axillary 85 (!) 24 96 %  04/20/24 1645 -- -- -- 88 -- 96 %  04/20/24 1630 -- -- -- 91 -- 96 %  04/20/24 1615 -- -- -- 87 (!) 27 93 %  04/20/24 1600 119/66 -- -- 96 (!) 24 94 %  04/20/24 1545 -- -- -- 89 (!) 29 95 %  04/20/24 1530 -- -- -- 87 (!) 30 96 %  04/20/24 1515 -- -- -- 88 (!) 31 97 %  04/20/24 1509 -- (!) 102.3 F (39.1 C) Axillary -- -- --  04/20/24 1500 128/72 (!) 103.1 F (39.5 C) Axillary 88 (!) 28 96 %  04/20/24 1445 -- -- -- 86 (!) 25 94 %  04/20/24 1430 -- -- -- 87 ROLLEN)  24 94 %  04/20/24 1415 -- -- -- 91 (!) 30 96 %  04/20/24 1400 (!) 140/74 -- -- 91 (!) 30 98 %  04/20/24 1345 -- -- -- 92 (!) 30 98 %  04/20/24 1330 -- -- -- 89 (!) 27 96 %  04/20/24 1315 -- -- -- 86 (!) 25 96 %  04/20/24 1300 133/73 -- -- 89 (!) 31 96 %  04/20/24 1245 -- -- -- 88 (!) 30 95 %  04/20/24 1230 -- -- -- 88 (!) 31 97 %  04/20/24 1215 -- -- -- 87 (!) 28 98 %  04/20/24 1200 130/75 99 F (37.2 C) Oral 84 (!) 23 98 %      PHYSICAL EXAM:  General: alert, no distress Lungs:b/l air entry- crepts bases Heart: Regular rate and rhythm, no murmur, rub or gallop. Abdomen: Soft, rt perc biliary drain Extremities: atraumatic, no cyanosis. No edema. No clubbing Skin: No rashes or lesions. Or bruising Lymph: Cervical, supraclavicular normal. Neurologic: Grossly non-focal  Lab Results    Latest Ref Rng & Units 04/21/2024    4:54 AM 04/20/2024    4:23 AM 04/19/2024    5:00 AM  CBC  WBC 4.0 - 10.5 K/uL 6.4  9.3  17.2   Hemoglobin 13.0 - 17.0 g/dL 88.3  88.5  88.8   Hematocrit 39.0 - 52.0 % 33.8  34.0  32.8   Platelets 150 - 400 K/uL 142  139  153        Latest Ref Rng & Units 04/21/2024    4:54 AM 04/20/2024    4:23 AM 04/19/2024    5:00 AM  CMP  Glucose 70 - 99 mg/dL 857  878  898   BUN 8 - 23 mg/dL 17  21  24    Creatinine 0.61 - 1.24 mg/dL 8.89  8.71  8.79    Sodium 135 - 145 mmol/L 137  137  140   Potassium 3.5 - 5.1 mmol/L 3.4  3.2  3.9   Chloride 98 - 111 mmol/L 101  104  113   CO2 22 - 32 mmol/L 26  24  19    Calcium  8.9 - 10.3 mg/dL 7.8  7.9  8.0   Total Protein 6.5 - 8.1 g/dL 5.8  5.6  5.8   Total Bilirubin 0.0 - 1.2 mg/dL 1.2  1.5  1.7   Alkaline Phos 38 - 126 U/L 76  79  74   AST 15 - 41 U/L 25  21  23    ALT 0 - 44 U/L 35  40  57       Microbiology:  Studies/Results: No results found.    Assessment/Plan: 78 year old male with history of biliary obstruction E. coli bacteremia with sepsis following ERCP done on monday On  zosyn  which was started yesterday due to fever- previously was on ceftriaxone  Repeat blood culture NG so far Leucocytosis resolved ? Biliary obstruction.  Status post ERCP and stent replacement. Also has biliary drain Brushings have been sent andcytology shows reactive cells Increased CA 19-9    CAD status post CABG   Diabetes mellitus   Hypertension   AKI secondary to the infection   History of urethral strictures with frequent UTIs.  Was on suppressive therapy with nitrofurantoin    Discussed the management with the patient and his family at bedside and with hospitalist  ID will not see him this weekend But on call ID available by phone for urgent issues

## 2024-04-21 NOTE — Progress Notes (Signed)
 Casey Copping, MD [facility]   [address], [address] [place], [place] [address] Phone: [phone] Fax : [phone]   Subjective: The patient's white cell count has come down to normal and the liver enzymes are also improved and also back to normal.  The patient reports that he is feeling well today.  He told me he was feeling well yesterday but his wife reports that he did not feel well in the morning yesterday.  She also reports that his abdominal pain has gone away since he came to the emergency department.   Objective: Vital signs in last 24 hours: Vitals:   04/21/24 0100 04/21/24 0200 04/21/24 0409 04/21/24 0600  BP:  121/73 114/72 107/62  Pulse: 96 95 94 90  Resp: (!) 22 (!) 26 (!) 24 20  Temp:   99.7 F (37.6 C)   TempSrc:   Oral   SpO2: 96% 92% 92% 94%  Weight:      Height:       Weight change:   Intake/Output Summary (Last 24 hours) at 04/21/2024 0732 Last data filed at 04/21/2024 0700 Gross per 24 hour  Intake 612.36 ml  Output 1520 ml  Net -907.64 ml     Exam: General: Patient lying in bed in no apparent distress was sleeping when I came in but I woke him up and he is alert   Lab Results: @LABTEST2 @ Micro Results: Recent Results (from the past 240 hours)  Blood culture (routine x 2)     Status: Abnormal   Collection Time: 04/17/24 11:29 PM   Specimen: Right Antecubital; Blood  Result Value Ref Range Status   Specimen Description   Final    RIGHT ANTECUBITAL Performed at [facility], [address] [place], [place] [address]    Special Requests   Final    BOTTLES DRAWN AEROBIC AND ANAEROBIC Blood Culture adequate volume Performed at [facility], [address] [place], [place] [address]    Culture  Setup Time   Final    GRAM NEGATIVE RODS IN BOTH AEROBIC AND ANAEROBIC BOTTLES GRAM STAIN REVIEWED-AGREE WITH RESULT DRT CRITICAL RESULT CALLED TO, READ BACK BY AND VERIFIED WITH: TREY GREENWOOD AT 1020 04/18/24 RAM Performed at  [facility], [address] [place], [place] [address]    Culture ESCHERICHIA COLI (A)  Final   Report Status 04/20/2024 FINAL  Final   Organism ID, Bacteria ESCHERICHIA COLI  Final      Susceptibility   Escherichia coli - MIC*    AMPICILLIN >=32 RESISTANT Resistant     CEFEPIME  <=0.12 SENSITIVE Sensitive     CEFTAZIDIME 4 SENSITIVE Sensitive     CEFTRIAXONE  0.5 SENSITIVE Sensitive     CIPROFLOXACIN  >=4 RESISTANT Resistant     GENTAMICIN  <=1 SENSITIVE Sensitive     IMIPENEM 0.5 SENSITIVE Sensitive     TRIMETH /SULFA  <=20 SENSITIVE Sensitive     AMPICILLIN/SULBACTAM >=32 RESISTANT Resistant     PIP/TAZO <=4 SENSITIVE Sensitive ug/mL    CEFAZOLIN RESISTANT Resistant     * ESCHERICHIA COLI  Blood Culture ID Panel (Reflexed)     Status: Abnormal   Collection Time: 04/17/24 11:29 PM  Result Value Ref Range Status   Enterococcus faecalis NOT DETECTED NOT DETECTED Final   Enterococcus Faecium NOT DETECTED NOT DETECTED Final   Listeria monocytogenes NOT DETECTED NOT DETECTED Final   Staphylococcus species NOT DETECTED NOT DETECTED Final   Staphylococcus aureus (BCID) NOT DETECTED NOT DETECTED Final   Staphylococcus  epidermidis NOT DETECTED NOT DETECTED Final   Staphylococcus lugdunensis NOT DETECTED NOT DETECTED Final   Streptococcus species NOT DETECTED NOT DETECTED Final   Streptococcus agalactiae NOT DETECTED NOT DETECTED Final   Streptococcus pneumoniae NOT DETECTED NOT DETECTED Final   Streptococcus pyogenes NOT DETECTED NOT DETECTED Final   A.calcoaceticus-baumannii NOT DETECTED NOT DETECTED Final   Bacteroides fragilis NOT DETECTED NOT DETECTED Final   Enterobacterales DETECTED (A) NOT DETECTED Final    Comment: Enterobacterales represent a large order of gram negative bacteria, not a single organism. CRITICAL RESULT CALLED TO, READ BACK BY AND VERIFIED WITH: TREY GREENWOOD AT 1020 04/18/24 RAM    Enterobacter cloacae complex NOT DETECTED NOT DETECTED Final   Escherichia  coli DETECTED (A) NOT DETECTED Final    Comment: RESULT CALLED TO, READ BACK BY AND VERIFIED WITH: TREY GREENWOOD AT 1020 04/18/24 RAM CRITICAL RESULT CALLED TO, READ BACK BY AND VERIFIED WITH:    Klebsiella aerogenes NOT DETECTED NOT DETECTED Final   Klebsiella oxytoca NOT DETECTED NOT DETECTED Final   Klebsiella pneumoniae NOT DETECTED NOT DETECTED Final   Proteus species NOT DETECTED NOT DETECTED Final   Salmonella species NOT DETECTED NOT DETECTED Final   Serratia marcescens NOT DETECTED NOT DETECTED Final   Haemophilus influenzae NOT DETECTED NOT DETECTED Final   Neisseria meningitidis NOT DETECTED NOT DETECTED Final   Pseudomonas aeruginosa NOT DETECTED NOT DETECTED Final   Stenotrophomonas maltophilia NOT DETECTED NOT DETECTED Final   Candida albicans NOT DETECTED NOT DETECTED Final   Candida auris NOT DETECTED NOT DETECTED Final   Candida glabrata NOT DETECTED NOT DETECTED Final   Candida krusei NOT DETECTED NOT DETECTED Final   Candida parapsilosis NOT DETECTED NOT DETECTED Final   Candida tropicalis NOT DETECTED NOT DETECTED Final   Cryptococcus neoformans/gattii NOT DETECTED NOT DETECTED Final   CTX-M ESBL NOT DETECTED NOT DETECTED Final   Carbapenem resistance IMP NOT DETECTED NOT DETECTED Final   Carbapenem resistance KPC NOT DETECTED NOT DETECTED Final   Carbapenem resistance NDM NOT DETECTED NOT DETECTED Final   Carbapenem resist OXA 48 LIKE NOT DETECTED NOT DETECTED Final   Carbapenem resistance VIM NOT DETECTED NOT DETECTED Final    Comment: Performed at [facility], [address] [place], [place] [address]  Blood culture (routine x 2)     Status: Abnormal   Collection Time: 04/18/24  2:46 AM   Specimen: BLOOD  Result Value Ref Range Status   Specimen Description   Final    BLOOD RIGHT ARM Performed at [facility], [address] [place], [place] [address]    Special Requests   Final    BOTTLES DRAWN AEROBIC AND ANAEROBIC Blood Culture  results may not be optimal due to an inadequate volume of blood received in culture bottles Performed at [facility], [address] [place], [place] [address]    Culture  Setup Time   Final    GRAM NEGATIVE RODS ANAEROBIC BOTTLE ONLY RESULT CALLED TO, READ BACK BY AND VERIFIED WITH: TREY GREENWOOD AT 1420 04/18/24 RAM Performed at [facility], [address] [place], [place] [address]    Culture (A)  Final    ESCHERICHIA COLI SUSCEPTIBILITIES PERFORMED ON PREVIOUS CULTURE WITHIN THE LAST 5 DAYS. Performed at [facility], [address] [place], [place] [address]    Report Status 04/20/2024 FINAL  Final  MRSA Next Gen by PCR, Nasal     Status: None   Collection Time: 04/18/24  8:09 PM   Specimen: Nasal Mucosa; Nasal Swab  Result Value Ref Range Status   MRSA by PCR Next Gen NOT DETECTED NOT DETECTED Final    Comment: (NOTE) The GeneXpert MRSA Assay (FDA approved for NASAL specimens only), is one component of a comprehensive MRSA colonization surveillance program. It is not intended to diagnose MRSA infection nor to guide or monitor treatment for MRSA infections. Test performance is not FDA approved in patients less than 61 years old. Performed at [facility], [address] [place], [place] [address]   Culture, blood (Routine X 2) w Reflex to ID Panel     Status: None (Preliminary result)   Collection Time: 04/20/24  4:23 AM   Specimen: BLOOD  Result Value Ref Range Status   Specimen Description BLOOD BLOOD RIGHT ARM  Final   Special Requests   Final    BOTTLES DRAWN AEROBIC AND ANAEROBIC Blood Culture adequate volume   Culture   Final    NO GROWTH 1 DAY Performed at [facility], [address], [place], [place] [address]    Report Status PENDING  Incomplete  Culture, blood (Routine X 2) w Reflex to ID Panel     Status: None (Preliminary result)   Collection Time: 04/20/24  4:23 AM   Specimen: BLOOD  Result Value Ref  Range Status   Specimen Description BLOOD BLOOD RIGHT HAND  Final   Special Requests IN PEDIATRIC BOTTLE Blood Culture adequate volume  Final   Culture   Final    NO GROWTH 1 DAY Performed at [facility], [address] [place], [place] [address]    Report Status PENDING  Incomplete   Studies/Results: No results found. Medications: I have reviewed the patient's current medications. Scheduled Meds:  Chlorhexidine  Gluconate Cloth  6 each Topical Daily   feeding supplement (GLUCERNA SHAKE)  237 mL Oral TID BM   guaiFENesin   600 mg Oral BID   heparin  injection (subcutaneous)  5,000 Units Subcutaneous Q8H   insulin  aspart  0-9 Units Subcutaneous TID AC & HS   silodosin   8 mg Oral Daily   testosterone   5 g Transdermal Daily   Vitamin D  (Ergocalciferol )  50,000 Units Oral Q7 days   Continuous Infusions:  piperacillin -tazobactam (ZOSYN )  IV 12.5 mL/hr at 04/21/24 0700   PRN Meds:.acetaminophen  **OR** acetaminophen , albuterol , guaiFENesin -dextromethorphan, ondansetron  **OR** ondansetron  (ZOFRAN ) IV   Assessment: Principal Problem:   HCAP (healthcare-associated pneumonia) Active Problems:   Biliary obstruction   Severe sepsis (HCC)   Acute respiratory failure with hypoxia (HCC)   AKI (acute kidney injury) (HCC)   Septic shock (HCC)   E coli bacteremia    Plan: The patient has been doing better every day since presents to the emergency room with sepsis.  His liver enzymes and white cell count are now normal.  The patient denies any abdominal pain.  I have requested that interventional radiology brush the bile duct for cytology since the multiple brushings done by me at the previous 2 ERCPs did not show any definitive diagnosis with one showing atypical cells and the other one showing inflammation. The patient is being followed by infectious disease at this time.  Dr. Aundria will be available this weekend for any questions and will be available with any questions.   LOS:  3 days   Casey Copping, MD.FACG 04/21/2024, 7:32 AM Pager [phone] 7am-5pm  Check AMION for 5pm -7am coverage and on weekends

## 2024-04-21 NOTE — TOC Initial Note (Signed)
 Transition of Care [facility]) - Initial/Assessment Note    Patient Details  Name: Casey Peters MRN: [id number] Date of Birth: [birth year]  Transition of Care [facility]) CM/SW Contact:    Casey JINNY Ruts, LCSW Phone Number: 04/21/2024, 3:08 PM  Clinical Narrative:                 Chart reviewed. Patient was admitted for HCAP. Patient has a supportive wife and daughter. Patient was able to daily living ask independently. Patient reports that wife takes him to medical appointments and will assist with discharge.   Patient reports using [facility] as his pharmacy and copays are affordable. Patient has DME in the home (Rolling walker, an toilet riser).   Patient refused PT recommendation of SNF but possible interest in [facility].   CSW will follow up with patient to provide listing of HH in the area.     Expected Discharge Plan: Home w Home Health Services Barriers to Discharge: Continued Medical Work up   Patient Goals and CMS Choice     Choice offered to / list presented to :  (Patient refused SNF. Patient reports PT provider recommended home discharge. CSW will follow up with PT about [facility].)      Expected Discharge Plan and Services   Discharge Planning Services: CM Consult Post Acute Care Choice: Home Health Living arrangements for the past 2 months: Single Family Home                                      Prior Living Arrangements/Services Living arrangements for the past 2 months: Single Family Home Lives with:: Spouse (Patient daughter will live with patient after discharge to assist with patient care.) Patient language and need for interpreter reviewed:: Yes            Current home services: DME (Patient reports having a rolling walker and toilet riser.)    Activities of Daily Living   ADL Screening (condition at time of admission) Independently performs ADLs?: Yes (appropriate for developmental age) Is the patient deaf or have difficulty hearing?: No Does the  patient have difficulty seeing, even when wearing glasses/contacts?: No Does the patient have difficulty concentrating, remembering, or making decisions?: No  Permission Sought/Granted                  Emotional Assessment Appearance:: Appears stated age Attitude/Demeanor/Rapport: Engaged Affect (typically observed): Appropriate Orientation: : Oriented to Self, Oriented to Place, Oriented to  Time, Oriented to Situation      Admission diagnosis:  Septic shock (HCC) [A41.9, R65.21] Sepsis (HCC) [A41.9] Fever, unspecified fever cause [R50.9] Acute respiratory failure with hypoxemia (HCC) [J96.01] Multifocal pneumonia [J18.9] Sepsis, due to unspecified organism, unspecified whether acute organ dysfunction present 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   S/P cardiac catheterization 12/27/2021   Dilated idiopathic cardiomyopathy (HCC)    Hypertriglyceridemia  HFrEF (heart failure with reduced ejection fraction) (HCC)    Palpitations 02/08/2020   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 [place] [address] Phone: [phone] Fax: [phone]     Social Drivers of Health (SDOH) Social History: SDOH Screenings   Food Insecurity: No Food Insecurity (04/19/2024)  Housing: Low Risk  (04/19/2024)  Transportation Needs: No Transportation Needs (04/19/2024)  Utilities: Not At Risk (04/19/2024)  Depression (PHQ2-9): Low Risk  (03/28/2024)  Social Connections: Socially Integrated (04/19/2024)  Tobacco Use: High Risk (04/17/2024)   SDOH Interventions:     Readmission Risk Interventions     No data to display

## 2024-04-21 NOTE — Plan of Care (Signed)
  Problem: Coping: Goal: Ability to adjust to condition or change in health will improve Outcome: Progressing   Problem: Fluid Volume: Goal: Ability to maintain a balanced intake and output will improve Outcome: Progressing   Problem: Health Behavior/Discharge Planning: Goal: Ability to manage health-related needs will improve Outcome: Progressing   Problem: Nutritional: Goal: Maintenance of adequate nutrition will improve Outcome: Progressing   Problem: Skin Integrity: Goal: Risk for impaired skin integrity will decrease Outcome: Progressing   Problem: Clinical Measurements: Goal: Diagnostic test results will improve Outcome: Progressing Goal: Signs and symptoms of infection will decrease Outcome: Progressing   Problem: Respiratory: Goal: Ability to maintain adequate ventilation will improve Outcome: Progressing

## 2024-04-22 DIAGNOSIS — J9601 Acute respiratory failure with hypoxia: Secondary | ICD-10-CM

## 2024-04-22 DIAGNOSIS — R42 Dizziness and giddiness: Secondary | ICD-10-CM

## 2024-04-22 DIAGNOSIS — R55 Syncope and collapse: Secondary | ICD-10-CM | POA: Diagnosis not present

## 2024-04-22 DIAGNOSIS — J189 Pneumonia, unspecified organism: Secondary | ICD-10-CM | POA: Diagnosis not present

## 2024-04-22 DIAGNOSIS — A419 Sepsis, unspecified organism: Secondary | ICD-10-CM | POA: Diagnosis not present

## 2024-04-22 LAB — HEPATIC FUNCTION PANEL
ALT: 44 U/L (ref 0–44)
AST: 43 U/L — ABNORMAL HIGH (ref 15–41)
Albumin: 2.3 g/dL — ABNORMAL LOW (ref 3.5–5.0)
Alkaline Phosphatase: 94 U/L (ref 38–126)
Bilirubin, Direct: 0.6 mg/dL — ABNORMAL HIGH (ref 0.0–0.2)
Indirect Bilirubin: 1 mg/dL — ABNORMAL HIGH (ref 0.3–0.9)
Total Bilirubin: 1.6 mg/dL — ABNORMAL HIGH (ref 0.0–1.2)
Total Protein: 5.7 g/dL — ABNORMAL LOW (ref 6.5–8.1)

## 2024-04-22 LAB — BASIC METABOLIC PANEL WITH GFR
Anion gap: 10 (ref 5–15)
BUN: 20 mg/dL (ref 8–23)
CO2: 28 mmol/L (ref 22–32)
Calcium: 8.2 mg/dL — ABNORMAL LOW (ref 8.9–10.3)
Chloride: 101 mmol/L (ref 98–111)
Creatinine, Ser: 1.18 mg/dL (ref 0.61–1.24)
GFR, Estimated: >60 mL/min (ref >60)
Glucose, Bld: 157 mg/dL — ABNORMAL HIGH (ref 70–99)
Potassium: 4 mmol/L (ref 3.5–5.1)
Sodium: 139 mmol/L (ref 135–145)

## 2024-04-22 LAB — GLUCOSE, CAPILLARY
Glucose-Capillary: 133 mg/dL — ABNORMAL HIGH (ref 70–99)
Glucose-Capillary: 163 mg/dL — ABNORMAL HIGH (ref 70–99)
Glucose-Capillary: 136 mg/dL — ABNORMAL HIGH (ref 70–99)
Glucose-Capillary: 160 mg/dL — ABNORMAL HIGH (ref 70–99)
Glucose-Capillary: 137 mg/dL — ABNORMAL HIGH (ref 70–99)

## 2024-04-22 LAB — CBC
HCT: 36.3 % — ABNORMAL LOW (ref 39.0–52.0)
Hemoglobin: 12.5 g/dL — ABNORMAL LOW (ref 13.0–17.0)
MCH: 32 pg (ref 26.0–34.0)
MCHC: 34.4 g/dL (ref 30.0–36.0)
MCV: 92.8 fL (ref 80.0–100.0)
Platelets: 161 K/uL (ref 150–400)
RBC: 3.91 MIL/uL — ABNORMAL LOW (ref 4.22–5.81)
RDW: 15.6 % — ABNORMAL HIGH (ref 11.5–15.5)
WBC: 6.7 K/uL (ref 4.0–10.5)
nRBC: 0 % (ref 0.0–0.2)

## 2024-04-22 LAB — PHOSPHORUS: Phosphorus: 3 mg/dL (ref 2.5–4.6)

## 2024-04-22 LAB — TROPONIN I (HIGH SENSITIVITY)
Troponin I (High Sensitivity): 16 ng/L (ref <18)
Troponin I (High Sensitivity): 15 ng/L (ref <18)

## 2024-04-22 LAB — MAGNESIUM: Magnesium: 1.9 mg/dL (ref 1.7–2.4)

## 2024-04-22 MED ORDER — NITROGLYCERIN 0.4 MG SL SUBL
0.4000 mg | SUBLINGUAL_TABLET | SUBLINGUAL | Status: DC | PRN
  Administered 2024-04-22: 0.4 mg via SUBLINGUAL
  Filled 2024-04-22: qty 1

## 2024-04-22 MED ORDER — PANTOPRAZOLE SODIUM 40 MG PO TBEC
40.0000 mg | DELAYED_RELEASE_TABLET | Freq: Two times a day (BID) | ORAL | Status: DC
  Administered 2024-04-22 – 2024-04-24 (×3): 40 mg via ORAL
  Filled 2024-04-22 (×4): qty 1

## 2024-04-22 MED ORDER — SODIUM CHLORIDE 0.9 % IV SOLN: INTRAVENOUS | Status: AC

## 2024-04-22 MED ORDER — ALUM & MAG HYDROXIDE-SIMETH 200-200-20 MG/5ML PO SUSP: 30.0000 mL | Freq: Once | ORAL | Status: DC

## 2024-04-22 MED ORDER — ALUM & MAG HYDROXIDE-SIMETH 200-200-20 MG/5ML PO SUSP: 15.0000 mL | Freq: Four times a day (QID) | ORAL | Status: DC | PRN

## 2024-04-22 MED ORDER — PANTOPRAZOLE SODIUM 40 MG IV SOLR
40.0000 mg | Freq: Once | INTRAVENOUS | Status: AC
  Administered 2024-04-22: 40 mg via INTRAVENOUS
  Filled 2024-04-22: qty 10

## 2024-04-22 MED ORDER — MORPHINE SULFATE (PF) 2 MG/ML IV SOLN: 2.0000 mg | INTRAVENOUS | Status: DC | PRN

## 2024-04-22 MED ORDER — PANTOPRAZOLE SODIUM 40 MG PO TBEC
40.0000 mg | DELAYED_RELEASE_TABLET | Freq: Every day | ORAL | Status: DC
  Administered 2024-04-23: 40 mg via ORAL

## 2024-04-22 MED ORDER — MENTHOL 3 MG MT LOZG
1.0000 | LOZENGE | OROMUCOSAL | Status: DC | PRN
  Administered 2024-04-22: 3 mg via ORAL
  Filled 2024-04-22: qty 9

## 2024-04-22 MED ORDER — ALUM & MAG HYDROXIDE-SIMETH 200-200-20 MG/5ML PO SUSP
15.0000 mL | Freq: Once | ORAL | Status: AC
  Administered 2024-04-22: 15 mL via ORAL
  Filled 2024-04-22: qty 30

## 2024-04-22 NOTE — Plan of Care (Signed)

## 2024-04-22 NOTE — Progress Notes (Signed)
   04/22/24 0300  Spiritual Encounters  Type of Visit Initial  Conversation partners present during encounter Nurse  Referral source Code page  Reason for visit Code  OnCall Visit Yes  Spiritual Framework  Family Stress Factors Exhausted;Health changes  Interventions  Spiritual Care Interventions Made Established relationship of care and support;Compassionate presence   Chaplain provided compassionate presence.  Chaplain waited for nurse to triage patient and give care to patient. Care was provided to patient and Chaplain encouraged the nurse.

## 2024-04-22 NOTE — TOC Progression Note (Addendum)
 Transition of Care [facility]) - Progression Note    Patient Details  Name: Casey Peters MRN: [id number] Date of Birth: 05-12-1946  [facility]) CM/SW Contact  [place] Be[place], [place] Phone Number: 04/22/2024, 2:24 PM  Clinical Narrative:     CSW met with patient. Spouse was at bedside. CSW spoke to family about home health needs. Family continued to decline SNF placement. Mrs. Rudnick advised that she has no preference for Home Health. She was agreeable to allowing to make a choice based on the insurance provider.   Mrs. Budnick advised that when her husband is able she would like to consider outpatient PT. She stated that for right now home health is the appropriate option. CSW will set up services once patient is ready for discharge.   Expected Discharge Plan: Home w Home Health Services Barriers to Discharge: Continued Medical Work up  Expected Discharge Plan and Services   Discharge Planning Services: CM Consult Post Acute Care Choice: Home Health Living arrangements for the past 2 months: Single Family Home                                       Social Determinants of Health (SDOH) Interventions SDOH Screenings   Food Insecurity: No Food Insecurity (04/19/2024)  Housing: Low Risk  (04/19/2024)  Transportation Needs: No Transportation Needs (04/19/2024)  Utilities: Not At Risk (04/19/2024)  Depression (PHQ2-9): Low Risk  (03/28/2024)  Social Connections: Socially Integrated (04/19/2024)  Tobacco Use: High Risk (04/17/2024)    Readmission Risk Interventions     No data to display

## 2024-04-22 NOTE — Progress Notes (Addendum)
 Critical care note:  Date of note: 04/22/2024.  Subjective: The patient was having epigastric and substernal chest pain earlier earlier this morning and BP earlier was 110/72 with heart rate of 81, temperature 97.7 respiratory rate 18 with pulse continue 97% on room air.  He was given 1 sublingual nitroglycerin  and had a bowel movement and his systolic BP dropped to the 40s.  Later on went up to 100/63 with a MAP of 66.  The patient denies any nausea or vomiting or abdominal pain.  No fever or chills.  No cough or wheezing hemoptysis.  He admitted to epigastric abdominal discomfort.  Objective: Physical examination: Generally: Acutely ill elderly Caucasian male in no significant respiratory distress. Vital signs: As above. Head - atraumatic, normocephalic.  Pupils - equal, round and reactive to light and accommodation. Extraocular movements are intact. No scleral icterus.  Oropharynx - moist mucous membranes and tongue. No pharyngeal erythema or exudate.  Neck - supple. No JVD. Carotid pulses 2+ bilaterally. No carotid bruits. No palpable thyromegaly or lymphadenopathy. Cardiovascular - regular rate and rhythm. Normal S1 and S2. No murmurs, gallops or rubs.  Lungs - clear to auscultation bilaterally.  Abdomen - soft and nontender. Positive bowel sounds. No palpable organomegaly or masses.  Extremities - no pitting edema, clubbing or cyanosis.  Neuro - grossly non-focal. Skin - no rashes. GU and rectal exam - deferred.  Labs and notes were reviewed.  Assessment/plan 1.  Neurally mediated presyncope.  The patient has acute respiratory failure with hypoxia with HCAP. - This could be contributed to by hypotension in addition to having a bowel movement. - The patient will be placed on hydration with IV normal saline.  He will be monitored for arrhythmias. - Will avoid nitroglycerin  currently. - Will monitor his BP.  2.  Chest pain/substernal and epigastric pain. - Will.  Troponins and  EKGs. - The patient will be placed on as needed IV morphine  sulfate with improvement of his blood pressure.  Authorized and performed by: Madison Peaches, MD Total critical care time:  35      minutes. Due to a high probability of clinically significant, life-threatening deterioration, the patient required my highest level of preparedness to intervene emergently and I personally spent this critical care time directly and personally managing the patient.  This critical care time included obtaining a history, examining the patient, pulse oximetry, ordering and review of studies, arranging urgent treatment with development of management plan, evaluation of patient's response to treatment, frequent reassessment, and discussions with other providers. This critical care time was performed to assess and manage the high probability of imminent, life-threatening deterioration that could result in multiorgan failure.  It was exclusive of separately billable procedures and treating other patients and teaching time.

## 2024-04-22 NOTE — Progress Notes (Signed)
 Progress Note   Patient: Casey Peters FMW:[id number] DOB: 06/06/1946 DOA: 04/17/2024     4 DOS: the patient was seen and examined on 04/22/2024   Brief hospital course: Casey Peters is a 78 y.o. male with medical history significant for dilated cardiomyopathy, CAD RCA stent in 2023, HTN, HLD, IIDM, BPH with history of recurrent UTIs on suppressive antibiotics, recently hospitalized with painless jaundice, s/p ERCP  with biliary stent placement in 03/15/24, exchanged yesterday 04/17/24, with brushings sent to cytology who is being admitted for sepsis secondary to multifocal pneumonia.  History is given by wife at the bedside who states that he has been weak and staying in bed since his hospitalization a month ago when he had the first stent placed, however yesterday several hours after his procedure he developed a cough and a fever which prompted the visit to the ED.  She states he has had nausea but without vomiting and has ongoing abdominal pain for over a month which has not changed.  Patient appears very frail, lethargic and unable to contribute to history. In the ED tachycardic to the 140s to 150s and tachypneic to the 30s.  BP initially 107/63 but improving to the 160s following hydration.  O2 sat initially in the 90s on room air but desatted down to as low as 62 requiring 5 L O2 via nasal cannula to maintain sats in the high 90s. Labs notable for WBC 18,000 with lactic acid 1.3-->2.3.  Creatinine 1.45 up from baseline of 0.76 a month ago.  LFTs reassuring compared to prior.  BNP normal.  EKG with sinus tachycardia at 147 CT abdomen and pelvis showing postsurgical changes with drains in place and no acute findings.  And CTA PE protocol negative for PE but showing patchy peripheral infiltrates possibly representing multifocal pneumonia. Patient given sepsis fluid bolus and started on Zosyn  and vancomycin  for possible intra-abdominal source. Admission requested   Assessment and Plan:  #  Septic shock secondary to E. coli bacteremia and HCAP S/p 3 L fluid bolus given 7/8 S/p Levophed  given for 6 hours then patient was transferred to ICU, downgraded in the evening to [facility] service on 7/8 Continue maintenance IV fluid Monitor for fluid overload in view of history of HFrEF S/p cefepime , Flagyl  and vancomycin . ID consulted, s/p Flagyl  and ceftriaxone  due to E. 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 S/p ERCP 03/15/2024 and 04/17/2024, brush biopsy sent for cytology.  No acute procedural complications suspected. Cytology : Few atypical cells, favor reactive CT abdomen and pelvis nonacute GI consulted to follow 7/9 consulted IR for biliary drain exchange and repeat brush biopsy for adequate sampling to rule out malignancy 7/10 patient was feeling weak so he refused IR for the procedure today, patient was consulted and we will get it done tomorrow a.m. if he feels stable 7/11 Patient continues to decline procedure for now     # Acute respiratory failure with hypoxia # HCAP (healthcare-associated pneumonia) Sepsis criteria include fever, tachycardia and tachypnea with leukocytosis and lactic acidosis with multifocal pneumonia Possible aspiration during sedation for procedure given timing of onset of symptoms Patient was hypoxic to 62% and tachypneic to the 30s requiring 5 L  to maintain sats in the high 90s S/p cefepime , vancomycin  and Flagyl , Sepsis fluids SLP consult Patient is at high risk of clinical deterioration Continue IV Zosyn  per ID recommendation    # AKI (acute kidney injury) due to septic shock. Resolved  Creatinine improved with IV fluid Monitor renal function and avoid  nephrotoxins   # Hypokalemia, potassium repleted. # Hypomagnesemia, mag repleted. # Hypophosphatemia, Phos repleted. Monitor electrolytes and replete as needed.   # Metabolic acidosis Bicarb 100 mEq IV one-time dose given Monitor BMP daily     # h/o STEMI S/p cath with stent to RCA.  Held home meds Resume aspirin  when stable after the procedure Troponin 89 possible demand ischemia Repeat TTE LVEF 50 to 55%, grade 1 diastolic dysfunction.  No WMA, no any other significant findings     # HFrEF (heart failure with reduced ejection fraction) (HCC) Dilated idiopathic cardiomyopathy (HCC) Hold carvedilol , spironolactone  in the setting of sepsis Daily weights   # Controlled type 2 diabetes mellitus without complication  Sliding scale insulin  coverage   # Pulmonary nodule, incidental finding. CT chest: Scattered nodules in the lungs, largest in the right middle lung measuring 7 mm diameter. Non-contrast chest CT at 3-6 months is recommended. If the nodules are stable at time of repeat CT, then future CT at 18-24 months (from today's scan) is considered optional for low-risk patients, but is recommended for high-risk patients. Recommended to follow with PCP and pulmonary as an outpatient. Patient is already following oncologist   # Vitamin D  deficiency: started vitamin D  50,000 units p.o. weekly, follow with PCP to repeat vitamin D  level after 3 to 6 months.   # GERD, started PPI twice daily and Maalox as needed    Body mass index is 27.92 kg/m.  Interventions:     Subjective: Overnight patient had an episode of midsternal chest pain, patient was given nitroglycerin  and blood pressure was dropped to he was not feeling good.  BP improved, pain is improving gradually, pointing to midsternal chest pain it seems likely GERD. Troponin x 2 negative   Physical Exam: Vitals:   04/22/24 0313 04/22/24 0329 04/22/24 0754 04/22/24 1120  BP: 107/63 100/64 131/72 134/71  Pulse: 76 72 73 73   Resp: 16 20  20   Temp: 98 F (36.7 C) 98 F (36.7 C) 98.1 F (36.7 C) 97.9 F (36.6 C)  TempSrc: Oral Oral Oral Oral  SpO2: 95% 95% 98% 99%  Weight:      Height:       General: Sleeping but arouses easily Lungs: Clear to auscultation bilaterally.  Heart: Regular rate and rhythm, no murmur, rub or gallop. Abdomen: Soft, rt perc biliary drain Extremities: atraumatic, no cyanosis. No edema. No clubbing Skin: No rashes or lesions. Or bruising Lymph: Cervical, supraclavicular normal. Neurologic: Grossly non-focal, weak   Data Reviewed: Lipase 60, potassium 4.0, phosphorus 3.0 Labs reviewed  Family Communication: Plan of care discussed with patient at the bedside.  He verbalizes understanding and agrees with the plan  Disposition: Status is: Inpatient Remains inpatient appropriate because: On IV antibiotics  Planned Discharge Destination: TBD    Time spent:  45  minutes  Author: Elvan Sor, MD 04/22/2024 3:04 PM  For on call review [email or web address].

## 2024-04-22 NOTE — Progress Notes (Signed)
 07/12 0225 Messaged Dr. Lawence informing him of pt having 6/10 burning mid sternum chest pain. 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 MD ordered Nitroglycerin , EKG, troponins, Morphine . See EMR 0257 Pt complained of feeling sick, needing to have a bowel movement. BP rechecked and was 61/41 (48). Pt had incontinent episode of stool in chair at this time with near syncopal episode. Rapid response was called. Pt assisted back to bed. Cardiac work-up completed. See EMR. MD came to bedside and assessed pt.  0313 VS returned to normal. Pt still complaining of burning mid sternum pain. Fluids ordered to supplement BP, GI coctail ordered (pt refused stating it does not work). Additional medication ordered. See EMR 0350 Pt resting in bed at this time with no additional complaints. Daughter at bedside.

## 2024-04-23 DIAGNOSIS — J189 Pneumonia, unspecified organism: Secondary | ICD-10-CM | POA: Diagnosis not present

## 2024-04-23 LAB — MAGNESIUM: Magnesium: 1.9 mg/dL (ref 1.7–2.4)

## 2024-04-23 LAB — BASIC METABOLIC PANEL WITH GFR
Anion gap: 5 (ref 5–15)
BUN: 17 mg/dL (ref 8–23)
CO2: 27 mmol/L (ref 22–32)
Calcium: 7.6 mg/dL — ABNORMAL LOW (ref 8.9–10.3)
Chloride: 105 mmol/L (ref 98–111)
Creatinine, Ser: 1.11 mg/dL (ref 0.61–1.24)
GFR, Estimated: >60 mL/min (ref >60)
Glucose, Bld: 117 mg/dL — ABNORMAL HIGH (ref 70–99)
Potassium: 3.6 mmol/L (ref 3.5–5.1)
Sodium: 137 mmol/L (ref 135–145)

## 2024-04-23 LAB — HEPATIC FUNCTION PANEL
ALT: 33 U/L (ref 0–44)
AST: 25 U/L (ref 15–41)
Albumin: 2.2 g/dL — ABNORMAL LOW (ref 3.5–5.0)
Alkaline Phosphatase: 81 U/L (ref 38–126)
Bilirubin, Direct: 0.5 mg/dL — ABNORMAL HIGH (ref 0.0–0.2)
Indirect Bilirubin: 0.6 mg/dL (ref 0.3–0.9)
Total Bilirubin: 1.1 mg/dL (ref 0.0–1.2)
Total Protein: 5.7 g/dL — ABNORMAL LOW (ref 6.5–8.1)

## 2024-04-23 LAB — CBC
HCT: 33.6 % — ABNORMAL LOW (ref 39.0–52.0)
Hemoglobin: 11.3 g/dL — ABNORMAL LOW (ref 13.0–17.0)
MCH: 31.4 pg (ref 26.0–34.0)
MCHC: 33.6 g/dL (ref 30.0–36.0)
MCV: 93.3 fL (ref 80.0–100.0)
Platelets: 168 K/uL (ref 150–400)
RBC: 3.6 MIL/uL — ABNORMAL LOW (ref 4.22–5.81)
RDW: 15.5 % (ref 11.5–15.5)
WBC: 7.1 K/uL (ref 4.0–10.5)
nRBC: 0 % (ref 0.0–0.2)

## 2024-04-23 LAB — PHOSPHORUS: Phosphorus: 2.1 mg/dL — ABNORMAL LOW (ref 2.5–4.6)

## 2024-04-23 LAB — LIPASE, BLOOD: Lipase: 63 U/L — ABNORMAL HIGH (ref 11–51)

## 2024-04-23 LAB — GLUCOSE, CAPILLARY
Glucose-Capillary: 130 mg/dL — ABNORMAL HIGH (ref 70–99)
Glucose-Capillary: 164 mg/dL — ABNORMAL HIGH (ref 70–99)
Glucose-Capillary: 133 mg/dL — ABNORMAL HIGH (ref 70–99)
Glucose-Capillary: 137 mg/dL — ABNORMAL HIGH (ref 70–99)

## 2024-04-23 MED ORDER — POTASSIUM PHOSPHATES 15 MMOLE/5ML IV SOLN
30.0000 mmol | Freq: Once | INTRAVENOUS | Status: AC
  Administered 2024-04-23: 30 mmol via INTRAVENOUS
  Filled 2024-04-23: qty 10

## 2024-04-23 NOTE — Progress Notes (Signed)
 Progress Note   Patient: Casey Peters FMW:[id number] DOB: 1946-08-12 DOA: 04/17/2024     5 DOS: the patient was seen and examined on 04/23/2024   Brief hospital course: Casey Peters is a 78 y.o. male with medical history significant for dilated cardiomyopathy, CAD RCA stent in 2023, HTN, HLD, IIDM, BPH with history of recurrent UTIs on suppressive antibiotics, recently hospitalized with painless jaundice, s/p ERCP  with biliary stent placement in 03/15/24, exchanged yesterday 04/17/24, with brushings sent to cytology who is being admitted for sepsis secondary to multifocal pneumonia.  History is given by wife at the bedside who states that he has been weak and staying in bed since his hospitalization a month ago when he had the first stent placed, however yesterday several hours after his procedure he developed a cough and a fever which prompted the visit to the ED.  She states he has had nausea but without vomiting and has ongoing abdominal pain for over a month which has not changed.  Patient appears very frail, lethargic and unable to contribute to history. In the ED tachycardic to the 140s to 150s and tachypneic to the 30s.  BP initially 107/63 but improving to the 160s following hydration.  O2 sat initially in the 90s on room air but desatted down to as low as 62 requiring 5 L O2 via nasal cannula to maintain sats in the high 90s. Labs notable for WBC 18,000 with lactic acid 1.3-->2.3.  Creatinine 1.45 up from baseline of 0.76 a month ago.  LFTs reassuring compared to prior.  BNP normal.  EKG with sinus tachycardia at 147 CT abdomen and pelvis showing postsurgical changes with drains in place and no acute findings.  And CTA PE protocol negative for PE but showing patchy peripheral infiltrates possibly representing multifocal pneumonia. Patient given sepsis fluid bolus and started on Zosyn  and vancomycin  for possible intra-abdominal source. Admission requested   Assessment and Plan:  #  Septic shock secondary to E. coli bacteremia and HCAP S/p 3 L fluid bolus given 7/8 S/p Levophed  given for 6 hours then patient was transferred to ICU, downgraded in the evening to [facility] service on 7/8 Continue maintenance IV fluid Monitor for fluid overload in view of history of HFrEF S/p cefepime , Flagyl  and vancomycin . ID consulted, s/p Flagyl  and ceftriaxone  due to E. 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 S/p ERCP 03/15/2024 and 04/17/2024, brush biopsy sent for cytology.  No acute procedural complications suspected. Cytology : Few atypical cells, favor reactive CT abdomen and pelvis nonacute GI consulted to follow 7/9 consulted IR for biliary drain exchange and repeat brush biopsy for adequate sampling to rule out malignancy 7/10 patient was feeling weak so he refused IR for the procedure today, patient was consulted and we will get it done tomorrow a.m. if he feels stable 7/13 Patient continues to decline procedure for now     # Acute respiratory failure with hypoxia # HCAP (healthcare-associated pneumonia) Sepsis criteria include fever, tachycardia and tachypnea with leukocytosis and lactic acidosis with multifocal pneumonia Possible aspiration during sedation for procedure given timing of onset of symptoms Patient was hypoxic to 62% and tachypneic to the 30s requiring 5 L  to maintain sats in the high 90s S/p cefepime , vancomycin  and Flagyl , Sepsis fluids SLP consult Patient is at high risk of clinical deterioration Continue IV Zosyn  per ID recommendation    # AKI (acute kidney injury) due to septic shock. Resolved  Creatinine improved with IV fluid Monitor renal function and avoid  nephrotoxins   # Hypokalemia, potassium repleted. # Hypomagnesemia, mag repleted. # Hypophosphatemia, Phos repleted. Monitor electrolytes and replete as needed.   # Metabolic acidosis Bicarb 100 mEq IV one-time dose given Monitor BMP daily     # h/o STEMI S/p cath with stent to RCA.  Held home meds Resume aspirin  when stable after the procedure Troponin 89 possible demand ischemia Repeat TTE LVEF 50 to 55%, grade 1 diastolic dysfunction.  No WMA, no any other significant findings     # HFrEF (heart failure with reduced ejection fraction) (HCC) Dilated idiopathic cardiomyopathy (HCC) Hold carvedilol , spironolactone  in the setting of sepsis Daily weights   # Controlled type 2 diabetes mellitus without complication  Sliding scale insulin  coverage   # Pulmonary nodule, incidental finding. CT chest: Scattered nodules in the lungs, largest in the right middle lung measuring 7 mm diameter. Non-contrast chest CT at 3-6 months is recommended. If the nodules are stable at time of repeat CT, then future CT at 18-24 months (from today's scan) is considered optional for low-risk patients, but is recommended for high-risk patients. Recommended to follow with PCP and pulmonary as an outpatient. Patient is already following oncologist   # BPH, continued Rapaflo  8 mg home dose   # Vitamin D  deficiency: started vitamin D  50,000 units p.o. weekly, follow with PCP to repeat vitamin D  level after 3 to 6 months.   # GERD, started PPI twice daily and Maalox as needed    Body mass index is 27.92 kg/m.  Interventions:     Subjective: No significant events overnight.  Patient feels feeling improvement in his epigastric, midsternal chest burning.  Currently pain is 0-1/10.  Denied any other complaints. Patient was asking about discharge plan.  Patient was advised that we will wait for final recommendation from the ID tomorrow and then decide for DC plan.   Physical Exam: Vitals:   04/22/24  2357 04/23/24 0309 04/23/24 0357 04/23/24 1118  BP: 120/68  118/70 124/69  Pulse: 67  69 75  Resp: 19  18 16   Temp: 98 F (36.7 C) 97.8 F (36.6 C) 97.7 F (36.5 C) 98.3 F (36.8 C)  TempSrc:  Oral  Oral  SpO2: 97%  99% 98%  Weight:      Height:       General: Sleeping but arouses easily Lungs: Clear to auscultation bilaterally.  Heart: Regular rate and rhythm, no murmur, rub or gallop. Abdomen: Soft, rt perc biliary drain Extremities: atraumatic, no cyanosis. No edema. No clubbing Skin: No rashes or lesions. Or bruising Lymph: Cervical, supraclavicular normal. Neurologic: Grossly non-focal, weak   Data Reviewed: Lipase 63, phosphorus 2.1 Labs reviewed  Family Communication: Plan of care discussed with patient at the bedside.  He verbalizes understanding and agrees with the plan  Disposition: Status is: Inpatient Remains inpatient appropriate because: On IV antibiotics  Planned Discharge Destination: TBD    Time spent:  45  minutes  Author: Elvan Sor, MD 04/23/2024 1:56 PM  For on call review [email or web address].

## 2024-04-24 ENCOUNTER — Other Ambulatory Visit: Payer: Self-pay

## 2024-04-24 DIAGNOSIS — B9620 Unspecified Escherichia coli [E. coli] as the cause of diseases classified elsewhere: Secondary | ICD-10-CM | POA: Diagnosis not present

## 2024-04-24 DIAGNOSIS — K831 Obstruction of bile duct: Secondary | ICD-10-CM | POA: Diagnosis not present

## 2024-04-24 DIAGNOSIS — R7881 Bacteremia: Secondary | ICD-10-CM | POA: Diagnosis not present

## 2024-04-24 LAB — BASIC METABOLIC PANEL WITH GFR
Anion gap: 10 (ref 5–15)
BUN: 16 mg/dL (ref 8–23)
CO2: 26 mmol/L (ref 22–32)
Calcium: 8.1 mg/dL — ABNORMAL LOW (ref 8.9–10.3)
Chloride: 102 mmol/L (ref 98–111)
Creatinine, Ser: 0.95 mg/dL (ref 0.61–1.24)
GFR, Estimated: >60 mL/min (ref >60)
Glucose, Bld: 121 mg/dL — ABNORMAL HIGH (ref 70–99)
Potassium: 3.8 mmol/L (ref 3.5–5.1)
Sodium: 138 mmol/L (ref 135–145)

## 2024-04-24 LAB — HEPATIC FUNCTION PANEL
ALT: 31 U/L (ref 0–44)
AST: 23 U/L (ref 15–41)
Albumin: 2.3 g/dL — ABNORMAL LOW (ref 3.5–5.0)
Alkaline Phosphatase: 106 U/L (ref 38–126)
Bilirubin, Direct: 0.5 mg/dL — ABNORMAL HIGH (ref 0.0–0.2)
Indirect Bilirubin: 0.7 mg/dL (ref 0.3–0.9)
Total Bilirubin: 1.2 mg/dL (ref 0.0–1.2)
Total Protein: 5.6 g/dL — ABNORMAL LOW (ref 6.5–8.1)

## 2024-04-24 LAB — MAGNESIUM: Magnesium: 1.8 mg/dL (ref 1.7–2.4)

## 2024-04-24 LAB — GLUCOSE, CAPILLARY
Glucose-Capillary: 148 mg/dL — ABNORMAL HIGH (ref 70–99)
Glucose-Capillary: 121 mg/dL — ABNORMAL HIGH (ref 70–99)
Glucose-Capillary: 126 mg/dL — ABNORMAL HIGH (ref 70–99)
Glucose-Capillary: 115 mg/dL — ABNORMAL HIGH (ref 70–99)

## 2024-04-24 LAB — CBC
HCT: 34.8 % — ABNORMAL LOW (ref 39.0–52.0)
Hemoglobin: 12.1 g/dL — ABNORMAL LOW (ref 13.0–17.0)
MCH: 32 pg (ref 26.0–34.0)
MCHC: 34.8 g/dL (ref 30.0–36.0)
MCV: 92.1 fL (ref 80.0–100.0)
Platelets: 204 K/uL (ref 150–400)
RBC: 3.78 MIL/uL — ABNORMAL LOW (ref 4.22–5.81)
RDW: 15.3 % (ref 11.5–15.5)
WBC: 8.2 K/uL (ref 4.0–10.5)
nRBC: 0 % (ref 0.0–0.2)

## 2024-04-24 LAB — PHOSPHORUS: Phosphorus: 2.4 mg/dL — ABNORMAL LOW (ref 2.5–4.6)

## 2024-04-24 LAB — LIPASE, BLOOD: Lipase: 78 U/L — ABNORMAL HIGH (ref 11–51)

## 2024-04-24 MED ORDER — ERTAPENEM IV (FOR PTA / DISCHARGE USE ONLY): 1.0000 g | INTRAVENOUS | 0 refills | Status: DC

## 2024-04-24 MED ORDER — K PHOS MONO-SOD PHOS DI & MONO 155-852-130 MG PO TABS
500.0000 mg | ORAL_TABLET | Freq: Three times a day (TID) | ORAL | Status: DC
  Administered 2024-04-24 (×2): 500 mg via ORAL
  Filled 2024-04-24 (×3): qty 2

## 2024-04-24 MED ORDER — PANTOPRAZOLE SODIUM 40 MG PO TBEC
DELAYED_RELEASE_TABLET | ORAL | 0 refills | Status: AC
  Filled 2024-04-24: qty 90, 60d supply, fill #0

## 2024-04-24 MED ORDER — ACETAMINOPHEN 325 MG PO TABS: 650.0000 mg | ORAL_TABLET | Freq: Four times a day (QID) | ORAL | Status: DC | PRN

## 2024-04-24 MED ORDER — ALUM & MAG HYDROXIDE-SIMETH 200-200-20 MG/5ML PO SUSP
15.0000 mL | Freq: Four times a day (QID) | ORAL | 0 refills | Status: AC | PRN
  Filled 2024-04-24: qty 355, 6d supply, fill #0

## 2024-04-24 MED ORDER — SODIUM CHLORIDE 0.9% FLUSH: 10.0000 mL | INTRAVENOUS | Status: DC | PRN

## 2024-04-24 MED ORDER — SODIUM CHLORIDE 0.9 % IV SOLN
1.0000 g | Freq: Every day | INTRAVENOUS | Status: DC
  Administered 2024-04-24: 1 g via INTRAVENOUS
  Filled 2024-04-24: qty 1

## 2024-04-24 MED ORDER — SODIUM CHLORIDE 0.9% FLUSH: 10.0000 mL | Freq: Two times a day (BID) | INTRAVENOUS | Status: DC

## 2024-04-24 MED ORDER — ACETAMINOPHEN 650 MG RE SUPP: 650.0000 mg | Freq: Four times a day (QID) | RECTAL | Status: DC | PRN

## 2024-04-24 MED ORDER — VITAMIN D (ERGOCALCIFEROL) 1.25 MG (50000 UNIT) PO CAPS
50000.0000 [IU] | ORAL_CAPSULE | ORAL | 0 refills | Status: DC
  Filled 2024-04-24: qty 12, 84d supply, fill #0

## 2024-04-24 NOTE — Progress Notes (Signed)
 Date of Admission:  04/17/2024    ID: Casey Peters is a 78 y.o. male  Principal Problem:   HCAP (healthcare-associated pneumonia) Active Problems:   Biliary obstruction   Severe sepsis (HCC)   Acute respiratory failure with hypoxia (HCC)   AKI (acute kidney injury) (HCC)   Septic shock (HCC)   E coli bacteremia    Subjective: Doing much better No fever Out of  ICU Eating well  Medications:   alum & mag hydroxide-simeth  30 mL Oral Once   Chlorhexidine  Gluconate Cloth  6 each Topical Daily   enoxaparin  (LOVENOX ) injection  40 mg Subcutaneous QHS   feeding supplement (GLUCERNA SHAKE)  237 mL Oral TID BM   guaiFENesin   600 mg Oral BID   insulin  aspart  0-9 Units Subcutaneous TID AC & HS   pantoprazole   40 mg Oral BID   Followed by   NOREEN ON 04/26/2024] pantoprazole   40 mg Oral Daily   phosphorus  500 mg Oral TID   silodosin   8 mg Oral Daily   testosterone   5 g Transdermal Daily   Vitamin D  (Ergocalciferol )  50,000 Units Oral Q7 days    Objective: Vital signs in last 24 hours: Patient Vitals for the past 24 hrs:  BP Temp Pulse Resp SpO2  04/24/24 1130 120/77 98.6 F (37 C) 71 -- 97 %  04/24/24 0757 120/77 98.5 F (36.9 C) 72 18 96 %  04/24/24 0338 132/83 98 F (36.7 C) 66 18 98 %  04/23/24 2352 112/67 98.3 F (36.8 C) 77 18 96 %  04/23/24 1941 (!) 107/57 98.7 F (37.1 C) 79 18 97 %  04/23/24 1744 -- -- -- 20 --      PHYSICAL EXAM:  General: alert, no distress Lungs:b/l air entry- Heart: Regular rate and rhythm, no murmur, rub or gallop. Abdomen: Soft, rt perc biliary drain Extremities: atraumatic, no cyanosis. No edema. No clubbing Skin: No rashes or lesions. Or bruising Lymph: Cervical, supraclavicular normal. Neurologic: Grossly non-focal  Lab Results    Latest Ref Rng & Units 04/24/2024    7:07 AM 04/23/2024    2:49 AM 04/22/2024    3:16 AM  CBC  WBC 4.0 - 10.5 K/uL 8.2  7.1  6.7   Hemoglobin 13.0 - 17.0 g/dL 87.8  88.6  87.4    Hematocrit 39.0 - 52.0 % 34.8  33.6  36.3   Platelets 150 - 400 K/uL 204  168  161        Latest Ref Rng & Units 04/24/2024    7:07 AM 04/23/2024    2:49 AM 04/22/2024    3:16 AM  CMP  Glucose 70 - 99 mg/dL 878  882  842   BUN 8 - 23 mg/dL 16  17  20    Creatinine 0.61 - 1.24 mg/dL 9.04  8.88  8.81   Sodium 135 - 145 mmol/L 138  137  139   Potassium 3.5 - 5.1 mmol/L 3.8  3.6  4.0   Chloride 98 - 111 mmol/L 102  105  101   CO2 22 - 32 mmol/L 26  27  28    Calcium  8.9 - 10.3 mg/dL 8.1  7.6  8.2   Total Protein 6.5 - 8.1 g/dL 5.6  5.7  5.7   Total Bilirubin 0.0 - 1.2 mg/dL 1.2  1.1  1.6   Alkaline Phos 38 - 126 U/L 106  81  94   AST 15 - 41 U/L 23  25  43   ALT 0 - 44 U/L 31  33  44       Microbiology:  Studies/Results: No results found.    Assessment/Plan: 78 year old male with history of biliary obstruction E. coli bacteremia with sepsis following ERCP done on monday On  zosyn  - previously was on ceftriaxone  Repeat blood culture NG so far Leucocytosis resolved ? Biliary obstruction.  Status post ERCP and stent replacement. Also has biliary drain Brushings have been sent andcytology shows reactive cells Increased CA 19-9    CAD status post CABG   Diabetes mellitus   Hypertension   AKI secondary to the infection   History of urethral strictures with frequent UTIs.  Was on suppressive therapy with nitrofurantoin    He will need 1 more week of antibiotic- no PO option so will send him home on IV ertapenem  Explained to him and his wife to get the biliary brushings thru IR for diagnosis of biliary strictures He is planning to go to [facility]  He is being discharged today OPAT orders placed

## 2024-04-24 NOTE — Progress Notes (Signed)

## 2024-04-24 NOTE — Progress Notes (Signed)
 Physical Therapy Treatment Patient Details Name: Casey Peters MRN: [id number] DOB: 10/12/1946 Today's Date: 04/24/2024   History of Present Illness Casey Peters is a 78 y.o. male with medical history significant for dilated cardiomyopathy, CAD RCA stent in 2023, HTN, HLD, IIDM, BPH with history of recurrent UTIs on suppressive antibiotics, recently hospitalized with painless jaundice, s/p ERCP  with biliary stent placement in 03/15/24, exchanged yesterday 04/17/24, with brushings sent to cytology who is being admitted for sepsis secondary to multifocal pneumonia.    PT Comments  Pt ready for session.  Stated he was up earlier but back to bed due to sore back,. He does ask for min a for bed mobility for comfort.  Sitting steady EOB.  He stands to RW with cga x 1 and is able to progress gait 160' with no scissoring or tremors noted.  He does endorse general fatigue but overall good improvement of gait quality.  Pt feels comfortable with discharge home and stated he has all equipment needed.  Will update recommendations.     If plan is discharge home, recommend the following: A little help with walking and/or transfers;A little help with bathing/dressing/bathroom;Assistance with cooking/housework;Help with stairs or ramp for entrance;Assist for transportation   Can travel by private vehicle        Equipment Recommendations       Recommendations for Other Services       Precautions / Restrictions Precautions Precautions: Fall Recall of Precautions/Restrictions: Intact Restrictions Weight Bearing Restrictions Per Provider Order: No     Mobility  Bed Mobility Overal bed mobility: Needs Assistance Bed Mobility: Supine to Sit     Supine to sit: Min assist, HOB elevated       Patient Response: Cooperative  Transfers Overall transfer level: Needs assistance Equipment used: Rolling walker (2 wheels) Transfers: Sit to/from Stand Sit to Stand: Contact guard assist                 Ambulation/Gait Ambulation/Gait assistance: Contact guard assist Gait Distance (Feet): 160 Feet Assistive device: Rolling walker (2 wheels) Gait Pattern/deviations: Step-through pattern, Decreased stride length, Scissoring Gait velocity: decreased     General Gait Details: significant improvement in gait.   Stairs             Wheelchair Mobility     Tilt Bed Tilt Bed Patient Response: Cooperative  Modified Rankin (Stroke Patients Only)       Balance Overall balance assessment: Needs assistance Sitting-balance support: Feet supported Sitting balance-Leahy Scale: Normal     Standing balance support: Bilateral upper extremity supported, Reliant on assistive device for balance Standing balance-Leahy Scale: Good                              Communication Communication Communication: No apparent difficulties  Cognition Arousal: Alert Behavior During Therapy: WFL for tasks assessed/performed, Flat affect   PT - Cognitive impairments: No apparent impairments                         Following commands: Intact      Cueing Cueing Techniques: Verbal cues, Gestural cues  Exercises      General Comments        Pertinent Vitals/Pain Pain Assessment Pain Assessment: No/denies pain    Home Living  Prior Function            PT Goals (current goals can now be found in the care plan section) Progress towards PT goals: Progressing toward goals    Frequency    Min 2X/week      PT Plan      Co-evaluation              AM-PAC PT 6 Clicks Mobility   Outcome Measure  Help needed turning from your back to your side while in a flat bed without using bedrails?: A Little Help needed moving from lying on your back to sitting on the side of a flat bed without using bedrails?: A Little Help needed moving to and from a bed to a chair (including a wheelchair)?: None Help needed  standing up from a chair using your arms (e.g., wheelchair or bedside chair)?: None Help needed to walk in hospital room?: A Little Help needed climbing 3-5 steps with a railing? : A Little 6 Click Score: 20    End of Session Equipment Utilized During Treatment: Gait belt Activity Tolerance: Patient tolerated treatment well Patient left: in chair;with call bell/phone within reach;with family/visitor present Nurse Communication: Mobility status PT Visit Diagnosis: Unsteadiness on feet (R26.81);Muscle weakness (generalized) (M62.81);History of falling (Z91.81);Other abnormalities of gait and mobility (R26.89)     Time: 8885-8874 PT Time Calculation (min) (ACUTE ONLY): 11 min  Charges:    $Gait Training: 8-22 mins PT General Charges $$ ACUTE PT VISIT: 1 Visit                   Lauraine Gills, PTA 04/24/24, 11:37 AM

## 2024-04-24 NOTE — Treatment Plan (Signed)
 Diagnosis: Ecoli bacteremia Biliary obstruction Ascending cholangitis Baseline Creatinine <1   Allergies  Allergen Reactions   Citalopram Nausea And Vomiting    Dizziness, unsteady, balanced issues   Sulfamethoxazole -Trimethoprim      Fatigue and muscle ache Other reaction(s): Other (See Comments) Fatigue and muscle ache   Trimethoprim  Other (See Comments)    Fatigue, hypotension    OPAT Orders Discharge antibiotics: Ertapenem  1 gram Iv every 24 hrs Duration: 2 weeks End Date: 05/01/24  PIC/midline  Care Per Protocol:  Labs weekly while on IV antibiotics: X__ CBC with differential  _X_ CMP   _X_ Please pull midline /PIC at completion of IV antibiotics on 05/02/24   Fax weekly lab results  promptly to [phone]  Clinic Follow Up Appt:with infectious disease not needed   Call [phone] with any critical values or questions

## 2024-04-24 NOTE — TOC Progression Note (Signed)
 Transition of Care [facility]) - Progression Note    Patient Details  Name: Casey Peters MRN: [id number] Date of Birth: 25-Mar-1946  Transition of [facility]) CM/SW Contact  Tomasa JAYSON Childes, RN Phone Number: 04/24/2024, 3:06 PM  Clinical Narrative:    Spoke with patient regarding HH. Patient gave verbal permission to speak with his wife.  RNCM advised patient's wife, Casey Peters of need for home infusions, and HH RN/ PT/ and OT. She is agreeable and does not have a choice. She was advised referral would be sent to [facility] or [place]. Wife stated she and her daughter will help provided care for infusions. She was advised Holley Herring from [facility] will come to the hospital today to teach them infusion care.  Referral sent and accepted by Darleene from [place] [facility] PT/ RN     Expected Discharge Plan: Home w Home Health Services Barriers to Discharge: Continued Medical Work up  Expected Discharge Plan and Services   Discharge Planning Services: CM Consult Post Acute Care Choice: Home Health Living arrangements for the past 2 months: Single Family Home                                       Social Determinants of Health (SDOH) Interventions SDOH Screenings   Food Insecurity: No Food Insecurity (04/19/2024)  Housing: Low Risk  (04/19/2024)  Transportation Needs: No Transportation Needs (04/19/2024)  Utilities: Not At Risk (04/19/2024)  Depression (PHQ2-9): Low Risk  (03/28/2024)  Social Connections: Socially Integrated (04/19/2024)  Tobacco Use: High Risk (04/17/2024)    Readmission Risk Interventions     No data to display

## 2024-04-24 NOTE — Progress Notes (Signed)
 PHARMACY CONSULT NOTE FOR:  OUTPATIENT  PARENTERAL ANTIBIOTIC THERAPY (OPAT)  Indication: E coli bacteremia and biliary obstruction Regimen: Ertapenem  1gm IV q24h End date: 05/01/2024  -Labs - Once weekly:  CBC/D and CMP -Fax weekly lab results  promptly to [phone] -Please pull midline at completion of IV antibiotics on 05/02/24 -Call [phone] with any critical values or questions  IV antibiotic discharge orders are pended. To discharging provider:  please sign these orders via discharge navigator,  Select New Orders & click on the button choice - Manage This Unsigned Work.     Thank you for allowing pharmacy to be a part of this patient's care.  Nithila Sumners, PharmD, BCPS, BCIDP Work Cell: [phone] 04/24/2024 2:20 PM

## 2024-04-24 NOTE — Progress Notes (Signed)
 Occupational Therapy Treatment Patient Details Name: Casey Peters MRN: [id number] DOB: 1946-08-13 Today's Date: 04/24/2024   History of present illness Casey Peters is a 78 y.o. male with medical history significant for dilated cardiomyopathy, CAD RCA stent in 2023, HTN, HLD, IIDM, BPH with history of recurrent UTIs on suppressive antibiotics, recently hospitalized with painless jaundice, s/p ERCP  with biliary stent placement in 03/15/24, exchanged yesterday 04/17/24, with brushings sent to cytology who is being admitted for sepsis secondary to multifocal pneumonia.   OT comments  Pt seen for OT Tx. Pt received in the recliner after recent PT session. Family present and supportive. Pt/family educated in ECS including activity pacing, work simplification, falls prevention, AE/DME, and recovery timelines to support realistic expectations for gradual return to PLOF. Pt/family verbalized understanding. Provided active listening throughout. Discharge rec updated to reflect progress. Continue to recommend skilled OT services to optimize safety/indep and return to PLOF for ADL and IADL participation.       If plan is discharge home, recommend the following:  A little help with walking and/or transfers;A little help with bathing/dressing/bathroom;Assist for transportation;Help with stairs or ramp for entrance;Assistance with cooking/housework   Equipment Recommendations  Other (comment) (shower chair - defer to [facility] to ensure optimal fit given shower size)    Recommendations for Other Services      Precautions / Restrictions Precautions Precautions: Fall Recall of Precautions/Restrictions: Intact Restrictions Weight Bearing Restrictions Per Provider Order: No       Mobility Bed Mobility               General bed mobility comments: NT in recliner    Transfers                         Balance                                           ADL  either performed or assessed with clinical judgement   ADL                                              Extremity/Trunk Assessment              Vision       Perception     Praxis     Communication Communication Communication: No apparent difficulties   Cognition Arousal: Alert Behavior During Therapy: WFL for tasks assessed/performed, Flat affect Cognition: No apparent impairments                                        Cueing      Exercises Other Exercises Other Exercises: Pt/family educated in ECS including activity pacing, work simplification, falls prevention, AE/DME, and recovery timelines to support realistic expectations for gradual return to PLOF.    Shoulder Instructions       General Comments      Pertinent Vitals/ Pain       Pain Assessment Pain Assessment: No/denies pain  Home Living  Prior Functioning/Environment              Frequency  Min 2X/week        Progress Toward Goals  OT Goals(current goals can now be found in the care plan section)  Progress towards OT goals: Progressing toward goals  Acute Rehab OT Goals Patient Stated Goal: go home OT Goal Formulation: With patient/family Time For Goal Achievement: 05/05/24 Potential to Achieve Goals: Good  Plan      Co-evaluation                 AM-PAC OT 6 Clicks Daily Activity     Outcome Measure   Help from another person eating meals?: None Help from another person taking care of personal grooming?: None Help from another person toileting, which includes using toliet, bedpan, or urinal?: A Little Help from another person bathing (including washing, rinsing, drying)?: A Little Help from another person to put on and taking off regular upper body clothing?: A Little Help from another person to put on and taking off regular lower body clothing?: A Little 6 Click Score:  20    End of Session    OT Visit Diagnosis: Unsteadiness on feet (R26.81);Other abnormalities of gait and mobility (R26.89);Muscle weakness (generalized) (M62.81)   Activity Tolerance Patient tolerated treatment well   Patient Left in chair;with call bell/phone within reach;with chair alarm set;with family/visitor present   Nurse Communication          Time: 8862-8793 OT Time Calculation (min): 29 min  Charges: OT General Charges $OT Visit: 1 Visit OT Treatments $Self Care/Home Management : 23-37 mins  Warren SAUNDERS., MPH, MS, OTR/L ascom [phone] 04/24/24, 1:17 PM

## 2024-04-24 NOTE — Discharge Summary (Signed)
 [facility] Discharge Summary   Patient: Casey Peters  PCP: Rudolpho Norleen BIRCH, MD  Date of admission: 04/17/2024   Date of discharge: 04/24/2024     Discharge Diagnoses:  Principal Problem:   HCAP (healthcare-associated pneumonia) Active Problems:   Severe sepsis (HCC)   Acute respiratory failure with hypoxia (HCC)   AKI (acute kidney injury) (HCC)   Biliary obstruction   Septic shock (HCC)   E coli bacteremia   Admitted From: Home Disposition:  Home with [facility]  Recommendations for Outpatient Follow-up:  Follow-up with PCP in 1 week Continue antibiotics for 7 days and then discontinue midline Follow-up with IR in 1 week to exchange biliary drain and brush biopsy for cytology to rule out malignancy Follow-up with Oncologist as per schedule Follow-up with GI as per schedule for biliary stent Follow up LABS/TEST:  As above   Follow-up Information     Rudolpho Norleen BIRCH, MD Follow up in 1 week(s).   Specialty: Internal Medicine Contact information: [address] [facility] [place] [address] [phone]                Diet recommendation: Regular diet  Activity: The patient is advised to gradually reintroduce usual activities, as tolerated  Discharge Condition: stable  Code Status: Full code   History of present illness: As per the H and P dictated on admission  Hospital Course:  Casey Peters is a 78 y.o. male with medical history significant for dilated cardiomyopathy, CAD RCA stent in 2023, HTN, HLD, IIDM, BPH with history of recurrent UTIs on suppressive antibiotics, recently hospitalized with painless jaundice, s/p ERCP  with biliary stent placement in 03/15/24, exchanged yesterday 04/17/24, with brushings sent to cytology who is being admitted for sepsis secondary to multifocal pneumonia.  History is given by wife at the bedside who states that he has been weak and staying in bed since his hospitalization a month ago  when he had the first stent placed, however yesterday several hours after his procedure he developed a cough and a fever which prompted the visit to the ED.  She states he has had nausea but without vomiting and has ongoing abdominal pain for over a month which has not changed.  Patient appears very frail, lethargic and unable to contribute to history. In the ED tachycardic to the 140s to 150s and tachypneic to the 30s.  BP initially 107/63 but improving to the 160s following hydration.  O2 sat initially in the 90s on room air but desatted down to as low as 62 requiring 5 L O2 via nasal cannula to maintain sats in the high 90s. Labs notable for WBC 18,000 with lactic acid 1.3-->2.3.  Creatinine 1.45 up from baseline of 0.76 a month ago.  LFTs reassuring compared to prior.  BNP normal.  EKG with sinus tachycardia at 147 CT abdomen and pelvis showing postsurgical changes with drains in place and no acute findings.  And CTA PE protocol negative for PE but showing patchy peripheral infiltrates possibly representing multifocal pneumonia. Patient given sepsis fluid bolus and started on Zosyn  and vancomycin  for possible intra-abdominal source. Admission requested    Assessment and Plan:   # Septic shock secondary to E. coli bacteremia and HCAP S/p 3 L fluid bolus given.  7/8 S/p Levophed  given for 6 hours then patient was transferred to ICU, downgraded in the evening to [facility] service on 7/8 S/p Maintenance IV fluid. No fluid overload in view of history of HFrEF. S/p  cefepime , Flagyl  and vancomycin . ID consulted, s/p Flagyl  and ceftriaxone  due to E. coli bacteremia but has been switched to Zosyn  due to fever with a Tmax of 103  Repeat blood culture is sterile Patient was cleared by ID to start ertapenem  for 2 weeks and PICC line was inserted.  Home infusion set up was done.  Last day of antibiotics will be 7/21.  Patient was recommended to follow with PCP, ID and IR.      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 S/p ERCP 03/15/2024 and 04/17/2024, brush biopsy sent for cytology.  No acute procedural complications suspected. Cytology : Few atypical cells, favor reactive CT abdomen and pelvis nonacute. GI was consulted  7/9 consulted IR for biliary drain exchange and repeat brush biopsy for adequate sampling to rule out malignancy 7/10 patient was feeling weak so he refused IR for the procedure today, patient was consulted and we will get it done tomorrow a.m. if he feels stable 7/13 Patient continues to decline procedure for now 7/14 patient was advised to follow-up with IR in 1 week.    # Acute respiratory failure with hypoxia. Resolved  # HCAP (healthcare-associated pneumonia) Sepsis criteria include fever, tachycardia and tachypnea with leukocytosis and lactic acidosis with multifocal pneumonia Possible aspiration during sedation for procedure given timing of onset of symptoms. Patient was hypoxic to 62% and tachypneic to the 30s requiring 5 L to maintain sats in the high 90s S/p cefepime , vancomycin  and Flagyl , Sepsis fluids. SLP consult. Patient was at high risk of clinical deterioration S/p IV Zosyn , switched to ertapenem  on discharge as per ID.    # AKI (acute kidney injury) due to septic shock. Resolved  Creatinine improved with IV fluid.continue oral hydration.     # Hypokalemia, potassium repleted. # Hypomagnesemia, mag repleted.  Resolved # Hypophosphatemia, Phos repleted. # Metabolic acidosis. Resolved s/p  Bicarb 100 mEq IV x 1 dose # h/o STEMI S/p cath with stent to RCA.  Held home meds. Resume aspirin  when stable after the procedure Troponin 89 possible demand ischemia Repeat TTE LVEF 50 to 55%, grade 1 diastolic dysfunction.  No WMA, no any other significant findings. Resumed home medications on discharge.     # HFrEF (heart failure with reduced ejection fraction) (HCC) Dilated idiopathic cardiomyopathy (HCC) Hold carvedilol , spironolactone  in the setting of sepsis.  Held Coreg  and Aldactone  on discharge due to soft blood pressure.  Patient was advised to follow with PCP and then restarted when appropriate.   # Controlled type 2 diabetes mellitus without complication  S/p Sliding scale insulin  coverage.  Continue glimepiride home dose   # Pulmonary nodule, incidental finding. CT chest: Scattered nodules in the lungs, largest in the right middle lung measuring 7 mm diameter. Non-contrast chest CT at 3-6 months is recommended. If the nodules are stable at time of repeat CT, then future CT at 18-24 months (from today's scan) is considered optional for low-risk patients, but is recommended for high-risk patients. Recommended to follow with PCP and pulmonary as an outpatient. Patient is already following oncologist   # BPH, continued Rapaflo  8 mg home dose    # Vitamin D  deficiency: started vitamin D  50,000 units p.o. weekly, follow with PCP to repeat vitamin D  level after 3 to 6 months.   # GERD, started PPI twice daily and Maalox as  needed  Body mass index is 27.92 kg/m.  Nutrition Interventions:  - Patient was instructed, not to drive, operate heavy machinery, perform activities at heights, swimming or participation in water  activities or provide baby sitting services while on Pain, Sleep and Anxiety Medications; until his outpatient Physician has advised to do so again.  - Also recommended to not to take more than prescribed Pain, Sleep and Anxiety Medications.  Patient was seen by physical therapy, who recommended Home health, which was arranged. On the day of the discharge the patient's vitals were stable, and no other acute medical condition were reported by patient. the patient was felt safe to be discharge at Home with Home health.  Consultants: PCCM, GI, ID Procedures:  None  Discharge Exam: General: Appear in no distress, no Rash; Oral Mucosa Clear, moist. Cardiovascular: S1 and S2 Present, no Murmur, Respiratory: normal respiratory effort, Bilateral Air entry present and no Crackles, no wheezes Abdomen: BS present, Soft and no tenderness, biliary drain intact Extremities: no Pedal edema, no calf tenderness Neurology: alert and oriented to time, place, and person affect appropriate.  Filed Weights   04/17/24 2325 04/19/24 0510  Weight: 84.8 kg 90.8 kg   Vitals:   04/24/24 1130 04/24/24 1531  BP: 120/77 129/72  Pulse: 71 70  Resp:    Temp: 98.6 F (37 C) 98.7 F (37.1 C)  SpO2: 97% 99%    DISCHARGE MEDICATION: Allergies as of 04/24/2024       Reactions   Citalopram Nausea And Vomiting   Dizziness, unsteady, balanced issues   Sulfamethoxazole -trimethoprim     Fatigue and muscle ache Other reaction(s): Other (See Comments) Fatigue and muscle ache   Trimethoprim  Other (See Comments)   Fatigue, hypotension        Medication List     PAUSE taking these medications    carvedilol  6.25 MG tablet Wait to take this until your doctor or other care provider tells you to start again. Commonly known as: COREG  Take 6.25 mg by mouth 2 (two) times daily with a meal.   spironolactone  25 MG tablet Wait to take this until your doctor or other care provider tells you to start again. Commonly known as: ALDACTONE  Take 12.5 mg by mouth daily.       STOP taking these medications    ascorbic acid 500 MG tablet Commonly known as: VITAMIN C   cholecalciferol 25 MCG (1000 UNIT) tablet Commonly known as: VITAMIN D3   citalopram 20 MG tablet Commonly known as: CELEXA   metFORMIN  1000 MG tablet Commonly known as: GLUCOPHAGE    naloxone  4 MG/0.1ML Liqd nasal spray kit Commonly known as: NARCAN    nitrofurantoin  (macrocrystal-monohydrate) 100 MG capsule Commonly known as: MACROBID    omeprazole  20 MG capsule Commonly known as: PRILOSEC    Zinc Gluconate 30 MG Tabs       TAKE these medications    alum & mag hydroxide-simeth 200-200-20 MG/5ML suspension Commonly known as: MAALOX/MYLANTA Take 15 mLs by mouth every 6 (six) hours as needed for indigestion or heartburn.   aspirin  EC 81 MG tablet Take 81 mg by mouth daily.   ertapenem  IVPB Commonly known as: INVANZ  Inject 1 g into the vein daily for 6 days. Indication:  E coli bacteremia and biliary obstruction First Dose: Yes Last Day of Therapy:  05/01/2024 Labs - Once weekly:  CBC/D and CMP Fax weekly lab results  promptly to [phone] Method of administration: Mini-Bag Plus / Gravity Method of administration may be changed at the discretion  of home infusion pharmacist based upon assessment  of the patient and/or caregiver's ability to self-administer the medication ordered. Please pull midline at completion of IV antibiotics on 05/02/24 Call [phone] with any critical values or questions Start taking on: April 25, 2024   furosemide  40 MG tablet Commonly known as: LASIX  Take 40 mg by mouth daily.   glimepiride 2 MG tablet Commonly known as: AMARYL Take 2 mg by mouth. What changed: Another medication with the same name was removed. Continue taking this medication, and follow the directions you see here.   Januvia 50 MG tablet Generic drug: sitaGLIPtin Take 1 tablet by mouth daily.   ondansetron  8 MG tablet Commonly known as: ZOFRAN  One pill every 8 hours as needed for nausea/vomitting.   oxyCODONE  5 MG immediate release tablet Commonly known as: Oxy IR/ROXICODONE  Take 1 tablet (5 mg total) by mouth every 8 (eight) hours as needed for severe pain (pain score 7-10).   pantoprazole  40 MG tablet Commonly known as: PROTONIX  Take 1 tablet (40 mg total) by mouth 2 (two) times daily for 30 days, THEN 1 tablet (40 mg total) daily. Start taking on: April 24, 2024   senna 8.6 MG Tabs tablet Commonly known as: SENOKOT Take 1 tablet (8.6 mg total) by mouth  daily as needed for mild constipation.   silodosin  8 MG Caps capsule Commonly known as: RAPAFLO  Take 1 capsule (8 mg total) by mouth daily with breakfast.   testosterone  50 MG/5GM (1%) Gel Commonly known as: ANDROGEL  Place 5 g onto the skin daily. Please confirm with your cardiologist before resuming   Vitamin D  (Ergocalciferol ) 1.25 MG (50000 UNIT) Caps capsule Commonly known as: DRISDOL  Take 1 capsule (50,000 Units total) by mouth every 7 (seven) days. Start taking on: April 26, 2024               Discharge Care Instructions  (From admission, onward)           Start     Ordered   04/24/24 0000  Change dressing on IV access line weekly and PRN  (Home infusion instructions - [facility] )        04/24/24 1425           Allergies  Allergen Reactions   Citalopram Nausea And Vomiting    Dizziness, unsteady, balanced issues   Sulfamethoxazole -Trimethoprim      Fatigue and muscle ache Other reaction(s): Other (See Comments) Fatigue and muscle ache   Trimethoprim  Other (See Comments)    Fatigue, hypotension   Discharge Instructions     [facility] pharmacist to adjust dose for Vancomycin , Aminoglycosides and other anti-infective therapies as requested by physician.   Complete by: As directed    [facility] to provide Cath Flo 2mg    Complete by: As directed    Administer for PICC line occlusion and as ordered by physician for other access device issues.   Anaphylaxis Kit: Provided to treat any anaphylactic reaction to the medication being provided to the patient if First Dose or when requested by physician   Complete by: As directed    Epinephrine 1mg /ml vial / amp: Administer 0.3mg  (0.11ml) subcutaneously once for moderate to severe anaphylaxis, nurse to call physician and pharmacy when reaction occurs and call 911 if needed for immediate care   Diphenhydramine 50mg /ml IV vial: Administer 25-50mg  IV/IM PRN for first dose reaction,  rash, itching, mild reaction, nurse to call physician and pharmacy when reaction occurs   Sodium Chloride  0.9%  NS 500ml IV: Administer if needed for hypovolemic blood pressure drop or as ordered by physician after call to physician with anaphylactic reaction   Call MD for:  difficulty breathing, headache or visual disturbances   Complete by: As directed    Call MD for:  extreme fatigue   Complete by: As directed    Call MD for:  persistant dizziness or light-headedness   Complete by: As directed    Call MD for:  persistant nausea and vomiting   Complete by: As directed    Call MD for:  severe uncontrolled pain   Complete by: As directed    Call MD for:  temperature >100.4   Complete by: As directed    Change dressing on IV access line weekly and PRN   Complete by: As directed    Diet general   Complete by: As directed    Discharge instructions   Complete by: As directed    Follow-up with PCP in 1 week Continue antibiotics for 7 days and then discontinue midline Follow-up with IR in 1 week to exchange biliary drain and brush biopsy for cytology to rule out malignancy Follow-up with oncologist as per schedule Follow-up with GI as per schedule for biliary stent   Flush IV access with Sodium Chloride  0.9% and Heparin  10 units/ml or 100 units/ml   Complete by: As directed    Home infusion instructions - [facility]   Complete by: As directed    Instructions: Flush IV access with Sodium Chloride  0.9% and Heparin  10units/ml or 100units/ml   Change dressing on IV access line: Weekly and PRN   Instructions Cath Flo 2mg : Administer for PICC Line occlusion and as ordered by physician for other access device   [facility] pharmacist to adjust dose for: Vancomycin , Aminoglycosides and other anti-infective therapies as requested by physician   Increase activity slowly   Complete by: As directed    Method of administration may be changed at the discretion of home infusion  pharmacist based upon assessment of the patient and/or caregiver's ability to self-administer the medication ordered   Complete by: As directed    No wound care   Complete by: As directed        The results of significant diagnostics from this hospitalization (including imaging, microbiology, ancillary and laboratory) are listed below for reference.    Significant Diagnostic Studies: US  EKG SITE RITE Result Date: 04/24/2024 If Site Rite image not attached, placement could not be confirmed due to current cardiac rhythm.  ECHOCARDIOGRAM COMPLETE Result Date: 04/18/2024    ECHOCARDIOGRAM REPORT   Patient Name:   WILBERN PENNYPACKER Date of Exam: 04/18/2024 Medical Rec #:  [id number]            Height:       71.0 in Accession #:    [id number]           Weight:       187.0 lb Date of Birth:  1945/10/30            BSA:          2.049 m Patient Age:    78 years             BP:           86/53 mmHg Patient Gender: M                    HR:  99 bpm. Exam Location:  [facility] Procedure: 2D Echo, Color Doppler, Cardiac Doppler and Intracardiac            Opacification Agent (Both Spectral and Color Flow Doppler were            utilized during procedure). Indications:     CHF-Acute Systolic I50.21  History:         Patient has prior history of Echocardiogram examinations, most                  recent 12/28/2021. CHF.  Sonographer:     Ashley McNeely-Sloane Referring Phys:  [id number] ELVAN SOR Diagnosing Phys: Dwayne D Callwood MD IMPRESSIONS  1. Left ventricular ejection fraction, by estimation, is 50 to 55%. The left ventricle has low normal function. The left ventricle has no regional wall motion abnormalities. Left ventricular diastolic parameters are consistent with Grade I diastolic dysfunction (impaired relaxation).  2. Right ventricular systolic function is normal. The right ventricular size is normal.  3. The mitral valve is normal in structure. Trivial mitral valve regurgitation.  4. The aortic valve is  normal in structure. Aortic valve regurgitation is not visualized. FINDINGS  Left Ventricle: Left ventricular ejection fraction, by estimation, is 50 to 55%. The left ventricle has low normal function. The left ventricle has no regional wall motion abnormalities. Definity  contrast agent was given IV to delineate the left ventricular endocardial borders. Strain was performed and the global longitudinal strain is indeterminate. Global longitudinal strain performed but not reported based on interpreter judgement due to suboptimal tracking. The left ventricular internal cavity size was normal in size. There is borderline left ventricular hypertrophy. Left ventricular diastolic parameters are consistent with Grade I diastolic dysfunction (impaired relaxation). Right Ventricle: The right ventricular size is normal. No increase in right ventricular wall thickness. Right ventricular systolic function is normal. Left Atrium: Left atrial size was normal in size. Right Atrium: Right atrial size was normal in size. Pericardium: There is no evidence of pericardial effusion. Mitral Valve: The mitral valve is normal in structure. Trivial mitral valve regurgitation. MV peak gradient, 3.1 mmHg. The mean mitral valve gradient is 2.0 mmHg. Tricuspid Valve: The tricuspid valve is normal in structure. Tricuspid valve regurgitation is mild. Aortic Valve: The aortic valve is normal in structure. Aortic valve regurgitation is not visualized. Aortic valve mean gradient measures 2.0 mmHg. Aortic valve peak gradient measures 3.4 mmHg. Aortic valve area, by VTI measures 4.02 cm. Pulmonic Valve: The pulmonic valve was normal in structure. Pulmonic valve regurgitation is not visualized. Aorta: The ascending aorta was not well visualized. IAS/Shunts: No atrial level shunt detected by color flow Doppler. Additional Comments: 3D was performed not requiring image post processing on an independent workstation and was indeterminate.  LEFT VENTRICLE  PLAX 2D LVIDd:         5.10 cm     Diastology LVIDs:         3.13 cm     LV e' medial:    7.18 cm/s LV PW:         1.10 cm     LV E/e' medial:  10.8 LV IVS:        1.10 cm     LV e' lateral:   9.46 cm/s LVOT diam:     2.30 cm     LV E/e' lateral: 8.2 LV SV:         60 LV SV Index:   29 LVOT Area:     4.15 cm  LV Volumes (MOD) LV vol d, MOD A2C: 98.3 ml LV vol d, MOD A4C: 81.0 ml LV vol s, MOD A2C: 44.8 ml LV vol s, MOD A4C: 61.3 ml LV SV MOD A2C:     53.5 ml LV SV MOD A4C:     81.0 ml LV SV MOD BP:      41.5 ml RIGHT VENTRICLE RV Basal diam:  3.90 cm RV Mid diam:    3.20 cm RV S prime:     10.30 cm/s TAPSE (M-mode): 1.8 cm LEFT ATRIUM             Index        RIGHT ATRIUM           Index LA diam:        3.60 cm 1.76 cm/m   RA Area:     18.20 cm LA Vol (A2C):   42.2 ml 20.59 ml/m  RA Volume:   50.70 ml  24.74 ml/m LA Vol (A4C):   34.5 ml 16.84 ml/m LA Biplane Vol: 39.8 ml 19.42 ml/m  AORTIC VALVE                    PULMONIC VALVE AV Area (Vmax):    3.64 cm     PV Vmax:        0.87 m/s AV Area (Vmean):   3.53 cm     PV Vmean:       62.600 cm/s AV Area (VTI):     4.02 cm     PV VTI:         0.155 m AV Vmax:           92.00 cm/s   PV Peak grad:   3.1 mmHg AV Vmean:          64.200 cm/s  PV Mean grad:   2.0 mmHg AV VTI:            0.150 m      RVOT Peak grad: 2 mmHg AV Peak Grad:      3.4 mmHg AV Mean Grad:      2.0 mmHg LVOT Vmax:         80.70 cm/s LVOT Vmean:        54.500 cm/s LVOT VTI:          0.145 m LVOT/AV VTI ratio: 0.97  AORTA Ao Root diam: 3.60 cm Ao Asc diam:  3.10 cm MITRAL VALVE MV Area (PHT): 5.54 cm     SHUNTS MV Area VTI:   2.63 cm     Systemic VTI:  0.14 m MV Peak grad:  3.1 mmHg     Systemic Diam: 2.30 cm MV Mean grad:  2.0 mmHg     Pulmonic VTI:  0.108 m MV Vmax:       0.88 m/s MV Vmean:      61.5 cm/s MV Decel Time: 137 msec MV E velocity: 77.60 cm/s MV A velocity: 105.00 cm/s MV E/A ratio:  0.74 Dwayne D Callwood MD Electronically signed by Cara JONETTA Lovelace MD Signature Date/Time:  04/18/2024/10:22:11 PM    Final    US  RENAL Result Date: 04/18/2024 CLINICAL DATA:  [id number] AKI (acute kidney injury) (HCC) [id number] EXAM: RENAL / URINARY TRACT ULTRASOUND COMPLETE COMPARISON:  Mar 10, 2024 FINDINGS: Right Kidney: Renal measurements: 13.1 x 6.3 x 10.3 cm = volume: 273 mL. Normal echogenicity. 1.2 x 0.8 x 0.9 cm cyst in the upper pole. No hydronephrosis or nephrolithiasis. Left Kidney: Renal  measurements: 12 x 5.4 x 5.3 cm = volume: 181 mL. Normal echogenicity. A couple of small cysts in the left kidney measuring up to 1.6 x 1.4 x 1.6 cm. No hydronephrosis or nephrolithiasis. Bladder: Circumferential wall thickening with trabeculation. Abnormal postvoid residual of 121 mL. Other: None. IMPRESSION: 1. No hydronephrosis or nephrolithiasis. 2. Circumferential wall thickening of the urinary bladder, which in the setting of prostatomegaly, may represent changes of chronic bladder outlet obstruction. If there is concern for acute cystitis, correlation with urinalysis would be recommended. 3. Abnormal postvoid residual of 121 mL. Electronically Signed   By: Rogelia Myers M.D.   On: 04/18/2024 16:00   CT Angio Chest PE W/Cm &/Or Wo Cm Result Date: 04/18/2024 CLINICAL DATA:  Pulmonary embolus suspected with high probability. Fever developed after biliary procedure today. EXAM: CT ANGIOGRAPHY CHEST CT ABDOMEN AND PELVIS WITH CONTRAST TECHNIQUE: Multidetector CT imaging of the chest was performed using the standard protocol during bolus administration of intravenous contrast. Multiplanar CT image reconstructions and MIPs were obtained to evaluate the vascular anatomy. Multidetector CT imaging of the abdomen and pelvis was performed using the standard protocol during bolus administration of intravenous contrast. RADIATION DOSE REDUCTION: This exam was performed according to the departmental dose-optimization program which includes automated exposure control, adjustment of the mA and/or kV according to patient  size and/or use of iterative reconstruction technique. CONTRAST:  OMNIPAQUE  IOHEXOL  350 MG/ML SOLN COMPARISON:  PET CT 04/12/2024.  CT chest 08/14/2020 FINDINGS: CTA CHEST FINDINGS Cardiovascular: Technically adequate study with good opacification of the central and segmental pulmonary arteries. Moderate motion artifact. No focal filling defects are demonstrated. No evidence of significant pulmonary embolus. Normal heart size. No pericardial effusions. Normal caliber thoracic aorta. No dissection. Great vessel origins are patent. Mediastinum/Nodes: No enlarged mediastinal, hilar, or axillary lymph nodes. Thyroid gland, trachea, and esophagus demonstrate no significant findings. Lungs/Pleura: Patchy peripheral infiltrates in the lungs may represent scarring or multifocal pneumonia. Similar appearance to previous study. No pleural effusion or pneumothorax. 7 mm diameter nodule in the right middle lung, series 3, image 70. Additional smaller nodule seen in both lungs. Musculoskeletal: Degenerative changes in the spine. No acute bony abnormalities. Review of the MIP images confirms the above findings. CT ABDOMEN and PELVIS FINDINGS Hepatobiliary: Residual contrast material demonstrated within mildly dilated intrahepatic bile ducts in the left lobe. A percutaneous biliary drainage catheter is present with tip in the duodenum. No focal liver lesions are seen. Extrahepatic bile ducts are not dilated. Gas in the gallbladder is likely result of procedure. Pancreas: Unremarkable. No pancreatic ductal dilatation or surrounding inflammatory changes. Spleen: Normal in size without focal abnormality. Adrenals/Urinary Tract: Adrenal glands are unremarkable. Kidneys are normal, without renal calculi, focal lesion, or hydronephrosis. Bladder is unremarkable. Stomach/Bowel: Stomach, small bowel, and colon are not abnormally distended. No wall thickening or inflammatory changes. Appendix is not identified. Vascular/Lymphatic:  Aortic atherosclerosis. No enlarged abdominal or pelvic lymph nodes. Reproductive: Prostate gland is enlarged Other: No free air or free fluid in the abdomen. No loculated collections are identified. Abdominal wall musculature appears intact. Musculoskeletal: Degenerative changes in the spine. No acute bony abnormalities. Review of the MIP images confirms the above findings. IMPRESSION: 1. Postprocedural changes in the liver with percutaneous biliary drain in place. Tip is in the duodenum. Residual contrast material within mildly dilated left intrahepatic bile ducts. Gas in the gallbladder is also likely postprocedural. 2. No abscess or free air in the abdomen. 3. No evidence of significant pulmonary embolus. 4.  Patchy peripheral infiltrates in the lungs possibly representing multifocal pneumonia or scarring. 5. Scattered nodules in the lungs, largest in the right middle lung measuring 7 mm diameter. Non-contrast chest CT at 3-6 months is recommended. If the nodules are stable at time of repeat CT, then future CT at 18-24 months (from today's scan) is considered optional for low-risk patients, but is recommended for high-risk patients. This recommendation follows the consensus statement: Guidelines for Management of Incidental Pulmonary Nodules Detected on CT Images: From the Fleischner Society 2017; Radiology 2017; 284:228-243. 6. Aortic atherosclerosis. 7. 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 Electronically Signed   By: Elsie Gravely M.D.   On: 04/18/2024 02:13   CT ABDOMEN PELVIS W CONTRAST Result Date: 04/18/2024 CLINICAL DATA:  Pulmonary embolus suspected with high probability. Fever developed after biliary procedure today. EXAM: CT ANGIOGRAPHY CHEST CT ABDOMEN AND PELVIS WITH CONTRAST TECHNIQUE: Multidetector CT imaging of the chest was performed using the standard protocol during bolus administration of intravenous contrast. Multiplanar CT image reconstructions and MIPs were obtained to evaluate the vascular  anatomy. Multidetector CT imaging of the abdomen and pelvis was performed using the standard protocol during bolus administration of intravenous contrast. RADIATION DOSE REDUCTION: This exam was performed according to the departmental dose-optimization program which includes automated exposure control, adjustment of the mA and/or kV according to patient size and/or use of iterative reconstruction technique. CONTRAST:  OMNIPAQUE  IOHEXOL  350 MG/ML SOLN COMPARISON:  PET CT 04/12/2024.  CT chest 08/14/2020 FINDINGS: CTA CHEST FINDINGS Cardiovascular: Technically adequate study with good opacification of the central and segmental pulmonary arteries. Moderate motion artifact. No focal filling defects are demonstrated. No evidence of significant pulmonary embolus. Normal heart size. No pericardial effusions. Normal caliber thoracic aorta. No dissection. Great vessel origins are patent. Mediastinum/Nodes: No enlarged mediastinal, hilar, or axillary lymph nodes. Thyroid gland, trachea, and esophagus demonstrate no significant findings. Lungs/Pleura: Patchy peripheral infiltrates in the lungs may represent scarring or multifocal pneumonia. Similar appearance to previous study. No pleural effusion or pneumothorax. 7 mm diameter nodule in the right middle lung, series 3, image 70. Additional smaller nodule seen in both lungs. Musculoskeletal: Degenerative changes in the spine. No acute bony abnormalities. Review of the MIP images confirms the above findings. CT ABDOMEN and PELVIS FINDINGS Hepatobiliary: Residual contrast material demonstrated within mildly dilated intrahepatic bile ducts in the left lobe. A percutaneous biliary drainage catheter is present with tip in the duodenum. No focal liver lesions are seen. Extrahepatic bile ducts are not dilated. Gas in the gallbladder is likely result of procedure. Pancreas: Unremarkable. No pancreatic ductal dilatation or surrounding inflammatory changes. Spleen: Normal in size  without focal abnormality. Adrenals/Urinary Tract: Adrenal glands are unremarkable. Kidneys are normal, without renal calculi, focal lesion, or hydronephrosis. Bladder is unremarkable. Stomach/Bowel: Stomach, small bowel, and colon are not abnormally distended. No wall thickening or inflammatory changes. Appendix is not identified. Vascular/Lymphatic: Aortic atherosclerosis. No enlarged abdominal or pelvic lymph nodes. Reproductive: Prostate gland is enlarged Other: No free air or free fluid in the abdomen. No loculated collections are identified. Abdominal wall musculature appears intact. Musculoskeletal: Degenerative changes in the spine. No acute bony abnormalities. Review of the MIP images confirms the above findings. IMPRESSION: 1. Postprocedural changes in the liver with percutaneous biliary drain in place. Tip is in the duodenum. Residual contrast material within mildly dilated left intrahepatic bile ducts. Gas in the gallbladder is also likely postprocedural. 2. No abscess or free air in the abdomen. 3. No evidence of significant pulmonary embolus. 4.  Patchy peripheral infiltrates in the lungs possibly representing multifocal pneumonia or scarring. 5. Scattered nodules in the lungs, largest in the right middle lung measuring 7 mm diameter. Non-contrast chest CT at 3-6 months is recommended. If the nodules are stable at time of repeat CT, then future CT at 18-24 months (from today's scan) is considered optional for low-risk patients, but is recommended for high-risk patients. This recommendation follows the consensus statement: Guidelines for Management of Incidental Pulmonary Nodules Detected on CT Images: From the Fleischner Society 2017; Radiology 2017; 284:228-243. 6. Aortic atherosclerosis. 7. 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 Electronically Signed   By: Elsie Gravely M.D.   On: 04/18/2024 02:13   DG C-Arm 1-60 Min-No Report Result Date: 04/17/2024 Fluoroscopy was utilized by the requesting physician.  No  radiographic interpretation.   NM PET Image Initial (PI) Skull Base To Thigh (F-18 FDG) Addendum Date: 04/12/2024 ADDENDUM REPORT: 04/12/2024 11:23 ADDENDUM: Additional impression. There is some asymmetric uptake in the area of the sella turcica/pituitary. Dedicated brain imaging could be performed with MRI as clinically appropriate to exclude lesion. Please correlate for any history Electronically Signed   By: Ranell Bring M.D.   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 EXAM: NUCLEAR MEDICINE PET SKULL BASE TO THIGH TECHNIQUE: 10.5 mCi F-18 FDG was injected intravenously. Full-ring PET imaging was performed from the skull base to thigh after the radiotracer. CT data was obtained and used for attenuation correction and anatomic localization. Fasting blood glucose: 108 mg/dl COMPARISON:  MRI abdomen 03/14/2024. CT abdomen pelvis 03/10/2024. CTA chest 08/14/2020. FINDINGS: Mediastinal blood pool activity: SUV max 2.5 Liver activity: SUV max 3.2 NECK: There is no specific abnormal uptake identified in the neck including along lymph node change of the submandibular, posterior triangle or internal jugular regions. Near symmetric uptake of the visualized intracranial compartment. However there is some uptake in the area of the sella. Please correlate for any known history. Dedicated MRI could be performed clinically appropriate. Incidental CT findings: Visualized paranasal sinuses and mastoid air cells are clear. Left sided concha bullosa. The parotid glands, submandibular glands and thyroid gland are unremarkable. Scattered vascular calcifications. CHEST: No specific abnormal radiotracer uptake identified above blood pool in the axillary regions, hilum or mediastinum. There is a small focus of uptake within the left upper lobe with maximum SUV of 2.3. This is seen on PET image 38. There is no CT correlate to a nodule in this  location. Diameter of uptake approaches 6 mm. There is a nodule in the middle lobe however on CT image 54 measuring 5 mm which does not show any abnormal uptake and was present on the CT of 2021 demonstrating long-term stability. Incidental CT findings: Breathing motion. There is some linear opacity lung bases likely scar or atelectasis. Some wispy ground-glass areas are identified which could be scarring or fibrotic changes based on the prior CT scan. Heart is enlarged. Scattered vascular calcifications are seen including along the coronary arteries. Patulous esophagus. ABDOMEN/PELVIS: There is a focal area of uptake which is somewhat linear in configuration with maximum SUV value of 8.0 centered at the hilum of the liver extending to the left hepatic lobe which is in the location of the stricture seen on the previous MRI and may correspond to the area of neoplasm. There is a more subtle area of uptake identified towards the ampulla measuring maximum SUV of 6.2 but this is in the exact location of the stents.  One being a right hepatic lobe PTC in the other endoscopic stent extending towards the left. This could be more artifactual as there is no clear mass lesion in this location on previous exam. Elsewhere there is physiologic distribution radiotracer along the parenchymal organs, bowel and renal collecting systems. No clear abnormal nodal uptake identified. Incidental CT findings: S dated placement of biliary stents. The biliary ductal dilatation in the right hepatic lobe is improved. The left hepatic lobe is also slightly improved with some residual. There is atrophy of the left hepatic lobe lateral segment. Air in the nondilated gallbladder. Mild global atrophy of the pancreas. The spleen, adrenal glands are grossly preserved on this noncontrast attenuation correction CT. Renal cysts are again identified as on prior MRI. No abnormal calcifications seen within either kidney nor along the course of either ureter.  Underdistended urinary bladder. Heterogeneous enlarged prostate. Large bowel has a normal course and caliber with scattered stool. Colonic diverticula identified. Stomach is underdistended. Small bowel is nondilated. Of note the cecum resides in the anterior right hemipelvis. Scattered vascular calcifications. Normal caliber aorta and IVC. Few small scattered nodes identified in the abdomen pelvis, not pathologic by size criteria. Small fat containing inguinal hernias. SKELETON: No abnormal uptake along the visualized osseous structures. Incidental CT findings: Scattered diffuse degenerative changes identified. Curvature of the spine. IMPRESSION: Focal area of uptake identified in the hilum of the liver at the location of strictures seen by previous imaging consistent with area of neoplasm. No other areas of abnormal uptake at this time suggest additional areas of disease. Indwelling right hepatic lobe PTC and endoscopic left-sided stent. Some improvement of the intrahepatic biliary ductal dilatation with residual particularly along the left hepatic lobe. Left hepatic lobe is also atrophic. There is a small focus of asymmetric uptake along the left upper lobe of the lung without a corresponding lung nodule on CT scan. In the setting recommend a dedicated high-resolution CT scan for further spacer resolution to see if there is an underlying lesion versus a follow up CT in 3 months. Residual areas of scarring and fibrotic changes in a similar distribution to the abnormality on prior remote CT scan of the chest. There is a right-sided lung nodule which is stable since 2021 has no abnormal uptake. No additional follow-up of this specific nodule. Electronically Signed: By: Ranell Bring M.D. On: 04/12/2024 10:38    Microbiology: Recent Results (from the past 240 hours)  Blood culture (routine x 2)     Status: Abnormal   Collection Time: 04/17/24 11:29 PM   Specimen: Right Antecubital; Blood  Result Value Ref Range  Status   Specimen Description   Final    RIGHT ANTECUBITAL Performed at [facility], [address] [place], [place] [address]    Special Requests   Final    BOTTLES DRAWN AEROBIC AND ANAEROBIC Blood Culture adequate volume Performed at [facility], [address], [place], [place] [address]    Culture  Setup Time   Final    GRAM NEGATIVE RODS IN BOTH AEROBIC AND ANAEROBIC BOTTLES GRAM STAIN REVIEWED-AGREE WITH RESULT DRT CRITICAL RESULT CALLED TO, READ BACK BY AND VERIFIED WITH: TREY GREENWOOD AT 1020 04/18/24 RAM Performed at [facility], [address] [place], [place] [address]    Culture ESCHERICHIA COLI (A)  Final   Report Status 04/20/2024 FINAL  Final   Organism ID, Bacteria ESCHERICHIA COLI  Final      Susceptibility   Escherichia coli -  MIC*    AMPICILLIN >=32 RESISTANT Resistant     CEFEPIME  <=0.12 SENSITIVE Sensitive     CEFTAZIDIME 4 SENSITIVE Sensitive     CEFTRIAXONE  0.5 SENSITIVE Sensitive     CIPROFLOXACIN  >=4 RESISTANT Resistant     GENTAMICIN  <=1 SENSITIVE Sensitive     IMIPENEM 0.5 SENSITIVE Sensitive     TRIMETH /SULFA  <=20 SENSITIVE Sensitive     AMPICILLIN/SULBACTAM >=32 RESISTANT Resistant     PIP/TAZO <=4 SENSITIVE Sensitive ug/mL    CEFAZOLIN RESISTANT Resistant     * ESCHERICHIA COLI  Blood Culture ID Panel (Reflexed)     Status: Abnormal   Collection Time: 04/17/24 11:29 PM  Result Value Ref Range Status   Enterococcus faecalis NOT DETECTED NOT DETECTED Final   Enterococcus Faecium NOT DETECTED NOT DETECTED Final   Listeria monocytogenes NOT DETECTED NOT DETECTED Final   Staphylococcus species NOT DETECTED NOT DETECTED Final   Staphylococcus aureus (BCID) NOT DETECTED NOT DETECTED Final   Staphylococcus epidermidis NOT DETECTED NOT DETECTED Final   Staphylococcus lugdunensis NOT DETECTED NOT DETECTED Final   Streptococcus species NOT DETECTED NOT DETECTED Final   Streptococcus agalactiae NOT DETECTED NOT DETECTED  Final   Streptococcus pneumoniae NOT DETECTED NOT DETECTED Final   Streptococcus pyogenes NOT DETECTED NOT DETECTED Final   A.calcoaceticus-baumannii NOT DETECTED NOT DETECTED Final   Bacteroides fragilis NOT DETECTED NOT DETECTED Final   Enterobacterales DETECTED (A) NOT DETECTED Final    Comment: Enterobacterales represent a large order of gram negative bacteria, not a single organism. CRITICAL RESULT CALLED TO, READ BACK BY AND VERIFIED WITH: TREY GREENWOOD AT 1020 04/18/24 RAM    Enterobacter cloacae complex NOT DETECTED NOT DETECTED Final   Escherichia coli DETECTED (A) NOT DETECTED Final    Comment: RESULT CALLED TO, READ BACK BY AND VERIFIED WITH: TREY GREENWOOD AT 1020 04/18/24 RAM CRITICAL RESULT CALLED TO, READ BACK BY AND VERIFIED WITH:    Klebsiella aerogenes NOT DETECTED NOT DETECTED Final   Klebsiella oxytoca NOT DETECTED NOT DETECTED Final   Klebsiella pneumoniae NOT DETECTED NOT DETECTED Final   Proteus species NOT DETECTED NOT DETECTED Final   Salmonella species NOT DETECTED NOT DETECTED Final   Serratia marcescens NOT DETECTED NOT DETECTED Final   Haemophilus influenzae NOT DETECTED NOT DETECTED Final   Neisseria meningitidis NOT DETECTED NOT DETECTED Final   Pseudomonas aeruginosa NOT DETECTED NOT DETECTED Final   Stenotrophomonas maltophilia NOT DETECTED NOT DETECTED Final   Candida albicans NOT DETECTED NOT DETECTED Final   Candida auris NOT DETECTED NOT DETECTED Final   Candida glabrata NOT DETECTED NOT DETECTED Final   Candida krusei NOT DETECTED NOT DETECTED Final   Candida parapsilosis NOT DETECTED NOT DETECTED Final   Candida tropicalis NOT DETECTED NOT DETECTED Final   Cryptococcus neoformans/gattii NOT DETECTED NOT DETECTED Final   CTX-M ESBL NOT DETECTED NOT DETECTED Final   Carbapenem resistance IMP NOT DETECTED NOT DETECTED Final   Carbapenem resistance KPC NOT DETECTED NOT DETECTED Final   Carbapenem resistance NDM NOT DETECTED NOT DETECTED Final    Carbapenem resist OXA 48 LIKE NOT DETECTED NOT DETECTED Final   Carbapenem resistance VIM NOT DETECTED NOT DETECTED Final    Comment: Performed at [facility], [address] [place], [place] [address]  Blood culture (routine x 2)     Status: Abnormal   Collection Time: 04/18/24  2:46 AM   Specimen: BLOOD  Result Value Ref Range Status   Specimen Description   Final    BLOOD  RIGHT ARM Performed at [facility], [address] [place], [place] [address]    Special Requests   Final    BOTTLES DRAWN AEROBIC AND ANAEROBIC Blood Culture results may not be optimal due to an inadequate volume of blood received in culture bottles Performed at [facility], [address], [place], [place] [address]    Culture  Setup Time   Final    GRAM NEGATIVE RODS ANAEROBIC BOTTLE ONLY RESULT CALLED TO, READ BACK BY AND VERIFIED WITH: MOSE BLEW AT 1420 04/18/24 RAM Performed at [facility], [address] [place], [place] [address]    Culture (A)  Final    ESCHERICHIA COLI SUSCEPTIBILITIES PERFORMED ON PREVIOUS CULTURE WITHIN THE LAST 5 DAYS. Performed at [facility], [address] [place], [place] [address]    Report Status 04/20/2024 FINAL  Final  MRSA Next Gen by PCR, Nasal     Status: None   Collection Time: 04/18/24  8:09 PM   Specimen: Nasal Mucosa; Nasal Swab  Result Value Ref Range Status   MRSA by PCR Next Gen NOT DETECTED NOT DETECTED Final    Comment: (NOTE) The GeneXpert MRSA Assay (FDA approved for NASAL specimens only), is one component of a comprehensive MRSA colonization surveillance program. It is not intended to diagnose MRSA infection nor to guide or monitor treatment for MRSA infections. Test performance is not FDA approved in patients less than 71 years old. Performed at [facility], [address] [place], [place] [address]   Culture, blood (Routine X 2) w Reflex to ID Panel     Status: None (Preliminary  result)   Collection Time: 04/20/24  4:23 AM   Specimen: BLOOD  Result Value Ref Range Status   Specimen Description   Final    BLOOD BLOOD RIGHT ARM Performed at [facility], [address] [place], [place] [address]    Special Requests   Final    BOTTLES DRAWN AEROBIC AND ANAEROBIC Blood Culture adequate volume Performed at [facility], [address] [place], [place] [address]    Culture   Final    NO GROWTH 4 DAYS Performed at [facility], [address], [place], [place] [address]    Report Status PENDING  Incomplete  Culture, blood (Routine X 2) w Reflex to ID Panel     Status: None (Preliminary result)   Collection Time: 04/20/24  4:23 AM   Specimen: BLOOD  Result Value Ref Range Status   Specimen Description   Final    BLOOD BLOOD RIGHT HAND Performed at [facility], [address] [place], [place] [address]    Special Requests   Final    IN PEDIATRIC BOTTLE Blood Culture adequate volume Performed at [facility], [address], [place], [place] [address]    Culture   Final    NO GROWTH 4 DAYS Performed at [facility], [address] [place], [place] [address]    Report Status PENDING  Incomplete     Labs: CBC: Recent Labs  Lab 04/17/24 2330 04/18/24 0905 04/20/24 0423 04/21/24 0454 04/22/24 0316 04/23/24 0249 04/24/24 0707  WBC 18.7*   < > 9.3 6.4 6.7 7.1 8.2  NEUTROABS 15.8*  --   --   --   --   --   --   HGB 13.2   < > 11.4* 11.6* 12.5* 11.3* 12.1*  HCT 38.5*   < > 34.0*  33.8* 36.3* 33.6* 34.8*  MCV 94.4   < > 93.2 91.4 92.8 93.3 92.1  PLT 196   < > 139* 142* 161 168 204   < > = values in this interval not displayed.   Basic Metabolic Panel: Recent Labs  Lab 04/20/24 0423 04/21/24 0454 04/22/24 0316 04/23/24 0249 04/24/24 0707  NA 137 137 139 137 138  K 3.2* 3.4* 4.0 3.6 3.8  CL 104 101 101 105 102  CO2 24 26 28 27 26   GLUCOSE 121* 142* 157* 117* 121*  BUN 21 17 20 17 16    CREATININE 1.28* 1.10 1.18 1.11 0.95  CALCIUM  7.9* 7.8* 8.2* 7.6* 8.1*  MG 1.6* 1.9 1.9 1.9 1.8  PHOS 2.4* 2.1* 3.0 2.1* 2.4*   Liver Function Tests: Recent Labs  Lab 04/20/24 0423 04/21/24 0454 04/22/24 0316 04/23/24 0249 04/24/24 0707  AST 21 25 43* 25 23  ALT 40 35 44 33 31  ALKPHOS 79 76 94 81 106  BILITOT 1.5* 1.2 1.6* 1.1 1.2  PROT 5.6* 5.8* 5.7* 5.7* 5.6*  ALBUMIN 2.2* 2.3* 2.3* 2.2* 2.3*   Recent Labs  Lab 04/21/24 0454 04/23/24 0249 04/24/24 0707  LIPASE 60* 63* 78*  AMYLASE 45  --   --    No results for input(s): AMMONIA in the last 168 hours. Cardiac Enzymes: No results for input(s): CKTOTAL, CKMB, CKMBINDEX, TROPONINI in the last 168 hours. BNP (last 3 results) Recent Labs    04/17/24 2330  BNP 65.6   CBG: Recent Labs  Lab 04/23/24 1258 04/23/24 1733 04/23/24 2124 04/24/24 0755 04/24/24 1236  GLUCAP 164* 133* 137* 121* 126*    Time spent: 35 minutes  Signed:  Elvan Sor  [facility] 04/24/2024 3:57 PM

## 2024-04-25 LAB — CULTURE, BLOOD (ROUTINE X 2)
Culture: NO GROWTH
Culture: NO GROWTH
Special Requests: ADEQUATE
Special Requests: ADEQUATE

## 2024-04-26 ENCOUNTER — Inpatient Hospital Stay: Attending: Internal Medicine | Admitting: Hospice and Palliative Medicine

## 2024-04-26 ENCOUNTER — Emergency Department

## 2024-04-26 ENCOUNTER — Other Ambulatory Visit: Payer: Self-pay

## 2024-04-26 ENCOUNTER — Inpatient Hospital Stay
Admission: EM | Admit: 2024-04-26 | Discharge: 2024-04-28 | DRG: 864 | Disposition: A | Discharge status: Home / Self Care | Attending: Internal Medicine | Admitting: Internal Medicine

## 2024-04-26 DIAGNOSIS — E1142 Type 2 diabetes mellitus with diabetic polyneuropathy: Secondary | ICD-10-CM | POA: Diagnosis present

## 2024-04-26 DIAGNOSIS — D72829 Elevated white blood cell count, unspecified: Secondary | ICD-10-CM | POA: Diagnosis not present

## 2024-04-26 DIAGNOSIS — Z7982 Long term (current) use of aspirin: Secondary | ICD-10-CM | POA: Diagnosis not present

## 2024-04-26 DIAGNOSIS — I110 Hypertensive heart disease with heart failure: Secondary | ICD-10-CM | POA: Diagnosis present

## 2024-04-26 DIAGNOSIS — I2510 Atherosclerotic heart disease of native coronary artery without angina pectoris: Secondary | ICD-10-CM | POA: Diagnosis present

## 2024-04-26 DIAGNOSIS — A4151 Sepsis due to Escherichia coli [E. coli]: Secondary | ICD-10-CM | POA: Diagnosis not present

## 2024-04-26 DIAGNOSIS — Z7984 Long term (current) use of oral hypoglycemic drugs: Secondary | ICD-10-CM | POA: Diagnosis not present

## 2024-04-26 DIAGNOSIS — E119 Type 2 diabetes mellitus without complications: Secondary | ICD-10-CM

## 2024-04-26 DIAGNOSIS — K831 Obstruction of bile duct: Secondary | ICD-10-CM | POA: Diagnosis present

## 2024-04-26 DIAGNOSIS — R509 Fever, unspecified: Secondary | ICD-10-CM | POA: Diagnosis present

## 2024-04-26 DIAGNOSIS — Z9861 Coronary angioplasty status: Secondary | ICD-10-CM | POA: Diagnosis not present

## 2024-04-26 DIAGNOSIS — R978 Other abnormal tumor markers: Secondary | ICD-10-CM

## 2024-04-26 DIAGNOSIS — N400 Enlarged prostate without lower urinary tract symptoms: Secondary | ICD-10-CM | POA: Diagnosis present

## 2024-04-26 DIAGNOSIS — Z955 Presence of coronary angioplasty implant and graft: Secondary | ICD-10-CM | POA: Diagnosis not present

## 2024-04-26 DIAGNOSIS — I420 Dilated cardiomyopathy: Secondary | ICD-10-CM | POA: Diagnosis present

## 2024-04-26 DIAGNOSIS — I517 Cardiomegaly: Secondary | ICD-10-CM

## 2024-04-26 DIAGNOSIS — E785 Hyperlipidemia, unspecified: Secondary | ICD-10-CM | POA: Diagnosis present

## 2024-04-26 DIAGNOSIS — A419 Sepsis, unspecified organism: Principal | ICD-10-CM

## 2024-04-26 LAB — URINALYSIS, W/ REFLEX TO CULTURE (INFECTION SUSPECTED)
Bilirubin Urine: NEGATIVE
Glucose, UA: NEGATIVE mg/dL
Ketones, ur: NEGATIVE mg/dL
Leukocytes,Ua: NEGATIVE
Nitrite: NEGATIVE
Protein, ur: NEGATIVE mg/dL
Specific Gravity, Urine: 1.02 (ref 1.005–1.030)
pH: 5 (ref 5.0–8.0)

## 2024-04-26 LAB — CBC WITH DIFFERENTIAL/PLATELET
Abs Immature Granulocytes: 0.26 K/uL — ABNORMAL HIGH (ref 0.00–0.07)
Basophils Absolute: 0.1 K/uL (ref 0.0–0.1)
Basophils Relative: 0 %
Eosinophils Absolute: 0.1 K/uL (ref 0.0–0.5)
Eosinophils Relative: 1 %
HCT: 34.6 % — ABNORMAL LOW (ref 39.0–52.0)
Hemoglobin: 11.8 g/dL — ABNORMAL LOW (ref 13.0–17.0)
Immature Granulocytes: 2 %
Lymphocytes Relative: 8 %
Lymphs Abs: 1.4 K/uL (ref 0.7–4.0)
MCH: 32.2 pg (ref 26.0–34.0)
MCHC: 34.1 g/dL (ref 30.0–36.0)
MCV: 94.3 fL (ref 80.0–100.0)
Monocytes Absolute: 0.8 K/uL (ref 0.1–1.0)
Monocytes Relative: 5 %
Neutro Abs: 14.7 K/uL — ABNORMAL HIGH (ref 1.7–7.7)
Neutrophils Relative %: 84 %
Platelets: 324 K/uL (ref 150–400)
RBC: 3.67 MIL/uL — ABNORMAL LOW (ref 4.22–5.81)
RDW: 15.4 % (ref 11.5–15.5)
WBC: 17.4 K/uL — ABNORMAL HIGH (ref 4.0–10.5)
nRBC: 0 % (ref 0.0–0.2)

## 2024-04-26 LAB — COMPREHENSIVE METABOLIC PANEL WITH GFR
ALT: 38 U/L (ref 0–44)
AST: 33 U/L (ref 15–41)
Albumin: 2.5 g/dL — ABNORMAL LOW (ref 3.5–5.0)
Alkaline Phosphatase: 177 U/L — ABNORMAL HIGH (ref 38–126)
Anion gap: 11 (ref 5–15)
BUN: 16 mg/dL (ref 8–23)
CO2: 25 mmol/L (ref 22–32)
Calcium: 8 mg/dL — ABNORMAL LOW (ref 8.9–10.3)
Chloride: 101 mmol/L (ref 98–111)
Creatinine, Ser: 1.1 mg/dL (ref 0.61–1.24)
GFR, Estimated: >60 mL/min (ref >60)
Glucose, Bld: 138 mg/dL — ABNORMAL HIGH (ref 70–99)
Potassium: 3.8 mmol/L (ref 3.5–5.1)
Sodium: 137 mmol/L (ref 135–145)
Total Bilirubin: 0.9 mg/dL (ref 0.0–1.2)
Total Protein: 6.2 g/dL — ABNORMAL LOW (ref 6.5–8.1)

## 2024-04-26 LAB — RESP PANEL BY RT-PCR (RSV, FLU A&B, COVID)  RVPGX2
Influenza A by PCR: NEGATIVE
Influenza B by PCR: NEGATIVE
Resp Syncytial Virus by PCR: NEGATIVE
SARS Coronavirus 2 by RT PCR: NEGATIVE

## 2024-04-26 LAB — PROTIME-INR
INR: 1.1 (ref 0.8–1.2)
Prothrombin Time: 15.1 s (ref 11.4–15.2)

## 2024-04-26 LAB — TSH: TSH: 1.797 u[IU]/mL (ref 0.350–4.500)

## 2024-04-26 LAB — LACTIC ACID, PLASMA: Lactic Acid, Venous: 1.7 mmol/L (ref 0.5–1.9)

## 2024-04-26 LAB — CBG MONITORING, ED: Glucose-Capillary: 119 mg/dL — ABNORMAL HIGH (ref 70–99)

## 2024-04-26 MED ORDER — OXYCODONE HCL 5 MG PO TABS
5.0000 mg | ORAL_TABLET | Freq: Three times a day (TID) | ORAL | Status: DC | PRN
  Administered 2024-04-27 – 2024-04-28 (×2): 5 mg via ORAL
  Filled 2024-04-26 (×4): qty 1

## 2024-04-26 MED ORDER — SENNA 8.6 MG PO TABS: 1.0000 | ORAL_TABLET | Freq: Every day | ORAL | Status: DC | PRN

## 2024-04-26 MED ORDER — ERTAPENEM IV (FOR PTA / DISCHARGE USE ONLY): 1.0000 g | INTRAVENOUS | Status: DC

## 2024-04-26 MED ORDER — INSULIN ASPART 100 UNIT/ML IJ SOLN
0.0000 [IU] | Freq: Three times a day (TID) | INTRAMUSCULAR | Status: DC
  Administered 2024-04-27 (×2): 2 [IU] via SUBCUTANEOUS
  Filled 2024-04-26 (×2): qty 1

## 2024-04-26 MED ORDER — SODIUM CHLORIDE 0.9 % IV SOLN
2.0000 g | Freq: Once | INTRAVENOUS | Status: AC
  Administered 2024-04-26: 2 g via INTRAVENOUS
  Filled 2024-04-26: qty 12.5

## 2024-04-26 MED ORDER — ALUM & MAG HYDROXIDE-SIMETH 200-200-20 MG/5ML PO SUSP
15.0000 mL | Freq: Four times a day (QID) | ORAL | Status: DC | PRN
  Administered 2024-04-26 – 2024-04-27 (×2): 15 mL via ORAL
  Filled 2024-04-26 (×2): qty 30

## 2024-04-26 MED ORDER — LACTATED RINGERS IV BOLUS (SEPSIS)
1000.0000 mL | Freq: Once | INTRAVENOUS | Status: AC
  Administered 2024-04-26: 1000 mL via INTRAVENOUS

## 2024-04-26 MED ORDER — FUROSEMIDE 40 MG PO TABS: 40.0000 mg | ORAL_TABLET | Freq: Every day | ORAL | Status: DC | PRN

## 2024-04-26 MED ORDER — INSULIN ASPART 100 UNIT/ML IJ SOLN: 0.0000 [IU] | Freq: Every day | INTRAMUSCULAR | Status: DC

## 2024-04-26 MED ORDER — ONDANSETRON HCL 4 MG PO TABS
4.0000 mg | ORAL_TABLET | Freq: Four times a day (QID) | ORAL | Status: DC | PRN
Start: 2024-04-26 — End: 2024-04-28

## 2024-04-26 MED ORDER — ACETAMINOPHEN 650 MG RE SUPP: 650.0000 mg | Freq: Four times a day (QID) | RECTAL | Status: DC | PRN

## 2024-04-26 MED ORDER — SODIUM CHLORIDE 0.9 % IV SOLN
1.0000 g | INTRAVENOUS | Status: DC
  Administered 2024-04-27 – 2024-04-28 (×2): 1 g via INTRAVENOUS
  Filled 2024-04-26 (×2): qty 1000

## 2024-04-26 MED ORDER — ACETAMINOPHEN 325 MG PO TABS
650.0000 mg | ORAL_TABLET | Freq: Four times a day (QID) | ORAL | Status: DC | PRN
  Administered 2024-04-27 (×2): 650 mg via ORAL
  Filled 2024-04-26 (×3): qty 2

## 2024-04-26 MED ORDER — LACTATED RINGERS IV BOLUS (SEPSIS): 1000.0000 mL | Freq: Once | INTRAVENOUS | Status: DC

## 2024-04-26 MED ORDER — VANCOMYCIN HCL IN DEXTROSE 1-5 GM/200ML-% IV SOLN: 1000.0000 mg | Freq: Once | INTRAVENOUS | Status: DC

## 2024-04-26 MED ORDER — ONDANSETRON HCL 4 MG/2ML IJ SOLN
4.0000 mg | Freq: Four times a day (QID) | INTRAMUSCULAR | Status: DC | PRN
  Filled 2024-04-26: qty 2

## 2024-04-26 MED ORDER — VANCOMYCIN HCL 1750 MG/350ML IV SOLN
1750.0000 mg | Freq: Once | INTRAVENOUS | Status: AC
  Administered 2024-04-26: 1750 mg via INTRAVENOUS
  Filled 2024-04-26: qty 350

## 2024-04-26 MED ORDER — VANCOMYCIN HCL 1500 MG/300ML IV SOLN
1500.0000 mg | INTRAVENOUS | Status: DC
  Filled 2024-04-26: qty 300

## 2024-04-26 MED ORDER — ENOXAPARIN SODIUM 40 MG/0.4ML IJ SOSY
40.0000 mg | PREFILLED_SYRINGE | INTRAMUSCULAR | Status: DC
  Administered 2024-04-26 – 2024-04-27 (×2): 40 mg via SUBCUTANEOUS
  Filled 2024-04-26 (×2): qty 0.4

## 2024-04-26 MED ORDER — METRONIDAZOLE 500 MG/100ML IV SOLN
500.0000 mg | Freq: Once | INTRAVENOUS | Status: AC
  Administered 2024-04-26: 500 mg via INTRAVENOUS
  Filled 2024-04-26: qty 100

## 2024-04-26 MED ORDER — ASPIRIN 81 MG PO TBEC
81.0000 mg | DELAYED_RELEASE_TABLET | Freq: Every day | ORAL | Status: DC
  Administered 2024-04-26 – 2024-04-28 (×3): 81 mg via ORAL
  Filled 2024-04-26 (×3): qty 1

## 2024-04-26 NOTE — Progress Notes (Signed)
 I called and spoke with patient's wife.  We discussed patient's recent hospitalization for sepsis.  She verbalized frustration with patient's previous care while he was hospitalized.  She says that she has decided to go a different direction regarding his future care.  She initially would not elucidate but then told me that she plans to seek care at a different facility.  She declined offer for our clinic to facilitate any referrals.  She also declined further follow-up at [facility] at this time. I encouraged her to reach out to us  in the future if needed.  Case discussed with Dr. Rennie

## 2024-04-26 NOTE — ED Triage Notes (Signed)
 Pt was discharged Monday from [facility] with a midline and 24 hours of abx. Pt was admitted for pneumonia with sepsis. Pt spiked a fever today and home health told him to come to ER. EMS gave 1g tylenol  and LR en route. Pt reports he just doesn't feel good.   101.6 axillary 115 122/63

## 2024-04-26 NOTE — Consult Note (Signed)
 CODE SEPSIS - PHARMACY COMMUNICATION  **Broad-spectrum antimicrobials should be administered within one hour of sepsis diagnosis**  Time Code Sepsis call or page was received: 1607  Antibiotics ordered: Cefepime , Vancomycin , Metronidazole   Time of first antibiotic administration: 1646  Additional action taken by pharmacy: N/A  If necessary, name of provider/nurse contacted: N/A   Will M. Lenon, PharmD Clinical Pharmacist 04/26/2024 4:14 PM

## 2024-04-26 NOTE — ED Provider Notes (Signed)
 [facility] Provider Note    Event Date/Time   First MD Initiated Contact with Patient 04/26/24 1557     (approximate)   History   Fever   HPI  Casey Peters is a 78 y.o. male  dilated cardiomyopathy, CAD RCA stent in 2023, HTN, HLD, IIDM, BPH with history of recurrent UTIs on suppressive antibiotics, recently hospitalized with painless jaundice, s/p ERCP with biliary stent placement in 03/15/24, exchanged yesterday 04/17/24, with brushings sent to cytology who is being admitted for sepsis secondary to multifocal pneumonia.  I reviewed the hospital discharge note where patient was discharged on ertapenem  1 g daily for 6 days due to E. coli bacteremia and biliary obstruction.  With a plan to pull the midline on 05/02/2024. Patient comes in with today with a fever of 101.6.  Patient got Tylenol  on row and 500 mL of LR.  Family noted the fever today.  He did get his ertapenem  at 10 AM.  He denies any cough, shortness of breath, abdominal pain.  Denies any falls or hitting his head or any other concerns.  I reviewed the ER note where when he came in he had CT scan of chest abdomen pelvis that showed multifocal pneumonia.  Physical Exam   Triage Vital Signs: ED Triage Vitals  Encounter Vitals Group     BP 04/26/24 1601 118/68     Girls Systolic BP Percentile --      Girls Diastolic BP Percentile --      Boys Systolic BP Percentile --      Boys Diastolic BP Percentile --      Pulse Rate 04/26/24 1601 (!) 101     Resp 04/26/24 1601 (!) 24     Temp 04/26/24 1557 99.9 F (37.7 C)     Temp Source 04/26/24 1557 Oral     SpO2 04/26/24 1601 94 %     Weight 04/26/24 1558 179 lb 9.6 oz (81.5 kg)     Height 04/26/24 1558 5' 11 (1.803 m)     Head Circumference --      Peak Flow --      Pain Score 04/26/24 1559 0     Pain Loc --      Pain Education --      Exclude from Growth Chart --     Most recent vital signs: Vitals:   04/26/24 1557 04/26/24 1601  BP:   118/68  Pulse:  (!) 101  Resp:  (!) 24  Temp: 99.9 F (37.7 C)   SpO2:  94%     General: Awake, no distress.  CV:  Good peripheral perfusion.  Resp:  Normal effort.  Abd:  No distention.  Soft nontender with drain in place with drainage coming out of it Other:  No wounds noted to the hands or feet. GU area examined without any evidence of redness or discoloration of the testicles Midline on the left arm without any redness Alert and oriented x 3 No hematoma to the head   ED Results / Procedures / Treatments   Labs (all labs ordered are listed, but only abnormal results are displayed) Labs Reviewed  CULTURE, BLOOD (ROUTINE X 2)  CULTURE, BLOOD (ROUTINE X 2)  COMPREHENSIVE METABOLIC PANEL WITH GFR  LACTIC ACID, PLASMA  LACTIC ACID, PLASMA  CBC WITH DIFFERENTIAL/PLATELET  PROTIME-INR  URINALYSIS, W/ REFLEX TO CULTURE (INFECTION SUSPECTED)     EKG  My interpretation of EKG:  EKG is sinus rate of 98 without  any ST elevation or T wave inversions, normal intervals  RADIOLOGY I have reviewed the xray personally and interpreted possible edema   PROCEDURES:  Critical Care performed: Yes, see critical care procedure note(s)  .1-3 Lead EKG Interpretation  Performed by: Ernest Ronal BRAVO, MD Authorized by: Ernest Ronal BRAVO, MD     Interpretation: normal     ECG rate:  90   ECG rate assessment: normal     Rhythm: sinus rhythm     Ectopy: none     Conduction: normal   .Critical Care  Performed by: Ernest Ronal BRAVO, MD Authorized by: Ernest Ronal BRAVO, MD   Critical care provider statement:    Critical care time (minutes):  30   Critical care was necessary to treat or prevent imminent or life-threatening deterioration of the following conditions:  Sepsis   Critical care was time spent personally by me on the following activities:  Development of treatment plan with patient or surrogate, discussions with consultants, evaluation of patient's response to treatment, examination of  patient, ordering and review of laboratory studies, ordering and review of radiographic studies, ordering and performing treatments and interventions, pulse oximetry, re-evaluation of patient's condition and review of old charts    MEDICATIONS ORDERED IN ED: Medications  metroNIDAZOLE  (FLAGYL ) IVPB 500 mg (500 mg Intravenous New Bag/Given 04/26/24 1822)  vancomycin  (VANCOREADY) IVPB 1750 mg/350 mL (has no administration in time range)  ceFEPIme  (MAXIPIME ) 2 g in sodium chloride  0.9 % 100 mL IVPB (0 g Intravenous Stopped 04/26/24 1748)  lactated ringers  bolus 1,000 mL (1,000 mLs Intravenous New Bag/Given 04/26/24 1650)     IMPRESSION / MDM / ASSESSMENT AND PLAN / ED COURSE  I reviewed the triage vital signs and the nursing notes.   Patient's presentation is most consistent with acute presentation with potential threat to life or bodily function.  Patient comes in with sepsis.  History of positive blood cultures.  Unclear the source.  He really denies any symptoms.  Will start off with blood work if LFTs are elevated may need imaging of his gallbladder/liver.  Patient will start off with 1 L of fluid given his history of CHF and x-ray later showed some concerns for possible pulmonary edema his lactate is normal blood pressures are stable I do not want to give additional fluid given he is got a total of 1.5 L.  Broad-spectrum antibiotics were started although patient already did get ertapenem  this morning.   5:47 PM patient much more alert now.  Repeat abdominal exam remains soft and nontender.  Considered imaging of his abdomen but he really has no abdominal tenderness and had a CT scan just a few days ago that was negative so seems less likely to be an acute abdominal pathology.   Urine no evidence of UTI CMP reassuring CBC elevated white count  Discussed the hospitalist for admission   The patient is on the cardiac monitor to evaluate for evidence of arrhythmia and/or significant heart  rate changes.      FINAL CLINICAL IMPRESSION(S) / ED DIAGNOSES   Final diagnoses:  Sepsis, due to unspecified organism, unspecified whether acute organ dysfunction present [facility])     Rx / DC Orders   ED Discharge Orders     None        Note:  This document was prepared using Dragon voice recognition software and may include unintentional dictation errors.   Ernest Ronal BRAVO, MD 04/26/24 [phone]

## 2024-04-26 NOTE — ED Notes (Signed)
 Blood cultures drawn prior to antibiotic admin. Primary nurse involved in care in another rm.

## 2024-04-26 NOTE — H&P (Signed)
 History and Physical    Patient: Casey Peters:[id number] DOB: [birth year] DOA: 04/26/2024 DOS: the patient was seen and examined on 04/26/2024 PCP: Rudolpho Norleen BIRCH, MD  Patient coming from: Home  Chief Complaint:  Chief Complaint  Patient presents with   Fever   HPI: DENSEL Peters is a 78 y.o. male with medical history significant for dilated cardiomyopathy, CAD RCA stent in 2023, HTN, HLD, T2DM, BPH with history of recurrent UTIs, Biliary stenosis/obstruction  status post ERCP/ stent placement  complicated by E. coli bacteremia with percutaneous biliary drain in place for which he was hospitalized from 04/18/2024 to 04/24/2024 And discharged on IV ertapenem  Presenting to the emergency department for evaluation of fever and feeling unwell today.  Says he felt okay yesterday. Patient was unable to further describe specific symptoms as to why he does not feel well .  He did admit to having intermittent burning with urination, denies back pain.  Otherwise he denied abdominal pain, nausea, vomiting, diarrhea, chest pain, cough, shortness of breath, dizziness, neck pain, difficulty with drain Temperature at home reportedly 101.6, he was given Tylenol  and 500 cc bolus en route via EMS per ED triage note.  Upon arrival into the emergency department, he was hemodynamically stable and afebrile, however his white count was up to 17, which is up from 8 the time of discharge 2 days ago. Alk phos elevated 177, was 106 previously.  Abdominal exam benign. UA negative for UTI.  COVID flu and RSV were negative.  Chest x-ray was also unremarkable. Blood cultures were drawn and he was given a dose of cefepime , Vanco and, metronidazole ,   and hospitalist was consulted for admission   Wife is not present at the time my exam, however per ED provider she and the patient are agreeable to admission.  Patient expressed desire to to be full code.   Review of Systems: Review of Systems   Constitutional:  Positive for fever and malaise/fatigue. Negative for chills and weight loss.  Respiratory:  Negative for cough and shortness of breath.   Cardiovascular:  Negative for chest pain, palpitations and leg swelling.  Gastrointestinal:  Negative for abdominal pain, constipation, diarrhea, nausea and vomiting.  Genitourinary:  Positive for dysuria. Negative for frequency and urgency.  Neurological:  Positive for tremors. Negative for dizziness, focal weakness and headaches.    Past Medical History:  Diagnosis Date   Cardiomegaly    CHF (congestive heart failure) (HCC)    Constipation    Diabetic polyneuropathy (HCC)    Dilated idiopathic cardiomyopathy (HCC)    Erectile dysfunction    HLD (hyperlipidemia)    HTN (hypertension)    Low testosterone     Palpitations    Pneumonia    T2DM (type 2 diabetes mellitus) (HCC)    Past Surgical History:  Procedure Laterality Date   CORONARY/GRAFT ACUTE MI REVASCULARIZATION N/A 12/27/2021   Procedure: Coronary/Graft Acute MI Revascularization;  Surgeon: Mady Bruckner, MD;  Location: [facility] INVASIVE CV LAB;  Service: Cardiovascular;  Laterality: N/A;   CYSTOSCOPY WITH URETHRAL DILATATION N/A 06/17/2021   Procedure: CYSTOSCOPY WITH URETHRAL DILATATION;  Surgeon: Twylla Glendia BROCKS, MD;  Location: [facility] ORS;  Service: Urology;  Laterality: N/A;  Optilume Balloon   ERCP N/A 03/15/2024   Procedure: ERCP, WITH INTERVENTION IF INDICATED;  Surgeon: Jinny Carmine, MD;  Location: [facility] ENDOSCOPY;  Service: Endoscopy;  Laterality: N/A;   ERCP N/A 04/17/2024   Procedure: ERCP, WITH INTERVENTION IF INDICATED;  Surgeon: Jinny Carmine, MD;  Location: [facility]  ENDOSCOPY;  Service: Endoscopy;  Laterality: N/A;   HERNIA REPAIR  1998   IR BILIARY DRAIN PLACEMENT WITH CHOLANGIOGRAM  03/16/2024   LEFT HEART CATH AND CORONARY ANGIOGRAPHY N/A 12/27/2021   Procedure: LEFT HEART CATH AND CORONARY ANGIOGRAPHY;  Surgeon: Mady Bruckner, MD;  Location: [facility] INVASIVE CV LAB;   Service: Cardiovascular;  Laterality: N/A;   PILONIDAL CYST EXCISION     TONSILLECTOMY     URETHRA SURGERY  1995   Social History:  reports that he has been smoking cigars. He has never used smokeless tobacco. He reports that he does not drink alcohol and does not use drugs.  Allergies  Allergen Reactions   Citalopram Nausea And Vomiting    Dizziness, unsteady, balanced issues   Sulfamethoxazole -Trimethoprim      Fatigue and muscle ache Other reaction(s): Other (See Comments) Fatigue and muscle ache   Trimethoprim  Other (See Comments)    Fatigue, hypotension    Family History  Problem Relation Age of Onset   Hematuria Father    Kidney disease Neg Hx    Prostate cancer Neg Hx     Prior to Admission medications   Medication Sig Start Date End Date Taking? Authorizing Provider  alum & mag hydroxide-simeth (MAALOX/MYLANTA) 200-200-20 MG/5ML suspension Take 15 mLs by mouth every 6 (six) hours as needed for indigestion or heartburn. 04/24/24   Von Bellis, MD  aspirin  EC 81 MG tablet Take 81 mg by mouth daily.    [provider]  carvedilol  (COREG ) 6.25 MG tablet Take 6.25 mg by mouth 2 (two) times daily with a meal.    [provider]  ertapenem  (INVANZ ) IVPB Inject 1 g into the vein daily for 6 days. Indication:  E coli bacteremia and biliary obstruction First Dose: Yes Last Day of Therapy:  05/01/2024 Labs - Once weekly:  CBC/D and CMP Fax weekly lab results  promptly to [phone] Method of administration: Mini-Bag Plus / Gravity Method of administration may be changed at the discretion of home infusion pharmacist based upon assessment  of the patient and/or caregiver's ability to self-administer the medication ordered. Please pull midline at completion of IV antibiotics on 05/02/24 Call [phone] with any critical values or questions 04/25/24 05/01/24  Von Bellis, MD  furosemide  (LASIX ) 40 MG tablet Take 40 mg by mouth daily. Patient not taking:  Reported on 04/18/2024    [provider]  glimepiride (AMARYL) 2 MG tablet Take 2 mg by mouth. 03/21/24 03/21/25  [provider]  JANUVIA 50 MG tablet Take 1 tablet by mouth daily. 02/29/24 02/28/25  [provider]  ondansetron  (ZOFRAN ) 8 MG tablet One pill every 8 hours as needed for nausea/vomitting. 03/28/24   Brahmanday, Govinda R, MD  oxyCODONE  (OXY IR/ROXICODONE ) 5 MG immediate release tablet Take 1 tablet (5 mg total) by mouth every 8 (eight) hours as needed for severe pain (pain score 7-10). 04/05/24   Borders, Fonda SAUNDERS, NP  pantoprazole  (PROTONIX ) 40 MG tablet Take 1 tablet (40 mg total) by mouth 2 (two) times daily for 30 days, THEN 1 tablet (40 mg total) daily. 04/24/24 06/23/24  Von Bellis, MD  senna (SENOKOT) 8.6 MG TABS tablet Take 1 tablet (8.6 mg total) by mouth daily as needed for mild constipation. 04/05/24   Borders, Fonda SAUNDERS, NP  silodosin  (RAPAFLO ) 8 MG CAPS capsule Take 1 capsule (8 mg total) by mouth daily with breakfast. 12/10/23   Vaillancourt, Lucie, PA-C  spironolactone  (ALDACTONE ) 25 MG tablet Take 12.5 mg by mouth daily.  01/04/24   [provider]  testosterone  (ANDROGEL ) 50 MG/5GM (1%) GEL Place 5 g onto the skin daily. Please confirm with your cardiologist before resuming 12/29/21   Caleen Qualia, MD  Vitamin D , Ergocalciferol , (DRISDOL ) 1.25 MG (50000 UNIT) CAPS capsule Take 1 capsule (50,000 Units total) by mouth every 7 (seven) days. 04/26/24 07/25/24  Von Bellis, MD    Physical Exam: Vitals:   04/26/24 1601 04/26/24 1800 04/26/24 1832 04/26/24 1930  BP: 118/68 130/65  117/68  Pulse: (!) 101 91  89  Resp: (!) 24   20  Temp:   99.8 F (37.7 C) 98.4 F (36.9 C)  TempSrc:   Oral Oral  SpO2: 94% 95%  95%  Weight:      Height:       Physical Exam Vitals and nursing note reviewed.  Constitutional:      General: He is not in acute distress.    Appearance: He is ill-appearing.  HENT:     Head: Normocephalic and atraumatic.   Cardiovascular:     Rate and Rhythm: Normal rate and regular rhythm.  Pulmonary:     Effort: Pulmonary effort is normal. No respiratory distress.     Breath sounds: Normal breath sounds. No wheezing.  Abdominal:     General: Bowel sounds are normal. There is no distension.     Palpations: Abdomen is soft.     Tenderness: There is no abdominal tenderness. There is no right CVA tenderness, left CVA tenderness or guarding.     Comments: Drain in RUQ, dressing CDI  Musculoskeletal:     Cervical back: Neck supple.     Right lower leg: No edema.     Left lower leg: No edema.  Skin:    General: Skin is warm.  Neurological:     Mental Status: He is alert and oriented to person, place, and time.  Psychiatric:        Mood and Affect: Affect is blunt.        Speech: Speech normal.     Comments: Somewhat uncooperative with questioning      Data Reviewed:    Labs on Admission: I have personally reviewed following labs and imaging studies  CBC: Recent Labs  Lab 04/21/24 0454 04/22/24 0316 04/23/24 0249 04/24/24 0707 04/26/24 1602  WBC 6.4 6.7 7.1 8.2 17.4*  NEUTROABS  --   --   --   --  14.7*  HGB 11.6* 12.5* 11.3* 12.1* 11.8*  HCT 33.8* 36.3* 33.6* 34.8* 34.6*  MCV 91.4 92.8 93.3 92.1 94.3  PLT 142* 161 168 204 324   Basic Metabolic Panel: Recent Labs  Lab 04/20/24 0423 04/21/24 0454 04/22/24 0316 04/23/24 0249 04/24/24 0707 04/26/24 1602  NA 137 137 139 137 138 137  K 3.2* 3.4* 4.0 3.6 3.8 3.8  CL 104 101 101 105 102 101  CO2 24 26 28 27 26 25   GLUCOSE 121* 142* 157* 117* 121* 138*  BUN 21 17 20 17 16 16   CREATININE 1.28* 1.10 1.18 1.11 0.95 1.10  CALCIUM  7.9* 7.8* 8.2* 7.6* 8.1* 8.0*  MG 1.6* 1.9 1.9 1.9 1.8  --   PHOS 2.4* 2.1* 3.0 2.1* 2.4*  --    GFR: Estimated Creatinine Clearance: 60.8 mL/min (by C-G formula based on SCr of 1.1 mg/dL). Liver Function Tests: Recent Labs  Lab 04/21/24 0454 04/22/24 0316 04/23/24 0249 04/24/24 0707 04/26/24 1602   AST 25 43* 25 23 33  ALT 35 44 33 31 38  ALKPHOS 76 94 81 106 177*  BILITOT 1.2 1.6* 1.1 1.2 0.9  PROT 5.8* 5.7* 5.7* 5.6* 6.2*  ALBUMIN 2.3* 2.3* 2.2* 2.3* 2.5*   Recent Labs  Lab 04/21/24 0454 04/23/24 0249 04/24/24 0707  LIPASE 60* 63* 78*  AMYLASE 45  --   --    No results for input(s): AMMONIA in the last 168 hours. Coagulation Profile: Recent Labs  Lab 04/26/24 1602  INR 1.1   Cardiac Enzymes: No results for input(s): CKTOTAL, CKMB, CKMBINDEX, TROPONINI in the last 168 hours. BNP (last 3 results) No results for input(s): PROBNP in the last 8760 hours. HbA1C: No results for input(s): HGBA1C in the last 72 hours. CBG: Recent Labs  Lab 04/23/24 1733 04/23/24 2124 04/24/24 0755 04/24/24 1236 04/24/24 1639  GLUCAP 133* 137* 121* 126* 115*   Lipid Profile: No results for input(s): CHOL, HDL, LDLCALC, TRIG, CHOLHDL, LDLDIRECT in the last 72 hours. Thyroid Function Tests: No results for input(s): TSH, T4TOTAL, FREET4, T3FREE, THYROIDAB in the last 72 hours. Anemia Panel: No results for input(s): VITAMINB12, FOLATE, FERRITIN, TIBC, IRON, RETICCTPCT in the last 72 hours. Urine analysis:    Component Value Date/Time   COLORURINE YELLOW (A) 04/26/2024 1818   APPEARANCEUR CLEAR (A) 04/26/2024 1818   APPEARANCEUR Clear 11/08/2023 1116   LABSPEC 1.020 04/26/2024 1818   PHURINE 5.0 04/26/2024 1818   GLUCOSEU NEGATIVE 04/26/2024 1818   HGBUR SMALL (A) 04/26/2024 1818   BILIRUBINUR NEGATIVE 04/26/2024 1818   BILIRUBINUR Negative 11/08/2023 1116   KETONESUR NEGATIVE 04/26/2024 1818   PROTEINUR NEGATIVE 04/26/2024 1818   UROBILINOGEN 0.2 09/17/2023 1505   NITRITE NEGATIVE 04/26/2024 1818   LEUKOCYTESUR NEGATIVE 04/26/2024 1818    Radiological Exams on Admission: DG Chest Portable 1 View Result Date: 04/26/2024 CLINICAL DATA:  Fever. EXAM: PORTABLE CHEST 1 VIEW COMPARISON:  12/27/2021. FINDINGS: Low lung volume. Mild  diffuse pulmonary vascular congestion, overall less in intensity when compared to the prior exam from 12/27/2021. For there are atelectatic changes at the left lung base. Bilateral lung fields are otherwise clear. No acute consolidation or lung collapse. Bilateral costophrenic angles are clear. Stable cardio-mediastinal silhouette. No acute osseous abnormalities. The soft tissues are within normal limits. IMPRESSION: *Mild diffuse pulmonary vascular congestion, overall less in intensity when compared to the prior exam from 12/27/2021. No acute consolidation or lung collapse. Electronically Signed   By: Ree Molt M.D.   On: 04/26/2024 16:33       Assessment and Plan: No notes have been filed under this hospital service. Service: Hospitalist   78 y.o. male with medical history significant for dilated cardiomyopathy, CAD RCA stent in 2023, HTN, HLD, T2DM, BPH with history of recurrent UTIs, Biliary stenosis/obstruction status post stent placement complicated by E. coli bacteremia for which he was hospitalized from 04/18/2024 to 04/24/2024, currently has with percutaneous biliary drain in place, exchange of which was deferred last hospitalization discharged on ertapenem  came back to the hospital tonight for evaluation of fever. Hemodynamically stable but with new leukocytosis.  Source unclear.  Sepsis with unknown source - With fever (resolved prior to admission after Tylenol  gave EMS) and leukocytosis, recent hospitalization for E. coli bacteremia with midline and percutaneous biliary  drain  in place currently.  -Consider exchange of biliary drain exchange which was deferred last hospitalization, as well as midline exchanged with culture tip  - Last dose of IV ertapenem  this morning.  Chest x-ray and UA unremarkable.  Abdominal exam benign.  Patient does not endorse any localizing  symptoms.  - Continue IV ertapenem  and vanco pending ID consult - Follow blood cultures, full viral respiratory panel  ordered  - Hold additional fluid given vascular congestion seen on chest x-ray  T2DM  - SSI   Dilated cardiomyopathy  CAD s/p stent - stable. Cont asa, lasix .  - bb and spironolactone  on hold previous admission   Lovenox  Consistent carb diet Monitor/replace electrolytes No IVF  Advance Care Planning:   Code Status: Full Code discussed with patient at time of admission    Severity of Illness: The appropriate patient status for this patient is INPATIENT. Inpatient status is judged to be reasonable and necessary in order to provide the required intensity of service to ensure the patient's safety. The patient's presenting symptoms, physical exam findings, and initial radiographic and laboratory data in the context of their chronic comorbidities is felt to place them at high risk for further clinical deterioration. Furthermore, it is not anticipated that the patient will be medically stable for discharge from the hospital within 2 midnights of admission.   * I certify that at the point of admission it is my clinical judgment that the patient will require inpatient hospital care spanning beyond 2 midnights from the point of admission due to high intensity of service, high risk for further deterioration and high frequency of surveillance required.*  Author: Daved JAYSON Pump, DO 04/26/2024 9:49 PM  For on call review [email or web address].

## 2024-04-27 ENCOUNTER — Inpatient Hospital Stay

## 2024-04-27 ENCOUNTER — Encounter: Payer: Self-pay | Admitting: Emergency Medicine

## 2024-04-27 DIAGNOSIS — I517 Cardiomegaly: Secondary | ICD-10-CM

## 2024-04-27 DIAGNOSIS — K831 Obstruction of bile duct: Secondary | ICD-10-CM

## 2024-04-27 DIAGNOSIS — A4151 Sepsis due to Escherichia coli [E. coli]: Secondary | ICD-10-CM | POA: Diagnosis not present

## 2024-04-27 DIAGNOSIS — I2510 Atherosclerotic heart disease of native coronary artery without angina pectoris: Secondary | ICD-10-CM

## 2024-04-27 DIAGNOSIS — Z9861 Coronary angioplasty status: Secondary | ICD-10-CM

## 2024-04-27 DIAGNOSIS — A419 Sepsis, unspecified organism: Secondary | ICD-10-CM

## 2024-04-27 DIAGNOSIS — E119 Type 2 diabetes mellitus without complications: Secondary | ICD-10-CM

## 2024-04-27 DIAGNOSIS — R509 Fever, unspecified: Secondary | ICD-10-CM | POA: Diagnosis not present

## 2024-04-27 DIAGNOSIS — D72829 Elevated white blood cell count, unspecified: Secondary | ICD-10-CM

## 2024-04-27 LAB — COMPREHENSIVE METABOLIC PANEL WITH GFR
ALT: 34 U/L (ref 0–44)
AST: 21 U/L (ref 15–41)
Albumin: 2.3 g/dL — ABNORMAL LOW (ref 3.5–5.0)
Alkaline Phosphatase: 145 U/L — ABNORMAL HIGH (ref 38–126)
Anion gap: 9 (ref 5–15)
BUN: 14 mg/dL (ref 8–23)
CO2: 25 mmol/L (ref 22–32)
Calcium: 7.8 mg/dL — ABNORMAL LOW (ref 8.9–10.3)
Chloride: 101 mmol/L (ref 98–111)
Creatinine, Ser: 0.9 mg/dL (ref 0.61–1.24)
GFR, Estimated: >60 mL/min (ref >60)
Glucose, Bld: 116 mg/dL — ABNORMAL HIGH (ref 70–99)
Potassium: 3.3 mmol/L — ABNORMAL LOW (ref 3.5–5.1)
Sodium: 135 mmol/L (ref 135–145)
Total Bilirubin: 1.6 mg/dL — ABNORMAL HIGH (ref 0.0–1.2)
Total Protein: 5.6 g/dL — ABNORMAL LOW (ref 6.5–8.1)

## 2024-04-27 LAB — RESPIRATORY PANEL BY PCR

## 2024-04-27 LAB — CBC
HCT: 32.9 % — ABNORMAL LOW (ref 39.0–52.0)
Hemoglobin: 11 g/dL — ABNORMAL LOW (ref 13.0–17.0)
MCH: 31.3 pg (ref 26.0–34.0)
MCHC: 33.4 g/dL (ref 30.0–36.0)
MCV: 93.7 fL (ref 80.0–100.0)
Platelets: 317 K/uL (ref 150–400)
RBC: 3.51 MIL/uL — ABNORMAL LOW (ref 4.22–5.81)
RDW: 15.5 % (ref 11.5–15.5)
WBC: 13.2 K/uL — ABNORMAL HIGH (ref 4.0–10.5)
nRBC: 0 % (ref 0.0–0.2)

## 2024-04-27 LAB — MRSA NEXT GEN BY PCR, NASAL: MRSA by PCR Next Gen: NOT DETECTED

## 2024-04-27 LAB — PROTIME-INR
INR: 1.2 (ref 0.8–1.2)
Prothrombin Time: 16.1 s — ABNORMAL HIGH (ref 11.4–15.2)

## 2024-04-27 LAB — GLUCOSE, CAPILLARY
Glucose-Capillary: 114 mg/dL — ABNORMAL HIGH (ref 70–99)
Glucose-Capillary: 114 mg/dL — ABNORMAL HIGH (ref 70–99)

## 2024-04-27 LAB — CBG MONITORING, ED
Glucose-Capillary: 127 mg/dL — ABNORMAL HIGH (ref 70–99)
Glucose-Capillary: 128 mg/dL — ABNORMAL HIGH (ref 70–99)

## 2024-04-27 LAB — MAGNESIUM: Magnesium: 1.9 mg/dL (ref 1.7–2.4)

## 2024-04-27 MED ORDER — IOHEXOL 300 MG/ML  SOLN
100.0000 mL | Freq: Once | INTRAMUSCULAR | Status: AC | PRN
  Administered 2024-04-27: 100 mL via INTRAVENOUS

## 2024-04-27 MED ORDER — VANCOMYCIN HCL IN DEXTROSE 1-5 GM/200ML-% IV SOLN
1000.0000 mg | Freq: Two times a day (BID) | INTRAVENOUS | Status: DC
  Administered 2024-04-27 (×2): 1000 mg via INTRAVENOUS
  Filled 2024-04-27 (×3): qty 200

## 2024-04-27 MED ORDER — PANTOPRAZOLE SODIUM 40 MG IV SOLR
40.0000 mg | Freq: Once | INTRAVENOUS | Status: AC
  Administered 2024-04-27: 40 mg via INTRAVENOUS
  Filled 2024-04-27: qty 10

## 2024-04-27 MED ORDER — SODIUM CHLORIDE 0.9 % IV SOLN
2.0000 g | INTRAVENOUS | Status: AC
  Filled 2024-04-27: qty 20

## 2024-04-27 MED ORDER — POTASSIUM CHLORIDE CRYS ER 20 MEQ PO TBCR
40.0000 meq | EXTENDED_RELEASE_TABLET | Freq: Once | ORAL | Status: AC
  Administered 2024-04-27: 40 meq via ORAL
  Filled 2024-04-27: qty 2

## 2024-04-27 MED ORDER — SODIUM CHLORIDE 0.9 % IV SOLN: 2.0000 g | INTRAVENOUS | Status: DC

## 2024-04-27 MED ORDER — BISMUTH SUBSALICYLATE 262 MG/15ML PO SUSP: 30.0000 mL | Freq: Once | ORAL | Status: DC

## 2024-04-27 MED ORDER — ALUM & MAG HYDROXIDE-SIMETH 200-200-20 MG/5ML PO SUSP
15.0000 mL | ORAL | Status: DC | PRN
  Filled 2024-04-27: qty 30

## 2024-04-27 NOTE — Plan of Care (Signed)
   Problem: Education: Goal: Knowledge of General Education information will improve Description Including pain rating scale, medication(s)/side effects and non-pharmacologic comfort measures Outcome: Progressing

## 2024-04-27 NOTE — Assessment & Plan Note (Signed)
 Patient with history of chronic HFpEF.  Clinically appears euvolemic. - Continue with Lasix  -Monitor volume status

## 2024-04-27 NOTE — Progress Notes (Signed)
 Progress Note   Patient: Casey Peters FMW:[id number] DOB: May 08, 1946 DOA: 04/26/2024     1 DOS: the patient was seen and examined on 04/27/2024   Brief hospital course: Taken from H&P.  Casey Peters is a 78 y.o. 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 coli bacteremia with percutaneous biliary drain in place for which he was hospitalized from 04/18/2024 to 04/24/2024 and discharged on IV ertapenem , presented to ED with complaints of not feeling well and fever.  Temperature at home reportedly 101.6, he was given Tylenol  and 500 cc bolus en route via EMS per ED triage note.   On presentation hemodynamically stable and afebrile, labs with worsening leukocytosis at 17, alkaline phosphatase elevated at 177, it was 106 previously. UA negative for UTI, respiratory panel is negative.  Chest x-ray unremarkable.  Blood cultures were redrawn and patient was continued on ertapenem .  7/17: Hemodynamically stable, some improvement in alkaline phosphatase and T. bili at 1.6.  Right upper quadrant ultrasound with decompressed gallbladder and a small amount of gas in the lumen, no cholelithiasis or changes of acute cholecystitis.  CT abdomen with contrast with stable positioning of right sided internal/external biliary drainage catheter.  No evidence of biliary obstruction with decompressed right-sided bile ducts and chronic atrophy of left lobe.  The biliary stent has migrated further and is now almost entirely in the duodenum with a short segment remaining in the distal CBD.  IR is going to exchange the percutaneous drain tomorrow.  They were requested to obtain some brushings for biopsy as there is still no definitive diagnosis.  Assessment and Plan: * Sepsis [facility]) Patient initially met sepsis criteria with fever  and leukocytosis.  Recent admission secondary to ESBL E. coli bacteremia for which patient was on ertapenem  till 05/01/2024.  Repeat blood cultures negative in 24-hour. Leukocytosis improving. No obvious infection at this time. -Continue with ertapenem  - Continue with supportive care  Biliary obstruction S/p ERCP with stent placement and percutaneous gallbladder drain placement.  IR was consulted and they are going to do percutaneous drain exchange. They were requested to obtain some brush biopsies during exchange as final diagnosis with concerning PET remains unclear. Elevated alkaline phosphatase and T. bili - Continue with drain care - Monitor CMP  Type 2 diabetes mellitus (HCC) Seems well-controlled with most recent A1c of 6.9. Uses Amaryl and Januvia at home -SSI  CAD S/P percutaneous coronary angioplasty No chest pain. -Continue home aspirin  -Beta-blocker and spironolactone  was held on previous admission  Cardiomegaly Patient with history of chronic HFpEF.  Clinically appears euvolemic. - Continue with Lasix  -Monitor volume status   Subjective: Patient was seen and examined today.  Denies any pain.  Going for drain exchange tomorrow with IR  Physical Exam: Vitals:   04/27/24 1421 04/27/24 1455 04/27/24 1455 04/27/24 1558  BP: 110/63  119/66   Pulse: 73 74 73   Resp: 20 18 16    Temp:   99.1 F (37.3 C) 99.4 F (37.4 C)  TempSrc:   Oral Oral  SpO2: 100% 97% 97%   Weight:      Height:       General.  Frail elderly man, in no acute distress. Pulmonary.  Lungs clear bilaterally, normal respiratory effort. CV.  Regular rate and rhythm, no JVD, rub or murmur. Abdomen.  Soft, nontender, nondistended, BS  positive.  Biliary drain in place. CNS.  Alert and oriented .  No focal neurologic deficit. Extremities.  No edema,  pulses intact and symmetrical. Psychiatry.  Judgment and insight appears normal.   Data Reviewed: Prior data reviewed  Family Communication:  Discussed with wife at bedside  Disposition: Status is: Inpatient Remains inpatient appropriate because: Severity of illness  Planned Discharge Destination: Home and Home with Home Health  DVT prophylaxis.  Lovenox  Time spent: 50 minutes  This record has been created using Conservation officer, historic buildings. Errors have been sought and corrected,but may not always be located. Such creation errors do not reflect on the standard of care.   Author: Amaryllis Dare, MD 04/27/2024 4:29 PM  For on call review [email or web address].

## 2024-04-27 NOTE — Assessment & Plan Note (Addendum)
 S/p ERCP with stent placement and percutaneous gallbladder drain placement.  IR was consulted and they are going to do percutaneous drain exchange. They were requested to obtain some brush biopsies during exchange as final diagnosis with concerning PET remains unclear. Elevated alkaline phosphatase and T. bili - Continue with drain care - Monitor CMP

## 2024-04-27 NOTE — Consult Note (Signed)
 Chief Complaint: Sepsis; biliary drain - image guided biliary drain exchange and brush biopsy   Referring Provider(s): Caleen Qualia  Supervising Physician: Luverne Aran  Patient Status: [facility] - In-pt  History of Present Illness: Casey Peters is a 78 y.o. male with history of cardiomegaly, congestive heart failure, diabetic neuropathy, hypertension, hyperlipidemia, pneumonia, and sepsis.  Pt presented to the emergency department 04/26/24 with complaints of fever after recent discharge.  Pt is known to us  from recent biliary drain placement 03/16/24 with Dr. Philip.  Interventional radiology was consulted for possible biliary drain exchange and brush biopsy 04/20/24 during inpatient stay; the procedure was discussed in detail with pt and his wife who ultimately decided to defer the procedure.  Interventional radiology was consulted today 04/27/24 for biliary drain evaluation due to increased T bili in the ED.     Patient is Full Code  Past Medical History:  Diagnosis Date   Cardiomegaly    CHF (congestive heart failure) (HCC)    Constipation    Diabetic polyneuropathy (HCC)    Dilated idiopathic cardiomyopathy (HCC)    Erectile dysfunction    HLD (hyperlipidemia)    HTN (hypertension)    Low testosterone     Palpitations    Pneumonia    T2DM (type 2 diabetes mellitus) (HCC)     Past Surgical History:  Procedure Laterality Date   CORONARY/GRAFT ACUTE MI REVASCULARIZATION N/A 12/27/2021   Procedure: Coronary/Graft Acute MI Revascularization;  Surgeon: Mady Bruckner, MD;  Location: [facility] INVASIVE CV LAB;  Service: Cardiovascular;  Laterality: N/A;   CYSTOSCOPY WITH URETHRAL DILATATION N/A 06/17/2021   Procedure: CYSTOSCOPY WITH URETHRAL DILATATION;  Surgeon: Twylla Glendia BROCKS, MD;  Location: [facility] ORS;  Service: Urology;  Laterality: N/A;  Optilume Balloon   ERCP N/A 03/15/2024   Procedure: ERCP, WITH INTERVENTION IF INDICATED;  Surgeon: Jinny Carmine, MD;  Location: [facility]  ENDOSCOPY;  Service: Endoscopy;  Laterality: N/A;   ERCP N/A 04/17/2024   Procedure: ERCP, WITH INTERVENTION IF INDICATED;  Surgeon: Jinny Carmine, MD;  Location: [facility] ENDOSCOPY;  Service: Endoscopy;  Laterality: N/A;   HERNIA REPAIR  1998   IR BILIARY DRAIN PLACEMENT WITH CHOLANGIOGRAM  03/16/2024   LEFT HEART CATH AND CORONARY ANGIOGRAPHY N/A 12/27/2021   Procedure: LEFT HEART CATH AND CORONARY ANGIOGRAPHY;  Surgeon: Mady Bruckner, MD;  Location: [facility] INVASIVE CV LAB;  Service: Cardiovascular;  Laterality: N/A;   PILONIDAL CYST EXCISION     TONSILLECTOMY     URETHRA SURGERY  1995    Allergies: Citalopram, Sulfamethoxazole -trimethoprim , and Trimethoprim   Medications: Prior to Admission medications   Medication Sig Start Date End Date Taking? Authorizing Provider  alum & mag hydroxide-simeth (MAALOX/MYLANTA) 200-200-20 MG/5ML suspension Take 15 mLs by mouth every 6 (six) hours as needed for indigestion or heartburn. 04/24/24  Yes Von Bellis, MD  aspirin  EC 81 MG tablet Take 81 mg by mouth daily.   Yes [provider]  ertapenem  (INVANZ ) IVPB Inject 1 g into the vein daily for 6 days. Indication:  E coli bacteremia and biliary obstruction First Dose: Yes Last Day of Therapy:  05/01/2024 Labs - Once weekly:  CBC/D and CMP Fax weekly lab results  promptly to [phone] Method of administration: Mini-Bag Plus / Gravity Method of administration may be changed at the discretion of home infusion pharmacist based upon assessment  of the patient and/or caregiver's ability to self-administer the medication ordered. Please pull midline at completion of IV antibiotics on 05/02/24 Call [phone] with any critical  values or questions 04/25/24 05/01/24 Yes Von Bellis, MD  furosemide  (LASIX ) 40 MG tablet Take 40 mg by mouth daily as needed.   Yes [provider]  glimepiride (AMARYL) 2 MG tablet Take 2 mg by mouth daily as needed. 03/21/24 03/21/25 Yes [provider]   JANUVIA 50 MG tablet Take 1 tablet by mouth daily. 02/29/24 02/28/25 Yes [provider]  ondansetron  (ZOFRAN ) 8 MG tablet One pill every 8 hours as needed for nausea/vomitting. 03/28/24  Yes Rennie Cindy SAUNDERS, MD  oxyCODONE  (OXY IR/ROXICODONE ) 5 MG immediate release tablet Take 1 tablet (5 mg total) by mouth every 8 (eight) hours as needed for severe pain (pain score 7-10). 04/05/24  Yes Borders, Fonda SAUNDERS, NP  pantoprazole  (PROTONIX ) 40 MG tablet Take 1 tablet (40 mg total) by mouth 2 (two) times daily for 30 days, THEN 1 tablet (40 mg total) daily. 04/24/24 06/23/24 Yes Von Bellis, MD  senna (SENOKOT) 8.6 MG TABS tablet Take 1 tablet (8.6 mg total) by mouth daily as needed for mild constipation. 04/05/24  Yes Borders, Fonda SAUNDERS, NP  silodosin  (RAPAFLO ) 8 MG CAPS capsule Take 1 capsule (8 mg total) by mouth daily with breakfast. 12/10/23  Yes Vaillancourt, Samantha, PA-C  testosterone  (ANDROGEL ) 50 MG/5GM (1%) GEL Place 5 g onto the skin daily. Please confirm with your cardiologist before resuming 12/29/21  Yes Caleen Qualia, MD  Vitamin D , Ergocalciferol , (DRISDOL ) 1.25 MG (50000 UNIT) CAPS capsule Take 1 capsule (50,000 Units total) by mouth every 7 (seven) days. 04/26/24 07/25/24 Yes Von Bellis, MD  carvedilol  (COREG ) 6.25 MG tablet Take 6.25 mg by mouth 2 (two) times daily with a meal. Patient not taking: Reported on 04/26/2024    [provider]  spironolactone  (ALDACTONE ) 25 MG tablet Take 12.5 mg by mouth daily. Patient not taking: Reported on 04/26/2024 01/04/24   [provider]     Family History  Problem Relation Age of Onset   Hematuria Father    Kidney disease Neg Hx    Prostate cancer Neg Hx     Social History   Socioeconomic History   Marital status: Married    Spouse name: Not on file   Number of children: Not on file   Years of education: Not on file   Highest education level: Not on file  Occupational History   Not on file  Tobacco Use    Smoking status: Every Day    Types: Cigars   Smokeless tobacco: Never   Tobacco comments:    2-3 cigars per day  Vaping Use   Vaping status: Never Used  Substance and Sexual Activity   Alcohol use: No    Alcohol/week: 0.0 standard drinks of alcohol   Drug use: No   Sexual activity: Yes    Birth control/protection: None  Other Topics Concern   Not on file  Social History Narrative   Not on file   Social Drivers of Health   Financial Resource Strain: Not on file  Food Insecurity: No Food Insecurity (04/19/2024)   Hunger Vital Sign    Worried About Running Out of Food in the Last Year: Never true    Ran Out of Food in the Last Year: Never true  Transportation Needs: No Transportation Needs (04/19/2024)   PRAPARE - Administrator, Civil Service (Medical): No    Lack of Transportation (Non-Medical): No  Physical Activity: Not on file  Stress: Not on file  Social Connections: Socially Integrated (04/19/2024)  Social Advertising account executive    Frequency of Communication with Friends and Family: More than three times a week    Frequency of Social Gatherings with Friends and Family: Twice a week    Attends Religious Services: More than 4 times per year    Active Member of [employer] or Organizations: Yes    Attends Engineer, structural: More than 4 times per year    Marital Status: Married     Review of Systems: A 12 point ROS discussed and pertinent positives are indicated in the HPI above.  All other systems are negative.  Review of Systems  Constitutional:  Positive for fever. Negative for fatigue.  HENT:  Negative for congestion and dental problem.   Respiratory:  Negative for cough, chest tightness, shortness of breath and wheezing.   Cardiovascular:  Negative for chest pain.  Gastrointestinal:  Negative for diarrhea, nausea and vomiting.  Neurological:  Negative for light-headedness and headaches.  Psychiatric/Behavioral:  Negative for agitation,  behavioral problems and confusion.     Vital Signs: BP 108/64   Pulse 77   Temp 98.1 F (36.7 C) (Oral)   Resp (!) 22   Ht 5' 11 (1.803 m)   Wt 179 lb 9.6 oz (81.5 kg)   SpO2 98%   BMI 25.05 kg/m   Advance Care Plan: The advanced care place/surrogate decision maker was discussed at the time of visit and the patient did not wish to discuss or was not able to name a surrogate decision maker or provide an advance care plan.  Physical Exam Constitutional:      Appearance: Normal appearance.  HENT:     Head: Normocephalic and atraumatic.     Mouth/Throat:     Mouth: Mucous membranes are moist.  Cardiovascular:     Rate and Rhythm: Normal rate and regular rhythm.  Pulmonary:     Effort: Pulmonary effort is normal.     Breath sounds: Normal breath sounds.  Abdominal:     General: Bowel sounds are normal.     Palpations: Abdomen is soft.  Musculoskeletal:        General: Normal range of motion.     Cervical back: Normal range of motion.  Skin:    General: Skin is warm and dry.     Comments: Biliary drain site clean, dry, without signs of infection.  Suture in place.  Dressed appropriately.    Neurological:     Mental Status: He is alert and oriented to person, place, and time.  Psychiatric:        Mood and Affect: Mood normal.        Behavior: Behavior normal.     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 Status post placement of right-sided internal/external biliary drain on 03/16/2024 with placement of 10 French drainage catheter. Some fever and elevated bilirubin today. EXAM: CT ABDOMEN AND PELVIS WITH CONTRAST TECHNIQUE: Multidetector CT imaging of the abdomen and pelvis was performed using the standard protocol following bolus administration of intravenous contrast. RADIATION DOSE REDUCTION: This exam was performed according to the  departmental dose-optimization program which includes automated exposure control, adjustment of the mA and/or kV according to patient size and/or use of iterative reconstruction technique. CONTRAST:  OMNIPAQUE  IOHEXOL  300 MG/ML  SOLN COMPARISON:  04/18/2024 FINDINGS: Lower chest: Mild atelectasis at the posterior right lung base. No pleural effusions. Hepatobiliary:  Positioning of a right-sided internal/external biliary drainage catheter is stable entering the right lobe of the liver and extending through the common bile duct and into the duodenum. There is no evidence of biliary obstruction with decompressed right-sided bile ducts and chronic atrophy of the left lobe. The gallbladder is contracted. The previously noted old endoscopic biliary stent has migrated further and is now almost entirely in the duodenum with a short segment remaining in the distal CBD. Pancreas: Unremarkable. No pancreatic ductal dilatation or surrounding inflammatory changes. Spleen: Normal in size without focal abnormality. Adrenals/Urinary Tract: Adrenal glands are unremarkable. Kidneys are normal, without renal calculi, focal lesion, or hydronephrosis. Bladder is unremarkable. Stomach/Bowel: Bowel shows no evidence of obstruction, ileus, inflammation or lesion. The appendix is not discretely visualized. No free intraperitoneal air. Vascular/Lymphatic: Mild atherosclerosis of the abdominal aorta without aneurysm. No lymphadenopathy identified in the abdomen or pelvis. Reproductive: Stable prostate enlargement. Other: No abdominal wall hernia or abnormality. No abdominopelvic ascites. Musculoskeletal: No acute or significant osseous findings. IMPRESSION: 1. Stable positioning of right-sided internal/external biliary drainage catheter. No evidence of biliary obstruction with decompressed right-sided bile ducts and chronic atrophy of the left lobe. 2. The previously noted old endoscopic biliary stent has migrated further and is now  almost entirely in the duodenum with a short segment remaining in the distal CBD. 3. Mild atherosclerosis of the abdominal aorta without aneurysm. 4. Stable prostate enlargement. Electronically Signed   By: Marcey Moan M.D.   On: 04/27/2024 10:46   US  Abdomen Limited RUQ (LIVER/GB) Result Date: 04/27/2024 CLINICAL DATA:  100030 Leukocytosis 100030 , biliary stenosis/obstruction status post ERCP EXAM: ULTRASOUND ABDOMEN LIMITED RIGHT UPPER QUADRANT COMPARISON:  April 18, 2024 FINDINGS: Gallbladder: Decompressed. No gallstones. Echogenic structure in the gallbladder fundus with dirty shadowing, likely intraluminal gas. No wall thickening or pericholecystic fluid. No sonographic Murphy's sign noted by sonographer. Common bile duct: Diameter: 8 mm.  Biliary duct stent in place. Liver: Normal echogenicity. No focal lesion identified. No intrahepatic biliary ductal dilation. Portal vein is patent on color Doppler imaging with normal direction of blood flow towards the liver. Right Kidney: Partially visualized. No mass. No hydronephrosis or nephrolithiasis. Other: None. IMPRESSION: 1. Decompressed gallbladder with a small amount of gas in the lumen. No cholecystolithiasis or changes of acute cholecystitis. 2. Partially visualized biliary duct stent in place. No intrahepatic biliary ductal dilation. Electronically Signed   By: Rogelia Myers M.D.   On: 04/27/2024 10:01   DG Chest Portable 1 View Result Date: 04/26/2024 CLINICAL DATA:  Fever. EXAM: PORTABLE CHEST 1 VIEW COMPARISON:  12/27/2021. FINDINGS: Low lung volume. Mild diffuse pulmonary vascular congestion, overall less in intensity when compared to the prior exam from 12/27/2021. For there are atelectatic changes at the left lung base. Bilateral lung fields are otherwise clear. No acute consolidation or lung collapse. Bilateral costophrenic angles are clear. Stable cardio-mediastinal silhouette. No acute osseous abnormalities. The soft tissues are within  normal limits. IMPRESSION: *Mild diffuse pulmonary vascular congestion, overall less in intensity when compared to the prior exam from 12/27/2021. No acute consolidation or lung collapse. Electronically Signed   By: Ree Molt M.D.   On: 04/26/2024 16:33   US  EKG SITE RITE Result Date: 04/24/2024 If Site Rite image not attached, placement could not be confirmed due to current cardiac rhythm.  ECHOCARDIOGRAM COMPLETE Result Date: 04/18/2024    ECHOCARDIOGRAM REPORT   Patient Name:   WASHINGTON WHEDBEE Date of Exam: 04/18/2024 Medical Rec #:  [id number]  Height:       71.0 in Accession #:    [id number]           Weight:       187.0 lb Date of Birth:  [birth year]            BSA:          2.049 m Patient Age:    78 years             BP:           86/53 mmHg Patient Gender: M                    HR:           99 bpm. Exam Location:  [facility] Procedure: 2D Echo, Color Doppler, Cardiac Doppler and Intracardiac            Opacification Agent (Both Spectral and Color Flow Doppler were            utilized during procedure). Indications:     CHF-Acute Systolic I50.21  History:         Patient has prior history of Echocardiogram examinations, most                  recent 12/28/2021. CHF.  Sonographer:     Ashley McNeely-Sloane Referring Phys:  [id number] ELVAN SOR Diagnosing Phys: Dwayne D Callwood MD IMPRESSIONS  1. Left ventricular ejection fraction, by estimation, is 50 to 55%. The left ventricle has low normal function. The left ventricle has no regional wall motion abnormalities. Left ventricular diastolic parameters are consistent with Grade I diastolic dysfunction (impaired relaxation).  2. Right ventricular systolic function is normal. The right ventricular size is normal.  3. The mitral valve is normal in structure. Trivial mitral valve regurgitation.  4. The aortic valve is normal in structure. Aortic valve regurgitation is not visualized. FINDINGS  Left Ventricle: Left ventricular ejection fraction, by  estimation, is 50 to 55%. The left ventricle has low normal function. The left ventricle has no regional wall motion abnormalities. Definity  contrast agent was given IV to delineate the left ventricular endocardial borders. Strain was performed and the global longitudinal strain is indeterminate. Global longitudinal strain performed but not reported based on interpreter judgement due to suboptimal tracking. The left ventricular internal cavity size was normal in size. There is borderline left ventricular hypertrophy. Left ventricular diastolic parameters are consistent with Grade I diastolic dysfunction (impaired relaxation). Right Ventricle: The right ventricular size is normal. No increase in right ventricular wall thickness. Right ventricular systolic function is normal. Left Atrium: Left atrial size was normal in size. Right Atrium: Right atrial size was normal in size. Pericardium: There is no evidence of pericardial effusion. Mitral Valve: The mitral valve is normal in structure. Trivial mitral valve regurgitation. MV peak gradient, 3.1 mmHg. The mean mitral valve gradient is 2.0 mmHg. Tricuspid Valve: The tricuspid valve is normal in structure. Tricuspid valve regurgitation is mild. Aortic Valve: The aortic valve is normal in structure. Aortic valve regurgitation is not visualized. Aortic valve mean gradient measures 2.0 mmHg. Aortic valve peak gradient measures 3.4 mmHg. Aortic valve area, by VTI measures 4.02 cm. Pulmonic Valve: The pulmonic valve was normal in structure. Pulmonic valve regurgitation is not visualized. Aorta: The ascending aorta was not well visualized. IAS/Shunts: No atrial level shunt detected by color flow Doppler. Additional Comments: 3D was performed not requiring image post processing on an independent workstation and was indeterminate.  LEFT  VENTRICLE PLAX 2D LVIDd:         5.10 cm     Diastology LVIDs:         3.13 cm     LV e' medial:    7.18 cm/s LV PW:         1.10 cm     LV  E/e' medial:  10.8 LV IVS:        1.10 cm     LV e' lateral:   9.46 cm/s LVOT diam:     2.30 cm     LV E/e' lateral: 8.2 LV SV:         60 LV SV Index:   29 LVOT Area:     4.15 cm  LV Volumes (MOD) LV vol d, MOD A2C: 98.3 ml LV vol d, MOD A4C: 81.0 ml LV vol s, MOD A2C: 44.8 ml LV vol s, MOD A4C: 61.3 ml LV SV MOD A2C:     53.5 ml LV SV MOD A4C:     81.0 ml LV SV MOD BP:      41.5 ml RIGHT VENTRICLE RV Basal diam:  3.90 cm RV Mid diam:    3.20 cm RV S prime:     10.30 cm/s TAPSE (M-mode): 1.8 cm LEFT ATRIUM             Index        RIGHT ATRIUM           Index LA diam:        3.60 cm 1.76 cm/m   RA Area:     18.20 cm LA Vol (A2C):   42.2 ml 20.59 ml/m  RA Volume:   50.70 ml  24.74 ml/m LA Vol (A4C):   34.5 ml 16.84 ml/m LA Biplane Vol: 39.8 ml 19.42 ml/m  AORTIC VALVE                    PULMONIC VALVE AV Area (Vmax):    3.64 cm     PV Vmax:        0.87 m/s AV Area (Vmean):   3.53 cm     PV Vmean:       62.600 cm/s AV Area (VTI):     4.02 cm     PV VTI:         0.155 m AV Vmax:           92.00 cm/s   PV Peak grad:   3.1 mmHg AV Vmean:          64.200 cm/s  PV Mean grad:   2.0 mmHg AV VTI:            0.150 m      RVOT Peak grad: 2 mmHg AV Peak Grad:      3.4 mmHg AV Mean Grad:      2.0 mmHg LVOT Vmax:         80.70 cm/s LVOT Vmean:        54.500 cm/s LVOT VTI:          0.145 m LVOT/AV VTI ratio: 0.97  AORTA Ao Root diam: 3.60 cm Ao Asc diam:  3.10 cm MITRAL VALVE MV Area (PHT): 5.54 cm     SHUNTS MV Area VTI:   2.63 cm     Systemic VTI:  0.14 m MV Peak grad:  3.1 mmHg     Systemic Diam: 2.30 cm MV Mean grad:  2.0 mmHg     Pulmonic  VTI:  0.108 m MV Vmax:       0.88 m/s MV Vmean:      61.5 cm/s MV Decel Time: 137 msec MV E velocity: 77.60 cm/s MV A velocity: 105.00 cm/s MV E/A ratio:  0.74 Cara JONETTA Lovelace MD Electronically signed by Cara JONETTA Lovelace MD Signature Date/Time: 04/18/2024/10:22:11 PM    Final    US  RENAL Result Date: 04/18/2024 CLINICAL DATA:  [id number] AKI (acute kidney injury) (HCC) [id number]  EXAM: RENAL / URINARY TRACT ULTRASOUND COMPLETE COMPARISON:  Mar 10, 2024 FINDINGS: Right Kidney: Renal measurements: 13.1 x 6.3 x 10.3 cm = volume: 273 mL. Normal echogenicity. 1.2 x 0.8 x 0.9 cm cyst in the upper pole. No hydronephrosis or nephrolithiasis. Left Kidney: Renal measurements: 12 x 5.4 x 5.3 cm = volume: 181 mL. Normal echogenicity. A couple of small cysts in the left kidney measuring up to 1.6 x 1.4 x 1.6 cm. No hydronephrosis or nephrolithiasis. Bladder: Circumferential wall thickening with trabeculation. Abnormal postvoid residual of 121 mL. Other: None. IMPRESSION: 1. No hydronephrosis or nephrolithiasis. 2. Circumferential wall thickening of the urinary bladder, which in the setting of prostatomegaly, may represent changes of chronic bladder outlet obstruction. If there is concern for acute cystitis, correlation with urinalysis would be recommended. 3. Abnormal postvoid residual of 121 mL. Electronically Signed   By: Rogelia Myers M.D.   On: 04/18/2024 16:00   CT Angio Chest PE W/Cm &/Or Wo Cm Result Date: 04/18/2024 CLINICAL DATA:  Pulmonary embolus suspected with high probability. Fever developed after biliary procedure today. EXAM: CT ANGIOGRAPHY CHEST CT ABDOMEN AND PELVIS WITH CONTRAST TECHNIQUE: Multidetector CT imaging of the chest was performed using the standard protocol during bolus administration of intravenous contrast. Multiplanar CT image reconstructions and MIPs were obtained to evaluate the vascular anatomy. Multidetector CT imaging of the abdomen and pelvis was performed using the standard protocol during bolus administration of intravenous contrast. RADIATION DOSE REDUCTION: This exam was performed according to the departmental dose-optimization program which includes automated exposure control, adjustment of the mA and/or kV according to patient size and/or use of iterative reconstruction technique. CONTRAST:  OMNIPAQUE  IOHEXOL  350 MG/ML SOLN COMPARISON:  PET CT  04/12/2024.  CT chest 08/14/2020 FINDINGS: CTA CHEST FINDINGS Cardiovascular: Technically adequate study with good opacification of the central and segmental pulmonary arteries. Moderate motion artifact. No focal filling defects are demonstrated. No evidence of significant pulmonary embolus. Normal heart size. No pericardial effusions. Normal caliber thoracic aorta. No dissection. Great vessel origins are patent. Mediastinum/Nodes: No enlarged mediastinal, hilar, or axillary lymph nodes. Thyroid gland, trachea, and esophagus demonstrate no significant findings. Lungs/Pleura: Patchy peripheral infiltrates in the lungs may represent scarring or multifocal pneumonia. Similar appearance to previous study. No pleural effusion or pneumothorax. 7 mm diameter nodule in the right middle lung, series 3, image 70. Additional smaller nodule seen in both lungs. Musculoskeletal: Degenerative changes in the spine. No acute bony abnormalities. Review of the MIP images confirms the above findings. CT ABDOMEN and PELVIS FINDINGS Hepatobiliary: Residual contrast material demonstrated within mildly dilated intrahepatic bile ducts in the left lobe. A percutaneous biliary drainage catheter is present with tip in the duodenum. No focal liver lesions are seen. Extrahepatic bile ducts are not dilated. Gas in the gallbladder is likely result of procedure. Pancreas: Unremarkable. No pancreatic ductal dilatation or surrounding inflammatory changes. Spleen: Normal in size without focal abnormality. Adrenals/Urinary Tract: Adrenal glands are unremarkable. Kidneys are normal, without renal calculi, focal lesion, or hydronephrosis. Bladder is unremarkable.  Stomach/Bowel: Stomach, small bowel, and colon are not abnormally distended. No wall thickening or inflammatory changes. Appendix is not identified. Vascular/Lymphatic: Aortic atherosclerosis. No enlarged abdominal or pelvic lymph nodes. Reproductive: Prostate gland is enlarged Other: No free  air or free fluid in the abdomen. No loculated collections are identified. Abdominal wall musculature appears intact. Musculoskeletal: Degenerative changes in the spine. No acute bony abnormalities. Review of the MIP images confirms the above findings. IMPRESSION: 1. Postprocedural changes in the liver with percutaneous biliary drain in place. Tip is in the duodenum. Residual contrast material within mildly dilated left intrahepatic bile ducts. Gas in the gallbladder is also likely postprocedural. 2. No abscess or free air in the abdomen. 3. No evidence of significant pulmonary embolus. 4. Patchy peripheral infiltrates in the lungs possibly representing multifocal pneumonia or scarring. 5. Scattered nodules in the lungs, largest in the right middle lung measuring 7 mm diameter. Non-contrast chest CT at 3-6 months is recommended. If the nodules are stable at time of repeat CT, then future CT at 18-24 months (from today's scan) is considered optional for low-risk patients, but is recommended for high-risk patients. This recommendation follows the consensus statement: Guidelines for Management of Incidental Pulmonary Nodules Detected on CT Images: From the Fleischner Society 2017; Radiology 2017; 284:228-243. 6. Aortic atherosclerosis. 7. 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 Electronically Signed   By: Elsie Gravely M.D.   On: 04/18/2024 02:13   CT ABDOMEN PELVIS W CONTRAST Result Date: 04/18/2024 CLINICAL DATA:  Pulmonary embolus suspected with high probability. Fever developed after biliary procedure today. EXAM: CT ANGIOGRAPHY CHEST CT ABDOMEN AND PELVIS WITH CONTRAST TECHNIQUE: Multidetector CT imaging of the chest was performed using the standard protocol during bolus administration of intravenous contrast. Multiplanar CT image reconstructions and MIPs were obtained to evaluate the vascular anatomy. Multidetector CT imaging of the abdomen and pelvis was performed using the standard protocol during bolus administration  of intravenous contrast. RADIATION DOSE REDUCTION: This exam was performed according to the departmental dose-optimization program which includes automated exposure control, adjustment of the mA and/or kV according to patient size and/or use of iterative reconstruction technique. CONTRAST:  OMNIPAQUE  IOHEXOL  350 MG/ML SOLN COMPARISON:  PET CT 04/12/2024.  CT chest 08/14/2020 FINDINGS: CTA CHEST FINDINGS Cardiovascular: Technically adequate study with good opacification of the central and segmental pulmonary arteries. Moderate motion artifact. No focal filling defects are demonstrated. No evidence of significant pulmonary embolus. Normal heart size. No pericardial effusions. Normal caliber thoracic aorta. No dissection. Great vessel origins are patent. Mediastinum/Nodes: No enlarged mediastinal, hilar, or axillary lymph nodes. Thyroid gland, trachea, and esophagus demonstrate no significant findings. Lungs/Pleura: Patchy peripheral infiltrates in the lungs may represent scarring or multifocal pneumonia. Similar appearance to previous study. No pleural effusion or pneumothorax. 7 mm diameter nodule in the right middle lung, series 3, image 70. Additional smaller nodule seen in both lungs. Musculoskeletal: Degenerative changes in the spine. No acute bony abnormalities. Review of the MIP images confirms the above findings. CT ABDOMEN and PELVIS FINDINGS Hepatobiliary: Residual contrast material demonstrated within mildly dilated intrahepatic bile ducts in the left lobe. A percutaneous biliary drainage catheter is present with tip in the duodenum. No focal liver lesions are seen. Extrahepatic bile ducts are not dilated. Gas in the gallbladder is likely result of procedure. Pancreas: Unremarkable. No pancreatic ductal dilatation or surrounding inflammatory changes. Spleen: Normal in size without focal abnormality. Adrenals/Urinary Tract: Adrenal glands are unremarkable. Kidneys are normal, without renal calculi,  focal lesion, or hydronephrosis. Bladder is  unremarkable. Stomach/Bowel: Stomach, small bowel, and colon are not abnormally distended. No wall thickening or inflammatory changes. Appendix is not identified. Vascular/Lymphatic: Aortic atherosclerosis. No enlarged abdominal or pelvic lymph nodes. Reproductive: Prostate gland is enlarged Other: No free air or free fluid in the abdomen. No loculated collections are identified. Abdominal wall musculature appears intact. Musculoskeletal: Degenerative changes in the spine. No acute bony abnormalities. Review of the MIP images confirms the above findings. IMPRESSION: 1. Postprocedural changes in the liver with percutaneous biliary drain in place. Tip is in the duodenum. Residual contrast material within mildly dilated left intrahepatic bile ducts. Gas in the gallbladder is also likely postprocedural. 2. No abscess or free air in the abdomen. 3. No evidence of significant pulmonary embolus. 4. Patchy peripheral infiltrates in the lungs possibly representing multifocal pneumonia or scarring. 5. Scattered nodules in the lungs, largest in the right middle lung measuring 7 mm diameter. Non-contrast chest CT at 3-6 months is recommended. If the nodules are stable at time of repeat CT, then future CT at 18-24 months (from today's scan) is considered optional for low-risk patients, but is recommended for high-risk patients. This recommendation follows the consensus statement: Guidelines for Management of Incidental Pulmonary Nodules Detected on CT Images: From the Fleischner Society 2017; Radiology 2017; 284:228-243. 6. Aortic atherosclerosis. 7. 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 Electronically Signed   By: Elsie Gravely M.D.   On: 04/18/2024 02:13   DG C-Arm 1-60 Min-No Report Result Date: 04/17/2024 Fluoroscopy was utilized by the requesting physician.  No radiographic interpretation.   NM PET Image Initial (PI) Skull Base To Thigh (F-18 FDG) Addendum Date: 04/12/2024 ADDENDUM  REPORT: 04/12/2024 11:23 ADDENDUM: Additional impression. There is some asymmetric uptake in the area of the sella turcica/pituitary. Dedicated brain imaging could be performed with MRI as clinically appropriate to exclude lesion. Please correlate for any history Electronically Signed   By: Ranell Bring M.D.   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 EXAM: NUCLEAR MEDICINE PET SKULL BASE TO THIGH TECHNIQUE: 10.5 mCi F-18 FDG was injected intravenously. Full-ring PET imaging was performed from the skull base to thigh after the radiotracer. CT data was obtained and used for attenuation correction and anatomic localization. Fasting blood glucose: 108 mg/dl COMPARISON:  MRI abdomen 03/14/2024. CT abdomen pelvis 03/10/2024. CTA chest 08/14/2020. FINDINGS: Mediastinal blood pool activity: SUV max 2.5 Liver activity: SUV max 3.2 NECK: There is no specific abnormal uptake identified in the neck including along lymph node change of the submandibular, posterior triangle or internal jugular regions. Near symmetric uptake of the visualized intracranial compartment. However there is some uptake in the area of the sella. Please correlate for any known history. Dedicated MRI could be performed clinically appropriate. Incidental CT findings: Visualized paranasal sinuses and mastoid air cells are clear. Left sided concha bullosa. The parotid glands, submandibular glands and thyroid gland are unremarkable. Scattered vascular calcifications. CHEST: No specific abnormal radiotracer uptake identified above blood pool in the axillary regions, hilum or mediastinum. There is a small focus of uptake within the left upper lobe with maximum SUV of 2.3. This is seen on PET image 38. There is no CT correlate to a nodule in this location. Diameter of uptake approaches 6 mm. There is a nodule in the middle lobe however on CT image 54 measuring 5 mm which  does not show any abnormal uptake and was present on the CT of 2021 demonstrating long-term stability. Incidental CT findings:  Breathing motion. There is some linear opacity lung bases likely scar or atelectasis. Some wispy ground-glass areas are identified which could be scarring or fibrotic changes based on the prior CT scan. Heart is enlarged. Scattered vascular calcifications are seen including along the coronary arteries. Patulous esophagus. ABDOMEN/PELVIS: There is a focal area of uptake which is somewhat linear in configuration with maximum SUV value of 8.0 centered at the hilum of the liver extending to the left hepatic lobe which is in the location of the stricture seen on the previous MRI and may correspond to the area of neoplasm. There is a more subtle area of uptake identified towards the ampulla measuring maximum SUV of 6.2 but this is in the exact location of the stents. One being a right hepatic lobe PTC in the other endoscopic stent extending towards the left. This could be more artifactual as there is no clear mass lesion in this location on previous exam. Elsewhere there is physiologic distribution radiotracer along the parenchymal organs, bowel and renal collecting systems. No clear abnormal nodal uptake identified. Incidental CT findings: S dated placement of biliary stents. The biliary ductal dilatation in the right hepatic lobe is improved. The left hepatic lobe is also slightly improved with some residual. There is atrophy of the left hepatic lobe lateral segment. Air in the nondilated gallbladder. Mild global atrophy of the pancreas. The spleen, adrenal glands are grossly preserved on this noncontrast attenuation correction CT. Renal cysts are again identified as on prior MRI. No abnormal calcifications seen within either kidney nor along the course of either ureter. Underdistended urinary bladder. Heterogeneous enlarged prostate. Large bowel has a normal course and caliber with scattered  stool. Colonic diverticula identified. Stomach is underdistended. Small bowel is nondilated. Of note the cecum resides in the anterior right hemipelvis. Scattered vascular calcifications. Normal caliber aorta and IVC. Few small scattered nodes identified in the abdomen pelvis, not pathologic by size criteria. Small fat containing inguinal hernias. SKELETON: No abnormal uptake along the visualized osseous structures. Incidental CT findings: Scattered diffuse degenerative changes identified. Curvature of the spine. IMPRESSION: Focal area of uptake identified in the hilum of the liver at the location of strictures seen by previous imaging consistent with area of neoplasm. No other areas of abnormal uptake at this time suggest additional areas of disease. Indwelling right hepatic lobe PTC and endoscopic left-sided stent. Some improvement of the intrahepatic biliary ductal dilatation with residual particularly along the left hepatic lobe. Left hepatic lobe is also atrophic. There is a small focus of asymmetric uptake along the left upper lobe of the lung without a corresponding lung nodule on CT scan. In the setting recommend a dedicated high-resolution CT scan for further spacer resolution to see if there is an underlying lesion versus a follow up CT in 3 months. Residual areas of scarring and fibrotic changes in a similar distribution to the abnormality on prior remote CT scan of the chest. There is a right-sided lung nodule which is stable since 2021 has no abnormal uptake. No additional follow-up of this specific nodule. Electronically Signed: By: Ranell Bring M.D. On: 04/12/2024 10:38    Labs:  CBC: Recent Labs    04/23/24 0249 04/24/24 0707 04/26/24 1602 04/27/24 0426  WBC 7.1 8.2 17.4* 13.2*  HGB 11.3* 12.1* 11.8* 11.0*  HCT 33.6* 34.8* 34.6* 32.9*  PLT 168 204 324 317    COAGS: Recent Labs    03/16/24 0923 04/26/24 1602 04/27/24 0426  INR 1.0 1.1 1.2  BMP: Recent Labs     04/23/24 0249 04/24/24 0707 04/26/24 1602 04/27/24 0426  NA 137 138 137 135  K 3.6 3.8 3.8 3.3*  CL 105 102 101 101  CO2 27 26 25 25   GLUCOSE 117* 121* 138* 116*  BUN 17 16 16 14   CALCIUM  7.6* 8.1* 8.0* 7.8*  CREATININE 1.11 0.95 1.10 0.90  GFRNONAA >60 >60 >60 >60    LIVER FUNCTION TESTS: Recent Labs    04/23/24 0249 04/24/24 0707 04/26/24 1602 04/27/24 0426  BILITOT 1.1 1.2 0.9 1.6*  AST 25 23 33 21  ALT 33 31 38 34  ALKPHOS 81 106 177* 145*  PROT 5.7* 5.6* 6.2* 5.6*  ALBUMIN 2.2* 2.3* 2.5* 2.3*    TUMOR MARKERS: No results for input(s): AFPTM, CEA, CA199, CHROMGRNA in the last 8760 hours.  Assessment and Plan:  Pt is with sepsis and current biliary drain with concern for blockage/malposition.    CT A/P with contrast was ordered and obtained which revealed biliary drain in position, stable, and no biliary ductal dilatation.   Exam was performed at bedside.  Drain site clean, dry, suture in place.  No signs of infection, dressed appropriately.  Drain F/A easily - 20 mL saline was flushed and aspirated at bedside without difficulty. Some discomfort noted by Mr. Haslem at time of flush.  Bag was exchanged.    Image guided biliary drain exchange and brush biopsy was discussed at length with Mr. Serano and his wife.  Pt did sign the consent form, but continued to question the necessity of the procedure - repeating several questions which I was happy to re-explain and re-assure to both him and his wife.  There may be some health literacy barriers here.    A plan was made to set pt up for the procedure tomorrow and have IR check in before bringing him down.    Risks and benefits of biliary drain exchange and biopsy discussed with the patient including, but not limited to bleeding, infection which may lead to sepsis or even death and damage to adjacent structures.  This interventional procedure involves the use of X-rays and because of the nature of the  planned procedure, it is possible that we will have prolonged use of X-ray fluoroscopy.  Potential radiation risks to you include (but are not limited to) the following: - A slightly elevated risk for cancer  several years later in life. This risk is typically less than 0.5% percent. This risk is low in comparison to the normal incidence of human cancer, which is 33% for women and 50% for men according to the [employer]. - Radiation induced injury can include skin redness, resembling a rash, tissue breakdown / ulcers and hair loss (which can be temporary or permanent).   The likelihood of either of these occurring depends on the difficulty of the procedure and whether you are sensitive to radiation due to previous procedures, disease, or genetic conditions.   IF your procedure requires a prolonged use of radiation, you will be notified and given written instructions for further action.  It is your responsibility to monitor the irradiated area for the 2 weeks following the procedure and to notify your physician if you are concerned that you have suffered a radiation induced injury.    All of the patient's questions were answered.  Consent signed and in IR.  Thank you for allowing our service to participate in DAIMIAN SUDBERRY 's care.  Electronically Signed: Lavanda JAYSON Jurist,  PA-C   04/27/2024, 11:19 AM    I spent a total of 40 Minutes    in face to face in clinical consultation, greater than 50% of which was counseling/coordinating care for biliary drain exchange and biopsy.

## 2024-04-27 NOTE — Hospital Course (Addendum)
 Taken from H&P.  Casey Peters is a 78 y.o. 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 coli bacteremia with percutaneous biliary drain in place for which he was hospitalized from 04/18/2024 to 04/24/2024 and discharged on IV ertapenem , presented to ED with complaints of not feeling well and fever.  Temperature at home reportedly 101.6, he was given Tylenol  and 500 cc bolus en route via EMS per ED triage note.   On presentation hemodynamically stable and afebrile, labs with worsening leukocytosis at 17, alkaline phosphatase elevated at 177, it was 106 previously. UA negative for UTI, respiratory panel is negative.  Chest x-ray unremarkable.  Blood cultures were redrawn and patient was continued on ertapenem .  7/17: Hemodynamically stable, some improvement in alkaline phosphatase and T. bili at 1.6.  Right upper quadrant ultrasound with decompressed gallbladder and a small amount of gas in the lumen, no cholelithiasis or changes of acute cholecystitis.  CT abdomen with contrast with stable positioning of right sided internal/external biliary drainage catheter.  No evidence of biliary obstruction with decompressed right-sided bile ducts and chronic atrophy of left lobe.  The biliary stent has migrated further and is now almost entirely in the duodenum with a short segment remaining in the distal CBD.  IR is going to exchange the percutaneous drain tomorrow.  They were requested to obtain some brushings for biopsy as there is still no definitive diagnosis.  7/18: Remained hemodynamically stable with improvement in alkaline phosphatase and T. bili s/p drain exchange and brush biopsy of the stricture site by IR today.  Message sent to his oncologist for follow-up on pathology results.  There is still no definitive  diagnosis but concern remained high for underlying malignancy.  Patient tolerated the procedure well and wants to go home.  He will resume his home ertapenem  to complete his antibiotic course for prior ESBL E. coli bacteremia.  Patient will continue on his home medications and follow-up with his providers for further assistance.

## 2024-04-27 NOTE — Assessment & Plan Note (Signed)
 Seems well-controlled with most recent A1c of 6.9. Uses Amaryl and Januvia at home -SSI

## 2024-04-27 NOTE — Consult Note (Signed)
 Pharmacy Antibiotic Note  Casey Peters is a 78 y.o. male admitted on 04/26/2024 with fever and multifocal pneumonia.  Patient was discharged on ertapenem  1 g daily for 6 days due to E. coli bacteremia and biliary obstruction.  With a plan to pull the midline on 05/02/2024. Pharmacy has been consulted for Vancomycin  dosing. Will continue Ertapenem  1g Q24 hours, as well, while admitted.  Plan: Vancomycin  1750mg  IV x 1 given as loading dose, followed by: Vancomycin  1000 mg IV Q 12 hrs. Goal AUC 400-550. Expected AUC: 515.4 Expected Cmin: 15.0 SCr used: 0.9, Vd used: 0.72   Height: 5' 11 (180.3 cm) Weight: 81.5 kg (179 lb 9.6 oz) IBW/kg (Calculated) : 75.3  Temp (24hrs), Avg:99 F (37.2 C), Min:98.1 F (36.7 C), Max:100 F (37.8 C)  Recent Labs  Lab 04/22/24 0316 04/23/24 0249 04/24/24 0707 04/26/24 1602 04/27/24 0426  WBC 6.7 7.1 8.2 17.4* 13.2*  CREATININE 1.18 1.11 0.95 1.10 0.90  LATICACIDVEN  --   --   --  1.7  --     Estimated Creatinine Clearance: 74.4 mL/min (by C-G formula based on SCr of 0.9 mg/dL).    Allergies  Allergen Reactions   Citalopram Nausea And Vomiting    Dizziness, unsteady, balanced issues   Sulfamethoxazole -Trimethoprim      Fatigue and muscle ache Other reaction(s): Other (See Comments) Fatigue and muscle ache   Trimethoprim  Other (See Comments)    Fatigue, hypotension    Antimicrobials this admission: Vancomycin  7/16 >>  Ertapenem  7/16 >>  Cefepime  7/16 x 1  Dose adjustments this admission: N/A  Microbiology results: 7/16 BCx: collected 7/17 MRSA PCR: ordered  Thank you for allowing pharmacy to be a part of this patient's care.  Yoan Sallade A Shequita Peplinski 04/27/2024 10:38 AM

## 2024-04-27 NOTE — Progress Notes (Signed)
 Rogelia Copping, MD [facility]   [address] [place], [place] [address] Phone: [phone] Fax : [phone]   Subjective: This patient is well-known to me and has a percutaneous drain placed by interventional radiology and a stent placed by ERCP by me with further brushings done at that time.  The brushings came back as reactive and prior brushings showed atypical cells without the source of the patient's biliary stricture seen.  The patient was sent home on antibiotics and continues to have tachycardia and fevers.  CT scan of the abdomen today showed:  IMPRESSION: 1. Stable positioning of right-sided internal/external biliary drainage catheter. No evidence of biliary obstruction with decompressed right-sided bile ducts and chronic atrophy of the left lobe. 2. The previously noted old endoscopic biliary stent has migrated further and is now almost entirely in the duodenum with a short segment remaining in the distal CBD. 3. Mild atherosclerosis of the abdominal aorta without aneurysm. 4. Stable prostate enlargement.  I am now asked to see the patient for possible cholangitis.   Objective: Vital signs in last 24 hours: Vitals:   04/27/24 0652 04/27/24 1032 04/27/24 1040 04/27/24 1421  BP: 115/64  108/64 110/63  Pulse: 80  77 73  Resp: (!) 23  (!) 22 20  Temp: 98.7 F (37.1 C) 98.1 F (36.7 C)    TempSrc: Oral Oral    SpO2: 98%  98% 100%  Weight:      Height:       Weight change:   Intake/Output Summary (Last 24 hours) at 04/27/2024 1438 Last data filed at 04/27/2024 1153 Gross per 24 hour  Intake --  Output 1160 ml  Net -1160 ml     Exam: Heart:: Regular rate and rhythm or without murmur or extra heart sounds Lungs: normal and clear to auscultation and percussion    Lab Results: @LABTEST2 @ Micro Results: Recent Results (from the past 240 hours)  Blood culture (routine x 2)     Status: Abnormal   Collection Time: 04/17/24 11:29 PM   Specimen: Right  Antecubital; Blood  Result Value Ref Range Status   Specimen Description   Final    RIGHT ANTECUBITAL Performed at [facility], [address], [place], [place] [address]    Special Requests   Final    BOTTLES DRAWN AEROBIC AND ANAEROBIC Blood Culture adequate volume Performed at [facility], [address] [place], [place] [address]    Culture  Setup Time   Final    GRAM NEGATIVE RODS IN BOTH AEROBIC AND ANAEROBIC BOTTLES GRAM STAIN REVIEWED-AGREE WITH RESULT DRT CRITICAL RESULT CALLED TO, READ BACK BY AND VERIFIED WITH: TREY GREENWOOD AT 1020 04/18/24 RAM Performed at [facility], [address] [place], [place] [address]    Culture ESCHERICHIA COLI (A)  Final   Report Status 04/20/2024 FINAL  Final   Organism ID, Bacteria ESCHERICHIA COLI  Final      Susceptibility   Escherichia coli - MIC*    AMPICILLIN >=32 RESISTANT Resistant     CEFEPIME  <=0.12 SENSITIVE Sensitive     CEFTAZIDIME 4 SENSITIVE Sensitive     CEFTRIAXONE  0.5 SENSITIVE Sensitive     CIPROFLOXACIN  >=4 RESISTANT Resistant     GENTAMICIN  <=1 SENSITIVE Sensitive     IMIPENEM 0.5 SENSITIVE Sensitive     TRIMETH /SULFA  <=20 SENSITIVE Sensitive     AMPICILLIN/SULBACTAM >=32 RESISTANT Resistant     PIP/TAZO <=4 SENSITIVE Sensitive ug/mL    CEFAZOLIN RESISTANT Resistant     *  ESCHERICHIA COLI  Blood Culture ID Panel (Reflexed)     Status: Abnormal   Collection Time: 04/17/24 11:29 PM  Result Value Ref Range Status   Enterococcus faecalis NOT DETECTED NOT DETECTED Final   Enterococcus Faecium NOT DETECTED NOT DETECTED Final   Listeria monocytogenes NOT DETECTED NOT DETECTED Final   Staphylococcus species NOT DETECTED NOT DETECTED Final   Staphylococcus aureus (BCID) NOT DETECTED NOT DETECTED Final   Staphylococcus epidermidis NOT DETECTED NOT DETECTED Final   Staphylococcus lugdunensis NOT DETECTED NOT DETECTED Final   Streptococcus species NOT DETECTED NOT DETECTED Final    Streptococcus agalactiae NOT DETECTED NOT DETECTED Final   Streptococcus pneumoniae NOT DETECTED NOT DETECTED Final   Streptococcus pyogenes NOT DETECTED NOT DETECTED Final   A.calcoaceticus-baumannii NOT DETECTED NOT DETECTED Final   Bacteroides fragilis NOT DETECTED NOT DETECTED Final   Enterobacterales DETECTED (A) NOT DETECTED Final    Comment: Enterobacterales represent a large order of gram negative bacteria, not a single organism. CRITICAL RESULT CALLED TO, READ BACK BY AND VERIFIED WITH: TREY GREENWOOD AT 1020 04/18/24 RAM    Enterobacter cloacae complex NOT DETECTED NOT DETECTED Final   Escherichia coli DETECTED (A) NOT DETECTED Final    Comment: RESULT CALLED TO, READ BACK BY AND VERIFIED WITH: TREY GREENWOOD AT 1020 04/18/24 RAM CRITICAL RESULT CALLED TO, READ BACK BY AND VERIFIED WITH:    Klebsiella aerogenes NOT DETECTED NOT DETECTED Final   Klebsiella oxytoca NOT DETECTED NOT DETECTED Final   Klebsiella pneumoniae NOT DETECTED NOT DETECTED Final   Proteus species NOT DETECTED NOT DETECTED Final   Salmonella species NOT DETECTED NOT DETECTED Final   Serratia marcescens NOT DETECTED NOT DETECTED Final   Haemophilus influenzae NOT DETECTED NOT DETECTED Final   Neisseria meningitidis NOT DETECTED NOT DETECTED Final   Pseudomonas aeruginosa NOT DETECTED NOT DETECTED Final   Stenotrophomonas maltophilia NOT DETECTED NOT DETECTED Final   Candida albicans NOT DETECTED NOT DETECTED Final   Candida auris NOT DETECTED NOT DETECTED Final   Candida glabrata NOT DETECTED NOT DETECTED Final   Candida krusei NOT DETECTED NOT DETECTED Final   Candida parapsilosis NOT DETECTED NOT DETECTED Final   Candida tropicalis NOT DETECTED NOT DETECTED Final   Cryptococcus neoformans/gattii NOT DETECTED NOT DETECTED Final   CTX-M ESBL NOT DETECTED NOT DETECTED Final   Carbapenem resistance IMP NOT DETECTED NOT DETECTED Final   Carbapenem resistance KPC NOT DETECTED NOT DETECTED Final   Carbapenem  resistance NDM NOT DETECTED NOT DETECTED Final   Carbapenem resist OXA 48 LIKE NOT DETECTED NOT DETECTED Final   Carbapenem resistance VIM NOT DETECTED NOT DETECTED Final    Comment: Performed at [facility], [address] [place], [place] [address]  Blood culture (routine x 2)     Status: Abnormal   Collection Time: 04/18/24  2:46 AM   Specimen: BLOOD  Result Value Ref Range Status   Specimen Description   Final    BLOOD RIGHT ARM Performed at [facility], [address] [place], [place] [address]    Special Requests   Final    BOTTLES DRAWN AEROBIC AND ANAEROBIC Blood Culture results may not be optimal due to an inadequate volume of blood received in culture bottles Performed at [facility], [address] [place], [place] [address]    Culture  Setup Time   Final    GRAM NEGATIVE RODS ANAEROBIC BOTTLE ONLY RESULT CALLED TO, READ BACK BY AND VERIFIED WITH: TREY GREENWOOD AT 1420  04/18/24 RAM Performed at [facility], [address] [place], [place] [address]    Culture (A)  Final    ESCHERICHIA COLI SUSCEPTIBILITIES PERFORMED ON PREVIOUS CULTURE WITHIN THE LAST 5 DAYS. Performed at [facility], [address] [place], [place] [address]    Report Status 04/20/2024 FINAL  Final  MRSA Next Gen by PCR, Nasal     Status: None   Collection Time: 04/18/24  8:09 PM   Specimen: Nasal Mucosa; Nasal Swab  Result Value Ref Range Status   MRSA by PCR Next Gen NOT DETECTED NOT DETECTED Final    Comment: (NOTE) The GeneXpert MRSA Assay (FDA approved for NASAL specimens only), is one component of a comprehensive MRSA colonization surveillance program. It is not intended to diagnose MRSA infection nor to guide or monitor treatment for MRSA infections. Test performance is not FDA approved in patients less than 9 years old. Performed at [facility], [address] [place], [place] [address]   Culture, blood (Routine X 2) w  Reflex to ID Panel     Status: None   Collection Time: 04/20/24  4:23 AM   Specimen: BLOOD  Result Value Ref Range Status   Specimen Description BLOOD BLOOD RIGHT ARM  Final   Special Requests   Final    BOTTLES DRAWN AEROBIC AND ANAEROBIC Blood Culture adequate volume   Culture   Final    NO GROWTH 5 DAYS Performed at [facility], [address], [place], [place] [address]    Report Status 04/25/2024 FINAL  Final  Culture, blood (Routine X 2) w Reflex to ID Panel     Status: None   Collection Time: 04/20/24  4:23 AM   Specimen: BLOOD  Result Value Ref Range Status   Specimen Description BLOOD BLOOD RIGHT HAND  Final   Special Requests IN PEDIATRIC BOTTLE Blood Culture adequate volume  Final   Culture   Final    NO GROWTH 5 DAYS Performed at [facility], [address], [place], [place] [address]    Report Status 04/25/2024 FINAL  Final  Culture, blood (Routine x 2)     Status: None (Preliminary result)   Collection Time: 04/26/24  4:02 PM   Specimen: BLOOD  Result Value Ref Range Status   Specimen Description BLOOD BLOOD LEFT ARM  Final   Special Requests   Final    BOTTLES DRAWN AEROBIC AND ANAEROBIC Blood Culture adequate volume   Culture   Final    NO GROWTH < 24 HOURS Performed at [facility], [address] [place], [place] [address]    Report Status PENDING  Incomplete  Culture, blood (Routine x 2)     Status: None (Preliminary result)   Collection Time: 04/26/24  4:03 PM   Specimen: BLOOD  Result Value Ref Range Status   Specimen Description BLOOD BLOOD RIGHT ARM  Final   Special Requests   Final    BOTTLES DRAWN AEROBIC AND ANAEROBIC Blood Culture results may not be optimal due to an inadequate volume of blood received in culture bottles   Culture   Final    NO GROWTH < 24 HOURS Performed at [facility], [address] [place], [place] [address]    Report Status PENDING  Incomplete  Resp panel by RT-PCR (RSV, Flu  A&B, Covid) Anterior Nasal Swab     Status: None   Collection Time: 04/26/24  6:27 PM   Specimen: Anterior Nasal Swab  Result Value Ref Range Status   SARS Coronavirus 2 by RT PCR NEGATIVE NEGATIVE Final    Comment: (NOTE) SARS-CoV-2 target nucleic acids are NOT DETECTED.  The SARS-CoV-2 RNA is generally detectable in upper respiratory specimens during the acute phase of infection. The lowest concentration of SARS-CoV-2 viral copies this assay can detect is 138 copies/mL. A negative result does not preclude SARS-Cov-2 infection and should not be used as the sole basis for treatment or other patient management decisions. A negative result may occur with  improper specimen collection/handling, submission of specimen other than nasopharyngeal swab, presence of viral mutation(s) within the areas targeted by this assay, and inadequate number of viral copies(<138 copies/mL). A negative result must be combined with clinical observations, patient history, and epidemiological information. The expected result is Negative.  Fact Sheet for Patients:  BloggerCourse.com  Fact Sheet for Healthcare Providers:  SeriousBroker.it  This test is no t yet approved or cleared by the [place]  FDA and  has been authorized for detection and/or diagnosis of SARS-CoV-2 by FDA under an Emergency Use Authorization (EUA). This EUA will remain  in effect (meaning this test can be used) for the duration of the COVID-19 declaration under Section 564(b)(1) of the Act, 21 U.S.C.section 360bbb-3(b)(1), unless the authorization is terminated  or revoked sooner.       Influenza A by PCR NEGATIVE NEGATIVE Final   Influenza B by PCR NEGATIVE NEGATIVE Final    Comment: (NOTE) The Xpert Xpress SARS-CoV-2/FLU/RSV plus assay is intended as an aid in the diagnosis of influenza from Nasopharyngeal swab specimens and should not be used as a sole basis for treatment.  Nasal washings and aspirates are unacceptable for Xpert Xpress SARS-CoV-2/FLU/RSV testing.  Fact Sheet for Patients: BloggerCourse.com  Fact Sheet for Healthcare Providers: SeriousBroker.it  This test is not yet approved or cleared by the [place]  FDA and has been authorized for detection and/or diagnosis of SARS-CoV-2 by FDA under an Emergency Use Authorization (EUA). This EUA will remain in effect (meaning this test can be used) for the duration of the COVID-19 declaration under Section 564(b)(1) of the Act, 21 U.S.C. section 360bbb-3(b)(1), unless the authorization is terminated or revoked.     Resp Syncytial Virus by PCR NEGATIVE NEGATIVE Final    Comment: (NOTE) Fact Sheet for Patients: BloggerCourse.com  Fact Sheet for Healthcare Providers: SeriousBroker.it  This test is not yet approved or cleared by the [place]  FDA and has been authorized for detection and/or diagnosis of SARS-CoV-2 by FDA under an Emergency Use Authorization (EUA). This EUA will remain in effect (meaning this test can be used) for the duration of the COVID-19 declaration under Section 564(b)(1) of the Act, 21 U.S.C. section 360bbb-3(b)(1), unless the authorization is terminated or revoked.  Performed at [facility], [address] [place], [place] [address]   Respiratory (~20 pathogens) panel by PCR     Status: None   Collection Time: 04/26/24  6:27 PM   Specimen: Nasopharyngeal Swab; Respiratory  Result Value Ref Range Status   Adenovirus NOT DETECTED NOT DETECTED Final   Coronavirus 229E NOT DETECTED NOT DETECTED Final    Comment: (NOTE) The Coronavirus on the Respiratory Panel, DOES NOT test for the novel  Coronavirus (2019 nCoV)    Coronavirus HKU1 NOT DETECTED NOT DETECTED Final   Coronavirus NL63 NOT DETECTED NOT DETECTED Final   Coronavirus OC43 NOT DETECTED NOT  DETECTED Final   Metapneumovirus NOT DETECTED NOT DETECTED Final   Rhinovirus / Enterovirus NOT  DETECTED NOT DETECTED Final   Influenza A NOT DETECTED NOT DETECTED Final   Influenza B NOT DETECTED NOT DETECTED Final   Parainfluenza Virus 1 NOT DETECTED NOT DETECTED Final   Parainfluenza Virus 2 NOT DETECTED NOT DETECTED Final   Parainfluenza Virus 3 NOT DETECTED NOT DETECTED Final   Parainfluenza Virus 4 NOT DETECTED NOT DETECTED Final   Respiratory Syncytial Virus NOT DETECTED NOT DETECTED Final   Bordetella pertussis NOT DETECTED NOT DETECTED Final   Bordetella Parapertussis NOT DETECTED NOT DETECTED Final   Chlamydophila pneumoniae NOT DETECTED NOT DETECTED Final   Mycoplasma pneumoniae NOT DETECTED NOT DETECTED Final    Comment: Performed at [facility], [address], [place], [place] [address]  MRSA Next Gen by PCR, Nasal     Status: None   Collection Time: 04/27/24 12:15 PM   Specimen: Nasal Mucosa; Nasal Swab  Result Value Ref Range Status   MRSA by PCR Next Gen NOT DETECTED NOT DETECTED Final    Comment: (NOTE) The GeneXpert MRSA Assay (FDA approved for NASAL specimens only), is one component of a comprehensive MRSA colonization surveillance program. It is not intended to diagnose MRSA infection nor to guide or monitor treatment for MRSA infections. Test performance is not FDA approved in patients less than 75 years old. Performed at [facility], 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 Status post placement of right-sided internal/external biliary drain on 03/16/2024 with placement of 10 French drainage catheter. Some fever and elevated bilirubin today. EXAM: CT ABDOMEN AND PELVIS WITH CONTRAST TECHNIQUE: Multidetector CT imaging of the abdomen and pelvis was  performed using the standard protocol following bolus administration of intravenous contrast. RADIATION DOSE REDUCTION: This exam was performed according to the departmental dose-optimization program which includes automated exposure control, adjustment of the mA and/or kV according to patient size and/or use of iterative reconstruction technique. CONTRAST:  OMNIPAQUE  IOHEXOL  300 MG/ML  SOLN COMPARISON:  04/18/2024 FINDINGS: Lower chest: Mild atelectasis at the posterior right lung base. No pleural effusions. Hepatobiliary: Positioning of a right-sided internal/external biliary drainage catheter is stable entering the right lobe of the liver and extending through the common bile duct and into the duodenum. There is no evidence of biliary obstruction with decompressed right-sided bile ducts and chronic atrophy of the left lobe. The gallbladder is contracted. The previously noted old endoscopic biliary stent has migrated further and is now almost entirely in the duodenum with a short segment remaining in the distal CBD. Pancreas: Unremarkable. No pancreatic ductal dilatation or surrounding inflammatory changes. Spleen: Normal in size without focal abnormality. Adrenals/Urinary Tract: Adrenal glands are unremarkable. Kidneys are normal, without renal calculi, focal lesion, or hydronephrosis. Bladder is unremarkable. Stomach/Bowel: Bowel shows no evidence of obstruction, ileus, inflammation or lesion. The appendix is not discretely visualized. No free intraperitoneal air. Vascular/Lymphatic: Mild atherosclerosis of the abdominal aorta without aneurysm. No lymphadenopathy identified in the abdomen or pelvis. Reproductive: Stable prostate enlargement. Other: No abdominal wall hernia or abnormality. No abdominopelvic ascites. Musculoskeletal: No acute or significant osseous findings. IMPRESSION: 1. Stable positioning of right-sided internal/external biliary drainage catheter. No evidence of biliary obstruction with  decompressed right-sided bile ducts and chronic atrophy of the left lobe. 2. The previously noted old endoscopic biliary stent has migrated further and is now almost entirely in the duodenum with  a short segment remaining in the distal CBD. 3. Mild atherosclerosis of the abdominal aorta without aneurysm. 4. Stable prostate enlargement. Electronically Signed   By: Marcey Moan M.D.   On: 04/27/2024 10:46   US  Abdomen Limited RUQ (LIVER/GB) Result Date: 04/27/2024 CLINICAL DATA:  100030 Leukocytosis 100030 , biliary stenosis/obstruction status post ERCP EXAM: ULTRASOUND ABDOMEN LIMITED RIGHT UPPER QUADRANT COMPARISON:  April 18, 2024 FINDINGS: Gallbladder: Decompressed. No gallstones. Echogenic structure in the gallbladder fundus with dirty shadowing, likely intraluminal gas. No wall thickening or pericholecystic fluid. No sonographic Murphy's sign noted by sonographer. Common bile duct: Diameter: 8 mm.  Biliary duct stent in place. Liver: Normal echogenicity. No focal lesion identified. No intrahepatic biliary ductal dilation. Portal vein is patent on color Doppler imaging with normal direction of blood flow towards the liver. Right Kidney: Partially visualized. No mass. No hydronephrosis or nephrolithiasis. Other: None. IMPRESSION: 1. Decompressed gallbladder with a small amount of gas in the lumen. No cholecystolithiasis or changes of acute cholecystitis. 2. Partially visualized biliary duct stent in place. No intrahepatic biliary ductal dilation. Electronically Signed   By: Rogelia Myers M.D.   On: 04/27/2024 10:01   DG Chest Portable 1 View Result Date: 04/26/2024 CLINICAL DATA:  Fever. EXAM: PORTABLE CHEST 1 VIEW COMPARISON:  12/27/2021. FINDINGS: Low lung volume. Mild diffuse pulmonary vascular congestion, overall less in intensity when compared to the prior exam from 12/27/2021. For there are atelectatic changes at the left lung base. Bilateral lung fields are otherwise clear. No acute consolidation  or lung collapse. Bilateral costophrenic angles are clear. Stable cardio-mediastinal silhouette. No acute osseous abnormalities. The soft tissues are within normal limits. IMPRESSION: *Mild diffuse pulmonary vascular congestion, overall less in intensity when compared to the prior exam from 12/27/2021. No acute consolidation or lung collapse. Electronically Signed   By: Ree Molt M.D.   On: 04/26/2024 16:33   Medications: I have reviewed the patient's current medications. Scheduled Meds:  aspirin  EC  81 mg Oral Daily   enoxaparin  (LOVENOX ) injection  40 mg Subcutaneous Q24H   insulin  aspart  0-15 Units Subcutaneous TID WC   insulin  aspart  0-5 Units Subcutaneous QHS   Continuous Infusions:  [START ON 04/28/2024] cefTRIAXone  (ROCEPHIN )  IV     ertapenem  Stopped (04/27/24 1138)   vancomycin  Stopped (04/27/24 1313)   PRN Meds:.acetaminophen  **OR** acetaminophen , alum & mag hydroxide-simeth, furosemide , ondansetron  **OR** ondansetron  (ZOFRAN ) IV, oxyCODONE , senna   Assessment: Principal Problem:   Fever Active Problems:   Biliary obstruction   Leukocytosis    Plan: This patient has a right sided percutaneous stent into the biliary tree on the right side of the liver with good drainage and the stent placed by me previously has migrated with almost all of the stent out of the bile duct with most of the stents in the duodenum.  The patient was offered an upper endoscopy to remove that stent since it is likely not doing any thing to help with drainage and can be a source of infection.  The patient states that he is not sure he wants to go through that and would like to think about it.  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  If the patient has the drain changed and the stent is pushed out into the duodenum it is likely  that it will pass naturally since  the patient does not consent to having the stent removed endoscopically at this time.  I have discussed this with IR and the hospitalist.   LOS: 1 day   Rogelia Copping, MD.FACG 04/27/2024, 2:38 PM Pager [phone] 7am-5pm  Check AMION for 5pm -7am coverage and on weekends

## 2024-04-27 NOTE — Assessment & Plan Note (Signed)
 No chest pain. -Continue home aspirin  -Beta-blocker and spironolactone  was held on previous admission

## 2024-04-27 NOTE — Assessment & Plan Note (Signed)
 Patient initially met sepsis criteria with fever and leukocytosis.  Recent admission secondary to ESBL E. coli bacteremia for which patient was on ertapenem  till 05/01/2024.  Repeat blood cultures negative in 24-hour. Leukocytosis improving. No obvious infection at this time. -Continue with ertapenem  - Continue with supportive care

## 2024-04-28 ENCOUNTER — Inpatient Hospital Stay: Admitting: Radiology

## 2024-04-28 ENCOUNTER — Encounter: Payer: Self-pay | Admitting: Emergency Medicine

## 2024-04-28 DIAGNOSIS — I2510 Atherosclerotic heart disease of native coronary artery without angina pectoris: Secondary | ICD-10-CM | POA: Diagnosis not present

## 2024-04-28 DIAGNOSIS — K831 Obstruction of bile duct: Secondary | ICD-10-CM | POA: Diagnosis not present

## 2024-04-28 DIAGNOSIS — E119 Type 2 diabetes mellitus without complications: Secondary | ICD-10-CM | POA: Diagnosis not present

## 2024-04-28 DIAGNOSIS — A4151 Sepsis due to Escherichia coli [E. coli]: Secondary | ICD-10-CM | POA: Diagnosis not present

## 2024-04-28 HISTORY — PX: IR EXCHANGE BILIARY DRAIN: IMG6046

## 2024-04-28 LAB — CBC
HCT: 32.8 % — ABNORMAL LOW (ref 39.0–52.0)
Hemoglobin: 11 g/dL — ABNORMAL LOW (ref 13.0–17.0)
MCH: 31.2 pg (ref 26.0–34.0)
MCHC: 33.5 g/dL (ref 30.0–36.0)
MCV: 92.9 fL (ref 80.0–100.0)
Platelets: 335 K/uL (ref 150–400)
RBC: 3.53 MIL/uL — ABNORMAL LOW (ref 4.22–5.81)
RDW: 15.7 % — ABNORMAL HIGH (ref 11.5–15.5)
WBC: 10.3 K/uL (ref 4.0–10.5)
nRBC: 0 % (ref 0.0–0.2)

## 2024-04-28 LAB — COMPREHENSIVE METABOLIC PANEL WITH GFR
ALT: 27 U/L (ref 0–44)
AST: 18 U/L (ref 15–41)
Albumin: 2.4 g/dL — ABNORMAL LOW (ref 3.5–5.0)
Alkaline Phosphatase: 123 U/L (ref 38–126)
Anion gap: 7 (ref 5–15)
BUN: 13 mg/dL (ref 8–23)
CO2: 26 mmol/L (ref 22–32)
Calcium: 8 mg/dL — ABNORMAL LOW (ref 8.9–10.3)
Chloride: 104 mmol/L (ref 98–111)
Creatinine, Ser: 0.85 mg/dL (ref 0.61–1.24)
GFR, Estimated: >60 mL/min (ref >60)
Glucose, Bld: 104 mg/dL — ABNORMAL HIGH (ref 70–99)
Potassium: 3.5 mmol/L (ref 3.5–5.1)
Sodium: 137 mmol/L (ref 135–145)
Total Bilirubin: 1 mg/dL (ref 0.0–1.2)
Total Protein: 5.7 g/dL — ABNORMAL LOW (ref 6.5–8.1)

## 2024-04-28 LAB — GLUCOSE, CAPILLARY
Glucose-Capillary: 112 mg/dL — ABNORMAL HIGH (ref 70–99)
Glucose-Capillary: 113 mg/dL — ABNORMAL HIGH (ref 70–99)

## 2024-04-28 MED ORDER — LIDOCAINE HCL 1 % IJ SOLN
20.0000 mL | Freq: Once | INTRAMUSCULAR | Status: AC
  Administered 2024-04-28: 10 mL via INTRADERMAL
  Filled 2024-04-28: qty 20

## 2024-04-28 MED ORDER — SODIUM CHLORIDE 0.9 % IV SOLN
INTRAVENOUS | Status: AC | PRN
  Administered 2024-04-28: 2 g via INTRAVENOUS

## 2024-04-28 MED ORDER — LIDOCAINE HCL 1 % IJ SOLN
INTRAMUSCULAR | Status: AC
Start: 2024-04-28 — End: 2024-04-28
  Filled 2024-04-28: qty 20

## 2024-04-28 MED ORDER — FENTANYL CITRATE (PF) 100 MCG/2ML IJ SOLN
INTRAMUSCULAR | Status: AC
  Filled 2024-04-28: qty 2

## 2024-04-28 MED ORDER — ERTAPENEM IV (FOR PTA / DISCHARGE USE ONLY): 1.0000 g | INTRAVENOUS | 0 refills | Status: DC

## 2024-04-28 MED ORDER — FENTANYL CITRATE (PF) 100 MCG/2ML IJ SOLN
INTRAMUSCULAR | Status: AC | PRN
  Administered 2024-04-28 (×2): 50 ug via INTRAVENOUS

## 2024-04-28 MED ORDER — IOHEXOL 300 MG/ML  SOLN
10.0000 mL | Freq: Once | INTRAMUSCULAR | Status: AC | PRN
  Administered 2024-04-28: 12 mL

## 2024-04-28 MED ORDER — SODIUM CHLORIDE 0.9 % IV SOLN: INTRAVENOUS | Status: DC

## 2024-04-28 MED ORDER — MIDAZOLAM HCL 5 MG/5ML IJ SOLN
INTRAMUSCULAR | Status: AC | PRN
Start: 2024-04-28 — End: 2024-04-28
  Administered 2024-04-28 (×2): 1 mg via INTRAVENOUS

## 2024-04-28 MED ORDER — MIDAZOLAM HCL 2 MG/2ML IJ SOLN
INTRAMUSCULAR | Status: AC
  Filled 2024-04-28: qty 2

## 2024-04-28 NOTE — Progress Notes (Signed)
 Patient admitted to the hospital multiple times-sepsis cholangitis.  Unable to keep up appointment with surgery-outpatient.  Discussed with the patient and wife-based on PET scan and cancer markers concerning for cancer however would need-histologic diagnosis cytology so far x 3 negative.  Again explained my inability to offer any chemo/radiation options if patient without a clear diagnosis/and with multiple infections.  They are interested in getting a second opinion at [facility]; which I think is very reasonable.  They have reached out to PCP for referral.  I did inform that I would be happy to help/arrange a referral.  They will contact our office if needed.   Discuss with [employer].   GB

## 2024-04-28 NOTE — Progress Notes (Signed)
 Patient clinically stable post IR biliary tube upsize with brushing per Dr Hughes, tolerated well. Vitals stable post procedure. Received Versed  2 mg along with Fentanyl  100 mcg IV for procedure. Report given to Alyson Patterson Rn post procedure/specials./18/

## 2024-04-28 NOTE — Consult Note (Signed)
 Pharmacy Antibiotic Note  Casey Peters is a 78 y.o. male admitted on 04/26/2024 with fever and multifocal pneumonia.  Patient was discharged on ertapenem  1 g daily for 6 days due to E. coli bacteremia and biliary obstruction.  With a plan to pull the midline on 05/02/2024. Pharmacy has been consulted for Vancomycin  dosing. Will continue Ertapenem  1g Q24 hours, as well, while admitted.  IR scheduled biliary drain exchange with bile duct brush biopsy for today 7/18. No ID consult this admission yet. Therapy discussed with ID PhamD. Will continue Vanc and ertapenem  for now until more info from cultures.  Scr remains stable at 0.85 (baseline 0.9)  Plan: Continue Vancomycin  1000 mg IV Q 12 hrs. Goal AUC 400-550. Expected AUC: 515.4 Expected Cmin: 15.0 SCr used: 0.9, Vd used: 0.72  Height: 5' 11 (180.3 cm) Weight: 81.5 kg (179 lb 9.6 oz) IBW/kg (Calculated) : 75.3  Temp (24hrs), Avg:98.9 F (37.2 C), Min:98 F (36.7 C), Max:99.4 F (37.4 C)  Recent Labs  Lab 04/23/24 0249 04/24/24 0707 04/26/24 1602 04/27/24 0426 04/28/24 0527  WBC 7.1 8.2 17.4* 13.2* 10.3  CREATININE 1.11 0.95 1.10 0.90 0.85  LATICACIDVEN  --   --  1.7  --   --     Estimated Creatinine Clearance: 78.7 mL/min (by C-G formula based on SCr of 0.85 mg/dL).    Allergies  Allergen Reactions   Citalopram Nausea And Vomiting    Dizziness, unsteady, balanced issues   Sulfamethoxazole -Trimethoprim      Fatigue and muscle ache Other reaction(s): Other (See Comments) Fatigue and muscle ache   Trimethoprim  Other (See Comments)    Fatigue, hypotension    Antimicrobials this admission: Vancomycin  7/16 >>  Ertapenem  7/16 >>  Cefepime  7/16 x 1  Dose adjustments this admission: N/A  Microbiology results: 7/16 BCx: collected 7/17 MRSA PCR: ordered  Yamil Dougher Rodriguez-Guzman PharmD, BCPS 04/28/2024 10:52 AM

## 2024-04-28 NOTE — Care Management Important Message (Signed)
 Important Message  Patient Details  Name: Casey Peters MRN: [id number] Date of Birth: 1945-12-07   Important Message Given:  Yes - Medicare IM     Rojelio SHAUNNA Rattler 04/28/2024, 12:06 PM

## 2024-04-28 NOTE — Discharge Summary (Signed)
 Physician Discharge Summary   Patient: Casey Peters MRN: [id number] DOB: 26-May-1946  Admit date:     04/26/2024  Discharge date: 04/28/24  Discharge Physician: Amaryllis Dare   PCP: Rudolpho Norleen BIRCH, MD   Recommendations at discharge:  Please obtain CBC and CMP on follow-up Please follow-up on breast biopsy results which are done on 04/28/2024 with drain exchange by IR Follow-up with oncology Follow-up with primary care provider Follow-up with infectious disease  Discharge Diagnoses: Principal Problem:   Sepsis [facility]) Active Problems:   Fever   Biliary obstruction   Type 2 diabetes mellitus (HCC)   CAD S/P percutaneous coronary angioplasty   Cardiomegaly   Leukocytosis   Bile duct obstruction   Hospital Course: Taken from H&P.  AHARON CARRIERE is a 78 y.o. 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 coli bacteremia with percutaneous biliary drain in place for which he was hospitalized from 04/18/2024 to 04/24/2024 and discharged on IV ertapenem , presented to ED with complaints of not feeling well and fever.  Temperature at home reportedly 101.6, he was given Tylenol  and 500 cc bolus en route via EMS per ED triage note.   On presentation hemodynamically stable and afebrile, labs with worsening leukocytosis at 17, alkaline phosphatase elevated at 177, it was 106 previously. UA negative for UTI, respiratory panel is negative.  Chest x-ray unremarkable.  Blood cultures were redrawn and patient was continued on ertapenem .  7/17: Hemodynamically stable, some improvement in alkaline phosphatase and T. bili at 1.6.  Right upper quadrant ultrasound with decompressed gallbladder and a small amount of gas in the lumen, no cholelithiasis or changes of acute cholecystitis.  CT abdomen with contrast  with stable positioning of right sided internal/external biliary drainage catheter.  No evidence of biliary obstruction with decompressed right-sided bile ducts and chronic atrophy of left lobe.  The biliary stent has migrated further and is now almost entirely in the duodenum with a short segment remaining in the distal CBD.  IR is going to exchange the percutaneous drain tomorrow.  They were requested to obtain some brushings for biopsy as there is still no definitive diagnosis.  7/18: Remained hemodynamically stable with improvement in alkaline phosphatase and T. bili s/p drain exchange and brush biopsy of the stricture site by IR today.  Message sent to his oncologist for follow-up on pathology results.  There is still no definitive diagnosis but concern remained high for underlying malignancy.  Patient tolerated the procedure well and wants to go home.  He will resume his home ertapenem  to complete his antibiotic course for prior ESBL E. coli bacteremia.  Patient will continue on his home medications and follow-up with his providers for further assistance.  Assessment and Plan: * Sepsis [facility]) Patient initially met sepsis criteria with fever and leukocytosis.  Recent admission secondary to ESBL E. coli bacteremia for which patient was on ertapenem  till 05/01/2024.  Repeat blood cultures negative  Leukocytosis improving. No obvious infection at this time. -Continue with ertapenem  - Continue with supportive care  Biliary obstruction S/p ERCP with stent placement and percutaneous gallbladder drain placement.  IR was consulted and they are going to do percutaneous drain exchange. They were requested to obtain some brush biopsies during exchange as final diagnosis with concerning PET remains unclear. Elevated alkaline phosphatase and T. bili on admission which now improved  Drain was exchanged by IR along with a brush biopsy of the stricture site-pathology pending  Type 2 diabetes mellitus  (HCC) Seems well-controlled with most recent A1c of 6.9. Uses Amaryl and Januvia at home -SSI  CAD S/P percutaneous coronary angioplasty No chest pain. -Continue home aspirin  -Beta-blocker and spironolactone  was held on previous admission  Cardiomegaly Patient with history of chronic HFpEF.  Clinically appears euvolemic. - Continue with Lasix  -Monitor volume status  Consultants: GI.  IR Procedures performed: Cholecystostomy drain exchange by IR Disposition: Home Diet recommendation:  Discharge Diet Orders (From admission, onward)     Start     Ordered   04/28/24 0000  Diet - low sodium heart healthy        04/28/24 1425           Cardiac and Carb modified diet DISCHARGE MEDICATION: Allergies as of 04/28/2024       Reactions   Citalopram Nausea And Vomiting   Dizziness, unsteady, balanced issues   Sulfamethoxazole -trimethoprim     Fatigue and muscle ache Other reaction(s): Other (See Comments) Fatigue and muscle ache   Trimethoprim  Other (See Comments)   Fatigue, hypotension        Medication List     TAKE these medications    aspirin  EC 81 MG tablet Take 81 mg by mouth daily.   carvedilol  6.25 MG tablet Commonly known as: COREG  Take 6.25 mg by mouth 2 (two) times daily with a meal.   ertapenem  IVPB Commonly known as: INVANZ  Inject 1 g into the vein daily for 2 days. Indication:  E coli bacteremia and biliary obstruction First Dose: Yes Last Day of Therapy:  05/01/2024 Labs - Once weekly:  CBC/D and CMP Fax weekly lab results  promptly to [phone] Method of administration: Mini-Bag Plus / Gravity Method of administration may be changed at the discretion of home infusion pharmacist based upon assessment  of the patient and/or caregiver's ability to self-administer the medication ordered. Please pull midline at completion of IV antibiotics on 05/02/24 Call [phone] with any critical values or questions Start taking on: April 29, 2024    furosemide  40 MG tablet Commonly known as: LASIX  Take 40 mg by mouth daily as needed.   Geri-Lanta 200-200-20 MG/5ML suspension Generic drug: alum & mag hydroxide-simeth Take 15 mLs by mouth every 6 (six) hours as needed for indigestion or heartburn.   glimepiride 2 MG tablet Commonly known as: AMARYL Take 2 mg by mouth daily as needed.   Januvia 50 MG tablet Generic drug: sitaGLIPtin Take 1 tablet by mouth daily.   ondansetron  8 MG tablet Commonly known as: ZOFRAN  One pill every 8 hours as needed for nausea/vomitting.   oxyCODONE  5 MG immediate release tablet Commonly known as: Oxy IR/ROXICODONE  Take 1 tablet (5 mg total) by mouth every 8 (eight) hours as needed for severe pain (pain score 7-10).   pantoprazole  40 MG tablet Commonly known as: PROTONIX  Take 1 tablet (40 mg total) by mouth 2 (two) times daily for 30 days, THEN 1 tablet (40 mg total) daily. Start taking on: April 24, 2024   senna 8.6 MG Tabs tablet Commonly known as: SENOKOT Take 1 tablet (8.6 mg total) by mouth daily as needed for mild constipation.   silodosin  8 MG Caps capsule Commonly known as: RAPAFLO  Take 1 capsule (8 mg total) by mouth daily with breakfast.   spironolactone  25 MG tablet Commonly known as: ALDACTONE  Take 12.5 mg by mouth daily.   testosterone  50 MG/5GM (1%) Gel  Commonly known as: ANDROGEL  Place 5 g onto the skin daily. Please confirm with your cardiologist before resuming   Vitamin D  (Ergocalciferol ) 1.25 MG (50000 UNIT) Caps capsule Commonly known as: DRISDOL  Take 1 capsule (50,000 Units total) by mouth every 7 (seven) days.               Discharge Care Instructions  (From admission, onward)           Start     Ordered   04/28/24 0000  Discharge wound care:       Comments: Reinforce dressing around drain as needed   04/28/24 1425            Follow-up Information     Rudolpho Norleen BIRCH, MD. Schedule an appointment as soon as possible for a visit in 1  week(s).   Specialty: Internal Medicine Contact information: [address] [facility] [place] [address] [phone]         Rennie Cindy SAUNDERS, MD. Schedule an appointment as soon as possible for a visit in 1 week(s).   Specialties: Internal Medicine, Oncology Contact information: [address] [place] [address] [phone]                Discharge Exam: Fredricka Weights   04/26/24 1558  Weight: 81.5 kg   General.  Well-developed gentleman, in no acute distress. Pulmonary.  Lungs clear bilaterally, normal respiratory effort. CV.  Regular rate and rhythm, no JVD, rub or murmur. Abdomen.  Soft, nontender, nondistended, BS positive. CNS.  Alert and oriented .  No focal neurologic deficit. Extremities.  No edema, no cyanosis, pulses intact and symmetrical. Psychiatry.  Judgment and insight appears normal.   Condition at discharge: stable  The results of significant diagnostics from this hospitalization (including imaging, microbiology, ancillary and laboratory) are listed below for reference.   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 Status post placement of right-sided internal/external biliary drain on 03/16/2024 with placement of 10 French drainage catheter. Some fever and elevated bilirubin today. EXAM: CT ABDOMEN AND PELVIS WITH CONTRAST TECHNIQUE: Multidetector CT imaging of the abdomen and pelvis was performed using the standard protocol following bolus administration of intravenous contrast. RADIATION DOSE REDUCTION: This exam was performed according to the departmental dose-optimization program which includes automated exposure control, adjustment of the mA and/or kV according to patient size and/or use of iterative reconstruction technique. CONTRAST:  OMNIPAQUE  IOHEXOL  300 MG/ML  SOLN  COMPARISON:  04/18/2024 FINDINGS: Lower chest: Mild atelectasis at the posterior right lung base. No pleural effusions. Hepatobiliary: Positioning of a right-sided internal/external biliary drainage catheter is stable entering the right lobe of the liver and extending through the common bile duct and into the duodenum. There is no evidence of biliary obstruction with decompressed right-sided bile ducts and chronic atrophy of the left lobe. The gallbladder is contracted. The previously noted old endoscopic biliary stent has migrated further and is now almost entirely in the duodenum with a short segment remaining in the distal CBD. Pancreas: Unremarkable. No pancreatic ductal dilatation or surrounding inflammatory changes. Spleen: Normal in size without focal abnormality. Adrenals/Urinary Tract: Adrenal glands are unremarkable. Kidneys are normal, without renal calculi, focal lesion, or hydronephrosis. Bladder is unremarkable. Stomach/Bowel: Bowel shows no evidence of obstruction, ileus, inflammation or lesion. The appendix is not discretely visualized. No free intraperitoneal air. Vascular/Lymphatic: Mild atherosclerosis of the abdominal  aorta without aneurysm. No lymphadenopathy identified in the abdomen or pelvis. Reproductive: Stable prostate enlargement. Other: No abdominal wall hernia or abnormality. No abdominopelvic ascites. Musculoskeletal: No acute or significant osseous findings. IMPRESSION: 1. Stable positioning of right-sided internal/external biliary drainage catheter. No evidence of biliary obstruction with decompressed right-sided bile ducts and chronic atrophy of the left lobe. 2. The previously noted old endoscopic biliary stent has migrated further and is now almost entirely in the duodenum with a short segment remaining in the distal CBD. 3. Mild atherosclerosis of the abdominal aorta without aneurysm. 4. Stable prostate enlargement. Electronically Signed   By: Marcey Moan M.D.   On:  04/27/2024 10:46   US  Abdomen Limited RUQ (LIVER/GB) Result Date: 04/27/2024 CLINICAL DATA:  100030 Leukocytosis 100030 , biliary stenosis/obstruction status post ERCP EXAM: ULTRASOUND ABDOMEN LIMITED RIGHT UPPER QUADRANT COMPARISON:  April 18, 2024 FINDINGS: Gallbladder: Decompressed. No gallstones. Echogenic structure in the gallbladder fundus with dirty shadowing, likely intraluminal gas. No wall thickening or pericholecystic fluid. No sonographic Murphy's sign noted by sonographer. Common bile duct: Diameter: 8 mm.  Biliary duct stent in place. Liver: Normal echogenicity. No focal lesion identified. No intrahepatic biliary ductal dilation. Portal vein is patent on color Doppler imaging with normal direction of blood flow towards the liver. Right Kidney: Partially visualized. No mass. No hydronephrosis or nephrolithiasis. Other: None. IMPRESSION: 1. Decompressed gallbladder with a small amount of gas in the lumen. No cholecystolithiasis or changes of acute cholecystitis. 2. Partially visualized biliary duct stent in place. No intrahepatic biliary ductal dilation. Electronically Signed   By: Rogelia Myers M.D.   On: 04/27/2024 10:01   DG Chest Portable 1 View Result Date: 04/26/2024 CLINICAL DATA:  Fever. EXAM: PORTABLE CHEST 1 VIEW COMPARISON:  12/27/2021. FINDINGS: Low lung volume. Mild diffuse pulmonary vascular congestion, overall less in intensity when compared to the prior exam from 12/27/2021. For there are atelectatic changes at the left lung base. Bilateral lung fields are otherwise clear. No acute consolidation or lung collapse. Bilateral costophrenic angles are clear. Stable cardio-mediastinal silhouette. No acute osseous abnormalities. The soft tissues are within normal limits. IMPRESSION: *Mild diffuse pulmonary vascular congestion, overall less in intensity when compared to the prior exam from 12/27/2021. No acute consolidation or lung collapse. Electronically Signed   By: Ree Molt M.D.    On: 04/26/2024 16:33   US  EKG SITE RITE Result Date: 04/24/2024 If Site Rite image not attached, placement could not be confirmed due to current cardiac rhythm.  ECHOCARDIOGRAM COMPLETE Result Date: 04/18/2024    ECHOCARDIOGRAM REPORT   Patient Name:   ASBURY HAIR Date of Exam: 04/18/2024 Medical Rec #:  [id number]            Height:       71.0 in Accession #:    [id number]           Weight:       187.0 lb Date of Birth:  1945/10/18            BSA:          2.049 m Patient Age:    78 years             BP:           86/53 mmHg Patient Gender: M                    HR:           99 bpm. Exam Location:  [facility]  Procedure: 2D Echo, Color Doppler, Cardiac Doppler and Intracardiac            Opacification Agent (Both Spectral and Color Flow Doppler were            utilized during procedure). Indications:     CHF-Acute Systolic I50.21  History:         Patient has prior history of Echocardiogram examinations, most                  recent 12/28/2021. CHF.  Sonographer:     Ashley McNeely-Sloane Referring Phys:  [id number] ELVAN SOR Diagnosing Phys: Dwayne D Callwood MD IMPRESSIONS  1. Left ventricular ejection fraction, by estimation, is 50 to 55%. The left ventricle has low normal function. The left ventricle has no regional wall motion abnormalities. Left ventricular diastolic parameters are consistent with Grade I diastolic dysfunction (impaired relaxation).  2. Right ventricular systolic function is normal. The right ventricular size is normal.  3. The mitral valve is normal in structure. Trivial mitral valve regurgitation.  4. The aortic valve is normal in structure. Aortic valve regurgitation is not visualized. FINDINGS  Left Ventricle: Left ventricular ejection fraction, by estimation, is 50 to 55%. The left ventricle has low normal function. The left ventricle has no regional wall motion abnormalities. Definity  contrast agent was given IV to delineate the left ventricular endocardial borders. Strain was  performed and the global longitudinal strain is indeterminate. Global longitudinal strain performed but not reported based on interpreter judgement due to suboptimal tracking. The left ventricular internal cavity size was normal in size. There is borderline left ventricular hypertrophy. Left ventricular diastolic parameters are consistent with Grade I diastolic dysfunction (impaired relaxation). Right Ventricle: The right ventricular size is normal. No increase in right ventricular wall thickness. Right ventricular systolic function is normal. Left Atrium: Left atrial size was normal in size. Right Atrium: Right atrial size was normal in size. Pericardium: There is no evidence of pericardial effusion. Mitral Valve: The mitral valve is normal in structure. Trivial mitral valve regurgitation. MV peak gradient, 3.1 mmHg. The mean mitral valve gradient is 2.0 mmHg. Tricuspid Valve: The tricuspid valve is normal in structure. Tricuspid valve regurgitation is mild. Aortic Valve: The aortic valve is normal in structure. Aortic valve regurgitation is not visualized. Aortic valve mean gradient measures 2.0 mmHg. Aortic valve peak gradient measures 3.4 mmHg. Aortic valve area, by VTI measures 4.02 cm. Pulmonic Valve: The pulmonic valve was normal in structure. Pulmonic valve regurgitation is not visualized. Aorta: The ascending aorta was not well visualized. IAS/Shunts: No atrial level shunt detected by color flow Doppler. Additional Comments: 3D was performed not requiring image post processing on an independent workstation and was indeterminate.  LEFT VENTRICLE PLAX 2D LVIDd:         5.10 cm     Diastology LVIDs:         3.13 cm     LV e' medial:    7.18 cm/s LV PW:         1.10 cm     LV E/e' medial:  10.8 LV IVS:        1.10 cm     LV e' lateral:   9.46 cm/s LVOT diam:     2.30 cm     LV E/e' lateral: 8.2 LV SV:         60 LV SV Index:   29 LVOT Area:     4.15 cm  LV Volumes (MOD) LV vol d,  MOD A2C: 98.3 ml LV vol d,  MOD A4C: 81.0 ml LV vol s, MOD A2C: 44.8 ml LV vol s, MOD A4C: 61.3 ml LV SV MOD A2C:     53.5 ml LV SV MOD A4C:     81.0 ml LV SV MOD BP:      41.5 ml RIGHT VENTRICLE RV Basal diam:  3.90 cm RV Mid diam:    3.20 cm RV S prime:     10.30 cm/s TAPSE (M-mode): 1.8 cm LEFT ATRIUM             Index        RIGHT ATRIUM           Index LA diam:        3.60 cm 1.76 cm/m   RA Area:     18.20 cm LA Vol (A2C):   42.2 ml 20.59 ml/m  RA Volume:   50.70 ml  24.74 ml/m LA Vol (A4C):   34.5 ml 16.84 ml/m LA Biplane Vol: 39.8 ml 19.42 ml/m  AORTIC VALVE                    PULMONIC VALVE AV Area (Vmax):    3.64 cm     PV Vmax:        0.87 m/s AV Area (Vmean):   3.53 cm     PV Vmean:       62.600 cm/s AV Area (VTI):     4.02 cm     PV VTI:         0.155 m AV Vmax:           92.00 cm/s   PV Peak grad:   3.1 mmHg AV Vmean:          64.200 cm/s  PV Mean grad:   2.0 mmHg AV VTI:            0.150 m      RVOT Peak grad: 2 mmHg AV Peak Grad:      3.4 mmHg AV Mean Grad:      2.0 mmHg LVOT Vmax:         80.70 cm/s LVOT Vmean:        54.500 cm/s LVOT VTI:          0.145 m LVOT/AV VTI ratio: 0.97  AORTA Ao Root diam: 3.60 cm Ao Asc diam:  3.10 cm MITRAL VALVE MV Area (PHT): 5.54 cm     SHUNTS MV Area VTI:   2.63 cm     Systemic VTI:  0.14 m MV Peak grad:  3.1 mmHg     Systemic Diam: 2.30 cm MV Mean grad:  2.0 mmHg     Pulmonic VTI:  0.108 m MV Vmax:       0.88 m/s MV Vmean:      61.5 cm/s MV Decel Time: 137 msec MV E velocity: 77.60 cm/s MV A velocity: 105.00 cm/s MV E/A ratio:  0.74 Dwayne D Callwood MD Electronically signed by Cara JONETTA Lovelace MD Signature Date/Time: 04/18/2024/10:22:11 PM    Final    US  RENAL Result Date: 04/18/2024 CLINICAL DATA:  409830 AKI (acute kidney injury) (HCC) [id number] EXAM: RENAL / URINARY TRACT ULTRASOUND COMPLETE COMPARISON:  Mar 10, 2024 FINDINGS: Right Kidney: Renal measurements: 13.1 x 6.3 x 10.3 cm = volume: 273 mL. Normal echogenicity. 1.2 x 0.8 x 0.9 cm cyst in the upper pole. No hydronephrosis  or nephrolithiasis. Left Kidney: Renal measurements: 12 x 5.4 x  5.3 cm = volume: 181 mL. Normal echogenicity. A couple of small cysts in the left kidney measuring up to 1.6 x 1.4 x 1.6 cm. No hydronephrosis or nephrolithiasis. Bladder: Circumferential wall thickening with trabeculation. Abnormal postvoid residual of 121 mL. Other: None. IMPRESSION: 1. No hydronephrosis or nephrolithiasis. 2. Circumferential wall thickening of the urinary bladder, which in the setting of prostatomegaly, may represent changes of chronic bladder outlet obstruction. If there is concern for acute cystitis, correlation with urinalysis would be recommended. 3. Abnormal postvoid residual of 121 mL. Electronically Signed   By: Rogelia Myers M.D.   On: 04/18/2024 16:00   CT Angio Chest PE W/Cm &/Or Wo Cm Result Date: 04/18/2024 CLINICAL DATA:  Pulmonary embolus suspected with high probability. Fever developed after biliary procedure today. EXAM: CT ANGIOGRAPHY CHEST CT ABDOMEN AND PELVIS WITH CONTRAST TECHNIQUE: Multidetector CT imaging of the chest was performed using the standard protocol during bolus administration of intravenous contrast. Multiplanar CT image reconstructions and MIPs were obtained to evaluate the vascular anatomy. Multidetector CT imaging of the abdomen and pelvis was performed using the standard protocol during bolus administration of intravenous contrast. RADIATION DOSE REDUCTION: This exam was performed according to the departmental dose-optimization program which includes automated exposure control, adjustment of the mA and/or kV according to patient size and/or use of iterative reconstruction technique. CONTRAST:  OMNIPAQUE  IOHEXOL  350 MG/ML SOLN COMPARISON:  PET CT 04/12/2024.  CT chest 08/14/2020 FINDINGS: CTA CHEST FINDINGS Cardiovascular: Technically adequate study with good opacification of the central and segmental pulmonary arteries. Moderate motion artifact. No focal filling defects are  demonstrated. No evidence of significant pulmonary embolus. Normal heart size. No pericardial effusions. Normal caliber thoracic aorta. No dissection. Great vessel origins are patent. Mediastinum/Nodes: No enlarged mediastinal, hilar, or axillary lymph nodes. Thyroid gland, trachea, and esophagus demonstrate no significant findings. Lungs/Pleura: Patchy peripheral infiltrates in the lungs may represent scarring or multifocal pneumonia. Similar appearance to previous study. No pleural effusion or pneumothorax. 7 mm diameter nodule in the right middle lung, series 3, image 70. Additional smaller nodule seen in both lungs. Musculoskeletal: Degenerative changes in the spine. No acute bony abnormalities. Review of the MIP images confirms the above findings. CT ABDOMEN and PELVIS FINDINGS Hepatobiliary: Residual contrast material demonstrated within mildly dilated intrahepatic bile ducts in the left lobe. A percutaneous biliary drainage catheter is present with tip in the duodenum. No focal liver lesions are seen. Extrahepatic bile ducts are not dilated. Gas in the gallbladder is likely result of procedure. Pancreas: Unremarkable. No pancreatic ductal dilatation or surrounding inflammatory changes. Spleen: Normal in size without focal abnormality. Adrenals/Urinary Tract: Adrenal glands are unremarkable. Kidneys are normal, without renal calculi, focal lesion, or hydronephrosis. Bladder is unremarkable. Stomach/Bowel: Stomach, small bowel, and colon are not abnormally distended. No wall thickening or inflammatory changes. Appendix is not identified. Vascular/Lymphatic: Aortic atherosclerosis. No enlarged abdominal or pelvic lymph nodes. Reproductive: Prostate gland is enlarged Other: No free air or free fluid in the abdomen. No loculated collections are identified. Abdominal wall musculature appears intact. Musculoskeletal: Degenerative changes in the spine. No acute bony abnormalities. Review of the MIP images confirms  the above findings. IMPRESSION: 1. Postprocedural changes in the liver with percutaneous biliary drain in place. Tip is in the duodenum. Residual contrast material within mildly dilated left intrahepatic bile ducts. Gas in the gallbladder is also likely postprocedural. 2. No abscess or free air in the abdomen. 3. No evidence of significant pulmonary embolus. 4. Patchy peripheral infiltrates in the  lungs possibly representing multifocal pneumonia or scarring. 5. Scattered nodules in the lungs, largest in the right middle lung measuring 7 mm diameter. Non-contrast chest CT at 3-6 months is recommended. If the nodules are stable at time of repeat CT, then future CT at 18-24 months (from today's scan) is considered optional for low-risk patients, but is recommended for high-risk patients. This recommendation follows the consensus statement: Guidelines for Management of Incidental Pulmonary Nodules Detected on CT Images: From the Fleischner Society 2017; Radiology 2017; 284:228-243. 6. Aortic atherosclerosis. 7. 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 Electronically Signed   By: Elsie Gravely M.D.   On: 04/18/2024 02:13   CT ABDOMEN PELVIS W CONTRAST Result Date: 04/18/2024 CLINICAL DATA:  Pulmonary embolus suspected with high probability. Fever developed after biliary procedure today. EXAM: CT ANGIOGRAPHY CHEST CT ABDOMEN AND PELVIS WITH CONTRAST TECHNIQUE: Multidetector CT imaging of the chest was performed using the standard protocol during bolus administration of intravenous contrast. Multiplanar CT image reconstructions and MIPs were obtained to evaluate the vascular anatomy. Multidetector CT imaging of the abdomen and pelvis was performed using the standard protocol during bolus administration of intravenous contrast. RADIATION DOSE REDUCTION: This exam was performed according to the departmental dose-optimization program which includes automated exposure control, adjustment of the mA and/or kV according to patient size  and/or use of iterative reconstruction technique. CONTRAST:  OMNIPAQUE  IOHEXOL  350 MG/ML SOLN COMPARISON:  PET CT 04/12/2024.  CT chest 08/14/2020 FINDINGS: CTA CHEST FINDINGS Cardiovascular: Technically adequate study with good opacification of the central and segmental pulmonary arteries. Moderate motion artifact. No focal filling defects are demonstrated. No evidence of significant pulmonary embolus. Normal heart size. No pericardial effusions. Normal caliber thoracic aorta. No dissection. Great vessel origins are patent. Mediastinum/Nodes: No enlarged mediastinal, hilar, or axillary lymph nodes. Thyroid gland, trachea, and esophagus demonstrate no significant findings. Lungs/Pleura: Patchy peripheral infiltrates in the lungs may represent scarring or multifocal pneumonia. Similar appearance to previous study. No pleural effusion or pneumothorax. 7 mm diameter nodule in the right middle lung, series 3, image 70. Additional smaller nodule seen in both lungs. Musculoskeletal: Degenerative changes in the spine. No acute bony abnormalities. Review of the MIP images confirms the above findings. CT ABDOMEN and PELVIS FINDINGS Hepatobiliary: Residual contrast material demonstrated within mildly dilated intrahepatic bile ducts in the left lobe. A percutaneous biliary drainage catheter is present with tip in the duodenum. No focal liver lesions are seen. Extrahepatic bile ducts are not dilated. Gas in the gallbladder is likely result of procedure. Pancreas: Unremarkable. No pancreatic ductal dilatation or surrounding inflammatory changes. Spleen: Normal in size without focal abnormality. Adrenals/Urinary Tract: Adrenal glands are unremarkable. Kidneys are normal, without renal calculi, focal lesion, or hydronephrosis. Bladder is unremarkable. Stomach/Bowel: Stomach, small bowel, and colon are not abnormally distended. No wall thickening or inflammatory changes. Appendix is not identified. Vascular/Lymphatic: Aortic  atherosclerosis. No enlarged abdominal or pelvic lymph nodes. Reproductive: Prostate gland is enlarged Other: No free air or free fluid in the abdomen. No loculated collections are identified. Abdominal wall musculature appears intact. Musculoskeletal: Degenerative changes in the spine. No acute bony abnormalities. Review of the MIP images confirms the above findings. IMPRESSION: 1. Postprocedural changes in the liver with percutaneous biliary drain in place. Tip is in the duodenum. Residual contrast material within mildly dilated left intrahepatic bile ducts. Gas in the gallbladder is also likely postprocedural. 2. No abscess or free air in the abdomen. 3. No evidence of significant pulmonary embolus. 4. Patchy peripheral infiltrates in the  lungs possibly representing multifocal pneumonia or scarring. 5. Scattered nodules in the lungs, largest in the right middle lung measuring 7 mm diameter. Non-contrast chest CT at 3-6 months is recommended. If the nodules are stable at time of repeat CT, then future CT at 18-24 months (from today's scan) is considered optional for low-risk patients, but is recommended for high-risk patients. This recommendation follows the consensus statement: Guidelines for Management of Incidental Pulmonary Nodules Detected on CT Images: From the Fleischner Society 2017; Radiology 2017; 284:228-243. 6. Aortic atherosclerosis. 7. 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 Electronically Signed   By: Elsie Gravely M.D.   On: 04/18/2024 02:13   DG C-Arm 1-60 Min-No Report Result Date: 04/17/2024 Fluoroscopy was utilized by the requesting physician.  No radiographic interpretation.   NM PET Image Initial (PI) Skull Base To Thigh (F-18 FDG) Addendum Date: 04/12/2024 ADDENDUM REPORT: 04/12/2024 11:23 ADDENDUM: Additional impression. There is some asymmetric uptake in the area of the sella turcica/pituitary. Dedicated brain imaging could be performed with MRI as clinically appropriate to exclude lesion.  Please correlate for any history Electronically Signed   By: Ranell Bring M.D.   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 EXAM: NUCLEAR MEDICINE PET SKULL BASE TO THIGH TECHNIQUE: 10.5 mCi F-18 FDG was injected intravenously. Full-ring PET imaging was performed from the skull base to thigh after the radiotracer. CT data was obtained and used for attenuation correction and anatomic localization. Fasting blood glucose: 108 mg/dl COMPARISON:  MRI abdomen 03/14/2024. CT abdomen pelvis 03/10/2024. CTA chest 08/14/2020. FINDINGS: Mediastinal blood pool activity: SUV max 2.5 Liver activity: SUV max 3.2 NECK: There is no specific abnormal uptake identified in the neck including along lymph node change of the submandibular, posterior triangle or internal jugular regions. Near symmetric uptake of the visualized intracranial compartment. However there is some uptake in the area of the sella. Please correlate for any known history. Dedicated MRI could be performed clinically appropriate. Incidental CT findings: Visualized paranasal sinuses and mastoid air cells are clear. Left sided concha bullosa. The parotid glands, submandibular glands and thyroid gland are unremarkable. Scattered vascular calcifications. CHEST: No specific abnormal radiotracer uptake identified above blood pool in the axillary regions, hilum or mediastinum. There is a small focus of uptake within the left upper lobe with maximum SUV of 2.3. This is seen on PET image 38. There is no CT correlate to a nodule in this location. Diameter of uptake approaches 6 mm. There is a nodule in the middle lobe however on CT image 54 measuring 5 mm which does not show any abnormal uptake and was present on the CT of 2021 demonstrating long-term stability. Incidental CT findings: Breathing motion. There is some linear opacity lung bases likely scar or atelectasis. Some wispy  ground-glass areas are identified which could be scarring or fibrotic changes based on the prior CT scan. Heart is enlarged. Scattered vascular calcifications are seen including along the coronary arteries. Patulous esophagus. ABDOMEN/PELVIS: There is a focal area of uptake which is somewhat linear in configuration with maximum SUV value of 8.0 centered at the hilum of the liver extending to the left hepatic lobe which is in the location of the stricture seen on the previous MRI and may correspond to the area of neoplasm. There is a more subtle area of uptake identified towards the ampulla measuring maximum SUV of 6.2 but this is in the exact location of the stents. One being a right hepatic  lobe PTC in the other endoscopic stent extending towards the left. This could be more artifactual as there is no clear mass lesion in this location on previous exam. Elsewhere there is physiologic distribution radiotracer along the parenchymal organs, bowel and renal collecting systems. No clear abnormal nodal uptake identified. Incidental CT findings: S dated placement of biliary stents. The biliary ductal dilatation in the right hepatic lobe is improved. The left hepatic lobe is also slightly improved with some residual. There is atrophy of the left hepatic lobe lateral segment. Air in the nondilated gallbladder. Mild global atrophy of the pancreas. The spleen, adrenal glands are grossly preserved on this noncontrast attenuation correction CT. Renal cysts are again identified as on prior MRI. No abnormal calcifications seen within either kidney nor along the course of either ureter. Underdistended urinary bladder. Heterogeneous enlarged prostate. Large bowel has a normal course and caliber with scattered stool. Colonic diverticula identified. Stomach is underdistended. Small bowel is nondilated. Of note the cecum resides in the anterior right hemipelvis. Scattered vascular calcifications. Normal caliber aorta and IVC. Few  small scattered nodes identified in the abdomen pelvis, not pathologic by size criteria. Small fat containing inguinal hernias. SKELETON: No abnormal uptake along the visualized osseous structures. Incidental CT findings: Scattered diffuse degenerative changes identified. Curvature of the spine. IMPRESSION: Focal area of uptake identified in the hilum of the liver at the location of strictures seen by previous imaging consistent with area of neoplasm. No other areas of abnormal uptake at this time suggest additional areas of disease. Indwelling right hepatic lobe PTC and endoscopic left-sided stent. Some improvement of the intrahepatic biliary ductal dilatation with residual particularly along the left hepatic lobe. Left hepatic lobe is also atrophic. There is a small focus of asymmetric uptake along the left upper lobe of the lung without a corresponding lung nodule on CT scan. In the setting recommend a dedicated high-resolution CT scan for further spacer resolution to see if there is an underlying lesion versus a follow up CT in 3 months. Residual areas of scarring and fibrotic changes in a similar distribution to the abnormality on prior remote CT scan of the chest. There is a right-sided lung nodule which is stable since 2021 has no abnormal uptake. No additional follow-up of this specific nodule. Electronically Signed: By: Ranell Bring M.D. On: 04/12/2024 10:38    Microbiology: Results for orders placed or performed during the hospital encounter of 04/26/24  Culture, blood (Routine x 2)     Status: None (Preliminary result)   Collection Time: 04/26/24  4:02 PM   Specimen: BLOOD  Result Value Ref Range Status   Specimen Description BLOOD BLOOD LEFT ARM  Final   Special Requests   Final    BOTTLES DRAWN AEROBIC AND ANAEROBIC Blood Culture adequate volume   Culture   Final    NO GROWTH 2 DAYS Performed at [facility], [address], [place], [place] [address]    Report Status PENDING   Incomplete  Culture, blood (Routine x 2)     Status: None (Preliminary result)   Collection Time: 04/26/24  4:03 PM   Specimen: BLOOD  Result Value Ref Range Status   Specimen Description BLOOD BLOOD RIGHT ARM  Final   Special Requests   Final    BOTTLES DRAWN AEROBIC AND ANAEROBIC Blood Culture results may not be optimal due to an inadequate volume of blood received in culture bottles   Culture   Final    NO GROWTH 2  DAYS Performed at [facility], [address] [place], [place] [address]    Report Status PENDING  Incomplete  Resp panel by RT-PCR (RSV, Flu A&B, Covid) Anterior Nasal Swab     Status: None   Collection Time: 04/26/24  6:27 PM   Specimen: Anterior Nasal Swab  Result Value Ref Range Status   SARS Coronavirus 2 by RT PCR NEGATIVE NEGATIVE Final    Comment: (NOTE) SARS-CoV-2 target nucleic acids are NOT DETECTED.  The SARS-CoV-2 RNA is generally detectable in upper respiratory specimens during the acute phase of infection. The lowest concentration of SARS-CoV-2 viral copies this assay can detect is 138 copies/mL. A negative result does not preclude SARS-Cov-2 infection and should not be used as the sole basis for treatment or other patient management decisions. A negative result may occur with  improper specimen collection/handling, submission of specimen other than nasopharyngeal swab, presence of viral mutation(s) within the areas targeted by this assay, and inadequate number of viral copies(<138 copies/mL). A negative result must be combined with clinical observations, patient history, and epidemiological information. The expected result is Negative.  Fact Sheet for Patients:  BloggerCourse.com  Fact Sheet for Healthcare Providers:  SeriousBroker.it  This test is no t yet approved or cleared by the [place]  FDA and  has been authorized for detection and/or diagnosis of SARS-CoV-2 by FDA under an  Emergency Use Authorization (EUA). This EUA will remain  in effect (meaning this test can be used) for the duration of the COVID-19 declaration under Section 564(b)(1) of the Act, 21 U.S.C.section 360bbb-3(b)(1), unless the authorization is terminated  or revoked sooner.       Influenza A by PCR NEGATIVE NEGATIVE Final   Influenza B by PCR NEGATIVE NEGATIVE Final    Comment: (NOTE) The Xpert Xpress SARS-CoV-2/FLU/RSV plus assay is intended as an aid in the diagnosis of influenza from Nasopharyngeal swab specimens and should not be used as a sole basis for treatment. Nasal washings and aspirates are unacceptable for Xpert Xpress SARS-CoV-2/FLU/RSV testing.  Fact Sheet for Patients: BloggerCourse.com  Fact Sheet for Healthcare Providers: SeriousBroker.it  This test is not yet approved or cleared by the [place]  FDA and has been authorized for detection and/or diagnosis of SARS-CoV-2 by FDA under an Emergency Use Authorization (EUA). This EUA will remain in effect (meaning this test can be used) for the duration of the COVID-19 declaration under Section 564(b)(1) of the Act, 21 U.S.C. section 360bbb-3(b)(1), unless the authorization is terminated or revoked.     Resp Syncytial Virus by PCR NEGATIVE NEGATIVE Final    Comment: (NOTE) Fact Sheet for Patients: BloggerCourse.com  Fact Sheet for Healthcare Providers: SeriousBroker.it  This test is not yet approved or cleared by the [place]  FDA and has been authorized for detection and/or diagnosis of SARS-CoV-2 by FDA under an Emergency Use Authorization (EUA). This EUA will remain in effect (meaning this test can be used) for the duration of the COVID-19 declaration under Section 564(b)(1) of the Act, 21 U.S.C. section 360bbb-3(b)(1), unless the authorization is terminated or revoked.  Performed at [facility], [address] [place], [place] [address]   Respiratory (~20 pathogens) panel by PCR     Status: None   Collection Time: 04/26/24  6:27 PM   Specimen: Nasopharyngeal Swab; Respiratory  Result Value Ref Range Status   Adenovirus NOT DETECTED NOT DETECTED Final   Coronavirus 229E NOT DETECTED NOT DETECTED Final    Comment: (NOTE) The Coronavirus on the  Respiratory Panel, DOES NOT test for the novel  Coronavirus (2019 nCoV)    Coronavirus HKU1 NOT DETECTED NOT DETECTED Final   Coronavirus NL63 NOT DETECTED NOT DETECTED Final   Coronavirus OC43 NOT DETECTED NOT DETECTED Final   Metapneumovirus NOT DETECTED NOT DETECTED Final   Rhinovirus / Enterovirus NOT DETECTED NOT DETECTED Final   Influenza A NOT DETECTED NOT DETECTED Final   Influenza B NOT DETECTED NOT DETECTED Final   Parainfluenza Virus 1 NOT DETECTED NOT DETECTED Final   Parainfluenza Virus 2 NOT DETECTED NOT DETECTED Final   Parainfluenza Virus 3 NOT DETECTED NOT DETECTED Final   Parainfluenza Virus 4 NOT DETECTED NOT DETECTED Final   Respiratory Syncytial Virus NOT DETECTED NOT DETECTED Final   Bordetella pertussis NOT DETECTED NOT DETECTED Final   Bordetella Parapertussis NOT DETECTED NOT DETECTED Final   Chlamydophila pneumoniae NOT DETECTED NOT DETECTED Final   Mycoplasma pneumoniae NOT DETECTED NOT DETECTED Final    Comment: Performed at [facility], [address] [place], [place] [address]  MRSA Next Gen by PCR, Nasal     Status: None   Collection Time: 04/27/24 12:15 PM   Specimen: Nasal Mucosa; Nasal Swab  Result Value Ref Range Status   MRSA by PCR Next Gen NOT DETECTED NOT DETECTED Final    Comment: (NOTE) The GeneXpert MRSA Assay (FDA approved for NASAL specimens only), is one component of a comprehensive MRSA colonization surveillance program. It is not intended to diagnose MRSA infection nor to guide or monitor treatment for MRSA infections. Test performance is not FDA approved in patients  less than 36 years old. Performed at [facility], [address] [place], [place] [address]     Labs: CBC: Recent Labs  Lab 04/23/24 0249 04/24/24 0707 04/26/24 1602 04/27/24 0426 04/28/24 0527  WBC 7.1 8.2 17.4* 13.2* 10.3  NEUTROABS  --   --  14.7*  --   --   HGB 11.3* 12.1* 11.8* 11.0* 11.0*  HCT 33.6* 34.8* 34.6* 32.9* 32.8*  MCV 93.3 92.1 94.3 93.7 92.9  PLT 168 204 324 317 335   Basic Metabolic Panel: Recent Labs  Lab 04/22/24 0316 04/23/24 0249 04/24/24 0707 04/26/24 1602 04/27/24 0426 04/28/24 0527  NA 139 137 138 137 135 137  K 4.0 3.6 3.8 3.8 3.3* 3.5  CL 101 105 102 101 101 104  CO2 28 27 26 25 25 26   GLUCOSE 157* 117* 121* 138* 116* 104*  BUN 20 17 16 16 14 13   CREATININE 1.18 1.11 0.95 1.10 0.90 0.85  CALCIUM  8.2* 7.6* 8.1* 8.0* 7.8* 8.0*  MG 1.9 1.9 1.8  --  1.9  --   PHOS 3.0 2.1* 2.4*  --   --   --    Liver Function Tests: Recent Labs  Lab 04/23/24 0249 04/24/24 0707 04/26/24 1602 04/27/24 0426 04/28/24 0527  AST 25 23 33 21 18  ALT 33 31 38 34 27  ALKPHOS 81 106 177* 145* 123  BILITOT 1.1 1.2 0.9 1.6* 1.0  PROT 5.7* 5.6* 6.2* 5.6* 5.7*  ALBUMIN 2.2* 2.3* 2.5* 2.3* 2.4*   CBG: Recent Labs  Lab 04/27/24 1143 04/27/24 1716 04/27/24 2059 04/28/24 0754 04/28/24 0955  GLUCAP 128* 114* 114* 112* 113*    Discharge time spent: greater than 30 minutes.  This record has been created using Conservation officer, historic buildings. Errors have been sought and corrected,but may not always be located. Such creation errors do not reflect on the standard of  care.   Signed: Amaryllis Dare, MD [facility] 04/28/2024

## 2024-04-28 NOTE — Progress Notes (Signed)
 PHARMACY CONSULT NOTE FOR:  OUTPATIENT  PARENTERAL ANTIBIOTIC THERAPY (OPAT)  Indication: E coli bacteremia and biliary obstruction Regimen: Ertapenem  1gm IV q24h End date: 05/01/2024  -Labs - Once weekly:  CBC/D and CMP -Fax weekly lab results  promptly to [phone] -Please pull midline at completion of IV antibiotics on 05/02/24 -Call [phone] with any critical values or questions  IV antibiotic discharge orders are pended. To discharging provider:  please sign these orders via discharge navigator,  Select New Orders & click on the button choice - Manage This Unsigned Work.     Thank you for allowing pharmacy to be a part of this patient's care.  Maddyson Keil, PharmD, BCPS, BCIDP Work Cell: [phone] 04/28/2024 12:09 PM

## 2024-04-28 NOTE — TOC Initial Note (Signed)
 Transition of Care [facility]) - Initial/Assessment Note    Patient Details  Name: Casey Peters MRN: [id number] Date of Birth: 02-Nov-1945  [facility] of [facility]) CM/SW Contact:    Casey ONEIDA Haddock, RN Phone Number: 04/28/2024, 2:50 PM  Clinical Narrative:                  Patient to discharge today with resumption of home health and IV antibiotics  Darleene with [facility] and Pam with [employer] aware OPAT order in MD to order [facility] RN PT        Patient Goals and CMS Choice            Expected Discharge Plan and Services         Expected Discharge Date: 04/28/24                                    Prior Living Arrangements/Services                       Activities of Daily Living      Permission Sought/Granted                  Emotional Assessment              Admission diagnosis:  Fever [R50.9] Sepsis, due to unspecified organism, unspecified whether acute organ dysfunction present [name]) [A41.9] Patient Active Problem List   Diagnosis Date Noted   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   Sepsis (HCC) 04/27/2024   CAD S/P percutaneous coronary angioplasty 04/27/2024   Cardiomegaly    Fever 04/26/2024   Leukocytosis 04/26/2024   E coli bacteremia 04/19/2024   Severe sepsis (HCC) 04/18/2024   Acute respiratory failure with hypoxia (HCC) 04/18/2024   HCAP (healthcare-associated pneumonia) 04/18/2024   AKI (acute kidney injury) (HCC) 04/18/2024   Septic shock (HCC) 04/18/2024   Biliary obstruction 04/17/2024   Elevated tumor markers 03/28/2024   Elevated liver enzymes 03/17/2024   Painless jaundice 03/14/2024   Shoulder pain 12/28/2021   STEMI (ST elevation myocardial infarction) (HCC) 12/27/2021   STEMI involving right coronary artery (HCC) 12/27/2021   S/P cardiac catheterization 12/27/2021   Dilated idiopathic cardiomyopathy (HCC)    Hypertriglyceridemia    HFrEF (heart failure with reduced ejection fraction) (HCC)     Palpitations 02/08/2020   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   Pain of finger 06/29/2019   Chronic systolic heart failure (HCC) 10/25/2018   Tobacco use 10/25/2018   Rising PSA level 07/10/2015   Microscopic hematuria 07/10/2015   Type 2 diabetes mellitus (HCC) 06/08/2014   HLD (hyperlipidemia) 06/08/2014   BP (high blood pressure) 06/08/2014   Obesity, diabetes, and hypertension syndrome (HCC) 06/08/2014   Adiposity 06/08/2014   Controlled type 2 diabetes mellitus without complication (HCC) 06/08/2014   PCP:  Casey Norleen BIRCH, MD Pharmacy:   [facility] - [place], [place] - [address] [place] [address] Phone: [phone] Fax: [phone]     Social Drivers of Health (SDOH) Social History: SDOH Screenings   Food Insecurity: No Food Insecurity (04/28/2024)  Housing: Low Risk  (04/28/2024)  Transportation Needs: No Transportation Needs (04/28/2024)  Utilities: Not At Risk (04/28/2024)  Depression (PHQ2-9): Low Risk  (03/28/2024)  Social Connections: Socially Integrated (04/28/2024)  Tobacco Use: High Risk (04/28/2024)   SDOH Interventions:     Readmission Risk Interventions     No data  to display

## 2024-04-28 NOTE — Procedures (Signed)
 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 PERCUTANEOUS TRANSHEPATIC BILIARY DRAIN EXCHANGE 2. ANTEROGRADE CHOLANGIOGRAM 3. BILIARY BRUSH BIOPSY of HILAR STRICTURE  Anesthesia: Conscious Sedation Complications: None Estimated Blood Loss: Minimal Specimens:  Cytology   Findings:  Successful exchange and upsize for a 41F PTBD tube. Brush Bx of the hilar stricture and diagnostic aspiration of bile     Plan: Flush tube w 5 mL sterile NS q8h and record drain output qShift. Follow up for routine tube evaluation in 6-8 week(s).   See detailed procedure note with images in PACS. The patient tolerated the procedure well without incident or complication and was returned to Recovery in stable condition.    Thom Hall, MD [facility] Radiology   Pager. [phone] Clinic. [phone]

## 2024-04-28 NOTE — Progress Notes (Signed)
 Spoke to the patient and his wife this morning about the findings on the CT scan showing that the stent I had placed had migrated.  The patient is going for a drain change today with hopeful brushings of the stricture to make a diagnosis of this patient's liver lesion. The patient and his wife had multiple questions which were answered.  The patient was also told that the stent may eventually fall out with the manipulation is being done today and then would likely pass through his GI tract.

## 2024-05-01 ENCOUNTER — Inpatient Hospital Stay

## 2024-05-01 ENCOUNTER — Observation Stay
Admission: EM | Admit: 2024-05-01 | Discharge: 2024-05-09 | Disposition: A | Discharge status: Home Health | Attending: Internal Medicine | Admitting: Internal Medicine

## 2024-05-01 ENCOUNTER — Other Ambulatory Visit: Payer: Self-pay

## 2024-05-01 DIAGNOSIS — Z955 Presence of coronary angioplasty implant and graft: Secondary | ICD-10-CM | POA: Diagnosis not present

## 2024-05-01 DIAGNOSIS — I5032 Chronic diastolic (congestive) heart failure: Secondary | ICD-10-CM | POA: Insufficient documentation

## 2024-05-01 DIAGNOSIS — I5020 Unspecified systolic (congestive) heart failure: Secondary | ICD-10-CM | POA: Diagnosis present

## 2024-05-01 DIAGNOSIS — A499 Bacterial infection, unspecified: Secondary | ICD-10-CM | POA: Diagnosis not present

## 2024-05-01 DIAGNOSIS — Z7982 Long term (current) use of aspirin: Secondary | ICD-10-CM | POA: Diagnosis not present

## 2024-05-01 DIAGNOSIS — E781 Pure hyperglyceridemia: Secondary | ICD-10-CM | POA: Diagnosis not present

## 2024-05-01 DIAGNOSIS — D72829 Elevated white blood cell count, unspecified: Secondary | ICD-10-CM | POA: Diagnosis present

## 2024-05-01 DIAGNOSIS — M6281 Muscle weakness (generalized): Secondary | ICD-10-CM | POA: Diagnosis not present

## 2024-05-01 DIAGNOSIS — I2510 Atherosclerotic heart disease of native coronary artery without angina pectoris: Secondary | ICD-10-CM | POA: Diagnosis not present

## 2024-05-01 DIAGNOSIS — A689 Relapsing fever, unspecified: Principal | ICD-10-CM | POA: Diagnosis present

## 2024-05-01 DIAGNOSIS — E785 Hyperlipidemia, unspecified: Secondary | ICD-10-CM | POA: Diagnosis not present

## 2024-05-01 DIAGNOSIS — B9620 Unspecified Escherichia coli [E. coli] as the cause of diseases classified elsewhere: Secondary | ICD-10-CM | POA: Diagnosis present

## 2024-05-01 DIAGNOSIS — R748 Abnormal levels of other serum enzymes: Secondary | ICD-10-CM | POA: Diagnosis present

## 2024-05-01 DIAGNOSIS — F17220 Nicotine dependence, chewing tobacco, uncomplicated: Secondary | ICD-10-CM | POA: Diagnosis not present

## 2024-05-01 DIAGNOSIS — I110 Hypertensive heart disease with heart failure: Secondary | ICD-10-CM | POA: Insufficient documentation

## 2024-05-01 DIAGNOSIS — E119 Type 2 diabetes mellitus without complications: Secondary | ICD-10-CM | POA: Insufficient documentation

## 2024-05-01 DIAGNOSIS — B999 Unspecified infectious disease: Secondary | ICD-10-CM

## 2024-05-01 DIAGNOSIS — Z720 Tobacco use: Secondary | ICD-10-CM | POA: Diagnosis present

## 2024-05-01 DIAGNOSIS — R2689 Other abnormalities of gait and mobility: Secondary | ICD-10-CM | POA: Diagnosis not present

## 2024-05-01 DIAGNOSIS — R509 Fever, unspecified: Secondary | ICD-10-CM | POA: Diagnosis present

## 2024-05-01 DIAGNOSIS — Z794 Long term (current) use of insulin: Secondary | ICD-10-CM | POA: Insufficient documentation

## 2024-05-01 DIAGNOSIS — I5022 Chronic systolic (congestive) heart failure: Secondary | ICD-10-CM | POA: Diagnosis not present

## 2024-05-01 DIAGNOSIS — R7881 Bacteremia: Secondary | ICD-10-CM | POA: Diagnosis present

## 2024-05-01 DIAGNOSIS — A419 Sepsis, unspecified organism: Principal | ICD-10-CM | POA: Insufficient documentation

## 2024-05-01 DIAGNOSIS — R531 Weakness: Secondary | ICD-10-CM

## 2024-05-01 HISTORY — DX: Atherosclerotic heart disease of native coronary artery without angina pectoris: I25.10

## 2024-05-01 LAB — COMPREHENSIVE METABOLIC PANEL WITH GFR
ALT: 21 U/L (ref 0–44)
AST: 19 U/L (ref 15–41)
Albumin: 2.8 g/dL — ABNORMAL LOW (ref 3.5–5.0)
Alkaline Phosphatase: 130 U/L — ABNORMAL HIGH (ref 38–126)
Anion gap: 10 (ref 5–15)
BUN: 14 mg/dL (ref 8–23)
CO2: 24 mmol/L (ref 22–32)
Calcium: 8.6 mg/dL — ABNORMAL LOW (ref 8.9–10.3)
Chloride: 99 mmol/L (ref 98–111)
Creatinine, Ser: 0.99 mg/dL (ref 0.61–1.24)
GFR, Estimated: >60 mL/min (ref >60)
Glucose, Bld: 170 mg/dL — ABNORMAL HIGH (ref 70–99)
Potassium: 4.2 mmol/L (ref 3.5–5.1)
Sodium: 133 mmol/L — ABNORMAL LOW (ref 135–145)
Total Bilirubin: 1.2 mg/dL (ref 0.0–1.2)
Total Protein: 7 g/dL (ref 6.5–8.1)

## 2024-05-01 LAB — URINALYSIS, ROUTINE W REFLEX MICROSCOPIC
Bilirubin Urine: NEGATIVE
Glucose, UA: NEGATIVE mg/dL
Ketones, ur: NEGATIVE mg/dL
Leukocytes,Ua: NEGATIVE
Nitrite: NEGATIVE
Protein, ur: NEGATIVE mg/dL
Specific Gravity, Urine: 1.025 (ref 1.005–1.030)
pH: 5 (ref 5.0–8.0)

## 2024-05-01 LAB — CBC
HCT: 36.9 % — ABNORMAL LOW (ref 39.0–52.0)
Hemoglobin: 12.5 g/dL — ABNORMAL LOW (ref 13.0–17.0)
MCH: 31.3 pg (ref 26.0–34.0)
MCHC: 33.9 g/dL (ref 30.0–36.0)
MCV: 92.3 fL (ref 80.0–100.0)
Platelets: 424 K/uL — ABNORMAL HIGH (ref 150–400)
RBC: 4 MIL/uL — ABNORMAL LOW (ref 4.22–5.81)
RDW: 14.9 % (ref 11.5–15.5)
WBC: 14.2 K/uL — ABNORMAL HIGH (ref 4.0–10.5)
nRBC: 0 % (ref 0.0–0.2)

## 2024-05-01 LAB — CULTURE, BLOOD (ROUTINE X 2)
Culture: NO GROWTH
Culture: NO GROWTH
Special Requests: ADEQUATE

## 2024-05-01 LAB — GLUCOSE, CAPILLARY
Glucose-Capillary: 159 mg/dL — ABNORMAL HIGH (ref 70–99)
Glucose-Capillary: 123 mg/dL — ABNORMAL HIGH (ref 70–99)

## 2024-05-01 LAB — LACTIC ACID, PLASMA: Lactic Acid, Venous: 0.8 mmol/L (ref 0.5–1.9)

## 2024-05-01 LAB — PROTIME-INR
INR: 1.2 (ref 0.8–1.2)
Prothrombin Time: 15.8 s — ABNORMAL HIGH (ref 11.4–15.2)

## 2024-05-01 MED ORDER — NICOTINE 21 MG/24HR TD PT24: 21.0000 mg | MEDICATED_PATCH | Freq: Every day | TRANSDERMAL | Status: AC | PRN

## 2024-05-01 MED ORDER — OXYCODONE HCL 5 MG PO TABS
5.0000 mg | ORAL_TABLET | Freq: Three times a day (TID) | ORAL | Status: DC | PRN
  Administered 2024-05-04 – 2024-05-07 (×5): 5 mg via ORAL
  Filled 2024-05-01 (×5): qty 1

## 2024-05-01 MED ORDER — CARVEDILOL 3.125 MG PO TABS
6.2500 mg | ORAL_TABLET | Freq: Two times a day (BID) | ORAL | Status: DC
  Administered 2024-05-01 – 2024-05-02 (×2): 6.25 mg via ORAL
  Filled 2024-05-01 (×3): qty 2

## 2024-05-01 MED ORDER — VANCOMYCIN HCL IN DEXTROSE 1-5 GM/200ML-% IV SOLN
1000.0000 mg | Freq: Once | INTRAVENOUS | Status: DC
  Filled 2024-05-01: qty 200

## 2024-05-01 MED ORDER — SENNOSIDES-DOCUSATE SODIUM 8.6-50 MG PO TABS: 1.0000 | ORAL_TABLET | Freq: Every evening | ORAL | Status: DC | PRN

## 2024-05-01 MED ORDER — INSULIN ASPART 100 UNIT/ML IJ SOLN: 0.0000 [IU] | Freq: Every day | INTRAMUSCULAR | Status: DC

## 2024-05-01 MED ORDER — LACTATED RINGERS IV BOLUS (SEPSIS)
1000.0000 mL | Freq: Once | INTRAVENOUS | Status: AC
  Administered 2024-05-01: 1000 mL via INTRAVENOUS

## 2024-05-01 MED ORDER — HEPARIN SODIUM (PORCINE) 5000 UNIT/ML IJ SOLN
5000.0000 [IU] | Freq: Three times a day (TID) | INTRAMUSCULAR | Status: DC
  Administered 2024-05-01 – 2024-05-02 (×3): 5000 [IU] via SUBCUTANEOUS
  Filled 2024-05-01 (×3): qty 1

## 2024-05-01 MED ORDER — METRONIDAZOLE 500 MG/100ML IV SOLN
500.0000 mg | Freq: Once | INTRAVENOUS | Status: DC
  Filled 2024-05-01: qty 100

## 2024-05-01 MED ORDER — ONDANSETRON HCL 4 MG PO TABS
4.0000 mg | ORAL_TABLET | Freq: Four times a day (QID) | ORAL | Status: AC | PRN
  Filled 2024-05-01: qty 1

## 2024-05-01 MED ORDER — PANTOPRAZOLE SODIUM 40 MG PO TBEC
40.0000 mg | DELAYED_RELEASE_TABLET | Freq: Two times a day (BID) | ORAL | Status: DC
  Administered 2024-05-01 – 2024-05-08 (×15): 40 mg via ORAL
  Filled 2024-05-01 (×15): qty 1

## 2024-05-01 MED ORDER — ACETAMINOPHEN 325 MG PO TABS
650.0000 mg | ORAL_TABLET | Freq: Four times a day (QID) | ORAL | Status: DC | PRN
Start: 2024-05-01 — End: 2024-05-02

## 2024-05-01 MED ORDER — ASPIRIN 81 MG PO TBEC
81.0000 mg | DELAYED_RELEASE_TABLET | Freq: Every day | ORAL | Status: DC
Start: 2024-05-01 — End: 2024-05-09
  Administered 2024-05-01 – 2024-05-08 (×8): 81 mg via ORAL
  Filled 2024-05-01 (×8): qty 1

## 2024-05-01 MED ORDER — SODIUM CHLORIDE 0.9 % IV SOLN
1.0000 g | Freq: Three times a day (TID) | INTRAVENOUS | Status: AC
  Administered 2024-05-01 – 2024-05-02 (×3): 1 g via INTRAVENOUS
  Filled 2024-05-01 (×5): qty 20

## 2024-05-01 MED ORDER — ONDANSETRON HCL 4 MG/2ML IJ SOLN: 4.0000 mg | Freq: Four times a day (QID) | INTRAMUSCULAR | Status: AC | PRN

## 2024-05-01 MED ORDER — ACETAMINOPHEN 650 MG RE SUPP: 650.0000 mg | Freq: Four times a day (QID) | RECTAL | Status: DC | PRN

## 2024-05-01 MED ORDER — ALUM & MAG HYDROXIDE-SIMETH 200-200-20 MG/5ML PO SUSP
15.0000 mL | Freq: Four times a day (QID) | ORAL | Status: DC | PRN
  Administered 2024-05-01 – 2024-05-07 (×5): 15 mL via ORAL
  Filled 2024-05-01 (×6): qty 30

## 2024-05-01 MED ORDER — SODIUM CHLORIDE 0.9 % IV SOLN
2.0000 g | Freq: Once | INTRAVENOUS | Status: AC
  Administered 2024-05-01: 2 g via INTRAVENOUS
  Filled 2024-05-01: qty 12.5

## 2024-05-01 MED ORDER — LACTATED RINGERS IV SOLN: INTRAVENOUS | Status: DC

## 2024-05-01 MED ORDER — INSULIN ASPART 100 UNIT/ML IJ SOLN
0.0000 [IU] | Freq: Three times a day (TID) | INTRAMUSCULAR | Status: DC
  Administered 2024-05-01: 2 [IU] via SUBCUTANEOUS
  Administered 2024-05-02: 1 [IU] via SUBCUTANEOUS
  Administered 2024-05-02: 2 [IU] via SUBCUTANEOUS
  Administered 2024-05-02: 1 [IU] via SUBCUTANEOUS
  Filled 2024-05-01 (×4): qty 1

## 2024-05-01 NOTE — Assessment & Plan Note (Signed)
 Insulin SSI with at bedtime coverage ordered

## 2024-05-01 NOTE — ED Provider Notes (Signed)
 [facility] Provider Note    Event Date/Time   First MD Initiated Contact with Patient 05/01/24 1222     (approximate)   History   Weakness and rehab placement   HPI  Casey Peters is a 78 y.o. male with recurrent history of sepsis who comes in again with increasing weakness.  Family report that upon discharge patient was able to get up walk around but yesterday noticed a change where he seemed more weak unable to stand up denies any falls or hitting his head but he did slide down and was just very weak to try to get up.  Wife does report that he had temperature of 102 last night and again this morning and did get Tylenol  both times.  He denies any abdominal pain, chest pain or other concerns.  Physical Exam   Triage Vital Signs: ED Triage Vitals  Encounter Vitals Group     BP 05/01/24 1029 99/62     Girls Systolic BP Percentile --      Girls Diastolic BP Percentile --      Boys Systolic BP Percentile --      Boys Diastolic BP Percentile --      Pulse Rate 05/01/24 1029 88     Resp 05/01/24 1029 20     Temp 05/01/24 1029 99.6 F (37.6 C)     Temp Source 05/01/24 1029 Oral     SpO2 05/01/24 1029 96 %     Weight 05/01/24 1027 179 lb 7.3 oz (81.4 kg)     Height 05/01/24 1027 5' 11 (1.803 m)     Head Circumference --      Peak Flow --      Pain Score 05/01/24 1027 0     Pain Loc --      Pain Education --      Exclude from Growth Chart --     Most recent vital signs: Vitals:   05/01/24 1029  BP: 99/62  Pulse: 88  Resp: 20  Temp: 99.6 F (37.6 C)  SpO2: 96%     General: Awake, no distress.  CV:  Good peripheral perfusion.  Resp:  Normal effort.  Abd:  No distention.  Soft and nontender Other:  Drain noted from liver No infection of the feet Equal strength in arms and legs.  Alert and oriented x 3 no cranial nerve deficits ED Results / Procedures / Treatments   Labs (all labs ordered are listed, but only abnormal results are  displayed) Labs Reviewed  COMPREHENSIVE METABOLIC PANEL WITH GFR - Abnormal; Notable for the following components:      Result Value   Sodium 133 (*)    Glucose, Bld 170 (*)    Calcium  8.6 (*)    Albumin 2.8 (*)    Alkaline Phosphatase 130 (*)    All other components within normal limits  CBC - Abnormal; Notable for the following components:   WBC 14.2 (*)    RBC 4.00 (*)    Hemoglobin 12.5 (*)    HCT 36.9 (*)    Platelets 424 (*)    All other components within normal limits  PROTIME-INR - Abnormal; Notable for the following components:   Prothrombin Time 15.8 (*)    All other components within normal limits  CULTURE, BLOOD (ROUTINE X 2)  CULTURE, BLOOD (ROUTINE X 2)  LACTIC ACID, PLASMA  URINALYSIS, ROUTINE W REFLEX MICROSCOPIC  LACTIC ACID, PLASMA  CBG MONITORING, ED  EKG  My interpretation of EKG:  Normal sinus rate of 88 without any ST elevation, T wave inversion in lead III and aVF, normal intervals  RADIOLOGY none   PROCEDURES:  Critical Care performed: Yes, see critical care procedure note(s)  .1-3 Lead EKG Interpretation  Performed by: Ernest Ronal BRAVO, MD Authorized by: Ernest Ronal BRAVO, MD     Interpretation: normal     ECG rate:  80   ECG rate assessment: normal     Rhythm: sinus rhythm     Ectopy: none     Conduction: normal   .Critical Care  Performed by: Ernest Ronal BRAVO, MD Authorized by: Ernest Ronal BRAVO, MD   Critical care provider statement:    Critical care time (minutes):  30   Critical care was necessary to treat or prevent imminent or life-threatening deterioration of the following conditions:  Sepsis   Critical care was time spent personally by me on the following activities:  Development of treatment plan with patient or surrogate, discussions with consultants, evaluation of patient's response to treatment, examination of patient, ordering and review of laboratory studies, ordering and review of radiographic studies, ordering and performing  treatments and interventions, pulse oximetry, re-evaluation of patient's condition and review of old charts    MEDICATIONS ORDERED IN ED: Medications  lactated ringers  infusion (has no administration in time range)  ceFEPIme  (MAXIPIME ) 2 g in sodium chloride  0.9 % 100 mL IVPB (has no administration in time range)  metroNIDAZOLE  (FLAGYL ) IVPB 500 mg (has no administration in time range)  vancomycin  (VANCOCIN ) IVPB 1000 mg/200 mL premix (has no administration in time range)  lactated ringers  bolus 1,000 mL (has no administration in time range)     IMPRESSION / MDM / ASSESSMENT AND PLAN / ED COURSE  I reviewed the triage vital signs and the nursing notes.   Patient's presentation is most consistent with acute presentation with potential threat to life or bodily function.   Patient comes in with concerns for recurrent sepsis.  Unclear what is causing the patient's source as every time he goes home after a few days he starts having fever, weaknes like to ago without acute pathology and denies any abdominal pain.  Will start patient on fluids broad-spectrum antibiotics although already on ertapenem  so I am not sure if the hospitalist will need to have a discussion with ID.  Kidney function remained stable.  Lactate is normal.  Will discuss with the hospitalist for admission.  The patient is on the cardiac monitor to evaluate for evidence of arrhythmia and/or significant heart rate changes.      FINAL CLINICAL IMPRESSION(S) / ED DIAGNOSES   Final diagnoses:  Sepsis, due to unspecified organism, unspecified whether acute organ dysfunction present (HCC)  Weakness     Rx / DC Orders   ED Discharge Orders     None        Note:  This document was prepared using Dragon voice recognition software and may include unintentional dictation errors.   Ernest Ronal BRAVO, MD 05/01/24 1248

## 2024-05-01 NOTE — Hospital Course (Addendum)
 Mr. 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 Patient previously had a CBD stent, biliary drain 6/5, exchange of the drain 7/18.  Patient was also treated with ertapenem  7/7 to 7/14 for E. coli septicemia. For the past 2 weeks, patient had intermittent high fever, happens every 48 hours to 72 hours.  Temperature as high as 102. Initially treated with meropenem , culture came back resistant, changed to Zerbaxa  at discharge

## 2024-05-01 NOTE — Assessment & Plan Note (Signed)
 -  As needed nicotine patch ordered ?

## 2024-05-01 NOTE — Assessment & Plan Note (Signed)
 CBC in the AM

## 2024-05-01 NOTE — Progress Notes (Signed)
 CODE SEPSIS - PHARMACY COMMUNICATION  **Broad Spectrum Antibiotics should be administered within 1 hour of Sepsis diagnosis**  Time Code Sepsis Called/Page Received: 1245  Antibiotics Ordered: cefepime , metronidazole , and vancomycin   Time of 1st antibiotic administration: 1322  Additional action taken by pharmacy: None  If necessary, Name of Provider/Nurse Contacted: None    Damien Napoleon ,PharmD Clinical Pharmacist  05/01/2024  12:52 PM

## 2024-05-01 NOTE — Sepsis Progress Note (Signed)
 Elink following code sepsis

## 2024-05-01 NOTE — H&P (Signed)
 History and Physical   Casey Peters FMW:[id number] DOB: [birth year] DOA: 05/01/2024  PCP: Rudolpho Norleen BIRCH, MD  Outpatient Specialists: Dr. Rennie, oncology Patient coming from: home  I have personally briefly reviewed patient's old medical records in [facility] EMR.  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  Vitals in the ED showed temperature 99.6, respiration rate 20, heart rate 88, blood pressure 99/62, SpO2 98% on room air.  Serum sodium is 133, potassium 4.2, chloride 99, bicarb 24, BUN of 14, serum creatinine 0.99, EGFR greater than 60, nonfasting glucose 170, WBC 14.2, hemoglobin 12.5, platelets of 424.  Lactic acid 0.8.  ED treatment: Cefepime  and vancomycin  per pharmacy, metronidazole  500 mg IV one-time dose, LR 1 L bolus, LR infusion at 150 mm/h. ------------------------------------ At bedside, patient able to tell me his/name, age, location, current calendar year.  Patient reports he developed fever today and took a Tylenol .  He denies dysuria, hematuria, diarrhea, blood in his stool, chest pain, shortness of breath.  He lives at home with  He denies trauma to his person.  Patient has been compliant with home medications.  Social history: He lives at home with his wife.  He denies tobacco, EtOH, recreational drug use.  He is retired.  ROS: Constitutional: no weight change, + fever ENT/Mouth: no sore throat, no rhinorrhea Eyes: no eye pain, no vision changes Cardiovascular: no chest pain, no dyspnea,  no edema, no palpitations Respiratory: no cough, no sputum, no wheezing Gastrointestinal: no nausea, no vomiting, no diarrhea, no  constipation Genitourinary: no urinary incontinence, no dysuria, no hematuria Musculoskeletal: no arthralgias, no myalgias Skin: no skin lesions, no pruritus, Neuro: + weakness, no loss of consciousness, no syncope Psych: no anxiety, no depression, + decrease appetite Heme/Lymph: no bruising, no bleeding  ED Course: Discussed with EDP, patient requiring hospitalization for chief concerns of recurrent fever.  Assessment/Plan  Principal Problem:   Recurrent fever Active Problems:   HFrEF (heart failure with reduced ejection fraction) (HCC)   Type 2 diabetes mellitus (HCC)   Fever   CAD S/P percutaneous coronary angioplasty   HLD (hyperlipidemia)   Controlled type 2 diabetes mellitus without complication (HCC)   Tobacco use   Hypertriglyceridemia   Elevated liver enzymes   E coli bacteremia   Leukocytosis   Assessment and Plan:  * Recurrent fever Source is unclear at this time, etiology workup in progress Blood cultures x 2 are in process UA has been ordered and showed rare bacteria and negative for leukocytes and nitrate Stat portable chest x-ray ordered on admission Continue home meropenem   Type 2 diabetes mellitus (HCC) Insulin  SSI with at bedtime coverage ordered  Fever Query fever of unknown origin  Leukocytosis CBC in the AM  E coli bacteremia Completing meropenem  today Discussed with pharmacy  Tobacco use As needed nicotine  patch ordered  Controlled type 2 diabetes mellitus without complication (HCC) Home antiglycemic agent not resumed on admission Insulin  SSI with at bedtime coverage ordered  Chart reviewed.   DVT prophylaxis: Heparin  5000 units subcutaneous every 8 hours Code Status: Full code Diet: Heart healthy/carb modified Family Communication: Updated spouse at bedside Disposition Plan: Pending clinical course; poor prognosis Consults called: Pharmacy Admission status: Telemetry medical, inpatient   Past Medical History:  Diagnosis Date    Cardiomegaly    CHF (congestive heart failure) (HCC)    Constipation    Coronary artery disease    Diabetic polyneuropathy (HCC)    Dilated idiopathic cardiomyopathy (HCC)    Erectile dysfunction    HLD (hyperlipidemia)    HTN (hypertension)    Low testosterone     Palpitations    Pneumonia    T2DM (type 2 diabetes mellitus) (HCC)    Past Surgical History:  Procedure Laterality Date   APPENDECTOMY     CORONARY/GRAFT ACUTE MI REVASCULARIZATION N/A 12/27/2021   Procedure: Coronary/Graft Acute MI Revascularization;  Surgeon: Mady Bruckner, MD;  Location: [facility] INVASIVE CV LAB;  Service: Cardiovascular;  Laterality: N/A;   CYSTOSCOPY WITH URETHRAL DILATATION N/A 06/17/2021   Procedure: CYSTOSCOPY WITH URETHRAL DILATATION;  Surgeon: Twylla Glendia BROCKS, MD;  Location: [facility] ORS;  Service: Urology;  Laterality: N/A;  Optilume Balloon   ERCP N/A 03/15/2024   Procedure: ERCP, WITH INTERVENTION IF INDICATED;  Surgeon: Jinny Carmine, MD;  Location: [facility] ENDOSCOPY;  Service: Endoscopy;  Laterality: N/A;   ERCP N/A 04/17/2024   Procedure: ERCP, WITH INTERVENTION IF INDICATED;  Surgeon: Jinny Carmine, MD;  Location: [facility] ENDOSCOPY;  Service: Endoscopy;  Laterality: N/A;   HERNIA REPAIR  1998   IR BILIARY DRAIN PLACEMENT WITH CHOLANGIOGRAM  03/16/2024   IR EXCHANGE BILIARY DRAIN  04/28/2024   LEFT HEART CATH AND CORONARY ANGIOGRAPHY N/A 12/27/2021   Procedure: LEFT HEART CATH AND CORONARY ANGIOGRAPHY;  Surgeon: Mady Bruckner, MD;  Location: [facility] INVASIVE CV LAB;  Service: Cardiovascular;  Laterality: N/A;   PILONIDAL CYST EXCISION     TONSILLECTOMY     URETHRA SURGERY  1995   Social History:  reports that he has been smoking cigars. He has never used smokeless tobacco. He reports that he does not drink alcohol and does not use drugs.  Allergies  Allergen Reactions   Citalopram Nausea And Vomiting    Dizziness, unsteady, balanced issues   Sulfamethoxazole -Trimethoprim      Fatigue and muscle  ache Other reaction(s): Other (See Comments) Fatigue and muscle ache   Trimethoprim  Other (See Comments)    Fatigue, hypotension   Family History  Problem Relation Age of Onset   Hematuria Father    Kidney disease Neg Hx    Prostate cancer Neg Hx    Family history: Family history reviewed and not pertinent.  Prior to Admission medications   Medication Sig Start Date End Date Taking? Authorizing Provider  alum & mag hydroxide-simeth (MAALOX/MYLANTA) 200-200-20 MG/5ML suspension Take 15 mLs by mouth every 6 (six) hours as needed for indigestion or heartburn. 04/24/24  Yes Von Bellis, MD  aspirin  EC 81 MG tablet Take 81 mg by mouth daily.   Yes [provider]  carvedilol  (COREG ) 6.25 MG tablet Take 6.25 mg by mouth 2 (two) times daily with a meal.   Yes [provider]  ertapenem  (INVANZ ) IVPB Inject 1 g into the vein daily for 2 days. Indication:  E coli bacteremia and biliary obstruction First Dose: Yes Last Day of Therapy:  05/01/2024 Labs - Once weekly:  CBC/D and CMP Fax weekly lab results  promptly to [phone] Method of administration: Mini-Bag Plus / Gravity Method of administration may be changed at the discretion of home infusion pharmacist based upon assessment  of the patient and/or caregiver's ability to self-administer the medication ordered. Please pull midline at completion of IV antibiotics on 05/02/24 Call [phone] with any critical values or questions 04/29/24 05/01/24  Yes Caleen Qualia, MD  furosemide  (LASIX ) 40 MG tablet Take 40 mg by mouth daily as needed.   Yes [provider]  glimepiride (AMARYL) 2 MG tablet Take 2 mg by mouth daily as needed. 03/21/24 03/21/25 Yes [provider]  JANUVIA 50 MG tablet Take 1 tablet by mouth daily. 02/29/24 02/28/25 Yes [provider]  oxyCODONE  (OXY IR/ROXICODONE ) 5 MG immediate release tablet Take 1 tablet (5 mg total) by mouth every 8 (eight) hours as needed for severe pain  (pain score 7-10). 04/05/24  Yes Borders, Fonda SAUNDERS, NP  pantoprazole  (PROTONIX ) 40 MG tablet Take 1 tablet (40 mg total) by mouth 2 (two) times daily for 30 days, THEN 1 tablet (40 mg total) daily. 04/24/24 06/23/24 Yes Von Bellis, MD  silodosin  (RAPAFLO ) 8 MG CAPS capsule Take 1 capsule (8 mg total) by mouth daily with breakfast. 12/10/23  Yes Vaillancourt, Samantha, PA-C  spironolactone  (ALDACTONE ) 25 MG tablet Take 12.5 mg by mouth daily. 01/04/24  Yes [provider]  testosterone  (ANDROGEL ) 50 MG/5GM (1%) GEL Place 5 g onto the skin daily. Please confirm with your cardiologist before resuming 12/29/21  Yes Caleen Qualia, MD  Vitamin D , Ergocalciferol , (DRISDOL ) 1.25 MG (50000 UNIT) CAPS capsule Take 1 capsule (50,000 Units total) by mouth every 7 (seven) days. 04/26/24 07/25/24 Yes Von Bellis, MD  ondansetron  (ZOFRAN ) 8 MG tablet One pill every 8 hours as needed for nausea/vomitting. 03/28/24   Brahmanday, Govinda R, MD  senna (SENOKOT) 8.6 MG TABS tablet Take 1 tablet (8.6 mg total) by mouth daily as needed for mild constipation. Patient not taking: Reported on 05/01/2024 04/05/24   Sherleen Fonda SAUNDERS, NP   Physical Exam: Vitals:   05/01/24 1027 05/01/24 1029 05/01/24 1631 05/01/24 2007  BP:  99/62 118/61 125/65  Pulse:  88 81 86  Resp:  20 16 17   Temp:  99.6 F (37.6 C) 99.6 F (37.6 C) 99.1 F (37.3 C)  TempSrc:  Oral    SpO2:  96% 98% 96%  Weight: 81.4 kg     Height: 5' 11 (1.803 m)      Constitutional: appears cachectic, frail, weak, Eyes: PERRL, lids and conjunctivae normal ENMT: Mucous membranes are moist. Posterior pharynx clear of any exudate or lesions. Age-appropriate dentition. Hearing appropriate Neck: normal, supple, no masses, no thyromegaly Respiratory: clear to auscultation bilaterally, no wheezing, no crackles. Normal respiratory effort. No accessory muscle use.  Cardiovascular: Regular rate and rhythm, no murmurs / rubs / gallops. No extremity edema. 2+  pedal pulses. No carotid bruits.  Abdomen: no tenderness, no masses palpated, no hepatosplenomegaly. Bowel sounds positive.  Drain in place with bilious green fluid that appears clear with negative for pus Musculoskeletal: no clubbing / cyanosis. No joint deformity upper and lower extremities. Good ROM, no contractures, no atrophy. Normal muscle tone.  Skin: no rashes, lesions, ulcers. No induration Neurologic: Sensation intact. Strength 5/5 in all 4.  Psychiatric: Normal judgment and insight. Alert and oriented x 3.  Depressed mood.  Affect  EKG: independently reviewed, showing sinus rhythm with rate of 88, QTc 420  Chest x-ray on Admission: I personally reviewed and I agree with radiologist reading as below.  DG Chest Port 1 View Result Date: 05/01/2024 CLINICAL DATA:  Leukocytosis EXAM: PORTABLE CHEST 1 VIEW COMPARISON:  04/26/2024 FINDINGS: Mildly degraded exam due to AP portable technique and patient body habitus. Reverse apical lordotic positioning. Cardiomegaly accentuated by AP portable technique. No pleural effusion or pneumothorax. Low lung volumes with resultant  pulmonary interstitial prominence. Improved left lower lobe airspace disease. Possible left mid lung subtle pulmonary opacity. IMPRESSION: Improved left lower lobe airspace disease. Possible developing left mid lung atelectasis or infection. Decreased sensitivity and specificity exam due to technique related factors, as described above. Electronically Signed   By: Rockey Kilts M.D.   On: 05/01/2024 14:38   Labs on Admission: I have personally reviewed following labs CBC: Recent Labs  Lab 04/26/24 1602 04/27/24 0426 04/28/24 0527 05/01/24 1035  WBC 17.4* 13.2* 10.3 14.2*  NEUTROABS 14.7*  --   --   --   HGB 11.8* 11.0* 11.0* 12.5*  HCT 34.6* 32.9* 32.8* 36.9*  MCV 94.3 93.7 92.9 92.3  PLT 324 317 335 424*   Basic Metabolic Panel: Recent Labs  Lab 04/26/24 1602 04/27/24 0426 04/28/24 0527 05/01/24 1035  NA 137 135  137 133*  K 3.8 3.3* 3.5 4.2  CL 101 101 104 99  CO2 25 25 26 24   GLUCOSE 138* 116* 104* 170*  BUN 16 14 13 14   CREATININE 1.10 0.90 0.85 0.99  CALCIUM  8.0* 7.8* 8.0* 8.6*  MG  --  1.9  --   --    GFR: Estimated Creatinine Clearance: 67.6 mL/min (by C-G formula based on SCr of 0.99 mg/dL).  Liver Function Tests: Recent Labs  Lab 04/26/24 1602 04/27/24 0426 04/28/24 0527 05/01/24 1035  AST 33 21 18 19   ALT 38 34 27 21  ALKPHOS 177* 145* 123 130*  BILITOT 0.9 1.6* 1.0 1.2  PROT 6.2* 5.6* 5.7* 7.0  ALBUMIN 2.5* 2.3* 2.4* 2.8*   Coagulation Profile: Recent Labs  Lab 04/26/24 1602 04/27/24 0426 05/01/24 1035  INR 1.1 1.2 1.2   CBG: Recent Labs  Lab 04/27/24 2059 04/28/24 0754 04/28/24 0955 05/01/24 1652 05/01/24 2057  GLUCAP 114* 112* 113* 159* 123*   Urine analysis:    Component Value Date/Time   COLORURINE AMBER (A) 05/01/2024 1612   APPEARANCEUR HAZY (A) 05/01/2024 1612   APPEARANCEUR Clear 11/08/2023 1116   LABSPEC 1.025 05/01/2024 1612   PHURINE 5.0 05/01/2024 1612   GLUCOSEU NEGATIVE 05/01/2024 1612   HGBUR SMALL (A) 05/01/2024 1612   BILIRUBINUR NEGATIVE 05/01/2024 1612   BILIRUBINUR Negative 11/08/2023 1116   KETONESUR NEGATIVE 05/01/2024 1612   PROTEINUR NEGATIVE 05/01/2024 1612   UROBILINOGEN 0.2 09/17/2023 1505   NITRITE NEGATIVE 05/01/2024 1612   LEUKOCYTESUR NEGATIVE 05/01/2024 1612   CRITICAL CARE Performed by: Dr. Sherre  Total critical care time: 32 minutes  Critical care time was exclusive of separately billable procedures and treating other patients.  Critical care was necessary to treat or prevent imminent or life-threatening deterioration.  Critical care was time spent personally by me on the following activities: development of treatment plan with patient and spouse as well as nursing, discussions with consultants, evaluation of patient's response to treatment, examination of patient, obtaining history from patient or surrogate,  ordering and performing treatments and interventions, ordering and review of laboratory studies, ordering and review of radiographic studies, pulse oximetry and re-evaluation of patient's condition.  This document was prepared using Dragon Voice Recognition software and may include unintentional dictation errors.  Dr. Sherre [facility]  If 7PM-7AM, please contact overnight-coverage provider If 7AM-7PM, please contact day attending provider [email or web address]  05/01/2024, 9:06 PM

## 2024-05-01 NOTE — ED Triage Notes (Addendum)
 Pt to ED from home via [facility] for generalized weakness weeks, per first nurse note family told EMS they want pt placed in rehab facility. Pt states weak all over. Denies pain.   Pt has PICC line to L arm and drain for liver. Discharged a few days ago. Recent sepsis.

## 2024-05-01 NOTE — ED Notes (Signed)
 First set of cultures were obtained by Katheren, RN, 2nd set obtained by Gerard SAILOR, RN

## 2024-05-01 NOTE — Assessment & Plan Note (Addendum)
 Source is unclear at this time, etiology workup in progress Blood cultures x 2 are in process UA has been ordered and showed rare bacteria and negative for leukocytes and nitrate Stat portable chest x-ray ordered on admission Continue home meropenem 

## 2024-05-01 NOTE — ED Triage Notes (Signed)
 Arrived by [facility] From home with c/o increased weakness X several weeks. Family and patient want rehab placement.  Patient has drain going to liver.  Patient has left sided midline present  A&O x4 per EMS   EMS vitals: 108/65 b/p 90HR 98oral

## 2024-05-01 NOTE — Assessment & Plan Note (Signed)
 Completing meropenem  today Discussed with pharmacy

## 2024-05-01 NOTE — Assessment & Plan Note (Signed)
 Query fever of unknown origin

## 2024-05-01 NOTE — Assessment & Plan Note (Signed)
 Home antiglycemic agent not resumed on admission Insulin  SSI with at bedtime coverage ordered

## 2024-05-02 DIAGNOSIS — R531 Weakness: Secondary | ICD-10-CM

## 2024-05-02 DIAGNOSIS — A689 Relapsing fever, unspecified: Secondary | ICD-10-CM | POA: Diagnosis not present

## 2024-05-02 LAB — BASIC METABOLIC PANEL WITH GFR
Anion gap: 8 (ref 5–15)
BUN: 14 mg/dL (ref 8–23)
CO2: 26 mmol/L (ref 22–32)
Calcium: 8.4 mg/dL — ABNORMAL LOW (ref 8.9–10.3)
Chloride: 101 mmol/L (ref 98–111)
Creatinine, Ser: 0.94 mg/dL (ref 0.61–1.24)
GFR, Estimated: >60 mL/min (ref >60)
Glucose, Bld: 117 mg/dL — ABNORMAL HIGH (ref 70–99)
Potassium: 3.8 mmol/L (ref 3.5–5.1)
Sodium: 135 mmol/L (ref 135–145)

## 2024-05-02 LAB — CBC
HCT: 32.9 % — ABNORMAL LOW (ref 39.0–52.0)
Hemoglobin: 10.9 g/dL — ABNORMAL LOW (ref 13.0–17.0)
MCH: 30.7 pg (ref 26.0–34.0)
MCHC: 33.1 g/dL (ref 30.0–36.0)
MCV: 92.7 fL (ref 80.0–100.0)
Platelets: 340 K/uL (ref 150–400)
RBC: 3.55 MIL/uL — ABNORMAL LOW (ref 4.22–5.81)
RDW: 15 % (ref 11.5–15.5)
WBC: 9 K/uL (ref 4.0–10.5)
nRBC: 0 % (ref 0.0–0.2)

## 2024-05-02 LAB — GLUCOSE, CAPILLARY
Glucose-Capillary: 125 mg/dL — ABNORMAL HIGH (ref 70–99)
Glucose-Capillary: 170 mg/dL — ABNORMAL HIGH (ref 70–99)
Glucose-Capillary: 128 mg/dL — ABNORMAL HIGH (ref 70–99)
Glucose-Capillary: 125 mg/dL — ABNORMAL HIGH (ref 70–99)

## 2024-05-02 LAB — CYTOLOGY - NON PAP

## 2024-05-02 MED ORDER — ENOXAPARIN SODIUM 40 MG/0.4ML IJ SOSY
40.0000 mg | PREFILLED_SYRINGE | INTRAMUSCULAR | Status: DC
  Administered 2024-05-02 – 2024-05-08 (×7): 40 mg via SUBCUTANEOUS
  Filled 2024-05-02 (×7): qty 0.4

## 2024-05-02 MED ORDER — TESTOSTERONE 50 MG/5GM (1%) TD GEL
5.0000 g | Freq: Every day | TRANSDERMAL | Status: DC
  Administered 2024-05-02 – 2024-05-08 (×7): 5 g via TRANSDERMAL
  Filled 2024-05-02 (×7): qty 5

## 2024-05-02 MED ORDER — SILODOSIN 8 MG PO CAPS
8.0000 mg | ORAL_CAPSULE | Freq: Every day | ORAL | Status: DC
  Administered 2024-05-02 – 2024-05-09 (×8): 8 mg via ORAL
  Filled 2024-05-02 (×9): qty 1

## 2024-05-02 MED ORDER — CARVEDILOL 3.125 MG PO TABS
3.1250 mg | ORAL_TABLET | Freq: Two times a day (BID) | ORAL | Status: DC
  Filled 2024-05-02 (×8): qty 1

## 2024-05-02 NOTE — Care Management Obs Status (Signed)
 MEDICARE OBSERVATION STATUS NOTIFICATION   Patient Details  Name: Casey Peters MRN: [id number] Date of Birth: Mar 11, 1946   Medicare Observation Status Notification Given:  Yes    [name], LCSW 05/02/2024, 5:39 PM

## 2024-05-02 NOTE — Evaluation (Signed)
 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 PTA pt wife reports he has become weaker and sometimes requires assist to mobilize at home. He currently requires assist for ADL/IADL but was MODI-I for ADL prior to acute illnesses. Pt presents with deficits in strength, endurance, activity tolerance, balance affecting safe and optimal ADL completion. Pt is performing ADL below PLOF, will benefit from acute OT to address deficits and to facilitate return to PLOF. Pt is left in chair, all needs met.      If plan is discharge home, recommend the following:   A little help with walking and/or transfers;A little help with bathing/dressing/bathroom;Assist for transportation;Help with stairs or ramp for entrance;Assistance with cooking/housework     Functional Status Assessment   Patient has had a recent decline in their functional status and demonstrates the ability to make significant improvements in function in a reasonable and predictable amount of time.     Equipment Recommendations   Other (comment) (defer to next venue of care)     Recommendations for Other Services         Precautions/Restrictions   Precautions Precautions: Fall Recall of Precautions/Restrictions: Intact Restrictions Weight Bearing Restrictions Per Provider Order: No     Mobility Bed Mobility Overal bed  mobility: Needs Assistance Bed Mobility: Supine to Sit     Supine to sit: Contact guard, Min assist, HOB elevated, Used rails          Transfers Overall transfer level: Needs assistance Equipment used: Rolling walker (2 wheels) Transfers: Sit to/from Stand Sit to Stand: Min assist, Contact guard assist                  Balance Overall balance assessment: Needs assistance Sitting-balance support: Feet supported Sitting balance-Leahy Scale: Good     Standing balance support: Bilateral upper extremity supported, Reliant on assistive device for balance Standing balance-Leahy Scale: Fair                             ADL either performed or assessed with clinical judgement   ADL Overall ADL's : Needs assistance/impaired     Grooming: Contact guard assist;Standing Grooming Details (indicate cue type and reason): with RW at sink level                 Toilet Transfer: Minimal assistance;Rolling walker (2 wheels);Ambulation;Regular Toilet;Grab bars Toilet Transfer Details (indicate cue type and reason): intermittent vcs for technique Toileting- Clothing Manipulation and Hygiene: Contact guard assist;Minimal assistance;Sit to/from stand Toileting - Clothing Manipulation Details (indicate cue type and reason): BM on toilet     Functional mobility during ADLs: Contact guard assist;Minimal assistance;Rolling walker (2 wheels) (approx 15' two attempts)       Vision Patient Visual Report: No change from baseline       Perception         Praxis  Pertinent Vitals/Pain Pain Assessment Pain Assessment: No/denies pain     Extremity/Trunk Assessment Upper Extremity Assessment Upper Extremity Assessment: Generalized weakness   Lower Extremity Assessment Lower Extremity Assessment: Generalized weakness       Communication Communication Communication: No apparent difficulties   Cognition Arousal: Alert Behavior During Therapy: WFL for  tasks assessed/performed, Flat affect Cognition: No apparent impairments                               Following commands: Intact       Cueing  General Comments   Cueing Techniques: Verbal cues  vss on RA   Exercises Other Exercises Other Exercises: edu re: role of OT, role of rehab, discharge recommendations, DME for safe ADL/functional mobility   Shoulder Instructions      Home Living Family/patient expects to be discharged to:: Private residence Living Arrangements: Spouse/significant other Available Help at Discharge: Family Type of Home: House Home Access: Stairs to enter Secretary/administrator of Steps: 2   Home Layout: One level     Bathroom Shower/Tub: Producer, television/film/video: Standard     Home Equipment: Agricultural consultant (2 wheels);Toilet riser;Grab bars - tub/shower          Prior Functioning/Environment Prior Level of Function : Needs assist             Mobility Comments: amb with RW however wife reports physical abilities fluctuate and he will need assist with standing ADLs Comments: prior to ongoing medical issues, generally MODI-I with ADL;recently requires assist for ADL/IADL with fluctuating abilities due to strength deficits    OT Problem List: Decreased strength;Decreased activity tolerance;Impaired balance (sitting and/or standing)   OT Treatment/Interventions: Self-care/ADL training;Patient/family education;Therapeutic exercise      OT Goals(Current goals can be found in the care plan section)   Acute Rehab OT Goals Patient Stated Goal: improve function OT Goal Formulation: With patient/family Time For Goal Achievement: 05/16/24 Potential to Achieve Goals: Good ADL Goals Pt Will Perform Grooming: with modified independence;sitting;standing Pt Will Perform Lower Body Dressing: with supervision;sitting/lateral leans;sit to/from stand Pt Will Transfer to Toilet: with supervision;ambulating Pt Will Perform  Toileting - Clothing Manipulation and hygiene: with supervision;sit to/from stand;sitting/lateral leans   OT Frequency:       Co-evaluation PT/OT/SLP Co-Evaluation/Treatment: Yes Reason for Co-Treatment: For patient/therapist safety;To address functional/ADL transfers   OT goals addressed during session: ADL's and self-care      AM-PAC OT 6 Clicks Daily Activity     Outcome Measure Help from another person eating meals?: None Help from another person taking care of personal grooming?: None Help from another person toileting, which includes using toliet, bedpan, or urinal?: A Little Help from another person bathing (including washing, rinsing, drying)?: A Little Help from another person to put on and taking off regular upper body clothing?: A Little Help from another person to put on and taking off regular lower body clothing?: A Little 6 Click Score: 20   End of Session Equipment Utilized During Treatment: Rolling walker (2 wheels) Nurse Communication: Mobility status  Activity Tolerance: Patient tolerated treatment well Patient left: in chair;with call bell/phone within reach;with chair alarm set;with family/visitor present  OT Visit Diagnosis: Unsteadiness on feet (R26.81);Other abnormalities of gait and mobility (R26.89);Muscle weakness (generalized) (M62.81)                Time: 9059-8992 OT Time Calculation (min): 27 min Charges:  OT General Charges $OT  Visit: 1 Visit OT Evaluation $OT Eval Low Complexity: 1 Low  Therisa Sheffield, OTD OTR/L  05/02/24, 1:28 PM

## 2024-05-02 NOTE — Progress Notes (Signed)
  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  Of note, pt was hospitalized from 04/17/2024 to 04/24/2024 and discharged on IV ertapenem  for E coli bacteremia.  Pt then was hospitalized from 7/16 to 7/18 for malaise and fever and discharged again with Ertapenem  without finding additional infection.  Pt currently presented again for the same.   * Recurrent fever --per wife, pt was fever-free in the hospital, then developed fever at home.  Workup has not revealed an additional infection.  Pt has been on Ertapenem  at home. --avoid tylenol  and monitor for fever. --if fever is observed in the hospital, then will consult ID --hold further abx for now  E coli bacteremia --pos blood cx from 04/17/24.  ID was consulted.  Due to no oral option, pt was discharged on IV ertapenem  with end date of 05/01/24. --hold further abx  Type 2 diabetes mellitus (HCC) --recent A1c 6.9.   --d/c BG checks, no need  Tobacco use As needed nicotine  patch ordered  Weakness --pt and family desire SNF rehab --PT/OT   DVT prophylaxis: Lovenox  SQ Code Status: Full code  Family Communication: wife updated at bedside today Level of care: Med-Surg Dispo:   The patient is from: home Anticipated d/c is to: SNF rehab Anticipated d/c date is: 1-2 days   Subjective and Interval History:  No fever since presentation. Pt reported  feeling weak and tired.  ROS grossly neg.  Having normal urination and BM.  When asked about swallowing, pt did admit to difficulty swallowing sometimes, but no obvious aspiration.   Objective: Vitals:   05/01/24 2007 05/02/24 0723 05/02/24 1409 05/02/24 1741  BP: 125/65 120/72 (!) 100/56 102/70  Pulse: 86 72 66 70  Resp: 17 17 16    Temp: 99.1 F (37.3 C) 98.4 F (36.9 C) 98 F (36.7 C)   TempSrc:  Oral    SpO2: 96% 97% 97%   Weight:      Height:        Intake/Output Summary (Last 24 hours) at 05/02/2024 2042 Last data filed at 05/02/2024 1540 Gross per 24 hour  Intake 680 ml  Output 850 ml  Net -170 ml   Filed Weights   05/01/24 1027  Weight: 81.4 kg    Examination:   Constitutional: NAD, AAOx3 HEENT: conjunctivae and lids normal, EOMI CV: No cyanosis.   RESP: normal respiratory effort, on RA Abdomen: chole tube with yellow-green output Psych: subdued mood and affect.     Data Reviewed: I have personally reviewed labs and imaging studies  Time spent: 50 minutes  Ellouise Haber, MD [facility] If 7PM-7AM, please contact night-coverage 05/02/2024, 8:42 PM

## 2024-05-02 NOTE — Evaluation (Signed)
 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 PTA, patient lives with wife and has been using RW for mobility but having fluctuating bouts of weakness resulting in wife having difficulty caring for him. Patient presents with weakness, impaired balance, and decreased activity tolerance. Able to complete bed mobility with CGA. Stood from EOB with CGA-minA and RW, cues for hand placement. Ambulated to/from bathroom with RW and CGA. Instructed patient through seated LE exercises for strength with good follow through. Patient will benefit from skilled PT services during acute stay to address listed deficits. Patient will benefit from ongoing therapy at discharge to maximize functional independence and safety.         If plan is discharge home, recommend the following: A little help with walking and/or transfers;A little help with bathing/dressing/bathroom;Assistance with cooking/housework;Help with stairs or ramp for entrance;Assist for transportation   Can travel by private vehicle   No    Equipment Recommendations Wheelchair (measurements PT);Wheelchair cushion (measurements PT)  Recommendations for Other Services       Functional Status Assessment Patient has had a recent decline in their functional status and demonstrates the ability to make significant improvements in function in a reasonable and predictable amount of time.     Precautions / Restrictions  Precautions Precautions: Fall Recall of Precautions/Restrictions: Intact Restrictions Weight Bearing Restrictions Per Provider Order: No      Mobility  Bed Mobility Overal bed mobility: Needs Assistance Bed Mobility: Supine to Sit     Supine to sit: Contact guard          Transfers Overall transfer level: Needs assistance Equipment used: Rolling Amorie Rentz (2 wheels) Transfers: Sit to/from Stand Sit to Stand: Contact guard assist, Min assist                Ambulation/Gait Ambulation/Gait assistance: Contact guard assist Gait Distance (Feet): 20 Feet (+20) Assistive device: Rolling Kyrel Leighton (2 wheels) Gait Pattern/deviations: Step-through pattern, Decreased stride length Gait velocity: decreased     General Gait Details: CGA for safety  Stairs            Wheelchair Mobility     Tilt Bed    Modified Rankin (Stroke Patients Only)       Balance Overall balance assessment: Needs assistance Sitting-balance support: Feet supported Sitting balance-Leahy Scale: Normal     Standing balance support: Bilateral upper extremity supported, Reliant on assistive device for balance Standing balance-Leahy Scale: Fair                               Pertinent Vitals/Pain Pain Assessment Pain Assessment: No/denies pain    Home Living Family/patient expects to be discharged to:: Private residence Living Arrangements: Spouse/significant other Available Help at Discharge: Family Type of Home: House Home Access: Stairs to enter   Secretary/administrator of Steps: 2   Home Layout: One level Home Equipment: Agricultural consultant (2 wheels);Toilet riser;Grab bars - tub/shower      Prior Function Prior Level of  Function : Needs assist             Mobility Comments: amb with RW however wife reports physical abilities fluctuate and he will need assist with standing ADLs Comments: prior to ongoing medical issues, generally MODI-I with ADL;recently requires  assist for ADL/IADL with fluctuating abilities due to strength deficits     Extremity/Trunk Assessment   Upper Extremity Assessment Upper Extremity Assessment: Generalized weakness    Lower Extremity Assessment Lower Extremity Assessment: Generalized weakness       Communication   Communication Communication: No apparent difficulties    Cognition Arousal: Alert Behavior During Therapy: Flat affect   PT - Cognitive impairments: No apparent impairments                         Following commands: Intact       Cueing       General Comments General comments (skin integrity, edema, etc.): vss on RA    Exercises General Exercises - Lower Extremity Long Arc Quad: Both, 10 reps, Seated Hip Flexion/Marching: Both, 10 reps, Seated Other Exercises Other Exercises: seated heel/toe raises x 10 each LE   Assessment/Plan    PT Assessment Patient needs continued PT services  PT Problem List Decreased strength;Decreased range of motion;Decreased activity tolerance;Decreased balance;Decreased mobility;Decreased coordination;Decreased knowledge of use of DME;Decreased safety awareness       PT Treatment Interventions DME instruction;Gait training;Functional mobility training;Therapeutic activities;Therapeutic exercise;Balance training;Patient/family education    PT Goals (Current goals can be found in the Care Plan section)  Acute Rehab PT Goals Patient Stated Goal: to get stronger PT Goal Formulation: With patient/family Time For Goal Achievement: 05/16/24 Potential to Achieve Goals: Good    Frequency Min 2X/week     Co-evaluation   Reason for Co-Treatment: For patient/therapist safety;To address functional/ADL transfers   OT goals addressed during session: ADL's and self-care       AM-PAC PT 6 Clicks Mobility  Outcome Measure Help needed turning from your back to your side while in a flat bed without using bedrails?: A Little Help needed moving from  lying on your back to sitting on the side of a flat bed without using bedrails?: A Little Help needed moving to and from a bed to a chair (including a wheelchair)?: A Little Help needed standing up from a chair using your arms (e.g., wheelchair or bedside chair)?: A Little Help needed to walk in hospital room?: A Little Help needed climbing 3-5 steps with a railing? : A Lot 6 Click Score: 17    End of Session   Activity Tolerance: Patient tolerated treatment well Patient left: in chair;with call bell/phone within reach;with chair alarm set;with family/visitor present Nurse Communication: Mobility status PT Visit Diagnosis: Unsteadiness on feet (R26.81);Muscle weakness (generalized) (M62.81);History of falling (Z91.81);Other abnormalities of gait and mobility (R26.89)    Time: 9058-8992 PT Time Calculation (min) (ACUTE ONLY): 26 min   Charges:   PT Evaluation $PT Eval Moderate Complexity: 1 Mod   PT General Charges $$ ACUTE PT VISIT: 1 Visit         Maryanne Finder, PT, DPT Physical Therapist - [facility]  [facility]   Casey Peters A Massimo Hartland 05/02/2024, 1:22 PM

## 2024-05-02 NOTE — Plan of Care (Signed)
  Problem: Coping: Goal: Ability to adjust to condition or change in health will improve Outcome: Progressing   Problem: Clinical Measurements: Goal: Diagnostic test results will improve Outcome: Progressing   Problem: Nutrition: Goal: Adequate nutrition will be maintained Outcome: Progressing   Problem: Safety: Goal: Ability to remain free from injury will improve Outcome: Progressing

## 2024-05-02 NOTE — Care Management CC44 (Signed)
 Condition Code 44 Documentation Completed  Patient Details  Name: Casey Peters MRN: [id number] Date of Birth: 1946-06-09   Condition Code 44 given:  Yes Patient signature on Condition Code 44 notice:  Yes Documentation of 2 MD's agreement:  Yes Code 44 added to claim:  Yes    [name], LCSW 05/02/2024, 5:39 PM

## 2024-05-03 DIAGNOSIS — R7881 Bacteremia: Secondary | ICD-10-CM | POA: Diagnosis not present

## 2024-05-03 DIAGNOSIS — E119 Type 2 diabetes mellitus without complications: Secondary | ICD-10-CM

## 2024-05-03 DIAGNOSIS — B9620 Unspecified Escherichia coli [E. coli] as the cause of diseases classified elsewhere: Secondary | ICD-10-CM

## 2024-05-03 DIAGNOSIS — A689 Relapsing fever, unspecified: Secondary | ICD-10-CM | POA: Diagnosis not present

## 2024-05-03 LAB — GLUCOSE, CAPILLARY: Glucose-Capillary: 158 mg/dL — ABNORMAL HIGH (ref 70–99)

## 2024-05-03 MED ORDER — POLYETHYLENE GLYCOL 3350 17 G PO PACK
17.0000 g | PACK | Freq: Every day | ORAL | Status: DC
  Filled 2024-05-03 (×5): qty 1

## 2024-05-03 MED ORDER — SUCRALFATE 1 GM/10ML PO SUSP
1.0000 g | Freq: Three times a day (TID) | ORAL | Status: DC
  Administered 2024-05-03 – 2024-05-09 (×19): 1 g via ORAL
  Filled 2024-05-03 (×21): qty 10

## 2024-05-03 NOTE — TOC Initial Note (Addendum)
 Transition of Care [facility]) - Initial/Assessment Note    Patient Details  Name: Casey Peters MRN: [id number] Date of Birth: [birth year]  [facility] CM/SW Contact:    Elouise LULLA Capri, RN 05/03/2024, 10:56 AM  Clinical Narrative:                  CM to patient's room regarding case management screening assessment. Patient lives with patient's wife, Clarita. No previous home health /SNF experience. Per patient's wife, patient uses a walker prior to hospitalization. CM and patient and patient's wife, discussed SNF recommendations. Per patient and patient's wife, declined SNF and prefers home health. Per patient's wife, will provide transportation and caregiver support.     CM call to Darleene Hedda Corean Salome, phone: [phone] regarding home health services. Per Darleene [facility] is active in home will continue to follow through discharge.  Barriers to Discharge: Continued Medical Work up   Patient Goals and CMS Choice    SNF     Expected Discharge Plan and Services    Home health  Prior Living Arrangements/Services   Lives with:: Self Patient language and need for interpreter reviewed:: No Do you feel safe going back to the place where you live?: Yes      Need for Family Participation in Patient Care: Yes (Comment) Care giver support system in place?: Yes (comment) Current home services: DME (rolling walker) Criminal Activity/Legal Involvement Pertinent to Current Situation/Hospitalization: No - Comment as needed  Activities of Daily Living      Permission Sought/Granted Permission sought to share information with : Case Manager, Family Supports Permission granted to share information with : Yes, Verbal Permission Granted  Share Information with NAME: Mussa Groesbeck     Permission granted to share info w Relationship: wife     Emotional Assessment Appearance:: Appears stated age Attitude/Demeanor/Rapport: Engaged Affect (typically  observed): Calm Orientation: : Oriented to Self, Oriented to  Time, Oriented to Place Alcohol / Substance Use: Tobacco Use (Per patient's wife, last cigar was Sunday07/20/2025)    Admission diagnosis:  Recurrent fever [A68.9] Weakness [R53.1] Sepsis, due to unspecified organism, unspecified whether acute organ dysfunction present [facility]) [A41.9] Patient Active Problem List   Diagnosis Date Noted   Weakness 05/02/2024   Recurrent fever 05/01/2024   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   Sepsis (HCC) 04/27/2024   CAD S/P percutaneous coronary angioplasty 04/27/2024   Cardiomegaly    Fever 04/26/2024   Leukocytosis 04/26/2024   E coli bacteremia 04/19/2024   Severe sepsis (HCC) 04/18/2024   Acute respiratory failure with hypoxia (HCC) 04/18/2024   HCAP (healthcare-associated pneumonia) 04/18/2024   AKI (acute kidney injury) (HCC) 04/18/2024   Septic shock (HCC) 04/18/2024   Biliary obstruction 04/17/2024   Elevated tumor markers 03/28/2024   Elevated liver enzymes 03/17/2024   Painless jaundice 03/14/2024   Shoulder pain 12/28/2021   STEMI (ST elevation myocardial infarction) (HCC) 12/27/2021   STEMI involving right coronary artery (HCC) 12/27/2021   S/P cardiac catheterization 12/27/2021   Dilated idiopathic cardiomyopathy (HCC)    Hypertriglyceridemia    HFrEF (heart failure with reduced ejection fraction) (HCC)    Palpitations 02/08/2020   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   Pain of finger 06/29/2019   Chronic systolic heart failure (HCC) 10/25/2018   Tobacco use 10/25/2018   Rising PSA level 07/10/2015   Microscopic hematuria 07/10/2015   Type 2 diabetes mellitus (HCC) 06/08/2014   HLD (hyperlipidemia) 06/08/2014   BP (high blood pressure) 06/08/2014   Obesity, diabetes,  and hypertension syndrome (HCC) 06/08/2014   Adiposity 06/08/2014   Controlled type 2 diabetes mellitus without complication (HCC) 06/08/2014   PCP:  Rudolpho Norleen BIRCH, MD Pharmacy:   [facility] -  [place], [place] - [address] [place] [address] Phone: [phone] Fax: [phone]     Social Drivers of Health (SDOH) Social History: SDOH Screenings   Food Insecurity: No Food Insecurity (05/01/2024)  Housing: Low Risk  (05/01/2024)  Transportation Needs: No Transportation Needs (05/01/2024)  Utilities: Not At Risk (05/01/2024)  Depression (PHQ2-9): Low Risk  (03/28/2024)  Social Connections: Socially Integrated (05/01/2024)  Tobacco Use: High Risk (05/01/2024)   SDOH Interventions:     Readmission Risk Interventions     No data to display

## 2024-05-03 NOTE — Progress Notes (Addendum)
 Progress Note   Patient: Casey Peters FMW:[id number] DOB: [birth year] DOA: 05/01/2024     1 DOS: the patient was seen and examined on 05/03/2024   Brief hospital course: Mr. 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 Patient previously had a CBD stent, biliary drain 6/5, exchange of the drain 7/18.  Patient was also treated with ertapenem  7/7 to 7/14 for E. coli septicemia. For the past 2 weeks, patient had intermittent high fever, happens every 48 hours to 72 hours.  Temperature as high as 102.    Principal Problem:   Recurrent fever Active Problems:   HFrEF (heart failure with reduced ejection fraction) (HCC)   Type 2 diabetes mellitus (HCC)   Fever   CAD S/P percutaneous coronary angioplasty   HLD (hyperlipidemia)   Controlled type 2 diabetes mellitus without complication (HCC)   Tobacco use   Hypertriglyceridemia   Elevated liver enzymes   E coli bacteremia   Leukocytosis   Weakness   Assessment and Plan: * Recurrent fever Etiology still unclear.  Appears that the patient has intermittent fever for the last 2 or 3 weeks, he just finished the ertapenem  for E. coli bacteremia. Today, UA does not support UTI, chest x-ray did not show any aspiration pneumonia, patient has no history of aspiration. Patient has no diarrhea. No skin cellulitis. Recent echocardiogram from 04/18/2024 showed ejection fraction 50 to 55%, no evidence of vegetation. Since patient had intermittent fever for the last 2 or 3 weeks, I will try to establish diagnosis.  Blood cultures in progress no growth I will send a culture from the biliary drain.  If this is negative, fever could be caused by PICC line.  Will  remove PICC line if culture from the biliary drain is negative. Another possibility is malignancy related.  Chronic abdominal pain. .  To be secondary to gastritis with constipation.  Patient is taking Protonix , will add sucralfate .  Also started stool softener.  E coli bacteremia Antibiotics has completed  Tobacco use As needed nicotine  patch ordered  Controlled type 2 diabetes mellitus without complication (HCC) Continue sliding scale insulin .  Weakness. He was seen by PT/OT, recommended nursing home placement.  Discussed with the patient and the wife, they do not accept nursing home, will try discharge to home with home health.       Subjective:  Patient complaining of upper stomach pain, no nausea vomiting.  Has some constipation   Physical Exam: Vitals:   05/02/24 1741 05/02/24 2131 05/03/24 0301 05/03/24 0732  BP: 102/70 (!) 109/58 110/62 110/64  Pulse: 70 73 74 76  Resp:  17 18 16   Temp:  98.3 F (36.8 C) 98.4 F (36.9 C) 98.8 F (37.1 C)  TempSrc:  Oral    SpO2:  96% 97% 97%  Weight:      Height:       General exam: Appears calm and comfortable  Respiratory system: Clear to auscultation. Respiratory effort normal. Cardiovascular system: S1 & S2 heard, RRR. No JVD, murmurs, rubs, gallops or clicks. No pedal edema. Gastrointestinal system: Abdomen is nondistended, soft and abdominal tender. No organomegaly or masses felt. Normal bowel sounds heard. Central nervous system: Alert and oriented. No focal neurological deficits. Extremities: Symmetric 5 x 5 power. Skin: No rashes, lesions or  ulcers Psychiatry: Judgement and insight appear normal. Mood & affect appropriate.    Data Reviewed:  Lab results reviewed.  Family Communication: Wife updated at bedside.  Disposition: Status is: Observation      Time spent: 50 minutes  Author: Murvin Mana, MD 05/03/2024 1:26 PM  For on call review [email or web address].

## 2024-05-03 NOTE — Plan of Care (Signed)

## 2024-05-04 DIAGNOSIS — E119 Type 2 diabetes mellitus without complications: Secondary | ICD-10-CM | POA: Diagnosis not present

## 2024-05-04 DIAGNOSIS — B9620 Unspecified Escherichia coli [E. coli] as the cause of diseases classified elsewhere: Secondary | ICD-10-CM | POA: Diagnosis not present

## 2024-05-04 DIAGNOSIS — R7881 Bacteremia: Secondary | ICD-10-CM | POA: Diagnosis not present

## 2024-05-04 DIAGNOSIS — A689 Relapsing fever, unspecified: Secondary | ICD-10-CM | POA: Diagnosis not present

## 2024-05-04 LAB — GLUCOSE, CAPILLARY
Glucose-Capillary: 115 mg/dL — ABNORMAL HIGH (ref 70–99)
Glucose-Capillary: 136 mg/dL — ABNORMAL HIGH (ref 70–99)
Glucose-Capillary: 171 mg/dL — ABNORMAL HIGH (ref 70–99)
Glucose-Capillary: 166 mg/dL — ABNORMAL HIGH (ref 70–99)
Glucose-Capillary: 140 mg/dL — ABNORMAL HIGH (ref 70–99)

## 2024-05-04 MED ORDER — INSULIN ASPART 100 UNIT/ML IJ SOLN
0.0000 [IU] | Freq: Three times a day (TID) | INTRAMUSCULAR | Status: DC
  Administered 2024-05-04 – 2024-05-07 (×6): 1 [IU] via SUBCUTANEOUS
  Filled 2024-05-04 (×7): qty 1

## 2024-05-04 NOTE — Progress Notes (Signed)
 Progress Note   Patient: Casey Peters FMW:[id number] DOB: 25-Feb-1946 DOA: 05/01/2024     1 DOS: the patient was seen and examined on 05/04/2024   Brief hospital course: Mr. 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 Patient previously had a CBD stent, biliary drain 6/5, exchange of the drain 7/18.  Patient was also treated with ertapenem  7/7 to 7/14 for E. coli septicemia. For the past 2 weeks, patient had intermittent high fever, happens every 48 hours to 72 hours.  Temperature as high as 102.    Principal Problem:   Recurrent fever Active Problems:   HFrEF (heart failure with reduced ejection fraction) (HCC)   Type 2 diabetes mellitus (HCC)   Fever   CAD S/P percutaneous coronary angioplasty   HLD (hyperlipidemia)   Controlled type 2 diabetes mellitus without complication (HCC)   Tobacco use   Hypertriglyceridemia   Elevated liver enzymes   E coli bacteremia   Leukocytosis   Weakness   Assessment and Plan:  Recurrent fever 7/23. Etiology still unclear.  Appears that the patient has intermittent fever for the last 2 or 3 weeks, he just finished the ertapenem  for E. coli bacteremia. Today, UA does not support UTI, chest x-ray did not show any aspiration pneumonia, patient has no history of aspiration. Patient has no diarrhea. No skin cellulitis. Recent echocardiogram from 04/18/2024 showed ejection fraction 50 to 55%, no evidence of vegetation. Since patient had intermittent fever for the last 2 or 3 weeks, I will try to establish diagnosis.  Blood cultures in progress no growth I will send a culture from the biliary drain.  If this is negative, fever could be caused by PICC line.   Will remove PICC line if culture from the biliary drain is negative. Another possibility is malignancy related. 7/24.  Culture from bile drain still pending, Gram stain has some bacteria but culture does not seem to grow out.  Continue to culture.   Chronic abdominal pain. .  To be secondary to gastritis with constipation.  Patient is taking Protonix , will add sucralfate .  Also started stool softener. Had a bowel movement yesterday evening.  Still has intermittent abdominal pain.  Appears to be secondary to biliary drain.   E coli bacteremia Antibiotics has completed   Tobacco use As needed nicotine  patch ordered   Controlled type 2 diabetes mellitus without complication (HCC) Continue sliding scale insulin .   Weakness. He was seen by PT/OT, recommended nursing home placement.  Discussed with the patient and the wife, they do not accept nursing home, will try discharge to home with home health.        Subjective:  Patient has subjective fever, but no objective fever.  Appetite seem to be improving  Physical Exam: Vitals:   05/03/24 1634 05/03/24 2051 05/04/24 0515 05/04/24 0749  BP: 108/77 120/69 117/74 115/66  Pulse: 76 76 75 71  Resp:  18 16 16   Temp: 98.2 F (36.8 C) 98.2 F (36.8 C) 98 F (36.7 C) 98.3 F (36.8 C)  TempSrc: Oral Oral  Oral  SpO2: 96% 95% 95% 96%  Weight:      Height:       General exam: Appears calm and comfortable  Respiratory system: Clear to auscultation. Respiratory effort normal. Cardiovascular system:  S1 & S2 heard, RRR. No JVD, murmurs, rubs, gallops or clicks. No pedal edema. Gastrointestinal system: Abdomen is nondistended, soft and nontender. No organomegaly or masses felt. Normal bowel sounds heard. Central nervous system: Alert and oriented. No focal neurological deficits. Extremities: Symmetric 5 x 5 power. Skin: No rashes, lesions or ulcers Psychiatry: Judgement and insight appear normal. Mood & affect appropriate.    Data  Reviewed:  Lab results reviewed.  Family Communication: Wife Updated at bedside.  Disposition: Status is: Observation      Time spent: 35 minutes  Author: Murvin Mana, MD 05/04/2024 1:16 PM  For on call review [email or web address].

## 2024-05-04 NOTE — Plan of Care (Signed)

## 2024-05-04 NOTE — Progress Notes (Signed)
 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 Upon arrival to room pt in bed, agreeable to tx. Pt requires SUPERVISION for bed mobility; CGA + RW for STS. Pt ambulated to bathroom requiring CGA and SUPERVISION for toileting; ambulated ~50 ft in hallway reporting fatigue. Pt making good progress toward goals, will continue to follow POC. Discharge recommendation remains appropriate.        If plan is discharge home, recommend the following:  A little help with walking and/or transfers;A little help with bathing/dressing/bathroom;Assist for transportation;Help with stairs or ramp for entrance;Assistance with cooking/housework   Equipment Recommendations  Other (comment)    Recommendations for Other Services      Precautions / Restrictions Precautions Precautions: Fall Recall of Precautions/Restrictions: Intact Restrictions Weight Bearing Restrictions Per Provider Order: No       Mobility Bed Mobility Overal bed mobility: Needs Assistance Bed Mobility: Supine to Sit     Supine to sit: Supervision          Transfers Overall transfer level: Needs assistance Equipment used: Rolling walker (2 wheels) Transfers: Sit to/from Stand Sit to Stand: Contact guard assist                 Balance Overall balance assessment: Needs assistance Sitting-balance support: Feet supported Sitting balance-Leahy Scale: Good     Standing  balance support: Bilateral upper extremity supported, Reliant on assistive device for balance Standing balance-Leahy Scale: Fair                             ADL either performed or assessed with clinical judgement   ADL Overall ADL's : Needs assistance/impaired                                       General ADL Comments: SUPERVISION for toileting    Extremity/Trunk Assessment Upper Extremity Assessment Upper Extremity Assessment: Generalized weakness   Lower Extremity Assessment Lower Extremity Assessment: Generalized weakness        Vision       Perception     Praxis     Communication Communication Communication: No apparent difficulties   Cognition Arousal: Alert Behavior During Therapy: WFL for tasks assessed/performed, Flat affect Cognition: No apparent impairments                               Following commands: Intact        Cueing   Cueing Techniques: Verbal cues, Gestural cues  Exercises      Shoulder Instructions       General Comments      Pertinent Vitals/ Pain       Pain Assessment Pain Assessment: 0-10 Pain Score: 2  Pain Location: abdomen/drain area Pain Descriptors / Indicators: Discomfort Pain Intervention(s): Limited activity within patient's tolerance, Monitored during session, Repositioned  Home Living                                          Prior Functioning/Environment              Frequency  Min 2X/week        Progress Toward Goals  OT Goals(current goals can now be found in the care plan section)  Progress towards OT goals: Progressing toward goals  Acute Rehab OT Goals Patient Stated Goal: to feel better OT Goal Formulation: With patient Time For Goal Achievement: 05/18/24 Potential to Achieve Goals: Good ADL Goals Pt Will Perform Grooming: with modified independence;sitting;standing Pt Will Perform Lower Body Dressing: with  supervision;sitting/lateral leans;sit to/from stand Pt Will Transfer to Toilet: with supervision;ambulating Pt Will Perform Toileting - Clothing Manipulation and hygiene: with supervision;sit to/from stand;sitting/lateral leans Additional ADL Goal #1: Pt will demonstrate standing tolerance of > 10 min. for engagement in ADL/IADL task.  Plan      Co-evaluation                 AM-PAC OT 6 Clicks Daily Activity     Outcome Measure   Help from another person eating meals?: None Help from another person taking care of personal grooming?: None Help from another person toileting, which includes using toliet, bedpan, or urinal?: A Little Help from another person bathing (including washing, rinsing, drying)?: A Little Help from another person to put on and taking off regular upper body clothing?: A Little Help from another person to put on and taking off regular lower body clothing?: A Lot 6 Click Score: 19    End of Session Equipment Utilized During Treatment: Gait belt;Rolling walker (2 wheels)  OT Visit Diagnosis: Unsteadiness on feet (R26.81);Other abnormalities of gait and mobility (R26.89);Muscle weakness (generalized) (M62.81)   Activity Tolerance Patient tolerated treatment well   Patient Left in chair;with call bell/phone within reach;with chair alarm set   Nurse Communication          Time: 8875-8856 OT Time Calculation (min): 19 min  Charges: OT General Charges $OT Visit: 1 Visit OT Treatments $Therapeutic Activity: 8-22 mins  [place], Student OT   [employer] 05/04/2024, 1:17 PM

## 2024-05-04 NOTE — Plan of Care (Signed)

## 2024-05-05 DIAGNOSIS — I5020 Unspecified systolic (congestive) heart failure: Secondary | ICD-10-CM | POA: Diagnosis not present

## 2024-05-05 DIAGNOSIS — R7881 Bacteremia: Secondary | ICD-10-CM | POA: Diagnosis not present

## 2024-05-05 DIAGNOSIS — B952 Enterococcus as the cause of diseases classified elsewhere: Secondary | ICD-10-CM

## 2024-05-05 DIAGNOSIS — Z1620 Resistance to unspecified antibiotic: Secondary | ICD-10-CM

## 2024-05-05 DIAGNOSIS — B965 Pseudomonas (aeruginosa) (mallei) (pseudomallei) as the cause of diseases classified elsewhere: Secondary | ICD-10-CM

## 2024-05-05 DIAGNOSIS — K831 Obstruction of bile duct: Secondary | ICD-10-CM

## 2024-05-05 DIAGNOSIS — B9620 Unspecified Escherichia coli [E. coli] as the cause of diseases classified elsewhere: Secondary | ICD-10-CM | POA: Diagnosis not present

## 2024-05-05 DIAGNOSIS — A689 Relapsing fever, unspecified: Secondary | ICD-10-CM | POA: Diagnosis not present

## 2024-05-05 LAB — GLUCOSE, CAPILLARY
Glucose-Capillary: 132 mg/dL — ABNORMAL HIGH (ref 70–99)
Glucose-Capillary: 179 mg/dL — ABNORMAL HIGH (ref 70–99)
Glucose-Capillary: 166 mg/dL — ABNORMAL HIGH (ref 70–99)
Glucose-Capillary: 154 mg/dL — ABNORMAL HIGH (ref 70–99)
Glucose-Capillary: 154 mg/dL — ABNORMAL HIGH (ref 70–99)

## 2024-05-05 MED ORDER — SODIUM CHLORIDE 0.9 % IV SOLN
1.0000 g | Freq: Three times a day (TID) | INTRAVENOUS | Status: DC
  Administered 2024-05-05 – 2024-05-08 (×9): 1 g via INTRAVENOUS
  Filled 2024-05-05 (×10): qty 20

## 2024-05-05 NOTE — Consult Note (Signed)
 NAME: Casey Peters  DOB: [birth year]  MRN: [id number]  Date/Time: 05/05/2024 12:34 PM  REQUESTING PROVIDER: Dr.Zhang Subjective:  REASON FOR CONSULT: biliary infection ?here with fever 4th admission in the past 2 months Casey Peters is a 78 y.o. 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  Cells were sent for cytology and the upper third of the main biliary duct and a plastic stent was placed.  The pathology of the brushings  showed atypical cells and malignancy could not be established.  He also had to undergo an internal/external biliary stent placement because of persistently high bilirubin on 03/16/24- no culture was sent from the fluid at that time. Patient  had a high CA 19.  He had seen our oncologist Patient on 04/17/2024 underwent ERCP and stent replacement with repeat brushings sent for cytology. After that he had a fever and and was hospitalized 7/7-7/14. I saw him that admission -HE  Had ecoli bacteremia. Discharged on IV ertapenem  until 05/01/24 Readmitted on 04/26/24 for fever , CT abdomen on 7/17  Stable positioning of right-sided internal/external biliary drainage catheter. No evidence of biliary obstruction with decompressed right-sided bile ducts and chronic atrophy of the left lobe. 2. The previously noted old endoscopic biliary stent has migrated further and is now almost entirely in the duodenum with a short segment remaining in the distal CBD . He  had the biliary drain exchanged on 7/18 and brushings sent- no biliary fluid sent for culture - blood culture was sent and it negative Discharged on 7/18 and asked to continue ertapenem   Readmitted on 7/21 with fever-  completed ertapenem  on 7/22 Blood culture negative so far Biliary fluid from the drain was sent and it  is pseudomonas and enterococcus. Started on Meropenem   I am seeing him for the same  No pain abdomen now Has resolved No nausea or vomiting No fever now      Steroid/immune suppressants/splenectomy/Hardware Recent Procedure Surgery Injections Trauma Sick contacts Travel Antibiotic use Food- raw/exotic Animal bites Tick exposure Water  sports Fishing/hunting/animal bird exposure Past Medical History:  Diagnosis Date   Cardiomegaly    CHF (congestive heart failure) (HCC)    Constipation    Coronary artery disease    Diabetic polyneuropathy (HCC)    Dilated idiopathic cardiomyopathy (HCC)    Erectile dysfunction    HLD (hyperlipidemia)    HTN (hypertension)    Low testosterone     Palpitations    Pneumonia    T2DM (type 2 diabetes mellitus) (HCC)     Past Surgical History:  Procedure Laterality Date   APPENDECTOMY     CORONARY/GRAFT ACUTE MI REVASCULARIZATION N/A 12/27/2021   Procedure: Coronary/Graft Acute MI Revascularization;  Surgeon: Mady Bruckner, MD;  Location: [facility]IVE [facility];  Service: Cardiovascular;  Laterality: N/A;   CYSTOSCOPY WITH URETHRAL DILATATION N/A 06/17/2021   Procedure: CYSTOSCOPY WITH URETHRAL DILATATION;  Surgeon: Twylla Glendia BROCKS, MD;  Location: [facility] ORS;  Service: Urology;  Laterality: N/A;  Optilume Balloon   ERCP N/A 03/15/2024   Procedure: ERCP, WITH INTERVENTION IF INDICATED;  Surgeon: Jinny Carmine, MD;  Location: [facility] ENDOSCOPY;  Service: Endoscopy;  Laterality: N/A;   ERCP N/A 04/17/2024   Procedure: ERCP, WITH INTERVENTION IF  INDICATED;  Surgeon: Jinny Carmine, MD;  Location: [facility] ENDOSCOPY;  Service: Endoscopy;  Laterality: N/A;   HERNIA REPAIR  1998   IR BILIARY DRAIN PLACEMENT WITH CHOLANGIOGRAM  03/16/2024   IR EXCHANGE BILIARY DRAIN  04/28/2024   LEFT HEART CATH AND CORONARY ANGIOGRAPHY N/A 12/27/2021   Procedure:  LEFT HEART CATH AND CORONARY ANGIOGRAPHY;  Surgeon: Mady Bruckner, MD;  Location: [facility];  Service: Cardiovascular;  Laterality: N/A;   PILONIDAL CYST EXCISION     TONSILLECTOMY     URETHRA SURGERY  1995    Social History   Socioeconomic History   Marital status: Married    Spouse name: Not on file   Number of children: Not on file   Years of education: Not on file   Highest education level: Not on file  Occupational History   Not on file  Tobacco Use   Smoking status: Every Day    Types: Cigars   Smokeless tobacco: Never   Tobacco comments:    2-3 cigars per day  Vaping Use   Vaping status: Never Used  Substance and Sexual Activity   Alcohol use: No    Alcohol/week: 0.0 standard drinks of alcohol   Drug use: No   Sexual activity: Yes    Birth control/protection: None  Other Topics Concern   Not on file  Social History Narrative   Not on file   Social Drivers of Health   Financial Resource Strain: Not on file  Food Insecurity: No Food Insecurity (05/01/2024)   Hunger Vital Sign    Worried About Running Out of Food in the Last Year: Never true    Ran Out of Food in the Last Year: Never true  Transportation Needs: No Transportation Needs (05/01/2024)   PRAPARE - Administrator, Civil Service (Medical): No    Lack of Transportation (Non-Medical): No  Physical Activity: Not on file  Stress: Not on file  Social Connections: Socially Integrated (05/01/2024)   Social Connection and Isolation Panel    Frequency of Communication with Friends and Family: More than three times a week    Frequency of Social Gatherings with Friends and Family: Twice a week    Attends Religious Services: More than 4 times per year    Active Member of [employer] or Organizations: Yes    Attends Engineer, structural: More than 4 times per year    Marital Status: Married  Catering manager Violence: Not At Risk (05/01/2024)   Humiliation, Afraid, Rape, and Kick  questionnaire    Fear of Current or Ex-Partner: No    Emotionally Abused: No    Physically Abused: No    Sexually Abused: No    Family History  Problem Relation Age of Onset   Hematuria Father    Kidney disease Neg Hx    Prostate cancer Neg Hx    Allergies  Allergen Reactions   Citalopram Nausea And Vomiting    Dizziness, unsteady, balanced issues   Sulfamethoxazole -Trimethoprim      Fatigue and muscle ache Other reaction(s): Other (See Comments) Fatigue and muscle ache   Trimethoprim  Other (See Comments)    Fatigue, hypotension   I? Current Facility-Administered Medications  Medication Dose Route Frequency Provider Last Rate Last Admin   alum & mag hydroxide-simeth (MAALOX/MYLANTA) 200-200-20 MG/5ML suspension 15 mL  15 mL Oral Q6H PRN Cox, Amy N, DO   15 mL at 05/03/24 2105   aspirin  EC tablet 81 mg  81 mg Oral Daily  Cox, Amy N, DO   81 mg at 05/05/24 9180   carvedilol  (COREG ) tablet 3.125 mg  3.125 mg Oral BID WC Awanda City, MD       enoxaparin  (LOVENOX ) injection 40 mg  40 mg Subcutaneous Q24H Awanda City, MD   40 mg at 05/04/24 2143   insulin  aspart (novoLOG ) injection 0-6 Units  0-6 Units Subcutaneous TID WC Zhang, Dekui, MD   1 Units at 05/04/24 1757   meropenem  (MERREM ) 1 g in sodium chloride  0.9 % 100 mL IVPB  1 g Intravenous Q8H Lenon Elsie HERO, [facility]       nicotine  (NICODERM CQ  - dosed in mg/24 hours) patch 21 mg  21 mg Transdermal Daily PRN Cox, Amy N, DO       ondansetron  (ZOFRAN ) tablet 4 mg  4 mg Oral Q6H PRN Cox, Amy N, DO       Or   ondansetron  (ZOFRAN ) injection 4 mg  4 mg Intravenous Q6H PRN Cox, Amy N, DO       oxyCODONE  (Oxy IR/ROXICODONE ) immediate release tablet 5 mg  5 mg Oral Q8H PRN Cox, Amy N, DO   5 mg at 05/04/24 2156   pantoprazole  (PROTONIX ) EC tablet 40 mg  40 mg Oral BID Cox, Amy N, DO   40 mg at 05/05/24 9180   polyethylene glycol (MIRALAX  / GLYCOLAX ) packet 17 g  17 g Oral Daily Laurita Pillion, MD       senna-docusate (Senokot-S) tablet 1 tablet   1 tablet Oral QHS PRN Cox, Amy N, DO       silodosin  (RAPAFLO ) capsule 8 mg  8 mg Oral Q breakfast Awanda City, MD   8 mg at 05/05/24 1205   sucralfate  (CARAFATE ) 1 GM/10ML suspension 1 g  1 g Oral TID WC & HS Zhang, Dekui, MD   1 g at 05/05/24 1205   testosterone  (ANDROGEL ) 50 MG/5GM (1%) gel 5 g  5 g Transdermal Daily Awanda City, MD   5 g at 05/04/24 2156     Abtx:  Anti-infectives (From admission, onward)    Start     Dose/Rate Route Frequency Ordered Stop   05/05/24 1300  meropenem  (MERREM ) 1 g in sodium chloride  0.9 % 100 mL IVPB        1 g 200 mL/hr over 30 Minutes Intravenous Every 8 hours 05/05/24 1212     05/01/24 1400  meropenem  (MERREM ) 1 g in sodium chloride  0.9 % 100 mL IVPB        1 g 200 mL/hr over 30 Minutes Intravenous Every 8 hours 05/01/24 1347 05/02/24 0958   05/01/24 1300  ceFEPIme  (MAXIPIME ) 2 g in sodium chloride  0.9 % 100 mL IVPB        2 g 200 mL/hr over 30 Minutes Intravenous  Once 05/01/24 1245 05/01/24 1351   05/01/24 1300  metroNIDAZOLE  (FLAGYL ) IVPB 500 mg  Status:  Discontinued        500 mg 100 mL/hr over 60 Minutes Intravenous  Once 05/01/24 1245 05/01/24 1347   05/01/24 1300  vancomycin  (VANCOCIN ) IVPB 1000 mg/200 mL premix  Status:  Discontinued        1,000 mg 200 mL/hr over 60 Minutes Intravenous  Once 05/01/24 1245 05/01/24 1347       REVIEW OF SYSTEMS:  Const:  fever,  chills, negative weight loss Eyes: negative diplopia or visual changes, negative eye pain ENT: negative coryza, negative sore throat Resp: negative cough, hemoptysis, dyspnea Cards: negative for chest pain,  palpitations, lower extremity edema GU: negative for frequency, dysuria and hematuria GI: Negative for abdominal pain, diarrhea, bleeding, constipation Skin: negative for rash and pruritus Heme: negative for easy bruising and gum/nose bleeding MS: negative for myalgias, arthralgias, back pain and muscle weakness Neurolo:negative for headaches, dizziness, vertigo, memory  problems  Psych: negative for feelings of anxiety, depression  Endocrine: negative for thyroid, diabetes Allergy/Immunology- negative for any medication or food allergies ? Pertinent Positives include : Objective:  VITALS:  BP 115/68 (BP Location: Right Arm)   Pulse 79   Temp 98.3 F (36.8 C) (Oral)   Resp 18   Ht 5' 11 (1.803 m)   Wt 81.4 kg   SpO2 94%   BMI 25.03 kg/m   PHYSICAL EXAM:  General: Alert, cooperative, no distress, appears stated age.  Head: Normocephalic, without obvious abnormality, atraumatic. Eyes: Conjunctivae clear, anicteric sclerae. Pupils are equal ENT Nares normal. No drainage or sinus tenderness. Lips, mucosa, and tongue normal. No Thrush Neck: Supple, symmetrical, no adenopathy, thyroid: non tender no carotid bruit and no JVD. Back: No CVA tenderness. Lungs: Clear to auscultation bilaterally. No Wheezing or Rhonchi. No rales. Heart: Regular rate and rhythm, no murmur, rub or gallop. Abdomen: Soft, non-tender,not distended. Bowel sounds normal. No masses Rt upper quadrant drain- bilious fluid Extremities:left ,midline  atraumatic, no cyanosis. No edema. No clubbing Skin: No rashes or lesions. Or bruising Lymph: Cervical, supraclavicular normal. Neurologic: Grossly non-focal Pertinent Labs Lab Results CBC    Component Value Date/Time   WBC 9.0 05/02/2024 0621   RBC 3.55 (L) 05/02/2024 0621   HGB 10.9 (L) 05/02/2024 0621   HCT 32.9 (L) 05/02/2024 0621   PLT 340 05/02/2024 0621   MCV 92.7 05/02/2024 0621   MCH 30.7 05/02/2024 0621   MCHC 33.1 05/02/2024 0621   RDW 15.0 05/02/2024 0621   LYMPHSABS 1.4 04/26/2024 1602   MONOABS 0.8 04/26/2024 1602   EOSABS 0.1 04/26/2024 1602   BASOSABS 0.1 04/26/2024 1602       Latest Ref Rng & Units 05/02/2024    6:21 AM 05/01/2024   10:35 AM 04/28/2024    5:27 AM  CMP  Glucose 70 - 99 mg/dL 882  829  895   BUN 8 - 23 mg/dL 14  14  13    Creatinine 0.61 - 1.24 mg/dL 9.05  9.00  9.14   Sodium 135 -  145 mmol/L 135  133  137   Potassium 3.5 - 5.1 mmol/L 3.8  4.2  3.5   Chloride 98 - 111 mmol/L 101  99  104   CO2 22 - 32 mmol/L 26  24  26    Calcium  8.9 - 10.3 mg/dL 8.4  8.6  8.0   Total Protein 6.5 - 8.1 g/dL  7.0  5.7   Total Bilirubin 0.0 - 1.2 mg/dL  1.2  1.0   Alkaline Phos 38 - 126 U/L  130  123   AST 15 - 41 U/L  19  18   ALT 0 - 44 U/L  21  27       Wbc  Microbiology: Recent Results (from the past 240 hours)  Culture, blood (Routine x 2)     Status: None   Collection Time: 04/26/24  4:02 PM   Specimen: BLOOD  Result Value Ref Range Status   Specimen Description BLOOD BLOOD LEFT ARM  Final   Special Requests   Final    BOTTLES DRAWN AEROBIC AND ANAEROBIC Blood Culture adequate volume   Culture  Final    NO GROWTH 5 DAYS Performed at [facility], [address] [place], [place] [address]    Report Status 05/01/2024 FINAL  Final  Culture, blood (Routine x 2)     Status: None   Collection Time: 04/26/24  4:03 PM   Specimen: BLOOD  Result Value Ref Range Status   Specimen Description BLOOD BLOOD RIGHT ARM  Final   Special Requests   Final    BOTTLES DRAWN AEROBIC AND ANAEROBIC Blood Culture results may not be optimal due to an inadequate volume of blood received in culture bottles   Culture   Final    NO GROWTH 5 DAYS Performed at [facility], [address], [place], [place] [address]    Report Status 05/01/2024 FINAL  Final  Resp panel by RT-PCR (RSV, Flu A&B, Covid) Anterior Nasal Swab     Status: None   Collection Time: 04/26/24  6:27 PM   Specimen: Anterior Nasal Swab  Result Value Ref Range Status   SARS Coronavirus 2 by RT PCR NEGATIVE NEGATIVE Final    Comment: (NOTE) SARS-CoV-2 target nucleic acids are NOT DETECTED.  The SARS-CoV-2 RNA is generally detectable in upper respiratory specimens during the acute phase of infection. The lowest concentration of SARS-CoV-2 viral copies this assay can detect is 138 copies/mL. A negative  result does not preclude SARS-Cov-2 infection and should not be used as the sole basis for treatment or other patient management decisions. A negative result may occur with  improper specimen collection/handling, submission of specimen other than nasopharyngeal swab, presence of viral mutation(s) within the areas targeted by this assay, and inadequate number of viral copies(<138 copies/mL). A negative result must be combined with clinical observations, patient history, and epidemiological information. The expected result is Negative.  Fact Sheet for Patients:  BloggerCourse.com  Fact Sheet for Healthcare Providers:  SeriousBroker.it  This test is no t yet approved or cleared by the [place]  FDA and  has been authorized for detection and/or diagnosis of SARS-CoV-2 by FDA under an Emergency Use Authorization (EUA). This EUA will remain  in effect (meaning this test can be used) for the duration of the COVID-19 declaration under Section 564(b)(1) of the Act, 21 U.S.C.section 360bbb-3(b)(1), unless the authorization is terminated  or revoked sooner.       Influenza A by PCR NEGATIVE NEGATIVE Final   Influenza B by PCR NEGATIVE NEGATIVE Final    Comment: (NOTE) The Xpert Xpress SARS-CoV-2/FLU/RSV plus assay is intended as an aid in the diagnosis of influenza from Nasopharyngeal swab specimens and should not be used as a sole basis for treatment. Nasal washings and aspirates are unacceptable for Xpert Xpress SARS-CoV-2/FLU/RSV testing.  Fact Sheet for Patients: BloggerCourse.com  Fact Sheet for Healthcare Providers: SeriousBroker.it  This test is not yet approved or cleared by the [place]  FDA and has been authorized for detection and/or diagnosis of SARS-CoV-2 by FDA under an Emergency Use Authorization (EUA). This EUA will remain in effect (meaning this test can be used)  for the duration of the COVID-19 declaration under Section 564(b)(1) of the Act, 21 U.S.C. section 360bbb-3(b)(1), unless the authorization is terminated or revoked.     Resp Syncytial Virus by PCR NEGATIVE NEGATIVE Final    Comment: (NOTE) Fact Sheet for Patients: BloggerCourse.com  Fact Sheet for Healthcare Providers: SeriousBroker.it  This test is not yet approved or cleared by the [place]  FDA and has been authorized for detection and/or diagnosis of SARS-CoV-2 by FDA  under an Emergency Use Authorization (EUA). This EUA will remain in effect (meaning this test can be used) for the duration of the COVID-19 declaration under Section 564(b)(1) of the Act, 21 U.S.C. section 360bbb-3(b)(1), unless the authorization is terminated or revoked.  Performed at [facility], [address] [place], [place] [address]   Respiratory (~20 pathogens) panel by PCR     Status: None   Collection Time: 04/26/24  6:27 PM   Specimen: Nasopharyngeal Swab; Respiratory  Result Value Ref Range Status   Adenovirus NOT DETECTED NOT DETECTED Final   Coronavirus 229E NOT DETECTED NOT DETECTED Final    Comment: (NOTE) The Coronavirus on the Respiratory Panel, DOES NOT test for the novel  Coronavirus (2019 nCoV)    Coronavirus HKU1 NOT DETECTED NOT DETECTED Final   Coronavirus NL63 NOT DETECTED NOT DETECTED Final   Coronavirus OC43 NOT DETECTED NOT DETECTED Final   Metapneumovirus NOT DETECTED NOT DETECTED Final   Rhinovirus / Enterovirus NOT DETECTED NOT DETECTED Final   Influenza A NOT DETECTED NOT DETECTED Final   Influenza B NOT DETECTED NOT DETECTED Final   Parainfluenza Virus 1 NOT DETECTED NOT DETECTED Final   Parainfluenza Virus 2 NOT DETECTED NOT DETECTED Final   Parainfluenza Virus 3 NOT DETECTED NOT DETECTED Final   Parainfluenza Virus 4 NOT DETECTED NOT DETECTED Final   Respiratory Syncytial Virus NOT DETECTED NOT DETECTED  Final   Bordetella pertussis NOT DETECTED NOT DETECTED Final   Bordetella Parapertussis NOT DETECTED NOT DETECTED Final   Chlamydophila pneumoniae NOT DETECTED NOT DETECTED Final   Mycoplasma pneumoniae NOT DETECTED NOT DETECTED Final    Comment: Performed at [facility], [address], [place], [place] [address]  MRSA Next Gen by PCR, Nasal     Status: None   Collection Time: 04/27/24 12:15 PM   Specimen: Nasal Mucosa; Nasal Swab  Result Value Ref Range Status   MRSA by PCR Next Gen NOT DETECTED NOT DETECTED Final    Comment: (NOTE) The GeneXpert MRSA Assay (FDA approved for NASAL specimens only), is one component of a comprehensive MRSA colonization surveillance program. It is not intended to diagnose MRSA infection nor to guide or monitor treatment for MRSA infections. Test performance is not FDA approved in patients less than 59 years old. Performed at [facility], [address] [place], [place] [address]   Culture, blood (Routine x 2)     Status: None (Preliminary result)   Collection Time: 05/01/24 12:00 PM   Specimen: BLOOD  Result Value Ref Range Status   Specimen Description BLOOD BLOOD LEFT FOREARM  Final   Special Requests   Final    BOTTLES DRAWN AEROBIC AND ANAEROBIC Blood Culture results may not be optimal due to an inadequate volume of blood received in culture bottles   Culture   Final    NO GROWTH 4 DAYS Performed at [facility], [address] [place], [place] [address]    Report Status PENDING  Incomplete  Culture, blood (Routine x 2)     Status: None (Preliminary result)   Collection Time: 05/01/24  1:19 PM   Specimen: BLOOD  Result Value Ref Range Status   Specimen Description BLOOD BLOOD LEFT ARM  Final   Special Requests   Final    BOTTLES DRAWN AEROBIC AND ANAEROBIC Blood Culture results may not be optimal due to an inadequate volume of blood received in culture bottles   Culture   Final    NO  GROWTH 4 DAYS Performed at  [facility], [address] [place], [place] [address]    Report Status PENDING  Incomplete  Body fluid culture w Gram Stain     Status: None (Preliminary result)   Collection Time: 05/03/24 11:30 AM   Specimen: Gallbladder; Body Fluid  Result Value Ref Range Status   Specimen Description   Final    GALL BLADDER Performed at [facility], [address] [place], [place] [address]    Special Requests   Final    NONE Performed at [facility], [address] [place], [place] [address]    Gram Stain   Final    NO WBC SEEN FEW GRAM POSITIVE COCCI FEW GRAM POSITIVE RODS RARE GRAM NEGATIVE RODS    Culture   Final    MODERATE PSEUDOMONAS AERUGINOSA MODERATE ENTEROCOCCUS FAECALIS MODERATE ENTEROCOCCUS HIRAE SUSCEPTIBILITIES TO FOLLOW Performed at [facility], [address] [place], [place] [address]    Report Status PENDING  Incomplete    Lines and Device Date on insertion # of days DC  Central line ( midline) 04/24/24    Biliary drain            IMAGING RESULTS:  I have personally reviewed the films ?question left lower lobe atelectasis  Impression/Recommendation ?78 yr male with  biliary obstruction has int/ext biliary drain, recent ecoli bacteremia wa son ertapenem  and was having fever inspite of antibiotic and had been admitted X2  Pseudomonas, enterococcus in the bile culture- there is no wbc but because of recurrent admission for fever and no other cause found will treat this for 10-days- awaiting susceptibiltiy to see whether there is an oral antibiotic option  Recent ecoli- completed 14 days treatment with ertapenem  on 7/21 Day 11 of midline- will discuss with vascualr team about the duration we can keep midline  Biliary obstruction- had ERCP and stent replacement on 04/17/24- The latest CT scan from 7/17 shows the stent to be in the duodenem mostly If he has another fever we will have to repeat the CT Biliary drain  present Brushings from the CBD- scant cellularity- no malignancy noted  Anemia  Lfts have normalized   ?CAD s/p CABG  DM  H/o urethral strictures with frequent UTI. Will do a post void bladder scan If fever recurs will need urine culture ? _    ________________________________________________ Discussed with patient and requesting provider

## 2024-05-05 NOTE — Progress Notes (Signed)
 Progress Note   Patient: Casey Peters FMW:[id number] DOB: 07-20-1946 DOA: 05/01/2024     1 DOS: the patient was seen and examined on 05/05/2024   Brief hospital course: Mr. 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 Patient previously had a CBD stent, biliary drain 6/5, exchange of the drain 7/18.  Patient was also treated with ertapenem  7/7 to 7/14 for E. coli septicemia. For the past 2 weeks, patient had intermittent high fever, happens every 48 hours to 72 hours.  Temperature as high as 102.    Principal Problem:   Recurrent fever Active Problems:   HFrEF (heart failure with reduced ejection fraction) (HCC)   Type 2 diabetes mellitus (HCC)   Fever   CAD S/P percutaneous coronary angioplasty   HLD (hyperlipidemia)   Controlled type 2 diabetes mellitus without complication (HCC)   Tobacco use   Hypertriglyceridemia   Elevated liver enzymes   E coli bacteremia   Leukocytosis   Weakness   Assessment and Plan: Recurrent fever Possible ascending cholangitis. 7/23. Etiology still unclear.  Appears that the patient has intermittent fever for the last 2 or 3 weeks, he just finished the ertapenem  for E. coli bacteremia. Today, UA does not support UTI, chest x-ray did not show any aspiration pneumonia, patient has no history of aspiration. Patient has no diarrhea. No skin cellulitis. Recent echocardiogram from 04/18/2024 showed ejection fraction 50 to 55%, no evidence of vegetation. Since patient had intermittent fever for the last 2 or 3 weeks, I will try to establish diagnosis.  Blood cultures in progress no growth I will send a culture from the biliary drain.  If this is negative, fever  could be caused by PICC line.  Will remove PICC line if culture from the biliary drain is negative. Another possibility is malignancy related. 7/25.  Culture from bile drain now growth Pseudomonas.  Recurrent fever could be secondary to ascending cholangitis.  Meropenem  started.  ID consult obtained.   Chronic abdominal pain. .  To be secondary to gastritis with constipation.  Patient is taking Protonix , will add sucralfate .  Also started stool softener. Had a bowel movement yesterday evening.  Still has intermittent abdominal pain.  Appears to be secondary to biliary drain. Continue as needed oxycodone .   E coli bacteremia Antibiotics has completed   Tobacco use As needed nicotine  patch ordered   Controlled type 2 diabetes mellitus without complication (HCC) Continue sliding scale insulin .   Weakness. Physical therapy, patient still does not want nursing home placement.        Subjective:  Patient still complaining of intermittent abdominal pain.  No nausea vomiting.  Has not had a fever for the last 3 days.  Physical Exam: Vitals:   05/04/24 1521 05/04/24 2041 05/05/24 0437 05/05/24 0726  BP: 120/64 117/68 126/72 115/68  Pulse: 79 83 79 79  Resp: 18 14 12 18   Temp: 98.9 F (37.2 C)  98.9 F (37.2 C) 98.3 F (36.8 C)  TempSrc: Oral  Oral Oral  SpO2: 97% 95% 97% 94%  Weight:      Height:       General exam: Appears calm and comfortable  Respiratory system: Clear to auscultation. Respiratory effort normal. Cardiovascular system: S1 & S2 heard, RRR. No JVD, murmurs, rubs, gallops  or clicks. No pedal edema. Gastrointestinal system: Abdomen is nondistended, soft and nontender. No organomegaly or masses felt. Normal bowel sounds heard. Central nervous system: Alert and oriented x3. No focal neurological deficits. Extremities: Symmetric 5 x 5 power. Skin: No rashes, lesions or ulcers Psychiatry: Judgement and insight appear normal. Mood & affect appropriate.    Data  Reviewed:  Lab results reviewed.  Family Communication: Wife updated at bedside.  Disposition: Status is: Observation Discussed with UR, may need to transition to inpatient status due to IV antibiotic use.     Time spent: 35 minutes  Author: Murvin Mana, MD 05/05/2024 1:01 PM  For on call review [email or web address].

## 2024-05-05 NOTE — Plan of Care (Signed)

## 2024-05-05 NOTE — Progress Notes (Signed)
 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 Tx session targeted improving ADL performance and education to pt/wife re: DME/AE, energy conservation techniques for safe ADL completion at home. Wife reports she prefers to resume [facility] after discharge and will work on getting him a wheelchair on his weak days. All questions answered within scope. Pt is left as received, all needs met. OT will continue to follow.       If plan is discharge home, recommend the following:  A little help with walking and/or transfers;A little help with bathing/dressing/bathroom;Assist for transportation;Help with stairs or ramp for entrance;Assistance with cooking/housework   Equipment Recommendations  Other (comment) ([facility]wc- wife will purchase privately per her report)    Recommendations for Other Services      Precautions / Restrictions Precautions Precautions: Fall Recall of Precautions/Restrictions: Intact Restrictions Weight Bearing Restrictions Per Provider Order: No       Mobility Bed Mobility Overal bed mobility: Needs Assistance Bed Mobility: Rolling, Sidelying to Sit, Sit to Sidelying Rolling: Supervision Sidelying to sit: Contact guard assist     Sit to sidelying: Contact guard assist General bed mobility comments: attempted supine>sit, pt with poor body  mechanics,reports drain is uncomfortable; pulling on therapist arm; improved with frequent cueing for log roll    Transfers Overall transfer level: Needs assistance Equipment used: Rolling walker (2 wheels) Transfers: Sit to/from Stand Sit to Stand: Contact guard assist                 Balance Overall balance assessment: Needs assistance Sitting-balance support: Feet supported Sitting balance-Leahy Scale: Good     Standing balance support: Bilateral upper extremity supported, Reliant on assistive device for balance Standing balance-Leahy Scale: Fair                             ADL either performed or assessed with clinical judgement   ADL Overall ADL's : Needs assistance/impaired                                       General ADL Comments: SET UP for brushing teeth sitting on edge of bed, STS with CGA, three steps up the bed with RW with CGA    Extremity/Trunk Assessment              Vision       Perception     Praxis     Communication Communication Communication: No apparent difficulties   Cognition Arousal: Alert Behavior During Therapy: WFL for tasks assessed/performed, Flat affect Cognition: No apparent impairments                               Following commands: Intact  Cueing   Cueing Techniques: Verbal cues, Gestural cues  Exercises Other Exercises Other Exercises: edu pt/wife re: DME/AE for safe ADL completion, energy conservation techniques for improved ADL performance    Shoulder Instructions       General Comments vss throughout; drain intact pre/post session    Pertinent Vitals/ Pain       Pain Assessment Pain Assessment: Faces Faces Pain Scale: Hurts a little bit Pain Location: drain area Pain Descriptors / Indicators: Discomfort Pain Intervention(s): Monitored during session, Repositioned  Home Living                                          Prior  Functioning/Environment              Frequency  Min 2X/week        Progress Toward Goals  OT Goals(current goals can now be found in the care plan section)  Progress towards OT goals: Progressing toward goals  Acute Rehab OT Goals Time For Goal Achievement: 05/18/24  Plan      Co-evaluation                 AM-PAC OT 6 Clicks Daily Activity     Outcome Measure   Help from another person eating meals?: None Help from another person taking care of personal grooming?: None Help from another person toileting, which includes using toliet, bedpan, or urinal?: None Help from another person bathing (including washing, rinsing, drying)?: A Little Help from another person to put on and taking off regular upper body clothing?: None Help from another person to put on and taking off regular lower body clothing?: A Lot 6 Click Score: 21    End of Session Equipment Utilized During Treatment: Rolling walker (2 wheels)  OT Visit Diagnosis: Unsteadiness on feet (R26.81);Other abnormalities of gait and mobility (R26.89);Muscle weakness (generalized) (M62.81)   Activity Tolerance Patient tolerated treatment well   Patient Left in bed;with call bell/phone within reach;with bed alarm set   Nurse Communication          Time: 1000-1013 OT Time Calculation (min): 13 min  Charges: OT General Charges $OT Visit: 1 Visit OT Treatments $Self Care/Home Management : 8-22 mins  Therisa Sheffield, OTD OTR/L  05/05/24, 10:20 AM

## 2024-05-05 NOTE — Progress Notes (Signed)
 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 Pt remained steady throughout ambulation, although distance was limited due to fatigue. Pt and pt's spouse stated that there is an alternate entrance into home with 4 steps to enter with a R rail. Pt was educated on sequencing when ascending/descending 4 steps with a R rail and pt was able to demonstrate stair navigation safely with R rail and remained steady throughout. Pt and spouse were advised to use alternate entrance into home with R rail to ensure pt safety. Pt reported no adverse symptoms during the session other than fatigue with SpO2 and HR WNL throughout on room air. Pt will benefit from continued PT services upon discharge to safely address deficits listed in patient problem list for decreased caregiver assistance and eventual return to PLOF.      If plan is discharge home, recommend the following: A little help with walking and/or transfers;A little help with bathing/dressing/bathroom;Assistance with cooking/housework;Help with stairs or ramp for entrance;Assist for transportation   Can travel by private vehicle     No  Equipment Recommendations  Wheelchair (measurements PT);Wheelchair cushion (measurements PT)    Recommendations for Other Services       Precautions / Restrictions Precautions Precautions: Fall Recall of Precautions/Restrictions:  Intact Restrictions Weight Bearing Restrictions Per Provider Order: No     Mobility  Bed Mobility Overal bed mobility: Modified Independent             General bed mobility comments: Pt required extra time and effort for all bed mobility tasks, but no physical assistance required    Transfers Overall transfer level: Needs assistance Equipment used: Rolling walker (2 wheels) Transfers: Sit to/from Stand Sit to Stand: Contact guard assist           General transfer comment: Pt required CGA fro STS from EOB with RW, but remained steady throughout and no LOBs occured    Ambulation/Gait Ambulation/Gait assistance: Contact guard assist Gait Distance (Feet): 15 Feet x 1, 5 feet x 1 Assistive device: Rolling walker (2 wheels) Gait Pattern/deviations: Step-through pattern, Decreased stride length Gait velocity: decreased     General Gait Details: Pt demonstrated decreaed stride length bilaterally, but remained steady throughout and no LOBs occured. Ambulation distance was self-selected by pt, due to fatigue   Stairs Stairs: Yes Stairs assistance: Contact guard assist Stair Management: One rail Right Number of Stairs: 4 General stair comments: Pt ascended/descended 1 step with bilateral rail, 1 step wit only R rail, and 4 steps with R rail with CGA and remained steady throughout.   Wheelchair Mobility     Tilt Bed    Modified Rankin (Stroke Patients Only)       Balance Overall balance assessment: Needs assistance Sitting-balance support: Feet supported, No upper extremity supported Sitting balance-Leahy Scale: Good     Standing balance support: Bilateral upper extremity supported, Reliant on assistive device for balance, During functional activity Standing balance-Leahy Scale: Fair Standing balance comment: Pt  reliant on RW to maintain balance in stance and ambulation, but remained steady throughout and no LOBs occured                             Communication Communication Communication: No apparent difficulties  Cognition Arousal: Alert Behavior During Therapy: Flat affect   PT - Cognitive impairments: No apparent impairments                         Following commands: Intact      Cueing Cueing Techniques: Verbal cues, Gestural cues, Visual cues  Exercises      General Comments General comments (skin integrity, edema, etc.): vss throughout; drain intact pre/post session      Pertinent Vitals/Pain Pain Assessment Pain Assessment: Faces Faces Pain Scale: Hurts a little bit    Home Living                          Prior Function            PT Goals (current goals can now be found in the care plan section) Progress towards PT goals: Progressing toward goals    Frequency    Min 2X/week      PT Plan      Co-evaluation              AM-PAC PT 6 Clicks Mobility   Outcome Measure  Help needed turning from your back to your side while in a flat bed without using bedrails?: None Help needed moving from lying on your back to sitting on the side of a flat bed without using bedrails?: None Help needed moving to and from a bed to a chair (including a wheelchair)?: A Little Help needed standing up from a chair using your arms (e.g., wheelchair or bedside chair)?: A Little Help needed to walk in hospital room?: A Little Help needed climbing 3-5 steps with a railing? : A Little 6 Click Score: 20    End of Session Equipment Utilized During Treatment: Gait belt Activity Tolerance: Patient limited by fatigue Patient left: in bed;with call bell/phone within reach;with bed alarm set;with family/visitor present Nurse Communication: Mobility status PT Visit Diagnosis: Unsteadiness on feet (R26.81);Muscle weakness (generalized) (M62.81);History of falling (Z91.81);Other abnormalities of gait and mobility (R26.89)     Time: 8974-8941 PT Time Calculation (min) (ACUTE ONLY): 33  min  Charges:                            Leontine Ingles, SPT 05/05/24, 12:14 PM

## 2024-05-06 DIAGNOSIS — R7881 Bacteremia: Secondary | ICD-10-CM | POA: Diagnosis not present

## 2024-05-06 DIAGNOSIS — B9620 Unspecified Escherichia coli [E. coli] as the cause of diseases classified elsewhere: Secondary | ICD-10-CM | POA: Diagnosis not present

## 2024-05-06 DIAGNOSIS — A689 Relapsing fever, unspecified: Secondary | ICD-10-CM | POA: Diagnosis not present

## 2024-05-06 DIAGNOSIS — I5032 Chronic diastolic (congestive) heart failure: Secondary | ICD-10-CM | POA: Insufficient documentation

## 2024-05-06 DIAGNOSIS — I5020 Unspecified systolic (congestive) heart failure: Secondary | ICD-10-CM | POA: Diagnosis not present

## 2024-05-06 LAB — CULTURE, BLOOD (ROUTINE X 2)
Culture: NO GROWTH
Culture: NO GROWTH

## 2024-05-06 LAB — GLUCOSE, CAPILLARY
Glucose-Capillary: 136 mg/dL — ABNORMAL HIGH (ref 70–99)
Glucose-Capillary: 163 mg/dL — ABNORMAL HIGH (ref 70–99)
Glucose-Capillary: 158 mg/dL — ABNORMAL HIGH (ref 70–99)
Glucose-Capillary: 148 mg/dL — ABNORMAL HIGH (ref 70–99)

## 2024-05-06 LAB — CREATININE, SERUM
Creatinine, Ser: 0.99 mg/dL (ref 0.61–1.24)
GFR, Estimated: >60 mL/min (ref >60)

## 2024-05-06 NOTE — Plan of Care (Signed)

## 2024-05-06 NOTE — Progress Notes (Signed)
 Progress Note   Patient: ABDULMALIK Peters FMW:[id number] DOB: 1946/01/19 DOA: 05/01/2024     1 DOS: the patient was seen and examined on 05/06/2024   Brief hospital course: Mr. 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 Patient previously had a CBD stent, biliary drain 6/5, exchange of the drain 7/18.  Patient was also treated with ertapenem  7/7 to 7/14 for E. coli septicemia. For the past 2 weeks, patient had intermittent high fever, happens every 48 hours to 72 hours.  Temperature as high as 102.    Principal Problem:   Recurrent fever Active Problems:   HFrEF (heart failure with reduced ejection fraction) (HCC)   Type 2 diabetes mellitus (HCC)   Fever   CAD S/P percutaneous coronary angioplasty   HLD (hyperlipidemia)   Controlled type 2 diabetes mellitus without complication (HCC)   Tobacco use   Hypertriglyceridemia   Elevated liver enzymes   E coli bacteremia   Leukocytosis   Weakness   Assessment and Plan:  Recurrent fever Possible ascending cholangitis. 7/23. Etiology still unclear.  Appears that the patient has intermittent fever for the last 2 or 3 weeks, he just finished the ertapenem  for E. coli bacteremia. Today, UA does not support UTI, chest x-ray did not show any aspiration pneumonia, patient has no history of aspiration. Patient has no diarrhea. No skin cellulitis. Recent echocardiogram from 04/18/2024 showed ejection fraction 50 to 55%, no evidence of vegetation. Since patient had intermittent fever for the last 2 or 3 weeks, I will try to establish diagnosis.  Blood cultures in progress no growth I will send a culture from the biliary drain.  If this is negative, fever  could be caused by PICC line.  Will remove PICC line if culture from the biliary drain is negative. Another possibility is malignancy related. 7/25.  Culture from bile drain now growth Pseudomonas.  Recurrent fever could be secondary to ascending cholangitis.  Meropenem  started.  ID consult obtained. 7/26.  Blood culture growing Enterococcus and Pseudomonas.  Still not finalized.  Will keep patient for another day.   Chronic abdominal pain. .  To be secondary to gastritis with constipation.  Patient is taking Protonix , will add sucralfate .  Also started stool softener. Had a bowel movement yesterday evening.  Still has intermittent abdominal pain.  Appears to be secondary to biliary drain. Continue as needed oxycodone .   E coli bacteremia Antibiotics has completed   Tobacco use As needed nicotine  patch ordered   Controlled type 2 diabetes mellitus without complication (HCC) Continue sliding scale insulin .   Weakness. Physical therapy, patient still does not want nursing home placement.       Subjective:  Patient doing better, abdominal pain seems to be better since last night. Patient had temp of 99.9 yesterday evening.  Physical Exam: Vitals:   05/05/24 1512 05/05/24 2000 05/06/24 0430 05/06/24 0725  BP: 121/74 123/64 104/67 120/69  Pulse: 92 89 74 74  Resp: 18 20 20 17   Temp: (!) 97.4 F (36.3 C) 99.9 F (37.7 C) 98.6 F (37 C) 97.8 F (36.6 C)  TempSrc: Oral Oral Oral Oral  SpO2: 97% 93% 93% 97%  Weight:      Height:       General exam: Appears calm and comfortable  Respiratory system: Clear to auscultation. Respiratory effort normal. Cardiovascular system: S1 & S2 heard, RRR. No JVD, murmurs, rubs, gallops or clicks. No pedal edema. Gastrointestinal system: Abdomen is nondistended, soft and nontender. No organomegaly or masses felt. Normal bowel sounds heard. Central nervous system: Alert and oriented. No focal neurological deficits. Extremities: Symmetric 5 x 5  power. Skin: No rashes, lesions or ulcers Psychiatry: Judgement and insight appear normal. Mood & affect appropriate.    Data Reviewed:  Lab results reviewed.  Family Communication: Wife updated at the bedside  Disposition: Status is: Observation      Time spent: 35 minutes  Author: Murvin Mana, MD 05/06/2024 12:55 PM  For on call review [email or web address].

## 2024-05-07 DIAGNOSIS — A689 Relapsing fever, unspecified: Secondary | ICD-10-CM | POA: Diagnosis not present

## 2024-05-07 DIAGNOSIS — B9620 Unspecified Escherichia coli [E. coli] as the cause of diseases classified elsewhere: Secondary | ICD-10-CM | POA: Diagnosis not present

## 2024-05-07 DIAGNOSIS — R7881 Bacteremia: Secondary | ICD-10-CM | POA: Diagnosis not present

## 2024-05-07 DIAGNOSIS — E119 Type 2 diabetes mellitus without complications: Secondary | ICD-10-CM | POA: Diagnosis not present

## 2024-05-07 LAB — GLUCOSE, CAPILLARY
Glucose-Capillary: 129 mg/dL — ABNORMAL HIGH (ref 70–99)
Glucose-Capillary: 178 mg/dL — ABNORMAL HIGH (ref 70–99)
Glucose-Capillary: 126 mg/dL — ABNORMAL HIGH (ref 70–99)
Glucose-Capillary: 182 mg/dL — ABNORMAL HIGH (ref 70–99)

## 2024-05-07 MED ORDER — ENSURE PLUS HIGH PROTEIN PO LIQD
237.0000 mL | Freq: Two times a day (BID) | ORAL | Status: DC
  Administered 2024-05-07 (×2): 237 mL via ORAL

## 2024-05-07 NOTE — Progress Notes (Signed)
 Date of Admission:  05/01/2024    ID: Casey Peters is a 78 y.o. male Principal Problem:   Recurrent fever Active Problems:   Type 2 diabetes mellitus (HCC)   HLD (hyperlipidemia)   Controlled type 2 diabetes mellitus without complication (HCC)   Tobacco use   Hypertriglyceridemia   Elevated liver enzymes   E coli bacteremia   Fever   Leukocytosis   CAD S/P percutaneous coronary angioplasty   Weakness   Chronic diastolic CHF (congestive heart failure) (HCC)    Subjective: Pt is sleeping Wife at bed side Says he was feeling better [facility] with PT Appetite fine No fever  Medications:   aspirin  EC  81 mg Oral Daily   carvedilol   3.125 mg Oral BID WC   enoxaparin  (LOVENOX ) injection  40 mg Subcutaneous Q24H   feeding supplement  237 mL Oral BID BM   insulin  aspart  0-6 Units Subcutaneous TID WC   pantoprazole   40 mg Oral BID   polyethylene glycol  17 g Oral Daily   silodosin   8 mg Oral Q breakfast   sucralfate   1 g Oral TID WC & HS   testosterone   5 g Transdermal Daily    Objective: Vital signs in last 24 hours: Patient Vitals for the past 24 hrs:  BP Temp Temp src Pulse Resp SpO2  05/07/24 1522 112/61 98.1 F (36.7 C) Oral 73 16 96 %  05/07/24 0757 125/71 98.5 F (36.9 C) Oral 65 16 95 %  05/07/24 0201 111/69 98.4 F (36.9 C) Oral 76 17 97 %  05/06/24 2010 116/69 98.6 F (37 C) Oral 84 17 95 %     Lines and Device Date on insertion # of days DC  Chiropractor     Foley     ETT       PHYSICAL EXAM:  sleeping  Lab Results    Latest Ref Rng & Units 05/02/2024    6:21 AM 05/01/2024   10:35 AM 04/28/2024    5:27 AM  CBC  WBC 4.0 - 10.5 K/uL 9.0  14.2  10.3   Hemoglobin 13.0 - 17.0 g/dL 89.0  87.4  88.9   Hematocrit 39.0 - 52.0 % 32.9  36.9  32.8   Platelets 150 - 400 K/uL 340  424  335        Latest Ref Rng & Units 05/06/2024    5:16 AM 05/02/2024    6:21 AM 05/01/2024   10:35 AM  CMP  Glucose 70 - 99 mg/dL  882  829   BUN 8  - 23 mg/dL  14  14   Creatinine 9.38 - 1.24 mg/dL 9.00  9.05  9.00   Sodium 135 - 145 mmol/L  135  133   Potassium 3.5 - 5.1 mmol/L  3.8  4.2   Chloride 98 - 111 mmol/L  101  99   CO2 22 - 32 mmol/L  26  24   Calcium  8.9 - 10.3 mg/dL  8.4  8.6   Total Protein 6.5 - 8.1 g/dL   7.0   Total Bilirubin 0.0 - 1.2 mg/dL   1.2   Alkaline Phos 38 - 126 U/L   130   AST 15 - 41 U/L   19   ALT 0 - 44 U/L   21       Microbiology: Bile fluid culture enterococcus and pseudomonas Studies/Results: No results found.   Assessment/Plan: ?78 yr male with  biliary obstruction has  int/ext biliary drain, recent ecoli bacteremia wa son ertapenem  and was having fever inspite of antibiotic and had been admitted X2   Pseudomonas, enterococcus in the bile culture- there is no wbc but because of recurrent admission for fever and no other cause found will treat this for 10-days- awaiting susceptibiltiy to see whether there is an oral antibiotic option   Recent ecoli- completed 14 days treatment with ertapenem  on 7/21 Day 11 of midline-  discuss with IV team about the duration we can keep midline   Biliary obstruction- had ERCP and stent replacement on 04/17/24- The latest CT scan from 7/17 shows the stent to be in the duodenem mostly If he has another fever we will have to repeat the CT Biliary drain present Brushings from the CBD- scant cellularity- no malignancy noted   Anemia   Lfts have normalized    ?CAD s/p CABG   DM   H/o urethral strictures with frequent UTI. Will do a post void bladder scan If fever recurs will need urine culture   Discussed with wife

## 2024-05-07 NOTE — Plan of Care (Signed)

## 2024-05-07 NOTE — Progress Notes (Signed)
 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 Patient previously had a CBD stent, biliary drain 6/5, exchange of the drain 7/18.  Patient was also treated with ertapenem  7/7 to 7/14 for E. coli septicemia. For the past 2 weeks, patient had intermittent high fever, happens every 48 hours to 72 hours.  Temperature as high as 102.    Principal Problem:   Recurrent fever Active Problems:   Type 2 diabetes mellitus (HCC)   Fever   CAD S/P percutaneous coronary angioplasty   HLD (hyperlipidemia)   Controlled type 2 diabetes mellitus without complication (HCC)   Tobacco use   Hypertriglyceridemia   Elevated liver enzymes   E coli bacteremia   Leukocytosis   Weakness   Chronic diastolic CHF (congestive heart failure) (HCC)   Assessment and Plan: Recurrent fever Possible ascending cholangitis. 7/23. Etiology still unclear.  Appears that the patient has intermittent fever for the last 2 or 3 weeks, he just finished the ertapenem  for E. coli bacteremia. Today, UA does not support UTI, chest x-ray did not show any aspiration pneumonia, patient has no history of aspiration. Patient has no diarrhea. No skin cellulitis. Recent echocardiogram from 04/18/2024 showed ejection fraction 50 to 55%, no evidence of vegetation. Since patient had intermittent fever for the last 2 or 3 weeks, I will try to establish diagnosis.  Blood cultures in progress no growth I will send a culture from the biliary drain.  If this is negative, fever could  be caused by PICC line.  Will remove PICC line if culture from the biliary drain is negative. Another possibility is malignancy related. 7/25.  Culture from bile drain now growth Pseudomonas.  Recurrent fever could be secondary to ascending cholangitis.  Meropenem  started.  ID consult obtained. 7/26.  Blood culture growing Enterococcus and Pseudomonas.  Still not finalized.  Will keep patient for another day. Patient doing much better, abdominal pain essentially resolved.  Fever culture resolved since antibiotics.  Discussed with microbiology, Pseudomonas still waiting for susceptibility.  Most likely results to be available tomorrow.   Chronic abdominal pain. .  To be secondary to gastritis with constipation.  Patient is taking Protonix , will add sucralfate .  Also started stool softener. Had a bowel movement yesterday evening.  Still has intermittent abdominal pain.  Appears to be secondary to biliary drain. Abdominal pain much improved after treating with antibiotics.   E coli bacteremia Antibiotics has completed   Tobacco use As needed nicotine  patch ordered   Controlled type 2 diabetes mellitus without complication (HCC) Continue sliding scale insulin .   Weakness. Physical therapy, patient still does not want nursing home placement.        Subjective:  Patient doing well, no fever.  Abdominal pain also much better.  Physical Exam: Vitals:   05/06/24 1709 05/06/24 2010 05/07/24 0201 05/07/24 0757  BP: 117/66 116/69 111/69 125/71  Pulse: 81 84 76 65  Resp: 17 17 17 16   Temp: 98.2 F (36.8 C) 98.6 F (37 C) 98.4 F (36.9 C) 98.5 F (36.9 C)  TempSrc:  Oral Oral Oral  SpO2:  99% 95% 97% 95%  Weight:      Height:       General exam: Appears calm and comfortable  Respiratory system: Clear to auscultation. Respiratory effort normal. Cardiovascular system: S1 & S2 heard, RRR. No JVD, murmurs, rubs, gallops or clicks. No pedal edema. Gastrointestinal system: Abdomen is  nondistended, soft and nontender. No organomegaly or masses felt. Normal bowel sounds heard. Central nervous system: Alert and oriented. No focal neurological deficits. Extremities: Symmetric 5 x 5 power. Skin: No rashes, lesions or ulcers Psychiatry: Judgement and insight appear normal. Mood & affect appropriate.    Data Reviewed:  Lab results reviewed.  Family Communication: Wife updated at bedside.  Disposition: Status is: Observation     Time spent: 35 minutes  Author: Murvin Mana, MD 05/07/2024 12:04 PM  For on call review [email or web address].

## 2024-05-08 ENCOUNTER — Other Ambulatory Visit: Payer: Self-pay

## 2024-05-08 DIAGNOSIS — B952 Enterococcus as the cause of diseases classified elsewhere: Secondary | ICD-10-CM | POA: Diagnosis not present

## 2024-05-08 DIAGNOSIS — B9620 Unspecified Escherichia coli [E. coli] as the cause of diseases classified elsewhere: Secondary | ICD-10-CM | POA: Diagnosis not present

## 2024-05-08 DIAGNOSIS — A689 Relapsing fever, unspecified: Secondary | ICD-10-CM | POA: Diagnosis not present

## 2024-05-08 DIAGNOSIS — B965 Pseudomonas (aeruginosa) (mallei) (pseudomallei) as the cause of diseases classified elsewhere: Secondary | ICD-10-CM | POA: Diagnosis not present

## 2024-05-08 DIAGNOSIS — K831 Obstruction of bile duct: Secondary | ICD-10-CM | POA: Diagnosis not present

## 2024-05-08 DIAGNOSIS — R7881 Bacteremia: Secondary | ICD-10-CM | POA: Diagnosis not present

## 2024-05-08 DIAGNOSIS — E119 Type 2 diabetes mellitus without complications: Secondary | ICD-10-CM | POA: Diagnosis not present

## 2024-05-08 LAB — GLUCOSE, CAPILLARY
Glucose-Capillary: 109 mg/dL — ABNORMAL HIGH (ref 70–99)
Glucose-Capillary: 145 mg/dL — ABNORMAL HIGH (ref 70–99)
Glucose-Capillary: 148 mg/dL — ABNORMAL HIGH (ref 70–99)
Glucose-Capillary: 164 mg/dL — ABNORMAL HIGH (ref 70–99)

## 2024-05-08 MED ORDER — CEFTOLOZANE-TAZOBACTAM IV (FOR PTA / DISCHARGE USE): 1.5000 g | Freq: Three times a day (TID) | INTRAVENOUS | 0 refills | Status: AC

## 2024-05-08 MED ORDER — AMOXICILLIN 500 MG PO CAPS
1000.0000 mg | ORAL_CAPSULE | Freq: Three times a day (TID) | ORAL | Status: DC
  Administered 2024-05-08: 1000 mg via ORAL
  Filled 2024-05-08: qty 2

## 2024-05-08 MED ORDER — SODIUM CHLORIDE 0.9 % IV SOLN
1.5000 g | Freq: Three times a day (TID) | INTRAVENOUS | Status: DC
  Administered 2024-05-08 – 2024-05-09 (×2): 1.5 g via INTRAVENOUS
  Filled 2024-05-08 (×3): qty 11.4

## 2024-05-08 MED ORDER — AMOXICILLIN 500 MG PO CAPS
500.0000 mg | ORAL_CAPSULE | Freq: Three times a day (TID) | ORAL | Status: DC
  Administered 2024-05-08 – 2024-05-09 (×2): 500 mg via ORAL
  Filled 2024-05-08 (×3): qty 1

## 2024-05-08 MED ORDER — AMOXICILLIN 500 MG PO CAPS: 500.0000 mg | ORAL_CAPSULE | Freq: Three times a day (TID) | ORAL | 0 refills | Status: AC

## 2024-05-08 NOTE — Progress Notes (Signed)
 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 R UQ Biliary drain intact and secured to gown prior to standing at RW with Supervision. Increased tolerance for activity and gait distance out in hallway with RW and Supervision. Pt able to demonstrate safe negotiation of single step without rails and support of RW with CGA. Initial recs were for STR however pt has shown significant functional improvement and he and wife are requesting to return home. Will continue to see acutely.   If plan is discharge home, recommend the following: A little help with walking and/or transfers;A little help with bathing/dressing/bathroom;Assistance with cooking/housework;Help with stairs or ramp for entrance;Assist for transportation   Can travel by private vehicle     Yes  Equipment Recommendations  None recommended by PT    Recommendations for Other Services       Precautions / Restrictions Precautions Precautions: Fall Recall of Precautions/Restrictions: Intact Restrictions Weight Bearing Restrictions Per Provider Order: No     Mobility  Bed Mobility               General bed mobility comments: NT in chair pre/post session    Transfers Overall transfer level: Needs assistance Equipment used: Rolling walker (2 wheels) Transfers: Sit to/from Stand Sit to Stand: Supervision           General transfer comment:  (No physical assist needed, pt stood on first  attempt)    Ambulation/Gait Ambulation/Gait assistance: Supervision Gait Distance (Feet): 200 Feet Assistive device: Rolling walker (2 wheels) Gait Pattern/deviations: Step-through pattern, Decreased stride length Gait velocity: decreased     General Gait Details: No LOB with light reliance  on RW for balance. Increased tolerance for distance   Stairs Stairs: Yes Stairs assistance: Contact guard assist Stair Management: No rails, Forwards, With walker Number of Stairs: 1 General stair comments: Pt negotiated single step with RW, no rails without difficulty   Wheelchair Mobility     Tilt Bed    Modified Rankin (Stroke Patients Only)       Balance Overall balance assessment: Needs assistance Sitting-balance support: Feet supported, No upper extremity supported Sitting balance-Leahy Scale: Good     Standing balance support: Bilateral upper extremity supported, Reliant on assistive device for balance, During functional activity Standing balance-Leahy Scale: Fair Standing balance comment: Pt reliant on RW to maintain balance in stance and ambulation, but remained steady throughout and no LOBs occured                            Communication Communication Communication: No apparent difficulties  Cognition Arousal: Alert Behavior During Therapy: Flat affect   PT - Cognitive impairments: No apparent impairments                         Following commands: Intact      Cueing Cueing Techniques: Verbal cues, Gestural cues, Visual cues  Exercises Other Exercises Other Exercises: edu pt/wife re:  DME/AE for energy conservation techniques and safe transition home    General Comments General comments (skin integrity, edema, etc.):  (R UQ Biliary tube intact)      Pertinent Vitals/Pain Pain Assessment Pain Assessment: No/denies pain    Home Living                          Prior Function            PT Goals (current goals can now  be found in the care plan section) Acute Rehab PT Goals Patient Stated Goal: to get stronger Progress towards PT goals: Progressing toward goals    Frequency    Min 2X/week      PT Plan      Co-evaluation              AM-PAC PT 6 Clicks Mobility   Outcome Measure  Help needed turning from your back to your side while in a flat bed without using bedrails?: None Help needed moving from lying on your back to sitting on the side of a flat bed without using bedrails?: None Help needed moving to and from a bed to a chair (including a wheelchair)?: A Little Help needed standing up from a chair using your arms (e.g., wheelchair or bedside chair)?: A Little Help needed to walk in hospital room?: A Little Help needed climbing 3-5 steps with a railing? : A Little 6 Click Score: 20    End of Session Equipment Utilized During Treatment: Gait belt Activity Tolerance: Patient tolerated treatment well Patient left: in chair;with call bell/phone within reach;with family/visitor present Nurse Communication: Mobility status PT Visit Diagnosis: Unsteadiness on feet (R26.81);Muscle weakness (generalized) (M62.81);History of falling (Z91.81);Other abnormalities of gait and mobility (R26.89)     Time: 9041-8975 PT Time Calculation (min) (ACUTE ONLY): 26 min  Charges:    $Gait Training: 8-22 mins $Therapeutic Activity: 8-22 mins PT General Charges $$ ACUTE PT VISIT: 1 Visit                    [name], PTA  [name] 05/08/2024, 1:26 PM

## 2024-05-08 NOTE — Treatment Plan (Addendum)
 Diagnosis: Cholangitis Intra abdominal infection Bilary obstruction Baseline Creatinine <1  Culture Result: MDR pseudomonas Enterococcus Faecalis & Hirae  Allergies  Allergen Reactions   Citalopram Nausea And Vomiting    Dizziness, unsteady, balanced issues   Sulfamethoxazole -Trimethoprim      Fatigue and muscle ache Other reaction(s): Other (See Comments) Fatigue and muscle ache   Trimethoprim  Other (See Comments)    Fatigue, hypotension    OPAT Orders Discharge antibiotics: Zerbaxa  ( ceftolozane-tazobactam) 1.5 gms every 8 hours for a total of 2 weeks until 05/21/24 Amoxicillin  500mg   Po TID until  05/17/24  [facility] Per Protocol:  Labs weekly while on IV antibiotics: __ CBC with differential  __ CMP   X__ Please pull PIC at completion of IV antibiotics   Fax weekly lab results  promptly to [phone]  Clinic Follow Up Appt: 05/23/24 at 11.45AM   Call [phone] with any critical valuses or questions

## 2024-05-08 NOTE — Discharge Summary (Signed)
 Physician Discharge Summary   Patient: Casey Peters MRN: [id number] DOB: 12-06-1945  Admit date:     05/01/2024  Discharge date: 05/08/24  Discharge Physician: Murvin Mana   PCP: Rudolpho Norleen BIRCH, MD   Recommendations at discharge:   Follow-up PCP in 1 week.  Discharge Diagnoses: Principal Problem:   Recurrent fever Active Problems:   Type 2 diabetes mellitus (HCC)   Fever   CAD S/P percutaneous coronary angioplasty   HLD (hyperlipidemia)   Controlled type 2 diabetes mellitus without complication (HCC)   Tobacco use   Hypertriglyceridemia   Elevated liver enzymes   E coli bacteremia   Leukocytosis   Weakness   Chronic diastolic CHF (congestive heart failure) (HCC)  Resolved Problems:   * No resolved hospital problems. [facility] Course: Mr. 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 Patient previously had a CBD stent, biliary drain 6/5, exchange of the drain 7/18.  Patient was also treated with ertapenem  7/7 to 7/14 for E. coli septicemia. For the past 2 weeks, patient had intermittent high fever, happens every 48 hours to 72 hours.  Temperature as high as 102. 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 7/23. Etiology still unclear.  Appears that the patient has intermittent fever for the last 2 or 3 weeks, he just finished the ertapenem  for E. coli bacteremia. Since patient had intermittent fever for the last 2 or 3 weeks, I will try to establish diagnosis.  Blood cultures in progress no growth I will send a culture from the biliary drain.  If  this is negative, fever could be caused by PICC line.  Will remove PICC line if culture from the biliary drain is negative. Another possibility is malignancy related. 7/25.  Culture from bile drain now growth Pseudomonas.  Recurrent fever could be secondary to ascending cholangitis.  Meropenem  started.  ID consult obtained. 7/26.  Blood culture growing Enterococcus and Pseudomonas.  Still not finalized.  Will keep patient for another day. Patient doing much better, abdominal pain essentially resolved.  Fever culture resolved since antibiotics.  Per ID, antibiotics has been changed to Zerbaxa  and amoxicillin .  Patient to follow-up with PCP as outpatient.   Chronic abdominal pain. .  To be secondary to gastritis with constipation.  Patient is taking Protonix , will add sucralfate .  Also started stool softener. Had a bowel movement yesterday evening.  Still has intermittent abdominal pain.  Appears to be secondary to biliary drain. Abdominal pain much improved after treating with antibiotics.   E coli bacteremia Antibiotics has completed   Tobacco use As needed nicotine  patch ordered   Controlled type 2 diabetes mellitus without complication (HCC) Continue sliding scale insulin .   Weakness. Physical therapy, patient still does not want nursing home placement.       Consultants: ID Procedures performed: Casey Peters  Disposition: Home health Diet recommendation:  Discharge Diet Orders (From admission, onward)     Start     Ordered   05/08/24 0000  Diet - low sodium heart healthy        05/08/24 1655           Cardiac diet DISCHARGE MEDICATION: Allergies as of 05/08/2024  Reactions   Citalopram Nausea And Vomiting   Dizziness, unsteady, balanced issues   Sulfamethoxazole -trimethoprim     Fatigue and muscle ache Other reaction(s): Other (See Comments) Fatigue and muscle ache   Trimethoprim  Other (See Comments)   Fatigue, hypotension        Medication List     STOP  taking these medications    ertapenem  IVPB Commonly known as: INVANZ    senna 8.6 MG Tabs tablet Commonly known as: SENOKOT       TAKE these medications    amoxicillin  500 MG capsule Commonly known as: AMOXIL  Take 1 capsule (500 mg total) by mouth every 8 (eight) hours for 9 days.   aspirin  EC 81 MG tablet Take 81 mg by mouth daily.   carvedilol  6.25 MG tablet Commonly known as: COREG  Take 6.25 mg by mouth 2 (two) times daily with a meal.   ceftolozane-tazobactam IVPB Commonly known as: ZERBAXA  Inject 1.5 g into the vein every 8 (eight) hours for 13 days. 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 Please pull PICC/midline at completion of IV antibiotics Call [phone] with any critical values or questions   furosemide  40 MG tablet Commonly known as: LASIX  Take 40 mg by mouth daily as needed.   Geri-Lanta 200-200-20 MG/5ML suspension Generic drug: alum & mag hydroxide-simeth Take 15 mLs by mouth every 6 (six) hours as needed for indigestion or heartburn.   glimepiride 2 MG tablet Commonly known as: AMARYL Take 2 mg by mouth daily as needed.   Januvia 50 MG tablet Generic drug: sitaGLIPtin Take 1 tablet by mouth daily.   ondansetron  8 MG tablet Commonly known as: ZOFRAN  One pill every 8 hours as needed for nausea/vomitting.   oxyCODONE  5 MG immediate release tablet Commonly known as: Oxy IR/ROXICODONE  Take 1 tablet (5 mg total) by mouth every 8 (eight) hours as needed for severe pain (pain score 7-10).   pantoprazole  40 MG  tablet Commonly known as: PROTONIX  Take 1 tablet (40 mg total) by mouth 2 (two) times daily for 30 days, THEN 1 tablet (40 mg total) daily. Start taking on: April 24, 2024   silodosin  8 MG Caps capsule Commonly known as: RAPAFLO  Take 1 capsule (8 mg total) by mouth daily with breakfast.   spironolactone  25 MG tablet Commonly known as: ALDACTONE  Take 12.5 mg by mouth daily.   testosterone  50 MG/5GM (1%) Gel Commonly known as: ANDROGEL  Place 5 g onto the skin daily. Please confirm with your cardiologist before resuming   Vitamin D  (Ergocalciferol ) 1.25 MG (50000 UNIT) Caps capsule Commonly known as: DRISDOL  Take 1 capsule (50,000 Units total) by mouth every 7 (seven) days.               Discharge Care Instructions  (From admission, onward)           Start     Ordered   05/08/24 0000  Change dressing on IV access line weekly and PRN  (Home infusion instructions - [facility] )        05/08/24 1655   05/08/24 0000  Discharge wound care:       Comments:  Follow with PCP   05/08/24 1655            Follow-up Information     [facility] Follow up.   Specialty: Home Health Services Why: 07/23---Per [facility] will continue to follow. Contact information: [address] [place] [address] [phone]         Rudolpho Norleen BIRCH, MD Follow up in 1 week(s).   Specialty: Internal Medicine Contact information: [address] [facility] [place] [address] [phone]                Discharge Exam: Filed Weights   05/01/24 1027  Weight: 81.4 kg   General exam: Appears calm and comfortable  Respiratory system: Clear to auscultation. Respiratory effort normal. Cardiovascular system: S1 & S2 heard, RRR. No JVD, murmurs, rubs, gallops or clicks. No pedal edema. Gastrointestinal system: Abdomen is nondistended, soft and nontender. No organomegaly or masses felt. Normal bowel sounds  heard. Central nervous system: Alert and oriented. No focal neurological deficits. Extremities: Symmetric 5 x 5 power. Skin: No rashes, lesions or ulcers Psychiatry: Judgement and insight appear normal. Mood & affect appropriate.    Condition at discharge: good  The results of significant diagnostics from this hospitalization (including imaging, microbiology, ancillary and laboratory) are listed below for reference.   Imaging Studies: DG Chest Port 1 View Result Date: 05/01/2024 CLINICAL DATA:  Leukocytosis EXAM: PORTABLE CHEST 1 VIEW COMPARISON:  04/26/2024 FINDINGS: Mildly degraded exam due to AP portable technique and patient body habitus. Reverse apical lordotic positioning. Cardiomegaly accentuated by AP portable technique. No pleural effusion or pneumothorax. Low lung volumes with resultant pulmonary interstitial prominence. Improved left lower lobe airspace disease. Possible left mid lung subtle pulmonary opacity. IMPRESSION: Improved left lower lobe airspace disease. Possible developing left mid lung atelectasis or infection. Decreased sensitivity and specificity exam due to technique related factors, as described above. Electronically Signed   By: Rockey Kilts M.D.   On: 05/01/2024 14:38   IR EXCHANGE BILIARY DRAIN Result Date: 04/28/2024 INDICATION: Elevated LFTs. Briefly, 78 year old male with a history of biliary obstruction from hilar mass, question cholangiocarcinoma. Prior biliary drain placed 04/15/2024. EXAM: Procedures: 1. PERCUTANEOUS BILIARY DRAINAGE CATHETER EXCHANGE and UPSIZE 2. FLUOROSCOPIC-GUIDED COMMON BILE DUCT BRUSH BIOPSIES COMPARISON:  COMPARISON CT embolus, 03/28/2024.  IR fluoroscopy, 03/16/2024. CONTRAST:  12 mL Omnipaque -300 administered into the collecting system FLUOROSCOPY: Radiation Exposure Index and estimated peak skin dose (PSD); Reference air kerma (RAK), 29.3 mGy. COMPLICATIONS: Casey Peters immediate. TECHNIQUE: Informed written consent was obtained from the  patient and/or patient's representative after a discussion of the risks, benefits and alternatives to treatment. Questions regarding the procedure were encouraged and answered. A timeout was performed prior to the initiation of the procedure. The external portion of the existing RIGHT-sided percutaneous biliary drainage catheter as well as the surrounding skin were prepped and draped in the usual sterile fashion. A sterile drape was applied covering the operative field. Maximum barrier sterile technique with sterile gowns and gloves were used for the procedure. A timeout was performed prior to the initiation of the procedure. A pre procedural spot fluoroscopic image was obtained after contrast was injected via the existing biliary drainage catheter. The existing biliary drainage catheter was cut and cannulated with a stiff Glidewire wire which was coiled within the proximal small bowel. Under intermittent fluoroscopic guidance, the existing PBD was removed and a 8 Fr sheath was placed. Attention was then directed to the brush biopsy. Using a  coaxial brush biopsy kit, multiple passes of the hepatic hilum and proximal common bile duct were performed. The brush was sheath and removed. Under intermittent fluoroscopic guidance and over the Amplatz wire, the track was dilated ultimately allowing placement of a 12 Fr biliary drainage catheter with coil ultimately locked within the duodenum. Contrast was injected and a completion radiographs were obtained in various obliquities. The catheter was connected to a drainage bag which yielded the brisk return of clear bile. The catheter was secured to the skin with an interrupted suture and StatLock device. Dressings were applied. The patient tolerated the procedure well without immediate postprocedural complication. FINDINGS: *The existing percutaneous biliary catheter is appropriately positioned and functioning. *Limited contrast injection demonstrates no dilatation of the  biliary tree, with communication between the right and left hepatic ducts. *Stricturing at the hepatic hilum, with brush biopsies performed as requested. Additionally, 10 mL of bile was submitted for cytological analysis. *After successful fluoroscopic guided exchange and upsize, a new 12 Fr percutaneous biliary catheter is appropriately positioned with end coiled and locked within the proximal duodenum. IMPRESSION: 1. Successful exchange and upsize for a 12 Fr percutaneous biliary drainage catheter, with end coiled and locked within the duodenum. 2. Hepatic hilar stricture, with brush biopsies performed and bile submitted for cytological analysis. RECOMMENDATIONS: The patient will return to Vascular Interventional Radiology (VIR) for routine drainage catheter evaluation and exchange in 6-8 weeks. Thom Hall, MD [facility] Electronically Signed   By: Thom Hall M.D.   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 Status post placement of right-sided internal/external biliary drain on 03/16/2024 with placement of 10 French drainage catheter. Some fever and elevated bilirubin today. EXAM: CT ABDOMEN AND PELVIS WITH CONTRAST TECHNIQUE: Multidetector CT imaging of the abdomen and pelvis was performed using the standard protocol following bolus administration of intravenous contrast. RADIATION DOSE REDUCTION: This exam was performed according to the departmental dose-optimization program which includes automated exposure control, adjustment of the mA and/or kV according to patient size and/or use of iterative reconstruction technique. CONTRAST:  OMNIPAQUE  IOHEXOL  300 MG/ML  SOLN COMPARISON:  04/18/2024 FINDINGS: Lower chest: Mild atelectasis at the posterior right lung base. No pleural effusions.  Hepatobiliary: Positioning of a right-sided internal/external biliary drainage catheter is stable entering the right lobe of the liver and extending through the common bile duct and into the duodenum. There is no evidence of biliary obstruction with decompressed right-sided bile ducts and chronic atrophy of the left lobe. The gallbladder is contracted. The previously noted old endoscopic biliary stent has migrated further and is now almost entirely in the duodenum with a short segment remaining in the distal CBD. Pancreas: Unremarkable. No pancreatic ductal dilatation or surrounding inflammatory changes. Spleen: Normal in size without focal abnormality. Adrenals/Urinary Tract: Adrenal glands are unremarkable. Kidneys are normal, without renal calculi, focal lesion, or hydronephrosis. Bladder is unremarkable. Stomach/Bowel: Bowel shows no evidence of obstruction, ileus, inflammation or lesion. The appendix is not discretely visualized. No free intraperitoneal air. Vascular/Lymphatic: Mild atherosclerosis of the abdominal aorta without aneurysm. No lymphadenopathy identified in the abdomen or pelvis. Reproductive: Stable prostate enlargement. Other: No abdominal wall hernia or abnormality. No abdominopelvic ascites. Musculoskeletal: No acute or significant osseous findings. IMPRESSION: 1. Stable positioning of right-sided internal/external biliary drainage catheter. No evidence of biliary obstruction with decompressed right-sided bile ducts and  chronic atrophy of the left lobe. 2. The previously noted old endoscopic biliary stent has migrated further and is now almost entirely in the duodenum with a short segment remaining in the distal CBD. 3. Mild atherosclerosis of the abdominal aorta without aneurysm. 4. Stable prostate enlargement. Electronically Signed   By: Marcey Moan M.D.   On: 04/27/2024 10:46   US  Abdomen Limited RUQ (LIVER/GB) Result Date: 04/27/2024 CLINICAL DATA:  100030 Leukocytosis 100030 ,  biliary stenosis/obstruction status post ERCP EXAM: ULTRASOUND ABDOMEN LIMITED RIGHT UPPER QUADRANT COMPARISON:  April 18, 2024 FINDINGS: Gallbladder: Decompressed. No gallstones. Echogenic structure in the gallbladder fundus with dirty shadowing, likely intraluminal gas. No wall thickening or pericholecystic fluid. No sonographic Murphy's sign noted by sonographer. Common bile duct: Diameter: 8 mm.  Biliary duct stent in place. Liver: Normal echogenicity. No focal lesion identified. No intrahepatic biliary ductal dilation. Portal vein is patent on color Doppler imaging with normal direction of blood flow towards the liver. Right Kidney: Partially visualized. No mass. No hydronephrosis or nephrolithiasis. Other: Casey Peters. IMPRESSION: 1. Decompressed gallbladder with a small amount of gas in the lumen. No cholecystolithiasis or changes of acute cholecystitis. 2. Partially visualized biliary duct stent in place. No intrahepatic biliary ductal dilation. Electronically Signed   By: Rogelia Myers M.D.   On: 04/27/2024 10:01   DG Chest Portable 1 View Result Date: 04/26/2024 CLINICAL DATA:  Fever. EXAM: PORTABLE CHEST 1 VIEW COMPARISON:  12/27/2021. FINDINGS: Low lung volume. Mild diffuse pulmonary vascular congestion, overall less in intensity when compared to the prior exam from 12/27/2021. For there are atelectatic changes at the left lung base. Bilateral lung fields are otherwise clear. No acute consolidation or lung collapse. Bilateral costophrenic angles are clear. Stable cardio-mediastinal silhouette. No acute osseous abnormalities. The soft tissues are within normal limits. IMPRESSION: *Mild diffuse pulmonary vascular congestion, overall less in intensity when compared to the prior exam from 12/27/2021. No acute consolidation or lung collapse. Electronically Signed   By: Ree Molt M.D.   On: 04/26/2024 16:33   US  EKG SITE RITE Result Date: 04/24/2024 If Site Rite image not attached, placement could not be  confirmed due to current cardiac rhythm.  ECHOCARDIOGRAM COMPLETE Result Date: 04/18/2024    ECHOCARDIOGRAM REPORT   Patient Name:   Casey Peters Date of Exam: 04/18/2024 Medical Rec #:  [id number]            Height:       71.0 in Accession #:    [id number]           Weight:       187.0 lb Date of Birth:  1946-05-12            BSA:          2.049 m Patient Age:    78 years             BP:           86/53 mmHg Patient Gender: M                    HR:           99 bpm. Exam Location:  [facility] Procedure: 2D Echo, Color Doppler, Cardiac Doppler and Intracardiac            Opacification Agent (Both Spectral and Color Flow Doppler were            utilized during procedure). Indications:     CHF-Acute Systolic I50.21  History:  Patient has prior history of Echocardiogram examinations, most                  recent 12/28/2021. CHF.  Sonographer:     Ashley McNeely-Sloane Referring Phys:  [id number] ELVAN SOR Diagnosing Phys: Dwayne D Callwood MD IMPRESSIONS  1. Left ventricular ejection fraction, by estimation, is 50 to 55%. The left ventricle has low normal function. The left ventricle has no regional wall motion abnormalities. Left ventricular diastolic parameters are consistent with Grade I diastolic dysfunction (impaired relaxation).  2. Right ventricular systolic function is normal. The right ventricular size is normal.  3. The mitral valve is normal in structure. Trivial mitral valve regurgitation.  4. The aortic valve is normal in structure. Aortic valve regurgitation is not visualized. FINDINGS  Left Ventricle: Left ventricular ejection fraction, by estimation, is 50 to 55%. The left ventricle has low normal function. The left ventricle has no regional wall motion abnormalities. Definity  contrast agent was given IV to delineate the left ventricular endocardial borders. Strain was performed and the global longitudinal strain is indeterminate. Global longitudinal strain performed but not reported based on  interpreter judgement due to suboptimal tracking. The left ventricular internal cavity size was normal in size. There is borderline left ventricular hypertrophy. Left ventricular diastolic parameters are consistent with Grade I diastolic dysfunction (impaired relaxation). Right Ventricle: The right ventricular size is normal. No increase in right ventricular wall thickness. Right ventricular systolic function is normal. Left Atrium: Left atrial size was normal in size. Right Atrium: Right atrial size was normal in size. Pericardium: There is no evidence of pericardial effusion. Mitral Valve: The mitral valve is normal in structure. Trivial mitral valve regurgitation. MV peak gradient, 3.1 mmHg. The mean mitral valve gradient is 2.0 mmHg. Tricuspid Valve: The tricuspid valve is normal in structure. Tricuspid valve regurgitation is mild. Aortic Valve: The aortic valve is normal in structure. Aortic valve regurgitation is not visualized. Aortic valve mean gradient measures 2.0 mmHg. Aortic valve peak gradient measures 3.4 mmHg. Aortic valve area, by VTI measures 4.02 cm. Pulmonic Valve: The pulmonic valve was normal in structure. Pulmonic valve regurgitation is not visualized. Aorta: The ascending aorta was not well visualized. IAS/Shunts: No atrial level shunt detected by color flow Doppler. Additional Comments: 3D was performed not requiring image post processing on an independent workstation and was indeterminate.  LEFT VENTRICLE PLAX 2D LVIDd:         5.10 cm     Diastology LVIDs:         3.13 cm     LV e' medial:    7.18 cm/s LV PW:         1.10 cm     LV E/e' medial:  10.8 LV IVS:        1.10 cm     LV e' lateral:   9.46 cm/s LVOT diam:     2.30 cm     LV E/e' lateral: 8.2 LV SV:         60 LV SV Index:   29 LVOT Area:     4.15 cm  LV Volumes (MOD) LV vol d, MOD A2C: 98.3 ml LV vol d, MOD A4C: 81.0 ml LV vol s, MOD A2C: 44.8 ml LV vol s, MOD A4C: 61.3 ml LV SV MOD A2C:     53.5 ml LV SV MOD A4C:     81.0 ml LV  SV MOD BP:      41.5 ml RIGHT VENTRICLE RV Basal  diam:  3.90 cm RV Mid diam:    3.20 cm RV S prime:     10.30 cm/s TAPSE (M-mode): 1.8 cm LEFT ATRIUM             Index        RIGHT ATRIUM           Index LA diam:        3.60 cm 1.76 cm/m   RA Area:     18.20 cm LA Vol (A2C):   42.2 ml 20.59 ml/m  RA Volume:   50.70 ml  24.74 ml/m LA Vol (A4C):   34.5 ml 16.84 ml/m LA Biplane Vol: 39.8 ml 19.42 ml/m  AORTIC VALVE                    PULMONIC VALVE AV Area (Vmax):    3.64 cm     PV Vmax:        0.87 m/s AV Area (Vmean):   3.53 cm     PV Vmean:       62.600 cm/s AV Area (VTI):     4.02 cm     PV VTI:         0.155 m AV Vmax:           92.00 cm/s   PV Peak grad:   3.1 mmHg AV Vmean:          64.200 cm/s  PV Mean grad:   2.0 mmHg AV VTI:            0.150 m      RVOT Peak grad: 2 mmHg AV Peak Grad:      3.4 mmHg AV Mean Grad:      2.0 mmHg LVOT Vmax:         80.70 cm/s LVOT Vmean:        54.500 cm/s LVOT VTI:          0.145 m LVOT/AV VTI ratio: 0.97  AORTA Ao Root diam: 3.60 cm Ao Asc diam:  3.10 cm MITRAL VALVE MV Area (PHT): 5.54 cm     SHUNTS MV Area VTI:   2.63 cm     Systemic VTI:  0.14 m MV Peak grad:  3.1 mmHg     Systemic Diam: 2.30 cm MV Mean grad:  2.0 mmHg     Pulmonic VTI:  0.108 m MV Vmax:       0.88 m/s MV Vmean:      61.5 cm/s MV Decel Time: 137 msec MV E velocity: 77.60 cm/s MV A velocity: 105.00 cm/s MV E/A ratio:  0.74 Dwayne D Callwood MD Electronically signed by Cara JONETTA Lovelace MD Signature Date/Time: 04/18/2024/10:22:11 PM    Final    US  RENAL Result Date: 04/18/2024 CLINICAL DATA:  [id number] AKI (acute kidney injury) (HCC) [id number] EXAM: RENAL / URINARY TRACT ULTRASOUND COMPLETE COMPARISON:  Mar 10, 2024 FINDINGS: Right Kidney: Renal measurements: 13.1 x 6.3 x 10.3 cm = volume: 273 mL. Normal echogenicity. 1.2 x 0.8 x 0.9 cm cyst in the upper pole. No hydronephrosis or nephrolithiasis. Left Kidney: Renal measurements: 12 x 5.4 x 5.3 cm = volume: 181 mL. Normal echogenicity. A couple of small  cysts in the left kidney measuring up to 1.6 x 1.4 x 1.6 cm. No hydronephrosis or nephrolithiasis. Bladder: Circumferential wall thickening with trabeculation. Abnormal postvoid residual of 121 mL. Other: Casey Peters. IMPRESSION: 1. No hydronephrosis or nephrolithiasis. 2. Circumferential wall thickening of the urinary bladder, which in the  setting of prostatomegaly, may represent changes of chronic bladder outlet obstruction. If there is concern for acute cystitis, correlation with urinalysis would be recommended. 3. Abnormal postvoid residual of 121 mL. Electronically Signed   By: Rogelia Myers M.D.   On: 04/18/2024 16:00   CT Angio Chest PE W/Cm &/Or Wo Cm Result Date: 04/18/2024 CLINICAL DATA:  Pulmonary embolus suspected with high probability. Fever developed after biliary procedure today. EXAM: CT ANGIOGRAPHY CHEST CT ABDOMEN AND PELVIS WITH CONTRAST TECHNIQUE: Multidetector CT imaging of the chest was performed using the standard protocol during bolus administration of intravenous contrast. Multiplanar CT image reconstructions and MIPs were obtained to evaluate the vascular anatomy. Multidetector CT imaging of the abdomen and pelvis was performed using the standard protocol during bolus administration of intravenous contrast. RADIATION DOSE REDUCTION: This exam was performed according to the departmental dose-optimization program which includes automated exposure control, adjustment of the mA and/or kV according to patient size and/or use of iterative reconstruction technique. CONTRAST:  OMNIPAQUE  IOHEXOL  350 MG/ML SOLN COMPARISON:  PET CT 04/12/2024.  CT chest 08/14/2020 FINDINGS: CTA CHEST FINDINGS Cardiovascular: Technically adequate study with good opacification of the central and segmental pulmonary arteries. Moderate motion artifact. No focal filling defects are demonstrated. No evidence of significant pulmonary embolus. Normal heart size. No pericardial effusions. Normal caliber thoracic aorta. No  dissection. Great vessel origins are patent. Mediastinum/Nodes: No enlarged mediastinal, hilar, or axillary lymph nodes. Thyroid gland, trachea, and esophagus demonstrate no significant findings. Lungs/Pleura: Patchy peripheral infiltrates in the lungs may represent scarring or multifocal pneumonia. Similar appearance to previous study. No pleural effusion or pneumothorax. 7 mm diameter nodule in the right middle lung, series 3, image 70. Additional smaller nodule seen in both lungs. Musculoskeletal: Degenerative changes in the spine. No acute bony abnormalities. Review of the MIP images confirms the above findings. CT ABDOMEN and PELVIS FINDINGS Hepatobiliary: Residual contrast material demonstrated within mildly dilated intrahepatic bile ducts in the left lobe. A percutaneous biliary drainage catheter is present with tip in the duodenum. No focal liver lesions are seen. Extrahepatic bile ducts are not dilated. Gas in the gallbladder is likely result of procedure. Pancreas: Unremarkable. No pancreatic ductal dilatation or surrounding inflammatory changes. Spleen: Normal in size without focal abnormality. Adrenals/Urinary Tract: Adrenal glands are unremarkable. Kidneys are normal, without renal calculi, focal lesion, or hydronephrosis. Bladder is unremarkable. Stomach/Bowel: Stomach, small bowel, and colon are not abnormally distended. No wall thickening or inflammatory changes. Appendix is not identified. Vascular/Lymphatic: Aortic atherosclerosis. No enlarged abdominal or pelvic lymph nodes. Reproductive: Prostate gland is enlarged Other: No free air or free fluid in the abdomen. No loculated collections are identified. Abdominal wall musculature appears intact. Musculoskeletal: Degenerative changes in the spine. No acute bony abnormalities. Review of the MIP images confirms the above findings. IMPRESSION: 1. Postprocedural changes in the liver with percutaneous biliary drain in place. Tip is in the duodenum.  Residual contrast material within mildly dilated left intrahepatic bile ducts. Gas in the gallbladder is also likely postprocedural. 2. No abscess or free air in the abdomen. 3. No evidence of significant pulmonary embolus. 4. Patchy peripheral infiltrates in the lungs possibly representing multifocal pneumonia or scarring. 5. Scattered nodules in the lungs, largest in the right middle lung measuring 7 mm diameter. Non-contrast chest CT at 3-6 months is recommended. If the nodules are stable at time of repeat CT, then future CT at 18-24 months (from today's scan) is considered optional for low-risk patients, but is recommended for high-risk  patients. This recommendation follows the consensus statement: Guidelines for Management of Incidental Pulmonary Nodules Detected on CT Images: From the Fleischner Society 2017; Radiology 2017; 284:228-243. 6. Aortic atherosclerosis. 7. 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 Electronically Signed   By: Elsie Gravely M.D.   On: 04/18/2024 02:13   CT ABDOMEN PELVIS W CONTRAST Result Date: 04/18/2024 CLINICAL DATA:  Pulmonary embolus suspected with high probability. Fever developed after biliary procedure today. EXAM: CT ANGIOGRAPHY CHEST CT ABDOMEN AND PELVIS WITH CONTRAST TECHNIQUE: Multidetector CT imaging of the chest was performed using the standard protocol during bolus administration of intravenous contrast. Multiplanar CT image reconstructions and MIPs were obtained to evaluate the vascular anatomy. Multidetector CT imaging of the abdomen and pelvis was performed using the standard protocol during bolus administration of intravenous contrast. RADIATION DOSE REDUCTION: This exam was performed according to the departmental dose-optimization program which includes automated exposure control, adjustment of the mA and/or kV according to patient size and/or use of iterative reconstruction technique. CONTRAST:  OMNIPAQUE  IOHEXOL  350 MG/ML SOLN COMPARISON:  PET CT 04/12/2024.  CT  chest 08/14/2020 FINDINGS: CTA CHEST FINDINGS Cardiovascular: Technically adequate study with good opacification of the central and segmental pulmonary arteries. Moderate motion artifact. No focal filling defects are demonstrated. No evidence of significant pulmonary embolus. Normal heart size. No pericardial effusions. Normal caliber thoracic aorta. No dissection. Great vessel origins are patent. Mediastinum/Nodes: No enlarged mediastinal, hilar, or axillary lymph nodes. Thyroid gland, trachea, and esophagus demonstrate no significant findings. Lungs/Pleura: Patchy peripheral infiltrates in the lungs may represent scarring or multifocal pneumonia. Similar appearance to previous study. No pleural effusion or pneumothorax. 7 mm diameter nodule in the right middle lung, series 3, image 70. Additional smaller nodule seen in both lungs. Musculoskeletal: Degenerative changes in the spine. No acute bony abnormalities. Review of the MIP images confirms the above findings. CT ABDOMEN and PELVIS FINDINGS Hepatobiliary: Residual contrast material demonstrated within mildly dilated intrahepatic bile ducts in the left lobe. A percutaneous biliary drainage catheter is present with tip in the duodenum. No focal liver lesions are seen. Extrahepatic bile ducts are not dilated. Gas in the gallbladder is likely result of procedure. Pancreas: Unremarkable. No pancreatic ductal dilatation or surrounding inflammatory changes. Spleen: Normal in size without focal abnormality. Adrenals/Urinary Tract: Adrenal glands are unremarkable. Kidneys are normal, without renal calculi, focal lesion, or hydronephrosis. Bladder is unremarkable. Stomach/Bowel: Stomach, small bowel, and colon are not abnormally distended. No wall thickening or inflammatory changes. Appendix is not identified. Vascular/Lymphatic: Aortic atherosclerosis. No enlarged abdominal or pelvic lymph nodes. Reproductive: Prostate gland is enlarged Other: No free air or free fluid  in the abdomen. No loculated collections are identified. Abdominal wall musculature appears intact. Musculoskeletal: Degenerative changes in the spine. No acute bony abnormalities. Review of the MIP images confirms the above findings. IMPRESSION: 1. Postprocedural changes in the liver with percutaneous biliary drain in place. Tip is in the duodenum. Residual contrast material within mildly dilated left intrahepatic bile ducts. Gas in the gallbladder is also likely postprocedural. 2. No abscess or free air in the abdomen. 3. No evidence of significant pulmonary embolus. 4. Patchy peripheral infiltrates in the lungs possibly representing multifocal pneumonia or scarring. 5. Scattered nodules in the lungs, largest in the right middle lung measuring 7 mm diameter. Non-contrast chest CT at 3-6 months is recommended. If the nodules are stable at time of repeat CT, then future CT at 18-24 months (from today's scan) is considered optional for low-risk patients, but is recommended for high-risk  patients. This recommendation follows the consensus statement: Guidelines for Management of Incidental Pulmonary Nodules Detected on CT Images: From the Fleischner Society 2017; Radiology 2017; 284:228-243. 6. Aortic atherosclerosis. 7. 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 Electronically Signed   By: Elsie Gravely M.D.   On: 04/18/2024 02:13   DG C-Arm 1-60 Min-No Report Result Date: 04/17/2024 Fluoroscopy was utilized by the requesting physician.  No radiographic interpretation.   NM PET Image Initial (PI) Skull Base To Thigh (F-18 FDG) Addendum Date: 04/12/2024 ADDENDUM REPORT: 04/12/2024 11:23 ADDENDUM: Additional impression. There is some asymmetric uptake in the area of the sella turcica/pituitary. Dedicated brain imaging could be performed with MRI as clinically appropriate to exclude lesion. Please correlate for any history Electronically Signed   By: Ranell Bring M.D.   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 EXAM: NUCLEAR MEDICINE PET SKULL BASE TO THIGH TECHNIQUE: 10.5 mCi F-18 FDG was injected intravenously. Full-ring PET imaging was performed from the skull base to thigh after the radiotracer. CT data was obtained and used for attenuation correction and anatomic localization. Fasting blood glucose: 108 mg/dl COMPARISON:  MRI abdomen 03/14/2024. CT abdomen pelvis 03/10/2024. CTA chest 08/14/2020. FINDINGS: Mediastinal blood pool activity: SUV max 2.5 Liver activity: SUV max 3.2 NECK: There is no specific abnormal uptake identified in the neck including along lymph node change of the submandibular, posterior triangle or internal jugular regions. Near symmetric uptake of the visualized intracranial compartment. However there is some uptake in the area of the sella. Please correlate for any known history. Dedicated MRI could be performed clinically appropriate. Incidental CT findings: Visualized paranasal sinuses and mastoid air cells are clear. Left sided concha bullosa. The parotid glands, submandibular glands and thyroid gland are unremarkable. Scattered vascular calcifications. CHEST: No specific abnormal radiotracer uptake identified above blood pool in the axillary regions, hilum or mediastinum. There is a small focus of uptake within the left upper lobe with maximum SUV of 2.3. This is seen on PET image 38. There is no CT correlate to a nodule in this location. Diameter of uptake approaches 6 mm. There is a nodule in the middle lobe however on CT image 54 measuring 5 mm which does not show any abnormal uptake and was present on the CT of 2021 demonstrating long-term stability. Incidental CT findings: Breathing motion. There is some linear opacity lung bases likely scar or atelectasis. Some wispy ground-glass areas are identified which could be scarring or fibrotic changes based on the prior CT scan. Heart is enlarged. Scattered vascular  calcifications are seen including along the coronary arteries. Patulous esophagus. ABDOMEN/PELVIS: There is a focal area of uptake which is somewhat linear in configuration with maximum SUV value of 8.0 centered at the hilum of the liver extending to the left hepatic lobe which is in the location of the stricture seen on the previous MRI and may correspond to the area of neoplasm. There is a more subtle area of uptake identified towards the ampulla measuring maximum SUV of 6.2 but this is in the exact location of the stents. One being a right hepatic lobe PTC in the other endoscopic stent extending towards the left. This could be more artifactual as there is no clear mass lesion in this location on previous exam. Elsewhere there is physiologic distribution radiotracer along the parenchymal organs, bowel and renal collecting systems. No clear abnormal nodal uptake identified. Incidental CT findings: S dated placement of biliary stents. The biliary  ductal dilatation in the right hepatic lobe is improved. The left hepatic lobe is also slightly improved with some residual. There is atrophy of the left hepatic lobe lateral segment. Air in the nondilated gallbladder. Mild global atrophy of the pancreas. The spleen, adrenal glands are grossly preserved on this noncontrast attenuation correction CT. Renal cysts are again identified as on prior MRI. No abnormal calcifications seen within either kidney nor along the course of either ureter. Underdistended urinary bladder. Heterogeneous enlarged prostate. Large bowel has a normal course and caliber with scattered stool. Colonic diverticula identified. Stomach is underdistended. Small bowel is nondilated. Of note the cecum resides in the anterior right hemipelvis. Scattered vascular calcifications. Normal caliber aorta and IVC. Few small scattered nodes identified in the abdomen pelvis, not pathologic by size criteria. Small fat containing inguinal hernias. SKELETON: No  abnormal uptake along the visualized osseous structures. Incidental CT findings: Scattered diffuse degenerative changes identified. Curvature of the spine. IMPRESSION: Focal area of uptake identified in the hilum of the liver at the location of strictures seen by previous imaging consistent with area of neoplasm. No other areas of abnormal uptake at this time suggest additional areas of disease. Indwelling right hepatic lobe PTC and endoscopic left-sided stent. Some improvement of the intrahepatic biliary ductal dilatation with residual particularly along the left hepatic lobe. Left hepatic lobe is also atrophic. There is a small focus of asymmetric uptake along the left upper lobe of the lung without a corresponding lung nodule on CT scan. In the setting recommend a dedicated high-resolution CT scan for further spacer resolution to see if there is an underlying lesion versus a follow up CT in 3 months. Residual areas of scarring and fibrotic changes in a similar distribution to the abnormality on prior remote CT scan of the chest. There is a right-sided lung nodule which is stable since 2021 has no abnormal uptake. No additional follow-up of this specific nodule. Electronically Signed: By: Ranell Bring M.D. On: 04/12/2024 10:38    Microbiology: Results for orders placed or performed during the hospital encounter of 05/01/24  Culture, blood (Routine x 2)     Status: Casey Peters   Collection Time: 05/01/24 12:00 PM   Specimen: BLOOD  Result Value Ref Range Status   Specimen Description BLOOD BLOOD LEFT FOREARM  Final   Special Requests   Final    BOTTLES DRAWN AEROBIC AND ANAEROBIC Blood Culture results may not be optimal due to an inadequate volume of blood received in culture bottles   Culture   Final    NO GROWTH 5 DAYS Performed at [facility], [address] [place], [place] [address]    Report Status 05/06/2024 FINAL  Final  Culture, blood (Routine x 2)     Status: Casey Peters   Collection Time:  05/01/24  1:19 PM   Specimen: BLOOD  Result Value Ref Range Status   Specimen Description BLOOD BLOOD LEFT ARM  Final   Special Requests   Final    BOTTLES DRAWN AEROBIC AND ANAEROBIC Blood Culture results may not be optimal due to an inadequate volume of blood received in culture bottles   Culture   Final    NO GROWTH 5 DAYS Performed at [facility], [address], [place], [place] [address]    Report Status 05/06/2024 FINAL  Final  Body fluid culture w Gram Stain     Status: Casey Peters (Preliminary result)   Collection Time: 05/03/24 11:30 AM   Specimen: Gallbladder; Body Fluid  Result Value Ref Range Status   Specimen Description   Final    GALL BLADDER Performed at [facility], [address] [place], [place] [address]    Special Requests   Final    Casey Peters Performed at [facility], [address] [place], [place] [address]    Gram Stain   Final    NO WBC SEEN FEW GRAM POSITIVE COCCI FEW GRAM POSITIVE RODS RARE GRAM NEGATIVE RODS    Culture   Final    MODERATE PSEUDOMONAS AERUGINOSA MODERATE ENTEROCOCCUS FAECALIS MODERATE ENTEROCOCCUS HIRAE PSEUDOMONAS AERUGINOSA  MULTI-DRUG RESISTANT ORGANISM HEALTH DEPARTMENT NOTIFIED Sent to [facility] for further susceptibility testing. Performed at [facility], [address], [place], [place] [address]    Report Status PENDING  Incomplete   Organism ID, Bacteria ENTEROCOCCUS FAECALIS  Final   Organism ID, Bacteria ENTEROCOCCUS HIRAE  Final   Organism ID, Bacteria PSEUDOMONAS AERUGINOSA  Final      Susceptibility   Enterococcus faecalis - MIC*    AMPICILLIN <=2 SENSITIVE Sensitive     VANCOMYCIN  1 SENSITIVE Sensitive     GENTAMICIN  SYNERGY SENSITIVE Sensitive     * MODERATE ENTEROCOCCUS FAECALIS   Enterococcus hirae - MIC*    AMPICILLIN <=2 SENSITIVE Sensitive     VANCOMYCIN  <=0.5 SENSITIVE Sensitive     GENTAMICIN  SYNERGY SENSITIVE Sensitive     * MODERATE ENTEROCOCCUS HIRAE   Pseudomonas  aeruginosa - MIC*    CEFTAZIDIME 16 INTERMEDIATE Intermediate     CIPROFLOXACIN  1 INTERMEDIATE Intermediate     GENTAMICIN  <=1 SENSITIVE Sensitive     IMIPENEM >=16 RESISTANT Resistant     * MODERATE PSEUDOMONAS AERUGINOSA    Labs: CBC: Recent Labs  Lab 05/02/24 0621  WBC 9.0  HGB 10.9*  HCT 32.9*  MCV 92.7  PLT 340   Basic Metabolic Panel: Recent Labs  Lab 05/02/24 0621 05/06/24 0516  NA 135  --   K 3.8  --   CL 101  --   CO2 26  --   GLUCOSE 117*  --   BUN 14  --   CREATININE 0.94 0.99  CALCIUM  8.4*  --    Liver Function Tests: No results for input(s): AST, ALT, ALKPHOS, BILITOT, PROT, ALBUMIN in the last 168 hours. CBG: Recent Labs  Lab 05/07/24 1135 05/07/24 1631 05/07/24 2129 05/08/24 0728 05/08/24 1141  GLUCAP 178* 126* 182* 109* 145*    Discharge time spent: greater than 30 minutes.  Signed: Murvin Mana, MD [facility] 05/08/2024

## 2024-05-08 NOTE — Progress Notes (Signed)
 Date of Admission:  05/01/2024    ID: Casey Peters is a 78 y.o. male Principal Problem:   Recurrent fever Active Problems:   Type 2 diabetes mellitus (HCC)   HLD (hyperlipidemia)   Controlled type 2 diabetes mellitus without complication (HCC)   Tobacco use   Hypertriglyceridemia   Elevated liver enzymes   E coli bacteremia   Fever   Leukocytosis   CAD S/P percutaneous coronary angioplasty   Weakness   Chronic diastolic CHF (congestive heart failure) (HCC)    Subjective: Feeling fine No fever  Medications:   amoxicillin   500 mg Oral Q8H   aspirin  EC  81 mg Oral Daily   carvedilol   3.125 mg Oral BID WC   enoxaparin  (LOVENOX ) injection  40 mg Subcutaneous Q24H   feeding supplement  237 mL Oral BID BM   insulin  aspart  0-6 Units Subcutaneous TID WC   pantoprazole   40 mg Oral BID   polyethylene glycol  17 g Oral Daily   silodosin   8 mg Oral Q breakfast   sucralfate   1 g Oral TID WC & HS   testosterone   5 g Transdermal Daily    Objective: Vital signs in last 24 hours: Patient Vitals for the past 24 hrs:  BP Temp Temp src Pulse Resp SpO2  05/08/24 0728 109/69 98.2 F (36.8 C) Oral 70 17 97 %  05/08/24 0405 116/72 98.5 F (36.9 C) Oral 73 20 95 %  05/07/24 2003 104/61 98.4 F (36.9 C) Oral 87 18 93 %  05/07/24 1522 112/61 98.1 F (36.7 C) Oral 73 16 96 %     Lines and Device Date on insertion # of days DC  Chiropractor     Foley     ETT       PHYSICAL EXAM:  sleeping  Lab Results    Latest Ref Rng & Units 05/02/2024    6:21 AM 05/01/2024   10:35 AM 04/28/2024    5:27 AM  CBC  WBC 4.0 - 10.5 K/uL 9.0  14.2  10.3   Hemoglobin 13.0 - 17.0 g/dL 89.0  87.4  88.9   Hematocrit 39.0 - 52.0 % 32.9  36.9  32.8   Platelets 150 - 400 K/uL 340  424  335        Latest Ref Rng & Units 05/06/2024    5:16 AM 05/02/2024    6:21 AM 05/01/2024   10:35 AM  CMP  Glucose 70 - 99 mg/dL  882  829   BUN 8 - 23 mg/dL  14  14   Creatinine 9.38 - 1.24  mg/dL 9.00  9.05  9.00   Sodium 135 - 145 mmol/L  135  133   Potassium 3.5 - 5.1 mmol/L  3.8  4.2   Chloride 98 - 111 mmol/L  101  99   CO2 22 - 32 mmol/L  26  24   Calcium  8.9 - 10.3 mg/dL  8.4  8.6   Total Protein 6.5 - 8.1 g/dL   7.0   Total Bilirubin 0.0 - 1.2 mg/dL   1.2   Alkaline Phos 38 - 126 U/L   130   AST 15 - 41 U/L   19   ALT 0 - 44 U/L   21       Microbiology: Bile fluid culture enterococcus and pseudomonas The latter is MDR organism Studies/Results: No results found.   Assessment/Plan: ?78 yr male with  biliary obstruction has int/ext biliary  drain, recent ecoli bacteremia wa son ertapenem  and was having fever inspite of antibiotic and had been admitted X2   Pseudomonas, enterococcus in the bile culture- there is no wbc in bile- because of recurrent admission for fever and no other cause found , and patient and wife concerned, will treat this for 10-days-as psedudomonas is resistant to all antibiotics except aminoglycoside- will do ceftolozone + tazobactam IV  ( until 8/10) and for enterococcus will do Po amoxicillin  500mg  TID until 8/6   Recent ecoli- completed 14 days treatment with ertapenem  on 7/21 Day 11 of midline-  discuss with IV team about the duration we can keep midline   Biliary obstruction- had ERCP and stent replacement on 04/17/24- The latest CT scan from 7/17 shows the stent to be in the duodenem mostly If he has another fever we will have to repeat the CT Biliary drain present Brushings from the CBD- scant cellularity- no malignancy noted   Anemia   Lfts have normalized    ?CAD s/p CABG   DM   H/o urethral strictures with frequent UTI. Will do a post void bladder scan If fever recurs will need urine culture   Discussed with wife and with patient in detail Discussed with pharmacist and hospitlaist

## 2024-05-08 NOTE — Progress Notes (Addendum)
 PHARMACY CONSULT NOTE FOR:  OUTPATIENT  PARENTERAL ANTIBIOTIC THERAPY (OPAT)  Indication: Cholangitis from biliary obstruction (intra-abdominal infection) with multidrug resistant (difficult to treat) pseudomonas aeruginosa from bile drain Regimen: Zerbaxa  1.5gm IV q8h  End date: 05/21/2024  **Also go home with amoxicillin  500mg  po q8h until 05/17/2024  -Labs - Once weekly:  CBC/D and CMP -Fax weekly lab results  promptly to [phone] -Please pull PICC/midline at completion of IV antibiotics -Call [phone] with any critical values or questions  IV antibiotic discharge orders are pended. To discharging provider:  please sign these orders via discharge navigator,  Select New Orders & click on the button choice - Manage This Unsigned Work.     Thank you for allowing pharmacy to be a part of this patient's care.  Aloria Looper, PharmD, BCPS, BCIDP Work Cell: [phone] 05/08/2024 2:37 PM

## 2024-05-08 NOTE — Discharge Instructions (Addendum)
 Interventional Radiology Percutaneous Drain Placement After Care   This sheet gives you information about how to care for yourself after your procedure. Your health care provider may also give you more specific instructions. Your drain was placed by an interventional radiologist with [facility]. If you have questions or concerns, contact [facility] at [phone].   What is a percutaneous drain?   A drain is a small plastic tube (catheter) that goes into the fluid collection in your body through your skin.   How long will I need the drain?   How long the drain needs to stay in is determined by where the drain is, how much comes out of the drain each day and if you are having any other surgical procedures.   Interventional radiology will determine when it is time to remove the drain. It is important to follow up as directed so that the drain can be removed as soon as it is safe to do so.   What can I expect after the procedure?   After the procedure, it is common to have:   A small amount of bruising and discomfort in the area where the drainage tube (catheter) was placed.   Sleepiness and fatigue. This should go away after the medicines you were given have worn off.   Follow these instructions at home:   Insertion site care   Check your insertion site when you change the bandage. Check for:   More redness, swelling, or pain.   More fluid or blood.   Warmth.   Pus or a bad smell.   When caring for your insertion site:   Wash your hands with soap and water  for at least 20 seconds before and after you change your bandage (dressing). If soap and water  are not available, use hand sanitizer.   You do not need to change your dressing everyday if it is clean and dry. Change your dressing every 3 days or as needed when it is soiled, wet or becoming dislodged. You will need to change your dressing each time you shower.   Leave stitches (sutures), skin glue, or  adhesive strips in place. These skin closures may need to stay in place for 2 weeks or longer. If adhesive strip edges start to loosen and curl up, you may trim the loose edges. Do not remove adhesive strips completely unless your health care provider tells you to do so.   Catheter care   Flush the catheter with 5-10 mL of 0.9% normal saline if you notice any sudden changes in drain output. This helps to prevent clogs in the catheter.   To disconnect the drain, turn the clear plastic tube to the left. Attach the saline syringe by placing it on the white end of the drain and turning gently to the right. Once attached gently push the plunger to the 5 mL mark. After you are done flushing, disconnect the syringe by turning to the left and reattach your drainage container   Check for fluid leaking from around your catheter (instead of fluid draining through your catheter). This may be a sign that the drain is no longer working correctly.   Write down the following information every time you empty your bag:   The date and time.   The amount of drainage.   Activity   Rest at home for 1-2 days after your procedure.   For the first 48 hours do not lift anything more than 10 lbs (about a gallon of  milk). You may perform moderate activities/exercise. Please avoid strenuous activities during this time.   Avoid any activities which may pull on your drain as this can cause your drain to become dislodged.   If you were given a sedative during the procedure, it can affect you for several hours. Do not drive or operate machinery until your health care provider says that it is safe.   General instructions   For mild pain take over-the-counter medications as needed for pain such as Tylenol  or Advil . If you are experiencing severe pain please call our office as this may indicate an issue with your drain.    If you were prescribed an antibiotic medicine, take it as told by your health care provider. Do not  stop using the antibiotic even if you start to feel better.   You may shower with the drain. To do this cover the insertion site with a water  tight material such as saran wrap and seal the edges with tape, you may also purchase waterproof dressings at your local drug store. Shower as usual and then remove the water  tight dressing and any gauze/tape underneath it once you have exited the shower and dried off. Allow the area to air dry or pat dry with a clean towel. Once the skin is completely dry place a new gauze dressing. It is important to keep the site dry at all times to prevent infection.   Do not submerge the drain - this means you cannot take baths, swim, use a hot tub, etc. until the drain is removed.    Do not use any products that contain nicotine  or tobacco, such as cigarettes, e-cigarettes, and chewing tobacco. If you need help quitting, ask your health care provider.   Keep all follow-up visits as told by your health care provider. This is important.   Contact a health care provider if:   You notice a sudden change in output for >24hrs.   You have any of these signs of infection:   More redness, swelling, or pain around your incision area.   More fluid or blood coming from your incision area.   Warmth coming from your incision area.   Pus or a bad smell coming from your incision area.   You have fluid leaking from around your catheter (instead of through your catheter).   You are unable to flush the drain.   You have a fever or chills.   You have pain that does not get better with medicine.   You have not been contacted to schedule a follow up appointment within 6 weeks from your last drain exchange (04/28/24).   Please call [facility] at [phone] with any questions or concerns.   Get help right away if:   Your catheter comes out.   You suddenly stop having drainage from your catheter.   You suddenly have blood in the fluid that is draining from  your catheter.   You become dizzy or you faint.   You develop a rash.   You have nausea or vomiting.   You have difficulty breathing or you feel short of breath.   You develop chest pain.   You have problems with your speech or vision.   You have trouble balancing or moving your arms or legs.   Summary   It is common to have a small amount of bruising and discomfort in the area where the drainage tube (catheter) was placed. You may also have minor discomfort with movement  while the drain is in place.   Flush the drain with 5-10 mL of 0.9% normal saline if you notice any sudden changes in the amount of output in the drain.   Record the amount of drainage from the bag every time you empty it.   Change the dressing every 3 days or earlier if soiled/wet. Keep the skin dry under the dressing.   You may shower with the drain in place. Do not submerge the drain (no baths, swimming, hot tubs, etc.). When you shower, keep the bandage in place and try to minimize the amount of water  touching the bandage. You may also cover the bandage in clear saran wrap to ensure that no water  gets through. After the shower, make sure you place a clean, dry bandage on top of the drain site.   Contact [facility] at [phone] if you have more redness, swelling, or pain around your incision area or if you have pain that does not get better with medicine.   This information is not intended to replace advice given to you by your health care provider. Make sure you discuss any questions you have with your health care provider.

## 2024-05-08 NOTE — Plan of Care (Signed)

## 2024-05-08 NOTE — Plan of Care (Signed)
  Problem: Fluid Volume: Goal: Ability to maintain a balanced intake and output will improve Outcome: Progressing   Problem: Health Behavior/Discharge Planning: Goal: Ability to identify and utilize available resources and services will improve Outcome: Progressing Goal: Ability to manage health-related needs will improve Outcome: Progressing   Problem: Metabolic: Goal: Ability to maintain appropriate glucose levels will improve Outcome: Progressing   Problem: Nutritional: Goal: Maintenance of adequate nutrition will improve Outcome: Progressing   Problem: Skin Integrity: Goal: Risk for impaired skin integrity will decrease Outcome: Progressing   Problem: Clinical Measurements: Goal: Will remain free from infection Outcome: Progressing   Problem: Coping: Goal: Level of anxiety will decrease Outcome: Progressing

## 2024-05-08 NOTE — Progress Notes (Signed)
 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 Pleasant and agreeable to OT session. He denies pain. Pt performed x10 STS/chair push ups with supervision. Additional standing exercises performed at supervision level with RW. Wife present and educated on all exercises, as well as use of DME/AE/AD and task simplification to perform ADLs safely at home. Pt required supervision for LB dressing.  Pt returned to recliner with all needs in place and will cont to require skilled acute OT services to maximize his safety and IND to return to PLOF.       If plan is discharge home, recommend the following:  A little help with walking and/or transfers;A little help with bathing/dressing/bathroom;Assist for transportation;Help with stairs or ramp for entrance;Assistance with cooking/housework   Equipment Recommendations  None recommended by OT    Recommendations for Other Services      Precautions / Restrictions Precautions Precautions: Fall Recall of Precautions/Restrictions: Intact Restrictions Weight Bearing Restrictions Per Provider Order: No       Mobility Bed Mobility               General bed mobility comments: NT in chair pre/post session    Transfers Overall transfer level: Needs assistance Equipment used: Rolling walker (2 wheels) Transfers: Sit to/from Stand Sit to Stand: Supervision           General transfer comment: supervision to stand  from recliner 10 reps during session     Balance                                           ADL either performed or assessed with clinical judgement   ADL Overall ADL's : Needs assistance/impaired                     Lower Body Dressing: Supervision/safety Lower Body Dressing Details (indicate cue type and reason): to don pants seated in recliner               General ADL Comments: no trouble noted with LB dressing this date, able to stand from recliner with supervision x10 reps during session    Extremity/Trunk Assessment              Vision       Perception     Praxis     Communication Communication Communication: No apparent difficulties   Cognition Arousal: Alert Behavior During Therapy: Flat affect                                 Following commands: Intact        Cueing   Cueing Techniques: Verbal cues, Gestural cues, Visual cues  Exercises Other Exercises Other Exercises: edu pt/spouse on DME/AE/AD for safe transition home    Shoulder Instructions       General Comments  (R  UQ Biliary tube intact)    Pertinent Vitals/ Pain       Pain Assessment Pain Assessment: No/denies pain Pain Intervention(s): Monitored during session, Repositioned  Home Living                                          Prior Functioning/Environment              Frequency  Min 2X/week        Progress Toward Goals  OT Goals(current goals can now be found in the care plan section)     Acute Rehab OT Goals Patient Stated Goal: go home OT Goal Formulation: With patient Time For Goal Achievement: 05/18/24 Potential to Achieve Goals: Good  Plan      Co-evaluation                 AM-PAC OT 6 Clicks Daily Activity     Outcome Measure                    End of Session Equipment Utilized During Treatment: Rolling walker (2 wheels)  OT Visit Diagnosis: Unsteadiness on feet  (R26.81);Other abnormalities of gait and mobility (R26.89);Muscle weakness (generalized) (M62.81)   Activity Tolerance Patient tolerated treatment well   Patient Left in chair;with call bell/phone within reach;with family/visitor present   Nurse Communication Mobility status        Time: 8975-8955 OT Time Calculation (min): 20 min  Charges: OT General Charges $OT Visit: 1 Visit OT Treatments $Therapeutic Exercise: 8-22 mins  Casey Peters, OTR/L  05/08/24, 2:00 PM   Casey Peters 05/08/2024, 1:50 PM

## 2024-05-09 LAB — GLUCOSE, CAPILLARY: Glucose-Capillary: 114 mg/dL — ABNORMAL HIGH (ref 70–99)

## 2024-05-09 NOTE — Progress Notes (Signed)
 Patient d/c order placed. In speaking with wife, wife endorses concerns about the IV medication administration process and has several questions regarding administration. Attempted to contact [facility] RN liaison x 3 with no answer. Per pharmacist, Infusion RN to contact patient's wife after providing education to another patient. No call as of end of shift. Night shift nurse and charge nurse, as well as unit Facilities manager, made aware of situation and barrier to discharge.

## 2024-05-09 NOTE — Plan of Care (Signed)
  Problem: Coping: Goal: Ability to adjust to condition or change in health will improve Outcome: Progressing   Problem: Skin Integrity: Goal: Risk for impaired skin integrity will decrease Outcome: Progressing   Problem: Elimination: Goal: Will not experience complications related to urinary retention Outcome: Progressing   Problem: Pain Managment: Goal: General experience of comfort will improve and/or be controlled Outcome: Progressing

## 2024-05-09 NOTE — Progress Notes (Signed)
 Patient was discharged yesterday, but not able to transport home.  He left before I was able to see him today. Please refer to yesterday discharge summary for details.

## 2024-05-10 ENCOUNTER — Telehealth: Payer: Self-pay

## 2024-05-10 NOTE — Telephone Encounter (Signed)
 Received voicemail from Devonne with [facility] requesting updated OPAT orders from most recent hospitalization.   Orders available in ID note from 7/28.  Message sent to Holley Herring, RN with [facility] to ensure that both they and [facility] have updated orders.   Devonne [phone]  Meta Kroenke, BSN, RN

## 2024-05-10 NOTE — Telephone Encounter (Signed)
 Received message from [facility] that she will reach out to [facility] with [facility] to ensure that they have orders.   Brittnie Lewey, BSN, RN

## 2024-05-12 LAB — MINIMUM INHIBITORY CONC. (2 DRUGS)

## 2024-05-12 LAB — MIC RESULTS (2 DRUGS)

## 2024-05-16 LAB — BODY FLUID CULTURE W GRAM STAIN: Gram Stain: NONE SEEN

## 2024-05-16 LAB — MISC LABCORP TEST (SEND OUT): Labcorp test code: 9985

## 2024-05-18 NOTE — Progress Notes (Addendum)
 Image storage order placed for:  04/28/2024  IR Exchange bilary drain  04/27/2024  CT Abdomen/Pelvis  04/27/2024  US  Abdomen Limited  04/26/2024  DG Chest IV Port  04/18/2024  CT Angio Chest  03/16/2024  IT Biliary Drain Placement w/cholangiogram  03/10/2024  CT Abd/Pelvis    Addendum to add order for [facility] Interpretation of CT scan per Dr. Caryle request  Additionally requested storage of  04/12/2024  PET scan 03/14/2024 MRI/MRCP abdomen  Allean Mallick, FNP-C Department of Surgery  Abdominal Transplant Division

## 2024-05-20 ENCOUNTER — Telehealth: Payer: Self-pay | Admitting: Internal Medicine

## 2024-05-20 NOTE — Telephone Encounter (Signed)
 Received call from Casey Peters at [place].  Midline is coming out, nonfunctional today.  He received a partial dose of iv  antibiotics today.  Eot for IV therapy(zerbaxa ) as 8/10.  Counseled that okay to stop IV antibiotics and remove midline.  He has ID follow-up with Dr. Fayette on 8/14.

## 2024-05-22 ENCOUNTER — Telehealth: Payer: Self-pay

## 2024-05-22 NOTE — Telephone Encounter (Signed)
 Call patient to schedule appt for drain evaluation/exchange per Dr Philip from (05/20/24) - Pt said that he didn't want to be seen today because he has an appt at [facility] tomorrow. He will call back if he needs to be seen after that appt.

## 2024-05-23 ENCOUNTER — Inpatient Hospital Stay: Admitting: Infectious Diseases

## 2024-05-25 ENCOUNTER — Ambulatory Visit: Attending: Infectious Diseases | Admitting: Infectious Diseases

## 2024-05-25 ENCOUNTER — Other Ambulatory Visit: Payer: Self-pay

## 2024-05-25 ENCOUNTER — Other Ambulatory Visit: Payer: Self-pay | Admitting: Radiology

## 2024-05-25 ENCOUNTER — Telehealth: Payer: Self-pay | Admitting: Radiology

## 2024-05-25 VITALS — BP 121/80 | HR 91 | Temp 98.4°F

## 2024-05-25 DIAGNOSIS — D649 Anemia, unspecified: Secondary | ICD-10-CM | POA: Diagnosis not present

## 2024-05-25 DIAGNOSIS — B952 Enterococcus as the cause of diseases classified elsewhere: Secondary | ICD-10-CM

## 2024-05-25 DIAGNOSIS — B9620 Unspecified Escherichia coli [E. coli] as the cause of diseases classified elsewhere: Secondary | ICD-10-CM | POA: Insufficient documentation

## 2024-05-25 DIAGNOSIS — I509 Heart failure, unspecified: Secondary | ICD-10-CM | POA: Insufficient documentation

## 2024-05-25 DIAGNOSIS — K831 Obstruction of bile duct: Secondary | ICD-10-CM

## 2024-05-25 DIAGNOSIS — R7989 Other specified abnormal findings of blood chemistry: Secondary | ICD-10-CM | POA: Insufficient documentation

## 2024-05-25 DIAGNOSIS — Z7984 Long term (current) use of oral hypoglycemic drugs: Secondary | ICD-10-CM | POA: Insufficient documentation

## 2024-05-25 DIAGNOSIS — I110 Hypertensive heart disease with heart failure: Secondary | ICD-10-CM | POA: Insufficient documentation

## 2024-05-25 DIAGNOSIS — R7881 Bacteremia: Secondary | ICD-10-CM | POA: Diagnosis not present

## 2024-05-25 DIAGNOSIS — Z951 Presence of aortocoronary bypass graft: Secondary | ICD-10-CM | POA: Diagnosis not present

## 2024-05-25 DIAGNOSIS — B965 Pseudomonas (aeruginosa) (mallei) (pseudomallei) as the cause of diseases classified elsewhere: Secondary | ICD-10-CM

## 2024-05-25 DIAGNOSIS — I2510 Atherosclerotic heart disease of native coronary artery without angina pectoris: Secondary | ICD-10-CM | POA: Diagnosis not present

## 2024-05-25 DIAGNOSIS — E119 Type 2 diabetes mellitus without complications: Secondary | ICD-10-CM | POA: Diagnosis not present

## 2024-05-25 DIAGNOSIS — Z9689 Presence of other specified functional implants: Secondary | ICD-10-CM | POA: Diagnosis not present

## 2024-05-25 DIAGNOSIS — K8309 Other cholangitis: Secondary | ICD-10-CM | POA: Diagnosis not present

## 2024-05-25 MED ORDER — SODIUM CHLORIDE FLUSH 0.9 % IV SOLN
10.0000 mL | INTRAVENOUS | 3 refills | Status: AC | PRN
  Filled 2024-05-25: qty 300, 30d supply, fill #0

## 2024-05-25 NOTE — Telephone Encounter (Signed)
 Received a phone call from infectious disease doctor requesting I speak with the patient and his wife regarding drain concerns. Had an extensive conversation with the patient's wife over the phone regarding her husband's biliary drain. She reports over the weekend patient had no output from his drain. They called the IR department and were instructed to flush the drain once daily. Wife began flushing the drain and reports improved drainage, however is concerned because her husband is reporting worsening abdominal pain since output has increased. States that his pain is currently a 2-3/10. He has also been having fevers for the past 2 nights. First fever noted on 8/12 was ~102, second fever on 8/13 was ~100. Wife reports patient has no fevers during the day, only at night. Discussed with wife that should his fevers persist throughout the day and his abdominal pain worsen, they should consider evaluation in the ED. Wife verbalized understanding.   She also reports they saw a biliary doctor at [facility] who told her she needed to be flushing the drain 3 times daily, not just once. Discussed with wife that they can continue to flush the drain once daily with normal saline to prevent catheter from becoming clogged. She is agreeable to this plan, but is requesting more supplies. States that his home health nurse is able to bring more supplies, but needs an order. She provided the name and number of the home health nurse they have been working with.   I spoke with the nurse over the phone and discussed updated at home care instructions as well as supplies they will need to continue with regular dressing changes and daily flushes. Nurse Jamee verbalized understanding and will place refill orders for patient supplies.   Wife is incredibly anxious regarding caring for her husbands drain. I reassured her that they are doing all of the right things. Patient is due for an exchange in the next few weeks. Informed wife that they  should be hearing from our schedulers soon to set up that appointment. Wife verbalized understanding is agreeable to the plan moving forward. All questions and concerns answered.   Electronically Signed: Salam Micucci M Akio Hudnall, PA-C 05/25/2024, 12:45 PM

## 2024-05-25 NOTE — Patient Instructions (Addendum)
 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 Speak to intervention radiologist regarding flushes

## 2024-05-25 NOTE — Progress Notes (Signed)
 NAME: Casey Peters  DOB: [birth year]  MRN: [id number]  Date/Time: 05/25/2024 9:05 AM   Subjective:   Follow-up after recent hospitalization between 05/01/2024 until 05/08/2024. He is here with his wife.    Casey Peters is a 78 y.o. male with a history of Diabetes mellitus, CAD status post CABG, hypertension, has been in hospital many times  since June 2025 for abnormal LFTs and bilirubin of 16, HE initially  MRI MRCP and it had shown biliary dilatation , underwent ERCP on 03/15/2024.  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 Jinny.  Brushings were sent for cytology.  And a plastic stent was placed.  The pathology of the brushings showed atypical cells and malignancy could not be established.  He also had to undergo an external/internal biliary drain placement because of persistently high bilirubin.  This was done on 03/16/2024.  No culture was sent from the fluid at that time.  He had a high CA of 19. Patient on 04/17/2024 underwent ERCP and stent replacement with repeat brushings sent for cytology.  He had a fever after that and was hospitalized 04/17/2024 until 04/24/2024.  I saw him during that admission for E. coli bacteremia.  He was discharged on IV ertapenem  until 05/01/2024.  But he was readmitted on 04/26/2024 with fever at home.  CT abdomen on 04/27/2024 showed stable positioning of the right sided internal/external biliary drainage catheter.  There was no evidence of biliary obstruction with decompressed right-sided biliary ducts and chronic atrophy of the left lobe.  The previously noted old endoscopic biliary stent had migrated further and it was now almost entirely in the duodenum with a short segment remaining in the distal CBD.  He had biliary drain exchanged on 7/18 and brushings were sent..  Blood culture was negative.  He was discharged on 7/18 and asked to continue the ertapenem .  He got readmitted on 721 with  fever at home as high as 102 as per his wife.  During that hospitalization biliary fluid from the new drain was sent for culture and it grew enterococcus and Pseudomonas. The latter was MDR (including  resistant to meropenem  ).  I saw him for the infection.  He did not have any fever in the hospital- LFTS were near normal .He was discharged home on IV Zerbaxa  for a total of 2 weeks until 05/21/2024 and also on amoxicillin  500 mg p.o. 3 times daily until 05/17/2024.  On 05/20/2024 the midline got displaced and the antibiotics were stopped a day earlier..  Today he is here for follow-up After his discharge he went to [facility] on 05/23/2024 for a second opinion and saw  transplant surgeon they did a  cancer antigen 19-9 and it was elevated at 940.  They already have ordered CAT scan cirrhosis abdomen pelvis with contrast protocol.  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 Today he is here for follow-up As per patient he is doing okay . his wife thinks he may be having fevers. I asked her to get a new digital thermometer to record his temperature.  Wife was also concerned that his recent stay in the hospital was denied by insurance Patient will have to contact accounting/billing to discuss this issue  Wife had question about flushing the external/internal drain. She has to contact the interventional radiology department. They had called her 2 days to schedule an appointment but they decided they will contact them they after the [facility] visit. After the  Past Medical History:  Diagnosis Date   Cardiomegaly    CHF (congestive heart failure) (HCC)    Constipation    Coronary artery disease    Diabetic polyneuropathy (HCC)    Dilated idiopathic cardiomyopathy (HCC)    Erectile dysfunction    HLD (hyperlipidemia)    HTN (hypertension)    Low testosterone     Palpitations    Pneumonia    T2DM (type 2 diabetes mellitus) (HCC)     Past Surgical History:  Procedure Laterality Date   APPENDECTOMY      CORONARY/GRAFT ACUTE MI REVASCULARIZATION N/A 12/27/2021   Procedure: Coronary/Graft Acute MI Revascularization;  Surgeon: Mady Bruckner, MD;  Location: [facility] INVASIVE CV LAB;  Service: Cardiovascular;  Laterality: N/A;   CYSTOSCOPY WITH URETHRAL DILATATION N/A 06/17/2021   Procedure: CYSTOSCOPY WITH URETHRAL DILATATION;  Surgeon: Twylla Glendia BROCKS, MD;  Location: [facility] ORS;  Service: Urology;  Laterality: N/A;  Optilume Balloon   ERCP N/A 03/15/2024   Procedure: ERCP, WITH INTERVENTION IF INDICATED;  Surgeon: Jinny Carmine, MD;  Location: [facility] ENDOSCOPY;  Service: Endoscopy;  Laterality: N/A;   ERCP N/A 04/17/2024   Procedure: ERCP, WITH INTERVENTION IF INDICATED;  Surgeon: Jinny Carmine, MD;  Location: [facility] ENDOSCOPY;  Service: Endoscopy;  Laterality: N/A;   HERNIA REPAIR  1998   IR BILIARY DRAIN PLACEMENT WITH CHOLANGIOGRAM  03/16/2024   IR EXCHANGE BILIARY DRAIN  04/28/2024   LEFT HEART CATH AND CORONARY ANGIOGRAPHY N/A 12/27/2021   Procedure: LEFT HEART CATH AND CORONARY ANGIOGRAPHY;  Surgeon: Mady Bruckner, MD;  Location: [facility] INVASIVE CV LAB;  Service: Cardiovascular;  Laterality: N/A;   PILONIDAL CYST EXCISION     TONSILLECTOMY     URETHRA SURGERY  1995    Social History   Socioeconomic History   Marital status: Married    Spouse name: Not on file   Number of children: Not on file   Years of education: Not on file   Highest education level: Not on file  Occupational History   Not on file  Tobacco Use   Smoking status: Every Day    Types: Cigars   Smokeless tobacco: Never   Tobacco comments:    2-3 cigars per day  Vaping Use   Vaping status: Never Used  Substance and Sexual Activity   Alcohol use: No    Alcohol/week: 0.0 standard drinks of alcohol   Drug use: No   Sexual activity: Yes    Birth control/protection: None  Other Topics Concern   Not on file  Social History Narrative   Not on file   Social Drivers of Health   Financial Resource Strain: Low Risk   (05/12/2024)   Received from [facility]   Overall Financial Resource Strain (CARDIA)    Difficulty of Paying Living Expenses: Not hard at all  Food Insecurity: No Food Insecurity (05/12/2024)   [facility]   Hunger Vital Sign    Within the past 12 months, you worried that your food would run out before you got the money to buy more.: Never true    Within the past 12 months, the food you bought just didn't last and you didn't have money to get more.: Never true  Transportation Needs: No Transportation Needs (05/12/2024)   Received from [facility] - Transportation    In the past 12 months, has lack of transportation kept you from medical appointments or from getting medications?: No    Lack  of Transportation (Non-Medical): No  Physical Activity: Not on file  Stress: Not on file  Social Connections: Socially Integrated (05/01/2024)   Social Connection and Isolation Panel    Frequency of Communication with Friends and Family: More than three times a week    Frequency of Social Gatherings with Friends and Family: Twice a week    Attends Religious Services: More than 4 times per year    Active Member of [employer] or Organizations: Yes    Attends Engineer, structural: More than 4 times per year    Marital Status: Married  Catering manager Violence: Not At Risk (05/01/2024)   Humiliation, Afraid, Rape, and Kick questionnaire    Fear of Current or Ex-Partner: No    Emotionally Abused: No    Physically Abused: No    Sexually Abused: No    Family History  Problem Relation Age of Onset   Hematuria Father    Kidney disease Neg Hx    Prostate cancer Neg Hx    Allergies  Allergen Reactions   Citalopram Nausea And Vomiting    Dizziness, unsteady, balanced issues   Sulfamethoxazole -Trimethoprim      Fatigue and muscle ache Other reaction(s): Other (See Comments) Fatigue and muscle ache   Trimethoprim  Other (See  Comments)    Fatigue, hypotension   I? Current Outpatient Medications  Medication Sig Dispense Refill   alum & mag hydroxide-simeth (MAALOX/MYLANTA) 200-200-20 MG/5ML suspension Take 15 mLs by mouth every 6 (six) hours as needed for indigestion or heartburn. 355 mL 0   aspirin  EC 81 MG tablet Take 81 mg by mouth daily.     carvedilol  (COREG ) 6.25 MG tablet Take 6.25 mg by mouth 2 (two) times daily with a meal.     furosemide  (LASIX ) 40 MG tablet Take 40 mg by mouth daily as needed.     glimepiride (AMARYL) 2 MG tablet Take 2 mg by mouth daily as needed.     JANUVIA 50 MG tablet Take 1 tablet by mouth daily.     ondansetron  (ZOFRAN ) 8 MG tablet One pill every 8 hours as needed for nausea/vomitting. 60 tablet 1   oxyCODONE  (OXY IR/ROXICODONE ) 5 MG immediate release tablet Take 1 tablet (5 mg total) by mouth every 8 (eight) hours as needed for severe pain (pain score 7-10). 30 tablet 0   pantoprazole  (PROTONIX ) 40 MG tablet Take 1 tablet (40 mg total) by mouth 2 (two) times daily for 30 days, THEN 1 tablet (40 mg total) daily. 90 tablet 0   silodosin  (RAPAFLO ) 8 MG CAPS capsule Take 1 capsule (8 mg total) by mouth daily with breakfast. 90 capsule 3   spironolactone  (ALDACTONE ) 25 MG tablet Take 12.5 mg by mouth daily.     testosterone  (ANDROGEL ) 50 MG/5GM (1%) GEL Place 5 g onto the skin daily. Please confirm with your cardiologist before resuming     Vitamin D , Ergocalciferol , (DRISDOL ) 1.25 MG (50000 UNIT) CAPS capsule Take 1 capsule (50,000 Units total) by mouth every 7 (seven) days. 12 capsule 0   No current facility-administered medications for this visit.     Abtx:  Anti-infectives (From admission, onward)    None       REVIEW OF SYSTEMS:  Const: fever, negative chills, negative weight loss Eyes: negative diplopia or visual changes, negative eye pain ENT: negative coryza, negative sore throat Resp: negative cough, hemoptysis, dyspnea Cards: negative for chest pain,  palpitations, lower extremity edema GU: negative for frequency, dysuria and hematuria GI: Negative for abdominal  pain, diarrhea, bleeding, constipation Skin: negative for rash and pruritus Heme: negative for easy bruising and gum/nose bleeding MS: weakness in wheelchair Neurolo:negative for headaches, dizziness, vertigo, memory problems  Psych: negative for feelings of anxiety, depression  Endocrine:  diabetes Allergy/Immunology-sulfa  and citalopram  VITALS:  BP 121/80   Pulse 91   Temp 98.4 F (36.9 C) (Oral)   SpO2 93%  LDA Right upper quadrant drain PHYSICAL EXAM:  General: Alert, cooperative, chronically ill no weak l, no distress Head: Normocephalic, without obvious abnormality, atraumatic. Eyes: No icterus ENT Nares normal. No drainage or sinus tenderness. Lips, mucosa, and tongue normal. No Thrush Neck:  symmetrical, no adenopathy, thyroid: non tender no carotid bruit and no JVD. Lungs: Bilateral air entry Decreased in the bases Heart: Irregular Abdomen: Soft, upper quadrant drain.  Bilious fluid  extremities: atraumatic, no cyanosis. No edema. No clubbing Skin: No rashes or lesions. Or bruising Lymph: Cervical, supraclavicular normal. Neurologic: Tremors upper extremity  grossly non-focal Pertinent Labs Lab Results CBC    Component Value Date/Time   WBC 9.0 05/02/2024 0621   RBC 3.55 (L) 05/02/2024 0621   HGB 10.9 (L) 05/02/2024 0621   HCT 32.9 (L) 05/02/2024 0621   PLT 340 05/02/2024 0621   MCV 92.7 05/02/2024 0621   MCH 30.7 05/02/2024 0621   MCHC 33.1 05/02/2024 0621   RDW 15.0 05/02/2024 0621   LYMPHSABS 1.4 04/26/2024 1602   MONOABS 0.8 04/26/2024 1602   EOSABS 0.1 04/26/2024 1602   BASOSABS 0.1 04/26/2024 1602       Latest Ref Rng & Units 05/06/2024    5:16 AM 05/02/2024    6:21 AM 05/01/2024   10:35 AM  CMP  Glucose 70 - 99 mg/dL  882  829   BUN 8 - 23 mg/dL  14  14   Creatinine 9.38 - 1.24 mg/dL 9.00  9.05  9.00   Sodium 135 - 145 mmol/L   135  133   Potassium 3.5 - 5.1 mmol/L  3.8  4.2   Chloride 98 - 111 mmol/L  101  99   CO2 22 - 32 mmol/L  26  24   Calcium  8.9 - 10.3 mg/dL  8.4  8.6   Total Protein 6.5 - 8.1 g/dL   7.0   Total Bilirubin 0.0 - 1.2 mg/dL   1.2   Alkaline Phos 38 - 126 U/L   130   AST 15 - 41 U/L   19   ALT 0 - 44 U/L   21     ? Impression/Recommendation 78 year old male with biliary obstruction, very likely due to malignancy and  he has internal/ext biliary drain  ?Biliary obstruction secondary to possible malignancy.  Stent placement initially in June and then replaced on 04/17/2024 and the latest CT scan from 04/27/2024 shows a stent to be in duodenum mostly. Patient is seen [facility] transplant surgeon and awaiting her CT abdomen.  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  Completed antibiotics for that.  Taken Zerbaxa  for 9 days intravenously and p.o. amoxicillin   Recent E. coli bacteremia and he has completed 14 days of appropriate antibiotic.  Anemia  Diabetes mellitus management as per his PCP  History of urethral strictures.  Patient had issues with billing.  So gave him information about patient accounting and billing .  He will talk to interventional radiologist regarding maintenance of his drain  To get  a new thermometer so as to get reliable readings.  Will see him as needed.  Discussed the management in great detail with the patient and his wife.  And answered all their questions  ________________________________________________  Note:  This document was prepared using Dragon voice recognition software and may include unintentional dictation errors.

## 2024-06-05 ENCOUNTER — Other Ambulatory Visit: Payer: Self-pay

## 2024-06-13 ENCOUNTER — Other Ambulatory Visit: Payer: Self-pay | Admitting: Radiology

## 2024-06-13 ENCOUNTER — Telehealth: Payer: Self-pay

## 2024-06-13 NOTE — Telephone Encounter (Signed)
 Pt's spouse, Clarita called to cancel ERCP for 07/11/2024... She stated that she was told that sue to the stent moving, you stated that a repeat ERCP is not needed... Please confirm this and I will cancel procedure for 9/30

## 2024-06-14 NOTE — Progress Notes (Signed)
 Patient for IR Biliary Drain exchange on Thurs 06/15/24, I called and spoke with the patient on the phone and gave pre-procedure instructions. Pt was made aware to be here at 1p and check in at the new entrance. Pt stated understanding. Called 06/14/24

## 2024-06-14 NOTE — Telephone Encounter (Signed)
 I called endo to cancel ERCP

## 2024-06-15 ENCOUNTER — Ambulatory Visit
Admission: RE | Admit: 2024-06-15 | Discharge: 2024-06-15 | Disposition: A | Discharge status: Home / Self Care | Source: Ambulatory Visit | Attending: Radiology | Admitting: Radiology

## 2024-06-15 DIAGNOSIS — Z4803 Encounter for change or removal of drains: Secondary | ICD-10-CM | POA: Diagnosis present

## 2024-06-15 DIAGNOSIS — K831 Obstruction of bile duct: Secondary | ICD-10-CM | POA: Insufficient documentation

## 2024-06-15 HISTORY — PX: IR EXCHANGE BILIARY DRAIN: IMG6046

## 2024-06-15 MED ORDER — IOHEXOL 300 MG/ML  SOLN
10.0000 mL | Freq: Once | INTRAMUSCULAR | Status: AC | PRN
  Administered 2024-06-15: 10 mL

## 2024-06-15 MED ORDER — LIDOCAINE HCL 1 % IJ SOLN
INTRAMUSCULAR | Status: AC
Start: 2024-06-15 — End: 2024-06-15
  Filled 2024-06-15: qty 20

## 2024-06-15 MED ORDER — LIDOCAINE HCL 1 % IJ SOLN
5.0000 mL | Freq: Once | INTRAMUSCULAR | Status: AC
  Administered 2024-06-15: 5 mL via INTRADERMAL

## 2024-06-19 ENCOUNTER — Ambulatory Visit
Admission: RE | Admit: 2024-06-19 | Discharge: 2024-06-19 | Disposition: A | Discharge status: Home / Self Care | Source: Ambulatory Visit | Attending: Radiology | Admitting: Radiology

## 2024-06-19 ENCOUNTER — Other Ambulatory Visit: Payer: Self-pay | Admitting: Radiology

## 2024-06-19 ENCOUNTER — Telehealth: Payer: Self-pay | Admitting: Radiology

## 2024-06-19 DIAGNOSIS — K831 Obstruction of bile duct: Secondary | ICD-10-CM

## 2024-06-19 HISTORY — PX: IR RADIOLOGIST EVAL & MGMT: IMG5224

## 2024-06-19 NOTE — Progress Notes (Signed)
 Brief Interventional Radiology Note:   78 year old male with a history of biliary obstruction secondary to hilar mass with concern for cholangiocarcinoma. Patient recently underwent drain exchange on 9/4 with Dr. KANDICE Moan. Wife called this morning with concerns that he was draining too much bile through his gravity bag and that patient had been intermittently hypotensive over the weekend. Recommended that patient and Clarita come to hospital to have drain capped and discuss return precautions.  IR Tech [employer] and myself met with patient and his wife for evaluation. Drainage bag attached and ~100cc bilious output in bag. Patient and wife have concerns that the fluid appears different than prior to exchange. Discussed with patient that it is normal for the appearance of the fluid can fluctuate and is not cause for concern. However, we do recommend capping the drain given the large volume he is draining per day (1.3L or more based on Janet's log) and recent hypotension.   Showed Clarita how to keep drain appropriately capped at home. Also discussed that should the patient develop symptoms such as fever/chills, nausea/vomiting, RUQ or epigastric pain that she should remove the cap and attach a drainage bag. If this occurs she should also reach out to our team for any additional recommendations. Patient and wife both verbalized understanding and are agreeable to this plan.   Clarita reports being told she needs to flush the catheter 1-3 times daily previously. Discussed with her that she no longer needs to do this as the drain is fully internalized. However, should Mr. Jentz become symptomatic, they re-attach the drainage bag, and he continues to have no output from the drain, she should attempt to flush the drain and call IR if she has any difficulty doing so. Clarita verbalized understanding.   Drain exchange to be ordered for 6-8 weeks. Patient and Clarita provided with additional drainage bag should  they need it during that time frame. All questions and concerns answered during the visit.   Patient and wife encouraged to reach out with any additional questions or concerns as they arise.   Electronically Signed: Advit Trethewey M Vedanth Sirico, PA-C 06/19/2024, 2:35 PM

## 2024-06-19 NOTE — Telephone Encounter (Signed)
 Received a phone call from patient's wife, Clarita, stating that she had concerns about his new drain placement. States that patient's biliary drain was exchanged on 9/4 and that since this occurred he has been draining ~2L or more daily. Wife states that prior to the drain exchange last Thursday, he would drain ~500-712mL daily. Since the increase in biliary output, patient's blood pressure has started to drop to mid 90s/60s. Wife states that she spoke with her husband's PCP who recommended clamping the drain and increasing patient's fluid intake to improve BP. Since doing this his pressure has increased, most recently, to 116/79. However, wife is concerned that patient's drain needs to be replaced.   Discussed with patient's wife that his drain likely needs to be capped, not replaced. Wife states that they have not been told or shown how to do this. Scheduled appointment for patient to be seen this afternoon at 2:00pm for biliary drain to be capped and new bag to be given to patient and wife should they need it. Discussed with wife that she should leave the patient's drain clamped until he is able to be seen this afternoon.   Patient and wife were informed to arrive by 1:45 for their 2:00pm appointment. Wife verbalized understanding.   Electronically Signed: Sharnell Knight M Mayanna Garlitz, PA-C 06/19/2024, 9:07 AM

## 2024-06-26 ENCOUNTER — Telehealth: Payer: Self-pay | Admitting: Radiology

## 2024-06-26 ENCOUNTER — Other Ambulatory Visit: Payer: Self-pay | Admitting: Radiology

## 2024-06-26 ENCOUNTER — Ambulatory Visit
Admission: RE | Admit: 2024-06-26 | Discharge: 2024-06-26 | Disposition: A | Discharge status: Home / Self Care | Source: Ambulatory Visit | Attending: Radiology | Admitting: Radiology

## 2024-06-26 DIAGNOSIS — K831 Obstruction of bile duct: Secondary | ICD-10-CM

## 2024-06-26 HISTORY — PX: IR CHOLANGIOGRAM EXISTING TUBE: IMG6040

## 2024-06-26 MED ORDER — IOHEXOL 300 MG/ML  SOLN
4.0000 mL | Freq: Once | INTRAMUSCULAR | Status: AC | PRN
  Administered 2024-06-26: 4 mL

## 2024-06-26 NOTE — Telephone Encounter (Addendum)
 Patient's wife, Clarita, called with concerns about her husband's biliary drain leaking. States that during the past week, he started having some bile leak around the drain insertion site and noted that surrounding skin was becoming erythematous. She called on Friday and spoke with Dr. Karalee who recommended uncapping the drain and attaching to gravity bag to allow some fluid to drain out (max of 2L) and should he continue to have drainage from insertion site to come in for further evaluation. Wife reports doing this and have approximately of bile come out.   Today she says that the insertion site is less erythematous and that the drain has had less leakage from the insertion site; however, any time that he rolls towards his right or leans to his right at all, bile will start coming out from around the drain site.   Discussed with the wife that they can come in later today for a drain injection to ensure appropriate positioning of drain. Patient and wife verbalized understanding. Clarita Ricker to call with appointment time.   Electronically Signed: Rocio Roam M Junior Huezo, PA-C 06/26/2024, 10:19 AM

## 2024-07-05 ENCOUNTER — Telehealth: Payer: Self-pay | Admitting: *Deleted

## 2024-07-05 ENCOUNTER — Other Ambulatory Visit: Payer: Self-pay

## 2024-07-05 DIAGNOSIS — R978 Other abnormal tumor markers: Secondary | ICD-10-CM

## 2024-07-05 DIAGNOSIS — G893 Neoplasm related pain (acute) (chronic): Secondary | ICD-10-CM

## 2024-07-05 NOTE — Telephone Encounter (Signed)
 Patient's wife called to request a follow up visit with Dr. Amey.

## 2024-07-05 NOTE — Telephone Encounter (Signed)
 Labs in

## 2024-07-11 ENCOUNTER — Ambulatory Visit: Admit: 2024-07-11 | Admitting: Gastroenterology

## 2024-07-11 SURGERY — ERCP, WITH INTERVENTION IF INDICATED
Anesthesia: General

## 2024-07-12 ENCOUNTER — Other Ambulatory Visit: Payer: Self-pay | Admitting: Interventional Radiology

## 2024-07-12 DIAGNOSIS — K831 Obstruction of bile duct: Secondary | ICD-10-CM

## 2024-07-13 ENCOUNTER — Other Ambulatory Visit: Payer: Self-pay | Admitting: Radiology

## 2024-07-13 ENCOUNTER — Ambulatory Visit
Admission: RE | Admit: 2024-07-13 | Discharge: 2024-07-13 | Disposition: A | Discharge status: Home / Self Care | Source: Ambulatory Visit | Attending: Urology | Admitting: Urology

## 2024-07-13 DIAGNOSIS — K831 Obstruction of bile duct: Secondary | ICD-10-CM

## 2024-07-13 MED ORDER — SODIUM CHLORIDE 0.9 % IV SOLN
2.0000 g | INTRAVENOUS | Status: DC
  Filled 2024-07-13: qty 20

## 2024-07-13 MED ORDER — SODIUM CHLORIDE 0.9 % IV SOLN: INTRAVENOUS | Status: DC

## 2024-07-14 ENCOUNTER — Other Ambulatory Visit: Payer: Self-pay | Admitting: Radiology

## 2024-07-14 DIAGNOSIS — K831 Obstruction of bile duct: Secondary | ICD-10-CM

## 2024-07-19 ENCOUNTER — Inpatient Hospital Stay: Attending: Internal Medicine

## 2024-07-19 ENCOUNTER — Other Ambulatory Visit: Payer: Self-pay | Admitting: Infectious Diseases

## 2024-07-19 ENCOUNTER — Ambulatory Visit: Admitting: Internal Medicine

## 2024-07-19 ENCOUNTER — Encounter: Payer: Self-pay | Admitting: Internal Medicine

## 2024-07-19 ENCOUNTER — Other Ambulatory Visit

## 2024-07-19 ENCOUNTER — Inpatient Hospital Stay (HOSPITAL_BASED_OUTPATIENT_CLINIC_OR_DEPARTMENT_OTHER): Admitting: Internal Medicine

## 2024-07-19 VITALS — BP 130/79 | HR 84 | Temp 101.0°F | Resp 18 | Ht 71.0 in | Wt 188.0 lb

## 2024-07-19 DIAGNOSIS — Z7982 Long term (current) use of aspirin: Secondary | ICD-10-CM | POA: Diagnosis not present

## 2024-07-19 DIAGNOSIS — I2510 Atherosclerotic heart disease of native coronary artery without angina pectoris: Secondary | ICD-10-CM | POA: Insufficient documentation

## 2024-07-19 DIAGNOSIS — R5383 Other fatigue: Secondary | ICD-10-CM | POA: Insufficient documentation

## 2024-07-19 DIAGNOSIS — Z87440 Personal history of urinary (tract) infections: Secondary | ICD-10-CM | POA: Insufficient documentation

## 2024-07-19 DIAGNOSIS — I420 Dilated cardiomyopathy: Secondary | ICD-10-CM | POA: Insufficient documentation

## 2024-07-19 DIAGNOSIS — Z79890 Hormone replacement therapy (postmenopausal): Secondary | ICD-10-CM | POA: Diagnosis not present

## 2024-07-19 DIAGNOSIS — Z955 Presence of coronary angioplasty implant and graft: Secondary | ICD-10-CM | POA: Insufficient documentation

## 2024-07-19 DIAGNOSIS — Z881 Allergy status to other antibiotic agents status: Secondary | ICD-10-CM | POA: Diagnosis not present

## 2024-07-19 DIAGNOSIS — R509 Fever, unspecified: Secondary | ICD-10-CM | POA: Insufficient documentation

## 2024-07-19 DIAGNOSIS — K831 Obstruction of bile duct: Secondary | ICD-10-CM | POA: Diagnosis not present

## 2024-07-19 DIAGNOSIS — N400 Enlarged prostate without lower urinary tract symptoms: Secondary | ICD-10-CM | POA: Insufficient documentation

## 2024-07-19 DIAGNOSIS — R531 Weakness: Secondary | ICD-10-CM | POA: Insufficient documentation

## 2024-07-19 DIAGNOSIS — K838 Other specified diseases of biliary tract: Secondary | ICD-10-CM

## 2024-07-19 DIAGNOSIS — Z79899 Other long term (current) drug therapy: Secondary | ICD-10-CM | POA: Diagnosis not present

## 2024-07-19 DIAGNOSIS — Z8701 Personal history of pneumonia (recurrent): Secondary | ICD-10-CM | POA: Insufficient documentation

## 2024-07-19 DIAGNOSIS — I509 Heart failure, unspecified: Secondary | ICD-10-CM | POA: Diagnosis not present

## 2024-07-19 DIAGNOSIS — Z882 Allergy status to sulfonamides status: Secondary | ICD-10-CM | POA: Insufficient documentation

## 2024-07-19 DIAGNOSIS — G893 Neoplasm related pain (acute) (chronic): Secondary | ICD-10-CM

## 2024-07-19 DIAGNOSIS — F17290 Nicotine dependence, other tobacco product, uncomplicated: Secondary | ICD-10-CM | POA: Diagnosis not present

## 2024-07-19 DIAGNOSIS — Z9089 Acquired absence of other organs: Secondary | ICD-10-CM | POA: Insufficient documentation

## 2024-07-19 DIAGNOSIS — R110 Nausea: Secondary | ICD-10-CM | POA: Insufficient documentation

## 2024-07-19 DIAGNOSIS — Z832 Family history of diseases of the blood and blood-forming organs and certain disorders involving the immune mechanism: Secondary | ICD-10-CM | POA: Insufficient documentation

## 2024-07-19 DIAGNOSIS — C221 Intrahepatic bile duct carcinoma: Secondary | ICD-10-CM

## 2024-07-19 DIAGNOSIS — R634 Abnormal weight loss: Secondary | ICD-10-CM | POA: Diagnosis not present

## 2024-07-19 DIAGNOSIS — R918 Other nonspecific abnormal finding of lung field: Secondary | ICD-10-CM | POA: Insufficient documentation

## 2024-07-19 DIAGNOSIS — R978 Other abnormal tumor markers: Secondary | ICD-10-CM | POA: Insufficient documentation

## 2024-07-19 DIAGNOSIS — A689 Relapsing fever, unspecified: Secondary | ICD-10-CM

## 2024-07-19 DIAGNOSIS — Z9049 Acquired absence of other specified parts of digestive tract: Secondary | ICD-10-CM | POA: Insufficient documentation

## 2024-07-19 MED ORDER — VITAMIN D (ERGOCALCIFEROL) 1.25 MG (50000 UNIT) PO CAPS: 50000.0000 [IU] | ORAL_CAPSULE | ORAL | 0 refills | Status: AC

## 2024-07-19 NOTE — Progress Notes (Signed)
 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   HISTORY OF PRESENTING ILLNESS: Patient ambulating-in wheel chair with assistance.  Accompanied by wife.  Casey Peters 78 y.o.  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  In the interim patient was evaluated at [facility] surgery-felt to be high risk for liver resection.  Patient also not inclined for any surgical options.  Patient continues to have low-grade fevers-status post evaluation with ID.  Suspected tumor fevers.  Patient continues to feel weak.  Resting most of the time in the chair out of bed.  As per the wife had issues with biliary drainage currently resolved.  Review of Systems  Constitutional:  Positive for fever, malaise/fatigue and weight loss. Negative for chills and diaphoresis.  HENT:  Negative for nosebleeds and sore throat.   Eyes:  Negative for double vision.  Respiratory:  Negative for cough, hemoptysis, sputum production, shortness of breath and wheezing.   Cardiovascular:  Negative for chest pain, palpitations, orthopnea and leg swelling.  Gastrointestinal:  Positive for nausea. Negative for abdominal pain, blood in stool, constipation, diarrhea, heartburn, melena and vomiting.  Genitourinary:  Negative for dysuria, frequency and urgency.  Musculoskeletal:  Negative for back pain and joint pain.  Skin: Negative.  Negative for itching and rash.  Neurological:  Negative for dizziness, tingling, focal weakness, weakness and headaches.   Endo/Heme/Allergies:  Does not bruise/bleed easily.  Psychiatric/Behavioral:  Negative for depression. The patient is not nervous/anxious and does not have insomnia.     MEDICAL HISTORY:  Past Medical History:  Diagnosis Date   Cardiomegaly    CHF (congestive heart failure) (HCC)    Constipation    Coronary artery disease    Diabetic polyneuropathy (HCC)    Dilated idiopathic cardiomyopathy (HCC)    Erectile dysfunction    HLD (hyperlipidemia)    HTN (hypertension)    Low testosterone     Palpitations    Pneumonia    T2DM (type 2 diabetes mellitus) (HCC)     SURGICAL HISTORY: Past Surgical History:  Procedure Laterality Date   APPENDECTOMY     CORONARY/GRAFT ACUTE MI REVASCULARIZATION N/A 12/27/2021   Procedure: Coronary/Graft Acute MI Revascularization;  Surgeon: Mady Bruckner, MD;  Location: [facility] INVASIVE CV LAB;  Service: Cardiovascular;  Laterality: N/A;   CYSTOSCOPY WITH URETHRAL DILATATION N/A 06/17/2021   Procedure: CYSTOSCOPY WITH URETHRAL DILATATION;  Surgeon: Twylla Glendia BROCKS, MD;  Location: [facility] ORS;  Service: Urology;  Laterality: N/A;  Optilume Balloon   ERCP N/A 03/15/2024   Procedure: ERCP, WITH INTERVENTION IF INDICATED;  Surgeon: Jinny Carmine, MD;  Location: [facility] ENDOSCOPY;  Service: Endoscopy;  Laterality: N/A;   ERCP N/A 04/17/2024   Procedure: ERCP, WITH INTERVENTION IF INDICATED;  Surgeon: Jinny Carmine, MD;  Location: [facility] ENDOSCOPY;  Service: Endoscopy;  Laterality: N/A;   HERNIA REPAIR  1998   IR BILIARY DRAIN PLACEMENT WITH CHOLANGIOGRAM  03/16/2024   IR CHOLANGIOGRAM EXISTING TUBE  06/26/2024   IR EXCHANGE BILIARY DRAIN  04/28/2024   IR EXCHANGE BILIARY DRAIN  06/15/2024   IR RADIOLOGIST EVAL & MGMT  06/19/2024   LEFT HEART CATH  AND CORONARY ANGIOGRAPHY N/A 12/27/2021   Procedure: LEFT HEART CATH AND CORONARY ANGIOGRAPHY;  Surgeon: Mady Bruckner, MD;  Location: [facility] INVASIVE CV LAB;  Service: Cardiovascular;  Laterality: N/A;   PILONIDAL CYST  EXCISION     TONSILLECTOMY     URETHRA SURGERY  1995    SOCIAL HISTORY: Social History   Socioeconomic History   Marital status: Married    Spouse name: Not on file   Number of children: Not on file   Years of education: Not on file   Highest education level: Not on file  Occupational History   Not on file  Tobacco Use   Smoking status: Every Day    Types: Cigars   Smokeless tobacco: Never   Tobacco comments:    2-3 cigars per day  Vaping Use   Vaping status: Never Used  Substance and Sexual Activity   Alcohol use: No    Alcohol/week: 0.0 standard drinks of alcohol   Drug use: No   Sexual activity: Yes    Birth control/protection: None  Other Topics Concern   Not on file  Social History Narrative   Not on file   Social Drivers of Health   Financial Resource Strain: Low Risk  (06/08/2024)   Received from [facility]   Overall Financial Resource Strain (CARDIA)    Difficulty of Paying Living Expenses: Not hard at all  Food Insecurity: No Food Insecurity (06/08/2024)   Received [facility]   Hunger Vital Sign    Within the past 12 months, you worried that your food would run out before you got the money to buy more.: Never true    Within the past 12 months, the food you bought just didn't last and you didn't have money to get more.: Never true  Transportation Needs: No Transportation Needs (06/08/2024)   Received from [facility] - Transportation    In the past 12 months, has lack of transportation kept you from medical appointments or from getting medications?: No    Lack of Transportation (Non-Medical): No  Physical Activity: Not on file  Stress: Not on file  Social Connections: Socially Integrated (05/01/2024)   Social Connection and Isolation Panel    Frequency of Communication with Friends and Family: More than three times a week    Frequency of Social Gatherings with Friends and Family: Twice a  week    Attends Religious Services: More than 4 times per year    Active Member of [employer] or Organizations: Yes    Attends Engineer, structural: More than 4 times per year    Marital Status: Married  Catering manager Violence: Not At Risk (05/01/2024)   Humiliation, Afraid, Rape, and Kick questionnaire    Fear of Current or Ex-Partner: No    Emotionally Abused: No    Physically Abused: No    Sexually Abused: No    FAMILY HISTORY: Family History  Problem Relation Age of Onset   Hematuria Father    Kidney disease Neg Hx    Prostate cancer Neg Hx     ALLERGIES:  is allergic to citalopram, sulfamethoxazole -trimethoprim , and trimethoprim .  MEDICATIONS:  Current Outpatient Medications  Medication Sig Dispense Refill   alum & mag hydroxide-simeth (MAALOX/MYLANTA) 200-200-20 MG/5ML suspension Take 15 mLs by mouth every 6 (six) hours as needed for indigestion or heartburn. 355 mL 0   aspirin  EC 81 MG tablet Take 81 mg by mouth daily.  carvedilol  (COREG ) 6.25 MG tablet Take 6.25 mg by mouth 2 (two) times daily with a meal.     furosemide  (LASIX ) 40 MG tablet Take 40 mg by mouth daily as needed.     glimepiride (AMARYL) 2 MG tablet Take 2 mg by mouth daily as needed.     JANUVIA 50 MG tablet Take 1 tablet by mouth daily.     ondansetron  (ZOFRAN ) 8 MG tablet One pill every 8 hours as needed for nausea/vomitting. 60 tablet 1   oxyCODONE  (OXY IR/ROXICODONE ) 5 MG immediate release tablet Take 1 tablet (5 mg total) by mouth every 8 (eight) hours as needed for severe pain (pain score 7-10). 30 tablet 0   pantoprazole  (PROTONIX ) 40 MG tablet Take 1 tablet (40 mg total) by mouth 2 (two) times daily for 30 days, THEN 1 tablet (40 mg total) daily. 90 tablet 0   sodium chloride  flush 0.9 % SOLN injection 10 mLs by Intracatheter route as needed. Flush biliary drain cathter with 5-14mL of saline daily to prevent clogging 300 mL 3   spironolactone  (ALDACTONE ) 25 MG tablet Take 12.5 mg by mouth  daily.     testosterone  (ANDROGEL ) 50 MG/5GM (1%) GEL Place 5 g onto the skin daily. Please confirm with your cardiologist before resuming     Vitamin D , Ergocalciferol , (DRISDOL ) 1.25 MG (50000 UNIT) CAPS capsule Take 1 capsule (50,000 Units total) by mouth every 7 (seven) days. 12 capsule 0   silodosin  (RAPAFLO ) 8 MG CAPS capsule Take 1 capsule (8 mg total) by mouth daily with breakfast. (Patient not taking: Reported on 07/19/2024) 90 capsule 3   No current facility-administered medications for this visit.    PHYSICAL EXAMINATION:   Vitals:   07/19/24 1500 07/19/24 1521  BP: (!) 147/78 130/79  Pulse: 84   Resp: 18   Temp: (!) 101 F (38.3 C)   SpO2: 96%    Filed Weights   07/19/24 1500  Weight: 188 lb (85.3 kg)   Mild yellowish discoloration.  Appears weak.  Biliary drain in place.  Physical Exam Vitals and nursing note reviewed.  HENT:     Head: Normocephalic and atraumatic.     Mouth/Throat:     Pharynx: Oropharynx is clear.  Eyes:     Extraocular Movements: Extraocular movements intact.     Pupils: Pupils are equal, round, and reactive to light.  Cardiovascular:     Rate and Rhythm: Normal rate and regular rhythm.  Pulmonary:     Comments: Decreased breath sounds bilaterally.  Abdominal:     Palpations: Abdomen is soft.  Musculoskeletal:        General: Normal range of motion.     Cervical back: Normal range of motion.  Skin:    General: Skin is warm.  Neurological:     General: No focal deficit present.     Mental Status: He is alert and oriented to person, place, and time.  Psychiatric:        Behavior: Behavior normal.        Judgment: Judgment normal.     LABORATORY DATA:  I have reviewed the data as listed Lab Results  Component Value Date   WBC 9.0 05/02/2024   HGB 10.9 (L) 05/02/2024   HCT 32.9 (L) 05/02/2024   MCV 92.7 05/02/2024   PLT 340 05/02/2024   Recent Labs    04/22/24 0316 04/23/24 0249 04/24/24 0707 04/26/24 1602  04/27/24 0426 04/28/24 0527 05/01/24 1035 05/02/24 0621 05/06/24 0516  NA 139  137 138   < > 135 137 133* 135  --   K 4.0 3.6 3.8   < > 3.3* 3.5 4.2 3.8  --   CL 101 105 102   < > 101 104 99 101  --   CO2 28 27 26    < > 25 26 24 26   --   GLUCOSE 157* 117* 121*   < > 116* 104* 170* 117*  --   BUN 20 17 16    < > 14 13 14 14   --   CREATININE 1.18 1.11 0.95   < > 0.90 0.85 0.99 0.94 0.99  CALCIUM  8.2* 7.6* 8.1*   < > 7.8* 8.0* 8.6* 8.4*  --   GFRNONAA >60 >60 >60   < > >60 >60 >60 >60 >60  PROT 5.7* 5.7* 5.6*   < > 5.6* 5.7* 7.0  --   --   ALBUMIN 2.3* 2.2* 2.3*   < > 2.3* 2.4* 2.8*  --   --   AST 43* 25 23   < > 21 18 19   --   --   ALT 44 33 31   < > 34 27 21  --   --   ALKPHOS 94 81 106   < > 145* 123 130*  --   --   BILITOT 1.6* 1.1 1.2   < > 1.6* 1.0 1.2  --   --   BILIDIR 0.6* 0.5* 0.5*  --   --   --   --   --   --   IBILI 1.0* 0.6 0.7  --   --   --   --   --   --    < > = values in this interval not displayed.    RADIOGRAPHIC STUDIES: I have personally reviewed the radiological images as listed and agreed with the findings in the report. IR CHOLANGIOGRAM EXISTING TUBE Result Date: 06/26/2024 INDICATION: Right internal external biliary drain, biliary obstruction, leakage at the skin site EXAM: FLUOROSCOPIC INJECTION OF THE RIGHT INTERNAL EXTERNAL BILIARY DRAIN Date:  06/26/2024 06/26/2024 2:43 pm Radiologist:  CHRISTELLA. Frederic Specking, MD Guidance:  Fluoroscopic FLUOROSCOPY: 0 minutes 18 seconds (3.0 mGy). MEDICATIONS: None. ANESTHESIA/SEDATION: None. CONTRAST:  4mL OMNIPAQUE  IOHEXOL  300 MG/ML  SOLN COMPLICATIONS: None immediate. PROCEDURE: Informed consent was obtained from the patient following explanation of the procedure, risks, benefits and alternatives. The patient understands, agrees and consents for the procedure. All questions were addressed. A time out was performed. Maximal barrier sterile technique utilized including caps, mask, sterile gowns, sterile gloves, large sterile drape, hand  hygiene, and ChloraPrep. Under sterile conditions the existing 12 French right internal external biliary drain was injected with contrast. Fluoroscopic imaging performed. Stable drain catheter position. Drain remains patent with contrast opacifying the irregular residual CBD. Contrast flows freely into the duodenum. Endoscopic biliary stent again noted, unchanged in position. IMPRESSION: 1. Stable patent right internal external biliary drain. Electronically Signed   By: CHRISTELLA.  Shick M.D.   On: 06/26/2024 15:05     Elevated tumor markers # June 2025[facility] [elevated bilirubin]- MRI/MRCP-  Moderate intrahepatic bile duct dilation with abrupt tapering at the level of the hepatic hilum. No discrete filling defect, obstructing lesion, or signal abnormality-differential diagnosis includes- benign stricture or cholangiocarcinoma. A 2.2 cm mildly T2 hyperintense, enhancing focus along the medial posterior spleen is subtly seen in retrospect on prior CT examinations and only minimally increased in size, likely benign S/p ERCP [Dr.Wohl] gastroenterology-cytology atypical; IR-status post PTC-atypical  cells.  CA 19-9 >900.  July 2025 PET scan: Focal area of uptake identified in the hilum of the liver at the location of strictures seen by previous imaging consistent with area of neoplasm. No other areas of abnormal uptake at this time suggest additional areas of disease.  # clinically suspicious for hilar cholangiocarcinoma even though never proven malignancy so far.  Status post evaluation at [facility]-thought to be high risk for surgery and patient also declined any further surgical evaluation.  # I had a long discussion the patient and wife regarding-in general rather poor responses to chemotherapy.  Given the hilar mass-clinically less likelihood of having any targeted therapy options.  Discussed the potential side effects of chemotherapy include nausea vomiting diarrhea neuropathy with advanced response rates around  30%-40%.  Given the patient's performance status between 3-4-and rather chemo insensitive disease-I think patient's benefits from the chemotherapy are outweighed by the risk of the chemotherapy.  Based on imaging if any intrahepatic lesions noted could potentially consider radioembolization Will discuss with radiology once CT scan results are available.  Discussed with Kristi Stanton.  # Recurrent fevers-likely tumor fevers.  Agree with Dr. Epifanio recommendation of NSAIDs-ibuprofen /Tylenol .  Again await CT scan as ordered.  # Biliary obstruction status post indwelling right hepatic lobe PTC and endoscopic left-sided stent.  Followed by IR.    # CAD/ Cardiomyopathy - s/p stenting - Dr.Callwood], reasonably stable.   # Discussed overall poor prognosis given patient's difficult situation-discussed hospice as a potential option.  Patient/family say that they have enough support from home health at this time.  However his condition continues to worsen-I think hospice would be very reasonable.  Discussed with Dr. Epifanio and Rayfield Jasmine.  # DISPOSITION: # NO labs-  # follow up TBD- Dr.B  # I reviewed the blood work- with the patient in detail; also reviewed the imaging independently [as summarized above]; and with the patient in detail.   # 40 minutes face-to-face with the patient discussing the above plan of care; more than 50% of time spent on prognosis/ natural history; counseling and coordination.   Above plan of care was discussed with patient/family in detail.  My contact information was given to the patient/family.     Cindy JONELLE Joe, MD 07/19/2024 4:18 PM

## 2024-07-19 NOTE — Assessment & Plan Note (Addendum)
#   June 2025[facility] [elevated bilirubin]- MRI/MRCP-  Moderate intrahepatic bile duct dilation with abrupt tapering at the level of the hepatic hilum. No discrete filling defect, obstructing lesion, or signal abnormality-differential diagnosis includes- benign stricture or cholangiocarcinoma. A 2.2 cm mildly T2 hyperintense, enhancing focus along the medial posterior spleen is subtly seen in retrospect on prior CT examinations and only minimally increased in size, likely benign S/p ERCP [Dr.Wohl] gastroenterology-cytology atypical; IR-status post PTC-atypical cells.  CA 19-9 >900.  July 2025 PET scan: Focal area of uptake identified in the hilum of the liver at the location of strictures seen by previous imaging consistent with area of neoplasm. No other areas of abnormal uptake at this time suggest additional areas of disease.  # clinically suspicious for hilar cholangiocarcinoma even though never proven malignancy so far.  Status post evaluation at [facility]-thought to be high risk for surgery and patient also declined any further surgical evaluation.  # I had a long discussion the patient and wife regarding-in general rather poor responses to chemotherapy.  Given the hilar mass-clinically less likelihood of having any targeted therapy options.  Discussed the potential side effects of chemotherapy include nausea vomiting diarrhea neuropathy with advanced response rates around 30%-40%.  Given the patient's performance status between 3-4-and rather chemo insensitive disease-I think patient's benefits from the chemotherapy are outweighed by the risk of the chemotherapy.  Based on imaging if any intrahepatic lesions noted could potentially consider radioembolization Will discuss with radiology once CT scan results are available.  Discussed with Kristi Stanton.  # Recurrent fevers-likely tumor fevers.  Agree with Dr. Epifanio recommendation of NSAIDs-ibuprofen /Tylenol .  Again await CT scan as ordered.  # Biliary  obstruction status post indwelling right hepatic lobe PTC and endoscopic left-sided stent.  Followed by IR.    # CAD/ Cardiomyopathy - s/p stenting - Dr.Callwood], reasonably stable.   # Discussed overall poor prognosis given patient's difficult situation-discussed hospice as a potential option.  Patient/family say that they have enough support from home health at this time.  However his condition continues to worsen-I think hospice would be very reasonable.  Discussed with Dr. Epifanio and Rayfield Jasmine.  # DISPOSITION: # NO labs-  # follow up TBD- Dr.B  # I reviewed the blood work- with the patient in detail; also reviewed the imaging independently [as summarized above]; and with the patient in detail.   # 40 minutes face-to-face with the patient discussing the above plan of care; more than 50% of time spent on prognosis/ natural history; counseling and coordination.

## 2024-07-19 NOTE — Progress Notes (Signed)
 C/o fever at home 100.5 since 06/14/24. Does he need to keep taking the weekly vit d? If so, he needs refill, pended.  In hospital 05/08/24 for fever and 04/28/24 for sepsis and fever. 04/24/24 hospital for sepsis. Seen at [facility] since last visit. PCP ordered culture labs. Seen by Infectious Disease yesterday, Dr. Epifanio.

## 2024-07-20 ENCOUNTER — Other Ambulatory Visit: Payer: Self-pay

## 2024-07-27 ENCOUNTER — Inpatient Hospital Stay

## 2024-07-27 ENCOUNTER — Ambulatory Visit
Admission: RE | Admit: 2024-07-27 | Discharge: 2024-07-27 | Disposition: A | Discharge status: Home / Self Care | Source: Ambulatory Visit | Attending: Infectious Diseases | Admitting: Infectious Diseases

## 2024-07-27 DIAGNOSIS — A689 Relapsing fever, unspecified: Secondary | ICD-10-CM | POA: Insufficient documentation

## 2024-07-27 DIAGNOSIS — R509 Fever, unspecified: Secondary | ICD-10-CM | POA: Diagnosis present

## 2024-07-27 DIAGNOSIS — K838 Other specified diseases of biliary tract: Secondary | ICD-10-CM | POA: Diagnosis present

## 2024-07-27 MED ORDER — IOHEXOL 300 MG/ML  SOLN
100.0000 mL | Freq: Once | INTRAMUSCULAR | Status: AC | PRN
  Administered 2024-07-27: 100 mL via INTRAVENOUS

## 2024-08-04 ENCOUNTER — Telehealth: Payer: Self-pay

## 2024-08-04 NOTE — Telephone Encounter (Signed)
 Spoke with Clarita regarding tumor board outcome. She provides update that she saw her PCP, Dr. Rudolpho, today and she has decided to accept hospice. The referral was sent and hospice may be coming out today for initial visit. I have encouraged her to reach out if anything is needed from us .

## 2024-08-04 NOTE — Telephone Encounter (Signed)
 Called and left voicemail with spouse, Clarita, to return call to discuss outcome from tumor board. No areas in/around liver are amendable to radioembolization.

## 2024-08-10 NOTE — Progress Notes (Signed)
 Patient for IR Biliary Drain Exchange on Friday 08/11/24, I called and spoke with the patient's wife, Casey Peters on the phone and gave pre-procedure instructions. Casey Peters was made aware to have the patient here at 9a and check in at the H&V entrance. Casey Peters stated understanding. Called 08/07/24

## 2024-08-11 ENCOUNTER — Ambulatory Visit
Admission: RE | Admit: 2024-08-11 | Discharge: 2024-08-11 | Disposition: A | Discharge status: Home / Self Care | Source: Ambulatory Visit | Attending: Radiology | Admitting: Radiology

## 2024-08-11 DIAGNOSIS — Z434 Encounter for attention to other artificial openings of digestive tract: Secondary | ICD-10-CM | POA: Insufficient documentation

## 2024-08-11 DIAGNOSIS — K831 Obstruction of bile duct: Secondary | ICD-10-CM | POA: Insufficient documentation

## 2024-08-11 HISTORY — PX: IR INT EXT BILIARY DRAIN WITH CHOLANGIOGRAM: IMG6044

## 2024-08-11 MED ORDER — LIDOCAINE-EPINEPHRINE 1 %-1:100000 IJ SOLN
20.0000 mL | Freq: Once | INTRAMUSCULAR | Status: AC
  Administered 2024-08-11: 10 mL via INTRADERMAL

## 2024-08-11 MED ORDER — IOHEXOL 300 MG/ML  SOLN
50.0000 mL | Freq: Once | INTRAMUSCULAR | Status: AC | PRN
  Administered 2024-08-11: 20 mL

## 2024-08-11 MED ORDER — LIDOCAINE-EPINEPHRINE 1 %-1:100000 IJ SOLN
INTRAMUSCULAR | Status: AC
  Filled 2024-08-11: qty 1

## 2024-09-21 ENCOUNTER — Other Ambulatory Visit: Payer: Self-pay | Admitting: Radiology

## 2024-09-21 DIAGNOSIS — K831 Obstruction of bile duct: Secondary | ICD-10-CM

## 2024-10-11 ENCOUNTER — Other Ambulatory Visit: Payer: Self-pay | Admitting: Radiology

## 2024-10-11 ENCOUNTER — Other Ambulatory Visit: Payer: Self-pay

## 2024-10-11 ENCOUNTER — Ambulatory Visit
Admission: RE | Admit: 2024-10-11 | Discharge: 2024-10-11 | Disposition: A | Discharge status: Home / Self Care | Source: Ambulatory Visit | Attending: Radiology | Admitting: Radiology

## 2024-10-11 DIAGNOSIS — K831 Obstruction of bile duct: Secondary | ICD-10-CM

## 2024-10-11 DIAGNOSIS — Z4803 Encounter for change or removal of drains: Secondary | ICD-10-CM | POA: Insufficient documentation

## 2024-10-11 HISTORY — PX: IR EXCHANGE BILIARY DRAIN: IMG6046

## 2024-10-11 MED ORDER — IOHEXOL 300 MG/ML  SOLN
4.0000 mL | Freq: Once | INTRAMUSCULAR | Status: AC | PRN
  Administered 2024-10-11: 4 mL

## 2024-10-11 MED ORDER — LIDOCAINE HCL 1 % IJ SOLN
1.0000 mL | Freq: Once | INTRAMUSCULAR | Status: AC
  Administered 2024-10-11: 1 mL via INTRADERMAL

## 2024-10-11 MED ORDER — LIDOCAINE HCL 1 % IJ SOLN
INTRAMUSCULAR | Status: AC
  Filled 2024-10-11: qty 20

## 2024-10-11 NOTE — Procedures (Signed)
 Pre procedural Dx: Routine exchange, biliary drain Post procedural Dx: Same  Technically successful fluoro guided int/ext drainage catheter exchange.        EBL: Trace Complications: None immediate  Casey Banner, MD Pager #: [phone]

## 2024-12-11 ENCOUNTER — Ambulatory Visit: Payer: Medicare PPO | Admitting: Physician Assistant
